# Patient Record
Sex: Male | Born: 1935 | Race: White | Hispanic: No | Marital: Married | Smoking: Never smoker
Health system: Southern US, Community
[De-identification: ages and names within clinical notes are randomized; demographics above are authoritative.]

## PROBLEM LIST (undated history)

## (undated) DIAGNOSIS — R413 Other amnesia: Secondary | ICD-10-CM

## (undated) DIAGNOSIS — J849 Interstitial pulmonary disease, unspecified: Secondary | ICD-10-CM

## (undated) DIAGNOSIS — G459 Transient cerebral ischemic attack, unspecified: Secondary | ICD-10-CM

## (undated) DIAGNOSIS — I2510 Atherosclerotic heart disease of native coronary artery without angina pectoris: Secondary | ICD-10-CM

## (undated) DIAGNOSIS — I10 Essential (primary) hypertension: Secondary | ICD-10-CM

## (undated) DIAGNOSIS — F0280 Dementia in other diseases classified elsewhere without behavioral disturbance: Secondary | ICD-10-CM

## (undated) DIAGNOSIS — I480 Paroxysmal atrial fibrillation: Secondary | ICD-10-CM

## (undated) DIAGNOSIS — Z9989 Dependence on other enabling machines and devices: Secondary | ICD-10-CM

## (undated) DIAGNOSIS — I429 Cardiomyopathy, unspecified: Secondary | ICD-10-CM

## (undated) DIAGNOSIS — I472 Ventricular tachycardia: Secondary | ICD-10-CM

## (undated) DIAGNOSIS — I951 Orthostatic hypotension: Secondary | ICD-10-CM

## (undated) DIAGNOSIS — Z9981 Dependence on supplemental oxygen: Secondary | ICD-10-CM

## (undated) DIAGNOSIS — N183 Chronic kidney disease, stage 3 unspecified: Secondary | ICD-10-CM

## (undated) DIAGNOSIS — I4729 Other ventricular tachycardia: Secondary | ICD-10-CM

## (undated) DIAGNOSIS — R55 Syncope and collapse: Secondary | ICD-10-CM

## (undated) DIAGNOSIS — I428 Other cardiomyopathies: Secondary | ICD-10-CM

## (undated) DIAGNOSIS — G309 Alzheimer's disease, unspecified: Secondary | ICD-10-CM

## (undated) DIAGNOSIS — I509 Heart failure, unspecified: Secondary | ICD-10-CM

## (undated) HISTORY — DX: Atherosclerotic heart disease of native coronary artery without angina pectoris: I25.10

## (undated) HISTORY — DX: Paroxysmal atrial fibrillation: I48.0

## (undated) HISTORY — DX: Cardiomyopathy, unspecified: I42.9

## (undated) HISTORY — PX: LOOP RECORDER IMPLANT: SHX5954

## (undated) HISTORY — DX: Essential (primary) hypertension: I10

## (undated) HISTORY — PX: CATARACT EXTRACTION: SUR2

## (undated) HISTORY — PX: COLONOSCOPY: SHX174

---

## 2012-05-01 ENCOUNTER — Encounter: Payer: Self-pay | Admitting: Cardiology

## 2012-05-22 ENCOUNTER — Other Ambulatory Visit (HOSPITAL_COMMUNITY): Payer: Self-pay | Admitting: Cardiovascular Disease

## 2012-05-22 DIAGNOSIS — I519 Heart disease, unspecified: Secondary | ICD-10-CM

## 2012-06-01 ENCOUNTER — Ambulatory Visit (HOSPITAL_COMMUNITY): Payer: Self-pay

## 2012-06-01 ENCOUNTER — Ambulatory Visit (HOSPITAL_COMMUNITY)
Admission: RE | Admit: 2012-06-01 | Discharge: 2012-06-01 | Disposition: A | Discharge status: Home / Self Care | Payer: Medicare Other | Source: Ambulatory Visit | Attending: Cardiovascular Disease | Admitting: Cardiovascular Disease

## 2012-06-01 DIAGNOSIS — I519 Heart disease, unspecified: Secondary | ICD-10-CM | POA: Insufficient documentation

## 2012-06-01 NOTE — Progress Notes (Signed)
[facility]   2D echo completed 06/01/2012.   Cindy Suriah Peragine, RDCS   

## 2013-05-02 ENCOUNTER — Telehealth: Payer: Self-pay | Admitting: Cardiovascular Disease

## 2013-05-02 ENCOUNTER — Encounter: Payer: Self-pay | Admitting: Cardiovascular Disease

## 2013-05-02 ENCOUNTER — Ambulatory Visit (INDEPENDENT_AMBULATORY_CARE_PROVIDER_SITE_OTHER): Payer: Medicare Other | Admitting: Cardiovascular Disease

## 2013-05-02 VITALS — BP 122/96 | HR 77 | Ht 68.5 in | Wt 176.0 lb

## 2013-05-02 DIAGNOSIS — E785 Hyperlipidemia, unspecified: Secondary | ICD-10-CM

## 2013-05-02 DIAGNOSIS — Z79899 Other long term (current) drug therapy: Secondary | ICD-10-CM

## 2013-05-02 DIAGNOSIS — I10 Essential (primary) hypertension: Secondary | ICD-10-CM

## 2013-05-02 MED ORDER — LISINOPRIL 10 MG PO TABS: 10.0000 mg | ORAL_TABLET | Freq: Every day | ORAL | Status: DC

## 2013-05-02 NOTE — Telephone Encounter (Signed)
Calling back regarding pharmacy  [facility]  [phone].  Samara Deist requested this info

## 2013-05-02 NOTE — Telephone Encounter (Signed)
Returning your call. °

## 2013-05-02 NOTE — Patient Instructions (Signed)
  Your physician wants you to follow-up with him in : 1 year with Dr Allyson Sabal                                            and with our pharmacist, Belenda Cruise, in 1 month for your blood pressure follow up                     You will receive a reminder letter in the mail one month in advance. If you don't receive a letter, please call our office to schedule the follow-up appointment.   Your physician recommends that you return for lab work in: 3 weeks, fasting   Your physician has recommended you make the following change in your medication: restart lisinopril 10mg  daily

## 2013-05-02 NOTE — Assessment & Plan Note (Signed)
Patient was on lisinopril 10 mg last year. His prescription expired and he failed to renew this. His blood pressure is elevated today at 122/96. He does complain of chronic dizziness and disequilibrium which I do not think is related to a cardiovascular cause. #3 start his lisinopril check a BMET in 3 weeks. He'll followup with our [facility], in one month for blood pressure and laboratory check.

## 2013-05-02 NOTE — Telephone Encounter (Signed)
Message forwarded to K. Vogel, RN.  

## 2013-05-02 NOTE — Telephone Encounter (Signed)
Prescription called into [facility] in [place]

## 2013-05-02 NOTE — Progress Notes (Signed)
05/02/2013 Alejandro [name]   April 23, 1933  [id number]  Primary Physician Ignatius Specking., MD Primary Cardiologist: Runell Gess MD Alejandro [name]   HPI:  The patient is a delightful 79 year old, fit appearing married Caucasian male whose wife is also a patient of mine. He is the father of 2, grandfather to 6 grandchildren. 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 He was referred to me by Dr. Sherril Croon because of presyncope and LV dysfunction on Lexiscan Myoview stress testing.   His cardiac risk factor profile is positive for recently diagnosed and treated hypertension. His father died of an MI in his 48s. He does not smoke, drink, nor is he diabetic or hyperlipidemic. He has never had a heart attack or stroke and denies chest pain or shortness of breath. He had 2 episodes of dizziness for which he went to [facility]. He stayed 3 hours and left and went to see Dr. Sherril Croon several days later who ordered an echo and a Myoview. The Myoview showed an EF of 30% and the echo showed an EF of 40-45% with inferior wall motion abnormality. He denies chest pain or shortness of breath. His major complaint is a chronic chronic dizziness at this point..    Current Outpatient Prescriptions  Medication Sig Dispense Refill  . ciprofloxacin (CIPRO) 250 MG tablet Take 250 mg by mouth 2 (two) times daily. Twice a day for 7 days then 1 tablet daily.  Just started this on 04/30/13      . lisinopril (PRINIVIL,ZESTRIL) 10 MG tablet Take 1 tablet (10 mg total) by mouth daily.  90 tablet  3   No current facility-administered medications for this visit.    Not on File  History   Social History  . Marital Status: Married    Spouse Name: N/A    Number of Children: N/A  . Years of Education: N/A   Occupational History  . Not on file.   Social History Main Topics  . Smoking status: Never Smoker   . Smokeless tobacco: Not on file  . Alcohol Use:  Not on file  . Drug Use: Not on file  . Sexual Activity: Not on file   Other Topics Concern  . Not on file   Social History Narrative  . No narrative on file     Review of Systems: General: negative for chills, fever, night sweats or weight changes.  Cardiovascular: negative for chest pain, dyspnea on exertion, edema, orthopnea, palpitations, paroxysmal nocturnal dyspnea or shortness of breath Dermatological: negative for rash Respiratory: negative for cough or wheezing Urologic: negative for hematuria Abdominal: negative for nausea, vomiting, diarrhea, bright red blood per rectum, melena, or hematemesis Neurologic: negative for visual changes, syncope, or dizziness All other systems reviewed and are otherwise negative except as noted above.    Blood pressure 122/96, pulse 77, height 5' 8.5" (1.74 m), weight 176 lb (79.833 kg).  General appearance: alert and no distress Neck: no adenopathy, no carotid bruit, no JVD, supple, symmetrical, trachea midline and thyroid not enlarged, symmetric, no tenderness/mass/nodules Lungs: clear to auscultation bilaterally Heart: regular rate and rhythm, S1, S2 normal, no murmur, click, rub or gallop Extremities: extremities normal, atraumatic, no cyanosis or edema  EKG normal sinus rhythm at 77 with poor R wave progression, lateral T-wave inversion and left anterior fascicular block.  ASSESSMENT AND PLAN:   Essential hypertension Patient was on lisinopril 10 mg last year. His prescription expired  and he failed to renew this. His blood pressure is elevated today at 122/96. He does complain of chronic dizziness and disequilibrium which I do not think is related to a cardiovascular cause. #3 start his lisinopril check a BMET in 3 weeks. He'll followup with our [facility], in one month for blood pressure and laboratory check.      Runell Gess MD FACP,FACC,FAHA, [facility] 05/02/2013 9:41 AM

## 2013-05-03 ENCOUNTER — Encounter: Payer: Self-pay | Admitting: Cardiovascular Disease

## 2013-05-21 LAB — BASIC METABOLIC PANEL
BUN: 15 mg/dL (ref 6–23)
Chloride: 105 mEq/L (ref 96–112)
Glucose, Bld: 91 mg/dL (ref 70–99)
Potassium: 4.3 mEq/L (ref 3.5–5.3)

## 2013-05-21 LAB — HEPATIC FUNCTION PANEL
ALT: 11 U/L (ref 0–53)
AST: 20 U/L (ref 0–37)
Albumin: 4.1 g/dL (ref 3.5–5.2)
Bilirubin, Direct: 0.2 mg/dL (ref 0.0–0.3)

## 2013-05-21 LAB — LIPID PANEL
Cholesterol: 152 mg/dL (ref 0–200)
VLDL: 17 mg/dL (ref 0–40)

## 2013-05-28 ENCOUNTER — Encounter: Payer: Self-pay | Admitting: *Deleted

## 2013-06-04 ENCOUNTER — Ambulatory Visit (INDEPENDENT_AMBULATORY_CARE_PROVIDER_SITE_OTHER): Payer: Medicare Other | Admitting: Pharmacist Clinician (PhC)/ Clinical Pharmacy Specialist

## 2013-06-04 VITALS — BP 112/74 | HR 52 | Ht 68.0 in | Wt 177.7 lb

## 2013-06-04 DIAGNOSIS — I10 Essential (primary) hypertension: Secondary | ICD-10-CM

## 2013-06-04 NOTE — Patient Instructions (Addendum)
Your blood pressure today is good at  112/74  Check your blood pressure at home a couple of times per week - watch the bottom (diastolic) number, should stay less than 90  Take your BP meds as follows: lisinopril 5mg  (cut 10mg  tabs in half until gone, new prescription for 5mg  tabs at pharmacy   Exercise as you're able, try to walk approximately 30 minutes per day.  Keep salt intake to a minimum, especially watch canned and prepared boxed foods.  Eat more fresh fruits and vegetables and fewer canned items.  Avoid eating in fast food restaurants.    HOW TO TAKE YOUR BLOOD PRESSURE:   Rest 5 minutes before taking your blood pressure.    Don't smoke or drink caffeinated beverages for at least 30 minutes before.   Take your blood pressure before (not after) you eat.   Sit comfortably with your back supported and both feet on the floor (don't cross your legs).   Elevate your arm to heart level on a table or a desk.   Use the proper sized cuff. It should fit smoothly and snugly around your bare upper arm. There should be enough room to slip a fingertip under the cuff. The bottom edge of the cuff should be 1 inch above the crease of the elbow.   Ideally, take 3 measurements at one sitting and record the average.

## 2013-06-05 ENCOUNTER — Other Ambulatory Visit: Payer: Self-pay | Admitting: Pharmacist Clinician (PhC)/ Clinical Pharmacy Specialist

## 2013-06-05 MED ORDER — LISINOPRIL 5 MG PO TABS: 5.0000 mg | ORAL_TABLET | Freq: Every day | ORAL | Status: DC

## 2013-06-05 NOTE — Addendum Note (Signed)
Addended by: Erasmo Downer R on: 06/05/2013 04:13 PM   Modules accepted: Orders

## 2013-06-05 NOTE — Progress Notes (Signed)
     06/05/2013 Donathan Buller [birth year] [id number]   HPI:  Alejandro [name] is a 80 y.o. male patient of Dr Gwenlyn Found, with a [facility] below who presents today for a blood pressure check.  He was seen in December by Dr. Gwenlyn Found and at that time had quit taking his lisinopril 10mg  (prescription had expired, pt did not renew).  His BP was 122/96.  At that time he was restarted on the medication, currently his only med.  He states that in the past he was tried on 2 other medications for BP, both made him sick, but he cannot recall their names.  He has no known family history of hypertension,  He does not use tobacco products, drink alcohol or caffeine.  He does not add salt to his food, although he admits to eating out daily, including fast food.  He does not exercise regularly, although he stays active.  He does have a home BP cuff although he doesn't check his home readings regularly.   Current Outpatient Prescriptions  Medication Sig Dispense Refill  . lisinopril (PRINIVIL,ZESTRIL) 10 MG tablet Take 10 mg by mouth daily.       No current facility-administered medications for this visit.    Not on File  Past Medical History  Diagnosis Date  . Hypertension   . Dizziness     Blood pressure 112/74, pulse 52, height 5\' 8"  (1.727 m), weight 177 lb 11.2 oz (80.604 kg).  BP standing 100/80   ASSESSMENT AND PLAN:  Mr. Montano' blood pressure came down significantly (diastolic) with the addition of lisinopril 10mg .  However because of his age and thus increased fall risk, I am going to cut back his dose to just 5mg  daily.  I have asked that he check his home BP several times per week and especially watch the diastolic reading to be sure it stays <90.  He is to call the office if the readings increase to >72 diastolic.  I will see him again should the need arise, and I did encourage him to continue with the medication and not stop it in the future.  Tommy Medal PharmD CPP [facility]

## 2013-06-05 NOTE — Telephone Encounter (Signed)
[facility] is closed, need new pharmacy info.

## 2013-06-05 NOTE — Telephone Encounter (Signed)
Attempted to resend Rx and it still failed to send.  Call to pharmacy and number disconnected.  Erasmo Downer, PharmD notified and will look into it.

## 2013-06-06 MED ORDER — LISINOPRIL 5 MG PO TABS: 5.0000 mg | ORAL_TABLET | Freq: Every day | ORAL | Status: DC

## 2014-02-01 ENCOUNTER — Telehealth: Payer: Self-pay | Admitting: Cardiovascular Disease

## 2014-02-01 NOTE — Telephone Encounter (Signed)
Pt called in stating that he will be switching cardiologist and would like to come and pick up his medical records. Please call  Thanks

## 2014-02-13 NOTE — Telephone Encounter (Signed)
Has this been handled?

## 2014-02-19 NOTE — Telephone Encounter (Signed)
All records were copied and faxed to [facility] on 02/14/14  Transferring care to [facility]

## 2014-03-31 HISTORY — PX: LAPAROSCOPIC APPENDECTOMY: SUR753

## 2014-04-01 ENCOUNTER — Encounter: Payer: Self-pay | Admitting: Cardiology

## 2014-06-04 ENCOUNTER — Encounter: Payer: Self-pay | Admitting: Cardiology

## 2014-06-04 ENCOUNTER — Ambulatory Visit (INDEPENDENT_AMBULATORY_CARE_PROVIDER_SITE_OTHER): Payer: Medicare Other | Admitting: Cardiology

## 2014-06-04 DIAGNOSIS — I429 Cardiomyopathy, unspecified: Secondary | ICD-10-CM

## 2014-06-04 NOTE — Progress Notes (Signed)
   Reason for visit: Cardiomyopathy, hypertension  Clinical Summary Mr. Lybbert is a 81 y.o.male presenting to the office for a routine visit, a former patient of Dr. Gwenlyn Found who was last seen in December 2014. This is our first meeting. I reviewed the available records.  He presents today without any specific complaints, no exertional chest pain, NYHA class 1-2 dyspnea, no palpitations or syncope. He is no longer taking any medications, was previously on an ACE inhibitor. I reviewed with him his history of cardiomyopathy based on prior testing.  He seemed aware of this, but has generally preferred a conservative approach overall. He states that he remains active, does some part-time Forensic psychologist, having done construction most recently. He is retired from the [employer].  Echocardiogram from January 2014 reported LVEF 40-45% with mid to basal inferior hypokinesis, grade 1 diastolic dysfunction, trivial aortic regurgitation, mild left atrial enlargement. Prior ischemic workup via Cardiolite in December 2013 showed no active ischemia with LVEF 30%, and inferior scar versus attenuation.   No Known Allergies  No current outpatient prescriptions on file.   No current facility-administered medications for this visit.    Past Medical History  Diagnosis Date  . Essential hypertension   . Secondary cardiomyopathy     LVEF 40-45% January 2014    Past Surgical History  Procedure Laterality Date  . Laparoscopic appendectomy  November 2015    Dr. [name]    Family History  Problem Relation Age of Onset  . Heart disease Father     Diagnosed in his 2s    Social History Mr. Kurka reports that he has never smoked. He has never used smokeless tobacco. Mr. Saffran reports that he does not drink alcohol.  Review of Systems Complete review of systems negative except as otherwise outlined in the clinical summary and also the following.  No palpitations or syncope. Reports good appetite.  No orthopnea or PND.  Physical Examination Filed Vitals:   06/04/14 1306  BP: 110/79  Pulse: 65   Filed Weights   06/04/14 1306  Weight: 183 lb (83.008 kg)   He appears comfortable at rest. HEENT: Conjunctiva and lids normal, oropharynx clear. Neck: Supple, no elevated JVP or carotid bruits, no thyromegaly. Lungs: Clear to auscultation, nonlabored breathing at rest. Cardiac: Regular rate and rhythm, no S3 or significant systolic murmur, no pericardial rub. Abdomen: Soft, nontender, bowel sounds present, no guarding or rebound. Extremities: No pitting edema, distal pulses 2+. Skin: Warm and dry. Musculoskeletal: No kyphosis. Neuropsychiatric: Alert and oriented x3, affect grossly appropriate.   Problem List and Plan   Secondary cardiomyopathy Documented by previous testing, LVEF 40-45% as of January 2014. Currently he is not taking any medications, has preferred a conservative approach. He is symptomatically stable at this time. I have suggested that we follow up with an echocardiogram and then make further decisions about medication recommendations.  Essential hypertension Based on history. Blood pressure is normal today.    Satira Sark, M.D., F.A.C.C.

## 2014-06-04 NOTE — Patient Instructions (Signed)
Your physician wants you to follow-up in: 1 year with Dr. Ferne Reus will receive a reminder letter in the mail two months in advance. If you don't receive a letter, please call our office to schedule the follow-up appointment.  Your physician has requested that you have an echocardiogram. Echocardiography is a painless test that uses sound waves to create images of your heart. It provides your doctor with information about the size and shape of your heart and how well your heart's chambers and valves are working. This procedure takes approximately one hour. There are no restrictions for this procedure.  Your physician recommends that you continue on your current medications as directed. Please refer to the Current Medication list given to you today.  Thank you for choosing [facility]!!

## 2014-06-04 NOTE — Assessment & Plan Note (Signed)
Based on history. Blood pressure is normal today.

## 2014-06-04 NOTE — Assessment & Plan Note (Signed)
Documented by previous testing, LVEF 40-45% as of January 2014. Currently he is not taking any medications, has preferred a conservative approach. He is symptomatically stable at this time. I have suggested that we follow up with an echocardiogram and then make further decisions about medication recommendations.

## 2014-06-06 ENCOUNTER — Other Ambulatory Visit: Payer: Self-pay

## 2014-06-06 ENCOUNTER — Telehealth: Payer: Self-pay | Admitting: *Deleted

## 2014-06-06 ENCOUNTER — Other Ambulatory Visit (INDEPENDENT_AMBULATORY_CARE_PROVIDER_SITE_OTHER): Payer: Medicare Other

## 2014-06-06 DIAGNOSIS — I429 Cardiomyopathy, unspecified: Secondary | ICD-10-CM

## 2014-06-06 DIAGNOSIS — I10 Essential (primary) hypertension: Secondary | ICD-10-CM

## 2014-06-06 NOTE — Telephone Encounter (Signed)
-----   Message from Satira Sark, MD sent at 06/06/2014  3:37 PM EST ----- Reviewed. Please let him know that LVEF is in low normal range, actually somewhat better than previously assessed. We should continue to keep an eye on him, but if he prefers conservative overall approach, no medications will be added at this time. He should keep interval follow-up with Dr. Woody Seller.

## 2014-06-06 NOTE — Telephone Encounter (Signed)
Patient informed and verbalized understanding of plan. 

## 2014-07-23 ENCOUNTER — Ambulatory Visit (INDEPENDENT_AMBULATORY_CARE_PROVIDER_SITE_OTHER): Payer: Medicare Other | Admitting: Cardiology

## 2014-07-23 ENCOUNTER — Telehealth: Payer: Self-pay | Admitting: Cardiology

## 2014-07-23 ENCOUNTER — Encounter: Payer: Self-pay | Admitting: *Deleted

## 2014-07-23 ENCOUNTER — Encounter: Payer: Self-pay | Admitting: Cardiology

## 2014-07-23 VITALS — BP 115/79 | HR 73 | Ht 68.0 in | Wt 182.4 lb

## 2014-07-23 DIAGNOSIS — I10 Essential (primary) hypertension: Secondary | ICD-10-CM

## 2014-07-23 DIAGNOSIS — I429 Cardiomyopathy, unspecified: Secondary | ICD-10-CM

## 2014-07-23 DIAGNOSIS — R55 Syncope and collapse: Secondary | ICD-10-CM

## 2014-07-23 DIAGNOSIS — I2510 Atherosclerotic heart disease of native coronary artery without angina pectoris: Secondary | ICD-10-CM

## 2014-07-23 MED ORDER — ASPIRIN EC 81 MG PO TBEC: 81.0000 mg | DELAYED_RELEASE_TABLET | Freq: Every day | ORAL | Status: DC

## 2014-07-23 NOTE — Progress Notes (Signed)
Cardiology Office Note  Date: 07/23/2014   ID: Alejandro [name], DOB 1932-09-29, MRN [id number]  PCP: Glenda Chroman., MD  Primary Cardiologist: Rozann Lesches, MD   Chief Complaint  Patient presents with  . Hospitalization Follow-up  . History of cardiomyopathy    History of Present Illness: Alejandro [name] is a 81 y.o. male that I just recently met for the first time in clinic in January for a routine visit. He is referred to the office today by Dr. Woody Seller after recent hospitalization at [facility]. Records indicate admission for evaluation of a syncopal event. I reviewed the available records, testing outlined below. There is no discharge summary.  He is here with his wife today. He states that over the weekend he had an episode of sudden dizziness and sinking to the ground, preceded by feeling of "numbness" in his right lower leg, and also a feeling of blurriness in his right visual field. He had just carried some buckets full of dirt to put in his truck, and was preparing to drive to another area. He had another similar event while standing outside of his truck and came to the ER thinking that he may of had a stroke or TIA.  It is not clear that he had a neuroimaging based on review of the records. He states that the right leg numbness and visual changes were brief, lasted for only a period of minutes. I do not have access to his telemetry findings. ECG did show sinus rhythm with PACs.  He states that he has not had any similar symptoms to this in the past. Also denies exertional chest pain. We just had a recent echocardiogram done to update LVEF, showing improvement as outlined below. He has not undergone any recent ischemic testing, with prior evaluation in 2013 demonstrating inferior scar versus attenuation. Of note, CT angiogram of the chest done during the recent hospital stay did demonstrate coronary atherosclerosis incidentally. The question of clinical significance of potential left  lower lung infiltrate and small effusion remains. He does state that a week or so ago he was having trouble with cough and chest congestion around the time he was diagnosed with a UTI. I suppose it is possible that he had an outpatient pneumonia.  He does not describe any recent palpitations.   Past Medical History  Diagnosis Date  . Essential hypertension   . Secondary cardiomyopathy     LVEF 40-45% January 2014    Past Surgical History  Procedure Laterality Date  . Laparoscopic appendectomy  November 2015    Dr. [name]    Current Outpatient Prescriptions  Medication Sig Dispense Refill  . aspirin EC 81 MG tablet Take 1 tablet (81 mg total) by mouth daily. 90 tablet 3  . furosemide (LASIX) 20 MG tablet Take 20 mg by mouth daily.    . potassium chloride (K-DUR) 10 MEQ tablet Take 10 mEq by mouth daily.     No current facility-administered medications for this visit.    Allergies:  Review of patient's allergies indicates no known allergies.   Social History: The patient  reports that he has never smoked. He has never used smokeless tobacco. He reports that he does not drink alcohol or use illicit drugs.   Family History: The patient's family history includes Heart disease in his father.   ROS:  Please see the history of present illness. Otherwise, complete review of systems is positive for none.  All other systems are reviewed and negative.  Physical Exam: VS:  BP 115/79 mmHg  Pulse 73  Ht _0  (1.727 m)  Wt 182 lb 6.4 oz (82.736 kg)  BMI 27.74 kg/m2  SpO2 97%, BMI Body mass index is 27.74 kg/(m^2).  Wt Readings from Last 3 Encounters:  07/23/14 182 lb 6.4 oz (82.736 kg)  06/04/14 183 lb (83.008 kg)  06/05/13 177 lb 11.2 oz (80.604 kg)     He appears comfortable at rest. HEENT: Conjunctiva and lids normal, oropharynx clear. Neck: Supple, no elevated JVP or carotid bruits, no thyromegaly. Lungs: Clear to auscultation, nonlabored breathing at rest. Cardiac:  Regular rate and rhythm with intermittent ectopy, no S3 or significant systolic murmur, no pericardial rub. Abdomen: Soft, nontender, bowel sounds present, no guarding or rebound. Extremities: No pitting edema, distal pulses 2+. Skin: Warm and dry. Musculoskeletal: No kyphosis. Neuropsychiatric: Alert and oriented x3, affect grossly appropriate.   ECG: ECG is not ordered today.  Recent Labwork:  Lab work from recent hospitalization at [facility] showed BNP 509, BUN 16, creatinine 0.9, d-dimer 0.92, AST 23, ALT 16, hemoglobin 13.5, platelets 327, potassium 3.6, sodium 139, troponin I 0.04, WBC 7.6, magnesium 2.1  Other Studies Reviewed Today:  1. Echocardiogram 06/06/2014: Study Conclusions  - Left ventricle: The cavity size was normal. Wall thickness was increased in a pattern of mild LVH. Systolic function was normal. The estimated ejection fraction was in the range of 50% to 55%. Probable hypokinesis of the basal-midinferolateral myocardium. Doppler parameters are consistent with abnormal left ventricular relaxation (grade 1 diastolic dysfunction). - Aortic valve: Trileaflet; mildly calcified leaflets width Lambl&'s excrescences. There was trivial regurgitation. - Aortic root: The aortic root was mildly ectatic. - Mitral valve: Calcified annulus. There was trivial regurgitation. - Right atrium: Central venous pressure (est): 3 mm Hg. - Tricuspid valve: There was trivial regurgitation. - Pulmonary arteries: PA peak pressure: 27 mm Hg (S). - Pericardium, extracardiac: There was no pericardial effusion.  Impressions:  - Mild LVH with LVEF approximately 50-55% in the setting of frequent ventricular ectopy. There is mid to basal inferolateral hypokinesis and grade 1 diastolic dysfunction. Sclerotic aortic valve with trivial aortic regurgitation. Mildly ectatic aortic root. Trivial tricuspid regurgitation with PASP 27 mmHg.  2. Cardiolite in December 2013 showed  no active ischemia with LVEF 30%, and inferior scar versus attenuation.  3. Chest CT angiogram from 07/20/2014 reported dense atelectasis versus pneumonia in the posterior aspect of the left lower lobe, small left pleural effusion, no pulmonary emboli, multiple calcified granulomata in both lung fields, no adenopathy, incidentally noted coronary atherosclerosis.  4. ECG from 07/20/2014 showed sinus rhythm with PACs, prolonged PR interval, overall low voltage.  Assessment and Plan:  1. Recent episodes of syncope as outlined above, etiology is not yet clear. I reviewed the available information from [place]. At this time I have recommended that he start taking an aspirin 81 mg daily in case he did in fact have a TIA. This is also in the setting of documented coronary atherosclerosis by chest CT imaging. I do not have access to his telemetry monitoring, but ECG showed sinus rhythm with PACs, not entirely clear that an arrhythmia was involved. He does need further workup for ischemic heart disease and we discussed proceeding with an exercise Cardiolite for further evaluation. As already discussed, his echocardiogram showed improvement in LVEF just recently to the range of 50-55%. Will bring him back to the office to discuss the results.  2. History of cardiomyopathy with LVEF as low as 30% as of  noninvasive testing in 2013, felt to be nonischemic at that time.  3. History of essential hypertension, blood pressure is normal today.  Current medicines are reviewed at length with the patient today.  The patient does not have concerns regarding medicines.   Orders Placed This Encounter  Procedures  . NM Myocar Multi W/Spect W/Wall Motion / EF  . Myocardial Perfusion Imaging    Disposition: FU with me in 2 weeks.   Signed, Satira Sark, MD, [facility] 07/23/2014 3:18 PM    [facility] [place], [place], [place] [address] Phone: [phone]; Fax: [phone]

## 2014-07-23 NOTE — Patient Instructions (Signed)
Your physician recommends that you schedule a follow-up appointment in: 2 weeks. Your physician has recommended you make the following change in your medication:  Start aspirin 81 mg daily. Continue other medications the same until they are finished. Your physician has requested that you have en exercise stress myoview. For further information please visit [email or web address]. Please follow instruction sheet, as given.  Thank you for choosing [facility]!

## 2014-07-23 NOTE — Telephone Encounter (Signed)
Exercise Cardiolite on medications (ASAP) [id number] and CAD Schedule at [place] Jul 29, 2014 register @ 8:45

## 2014-07-24 NOTE — Telephone Encounter (Signed)
MCR, [employer] SUP NO PAC RQD

## 2014-07-29 ENCOUNTER — Encounter (HOSPITAL_COMMUNITY): Payer: Medicare Other

## 2014-07-29 ENCOUNTER — Ambulatory Visit (HOSPITAL_COMMUNITY): Payer: Medicare Other

## 2014-07-29 ENCOUNTER — Inpatient Hospital Stay (HOSPITAL_COMMUNITY): Admission: RE | Admit: 2014-07-29 | Payer: Medicare Other | Source: Ambulatory Visit

## 2014-07-30 ENCOUNTER — Encounter (HOSPITAL_COMMUNITY): Payer: Self-pay

## 2014-07-30 ENCOUNTER — Encounter (HOSPITAL_COMMUNITY)
Admission: RE | Admit: 2014-07-30 | Discharge: 2014-07-30 | Disposition: A | Discharge status: Home / Self Care | Payer: Medicare Other | Source: Ambulatory Visit | Attending: Cardiology | Admitting: Cardiology

## 2014-07-30 ENCOUNTER — Ambulatory Visit (HOSPITAL_COMMUNITY)
Admission: RE | Admit: 2014-07-30 | Discharge: 2014-07-30 | Disposition: A | Discharge status: Home / Self Care | Payer: Medicare Other | Source: Ambulatory Visit | Attending: Cardiology | Admitting: Cardiology

## 2014-07-30 ENCOUNTER — Inpatient Hospital Stay (HOSPITAL_COMMUNITY)
Admission: AD | Admit: 2014-07-30 | Discharge: 2014-08-06 | DRG: 287 | Disposition: A | Discharge status: Home / Self Care | Payer: Medicare Other | Source: Ambulatory Visit | Attending: Cardiovascular Disease | Admitting: Cardiovascular Disease

## 2014-07-30 DIAGNOSIS — I4891 Unspecified atrial fibrillation: Secondary | ICD-10-CM

## 2014-07-30 DIAGNOSIS — I5022 Chronic systolic (congestive) heart failure: Secondary | ICD-10-CM | POA: Diagnosis not present

## 2014-07-30 DIAGNOSIS — I495 Sick sinus syndrome: Secondary | ICD-10-CM | POA: Diagnosis present

## 2014-07-30 DIAGNOSIS — R55 Syncope and collapse: Secondary | ICD-10-CM | POA: Diagnosis not present

## 2014-07-30 DIAGNOSIS — E785 Hyperlipidemia, unspecified: Secondary | ICD-10-CM | POA: Diagnosis present

## 2014-07-30 DIAGNOSIS — R42 Dizziness and giddiness: Secondary | ICD-10-CM | POA: Diagnosis present

## 2014-07-30 DIAGNOSIS — I472 Ventricular tachycardia: Secondary | ICD-10-CM

## 2014-07-30 DIAGNOSIS — I4729 Other ventricular tachycardia: Secondary | ICD-10-CM

## 2014-07-30 DIAGNOSIS — I2510 Atherosclerotic heart disease of native coronary artery without angina pectoris: Secondary | ICD-10-CM | POA: Diagnosis present

## 2014-07-30 DIAGNOSIS — I10 Essential (primary) hypertension: Secondary | ICD-10-CM | POA: Diagnosis present

## 2014-07-30 DIAGNOSIS — Z7982 Long term (current) use of aspirin: Secondary | ICD-10-CM | POA: Diagnosis not present

## 2014-07-30 DIAGNOSIS — Z7901 Long term (current) use of anticoagulants: Secondary | ICD-10-CM | POA: Diagnosis not present

## 2014-07-30 DIAGNOSIS — I429 Cardiomyopathy, unspecified: Secondary | ICD-10-CM

## 2014-07-30 DIAGNOSIS — I420 Dilated cardiomyopathy: Secondary | ICD-10-CM | POA: Diagnosis present

## 2014-07-30 DIAGNOSIS — I2584 Coronary atherosclerosis due to calcified coronary lesion: Secondary | ICD-10-CM | POA: Diagnosis present

## 2014-07-30 DIAGNOSIS — Z8673 Personal history of transient ischemic attack (TIA), and cerebral infarction without residual deficits: Secondary | ICD-10-CM

## 2014-07-30 DIAGNOSIS — I255 Ischemic cardiomyopathy: Secondary | ICD-10-CM | POA: Diagnosis not present

## 2014-07-30 DIAGNOSIS — G459 Transient cerebral ischemic attack, unspecified: Secondary | ICD-10-CM | POA: Diagnosis not present

## 2014-07-30 DIAGNOSIS — I493 Ventricular premature depolarization: Secondary | ICD-10-CM | POA: Diagnosis not present

## 2014-07-30 DIAGNOSIS — R001 Bradycardia, unspecified: Secondary | ICD-10-CM | POA: Diagnosis present

## 2014-07-30 LAB — CBC WITH DIFFERENTIAL/PLATELET
Basophils Absolute: 0.1 10*3/uL (ref 0.0–0.1)
Basophils Relative: 1 % (ref 0–1)
EOS ABS: 0.5 10*3/uL (ref 0.0–0.7)
EOS PCT: 6 % — AB (ref 0–5)
HCT: 44.6 % (ref 39.0–52.0)
Hemoglobin: 14.8 g/dL (ref 13.0–17.0)
LYMPHS ABS: 2.2 10*3/uL (ref 0.7–4.0)
Lymphocytes Relative: 24 % (ref 12–46)
MCH: 31.8 pg (ref 26.0–34.0)
MCHC: 33.2 g/dL (ref 30.0–36.0)
MCV: 95.9 fL (ref 78.0–100.0)
MONO ABS: 1 10*3/uL (ref 0.1–1.0)
Monocytes Relative: 11 % (ref 3–12)
Neutro Abs: 5.5 10*3/uL (ref 1.7–7.7)
Neutrophils Relative %: 58 % (ref 43–77)
Platelets: 232 10*3/uL (ref 150–400)
RBC: 4.65 MIL/uL (ref 4.22–5.81)
RDW: 14.2 % (ref 11.5–15.5)
WBC: 9.3 10*3/uL (ref 4.0–10.5)

## 2014-07-30 LAB — COMPREHENSIVE METABOLIC PANEL
ALT: 14 U/L (ref 0–53)
AST: 21 U/L (ref 0–37)
Albumin: 3.7 g/dL (ref 3.5–5.2)
Alkaline Phosphatase: 46 U/L (ref 39–117)
Anion gap: 2 — ABNORMAL LOW (ref 5–15)
BUN: 22 mg/dL (ref 6–23)
CO2: 29 mmol/L (ref 19–32)
Calcium: 9.1 mg/dL (ref 8.4–10.5)
Chloride: 106 mmol/L (ref 96–112)
Creatinine, Ser: 1.23 mg/dL (ref 0.50–1.35)
GFR, EST AFRICAN AMERICAN: 63 mL/min — AB (ref >90)
GFR, EST NON AFRICAN AMERICAN: 54 mL/min — AB (ref >90)
Glucose, Bld: 125 mg/dL — ABNORMAL HIGH (ref 70–99)
Potassium: 4.2 mmol/L (ref 3.5–5.1)
SODIUM: 137 mmol/L (ref 135–145)
Total Bilirubin: 1.1 mg/dL (ref 0.3–1.2)
Total Protein: 7 g/dL (ref 6.0–8.3)

## 2014-07-30 LAB — TSH: TSH: 1.989 u[IU]/mL (ref 0.350–4.500)

## 2014-07-30 LAB — MAGNESIUM: MAGNESIUM: 1.9 mg/dL (ref 1.5–2.5)

## 2014-07-30 LAB — PROTIME-INR
INR: 1.09 (ref 0.00–1.49)
Prothrombin Time: 14.2 seconds (ref 11.6–15.2)

## 2014-07-30 LAB — MRSA PCR SCREENING: MRSA by PCR: NEGATIVE

## 2014-07-30 MED ORDER — HEPARIN (PORCINE) IN NACL 100-0.45 UNIT/ML-% IJ SOLN
1100.0000 [IU]/h | INTRAMUSCULAR | Status: DC
  Administered 2014-07-30 – 2014-07-31 (×3): 1100 [IU]/h via INTRAVENOUS
  Filled 2014-07-30 (×2): qty 250

## 2014-07-30 MED ORDER — ACETAMINOPHEN 325 MG PO TABS: 650.0000 mg | ORAL_TABLET | Freq: Four times a day (QID) | ORAL | Status: DC | PRN

## 2014-07-30 MED ORDER — TECHNETIUM TC 99M SESTAMIBI GENERIC - CARDIOLITE
30.0000 | Freq: Once | INTRAVENOUS | Status: AC | PRN
  Administered 2014-07-30: 30 via INTRAVENOUS

## 2014-07-30 MED ORDER — SODIUM CHLORIDE 0.9 % IJ SOLN
3.0000 mL | Freq: Two times a day (BID) | INTRAMUSCULAR | Status: DC
  Administered 2014-08-01 – 2014-08-06 (×10): 3 mL via INTRAVENOUS

## 2014-07-30 MED ORDER — TECHNETIUM TC 99M SESTAMIBI - CARDIOLITE
10.0000 | Freq: Once | INTRAVENOUS | Status: AC | PRN
  Administered 2014-07-30: 09:00:00 10 via INTRAVENOUS

## 2014-07-30 MED ORDER — REGADENOSON 0.4 MG/5ML IV SOLN
0.4000 mg | Freq: Once | INTRAVENOUS | Status: AC | PRN
  Administered 2014-07-30: 0.4 mg via INTRAVENOUS

## 2014-07-30 MED ORDER — SODIUM CHLORIDE 0.9 % IV SOLN: 250.0000 mL | INTRAVENOUS | Status: DC | PRN

## 2014-07-30 MED ORDER — SENNOSIDES-DOCUSATE SODIUM 8.6-50 MG PO TABS
1.0000 | ORAL_TABLET | Freq: Every evening | ORAL | Status: DC | PRN
  Filled 2014-07-30: qty 1

## 2014-07-30 MED ORDER — ONDANSETRON HCL 4 MG PO TABS: 4.0000 mg | ORAL_TABLET | Freq: Four times a day (QID) | ORAL | Status: DC | PRN

## 2014-07-30 MED ORDER — AMIODARONE HCL IN DEXTROSE 360-4.14 MG/200ML-% IV SOLN
60.0000 mg/h | INTRAVENOUS | Status: DC
  Administered 2014-07-30: 60 mg/h via INTRAVENOUS

## 2014-07-30 MED ORDER — AMIODARONE HCL IN DEXTROSE 360-4.14 MG/200ML-% IV SOLN
INTRAVENOUS | Status: AC
  Filled 2014-07-30: qty 200

## 2014-07-30 MED ORDER — SODIUM CHLORIDE 0.9 % IJ SOLN
10.0000 mL | INTRAMUSCULAR | Status: DC | PRN
  Administered 2014-07-30: 10 mL via INTRAVENOUS
  Filled 2014-07-30: qty 10

## 2014-07-30 MED ORDER — AMIODARONE LOAD VIA INFUSION
150.0000 mg | Freq: Once | INTRAVENOUS | Status: AC
  Administered 2014-07-30: 150 mg via INTRAVENOUS
  Filled 2014-07-30: qty 83.34

## 2014-07-30 MED ORDER — SODIUM CHLORIDE 0.9 % IJ SOLN: 3.0000 mL | INTRAMUSCULAR | Status: DC | PRN

## 2014-07-30 MED ORDER — REGADENOSON 0.4 MG/5ML IV SOLN
INTRAVENOUS | Status: AC
  Administered 2014-07-30: 0.4 mg via INTRAVENOUS
  Filled 2014-07-30: qty 5

## 2014-07-30 MED ORDER — SODIUM CHLORIDE 0.9 % IJ SOLN
INTRAMUSCULAR | Status: AC
  Administered 2014-07-30: 10 mL via INTRAVENOUS
  Filled 2014-07-30: qty 3

## 2014-07-30 MED ORDER — ATROPINE SULFATE 1 MG/ML IJ SOLN
INTRAMUSCULAR | Status: AC
  Filled 2014-07-30: qty 1

## 2014-07-30 MED ORDER — AMIODARONE HCL IN DEXTROSE 360-4.14 MG/200ML-% IV SOLN: 30.0000 mg/h | INTRAVENOUS | Status: DC

## 2014-07-30 MED ORDER — ACETAMINOPHEN 650 MG RE SUPP: 650.0000 mg | Freq: Four times a day (QID) | RECTAL | Status: DC | PRN

## 2014-07-30 MED ORDER — AMIODARONE LOAD VIA INFUSION
150.0000 mg | Freq: Once | INTRAVENOUS | Status: DC
  Filled 2014-07-30: qty 83.34

## 2014-07-30 MED ORDER — ONDANSETRON HCL 4 MG/2ML IJ SOLN: 4.0000 mg | Freq: Four times a day (QID) | INTRAMUSCULAR | Status: DC | PRN

## 2014-07-30 MED ORDER — AMIODARONE HCL IN DEXTROSE 360-4.14 MG/200ML-% IV SOLN
30.0000 mg/h | INTRAVENOUS | Status: DC
  Administered 2014-07-31 – 2014-08-01 (×5): 30 mg/h via INTRAVENOUS
  Filled 2014-07-30 (×7): qty 200

## 2014-07-30 MED ORDER — OXYCODONE HCL 5 MG PO TABS: 5.0000 mg | ORAL_TABLET | ORAL | Status: DC | PRN

## 2014-07-30 MED ORDER — AMIODARONE HCL IN DEXTROSE 360-4.14 MG/200ML-% IV SOLN
60.0000 mg/h | INTRAVENOUS | Status: DC
  Administered 2014-07-30: 60 mg/h via INTRAVENOUS
  Filled 2014-07-30: qty 200

## 2014-07-30 MED ORDER — HEPARIN BOLUS VIA INFUSION
4000.0000 [IU] | Freq: Once | INTRAVENOUS | Status: AC
  Administered 2014-07-30: 4000 [IU] via INTRAVENOUS
  Filled 2014-07-30: qty 4000

## 2014-07-30 NOTE — Progress Notes (Signed)
ANTICOAGULATION CONSULT NOTE - Initial Consult  Pharmacy Consult for Heparin Indication: atrial fibrillation  No Known Allergies  Patient Measurements: Height: 5\' 8"  (172.7 cm) Weight: 173 lb 8 oz (78.7 kg) IBW/kg (Calculated) : 68.4 Heparin Dosing Weight: 78kg  Vital Signs: Temp: 98 F (36.7 C) (03/01 1312) Temp Source: Oral (03/01 1312) BP: 91/63 mmHg (03/01 1330)  Labs:  Recent Labs  07/30/14 1440  HGB 14.8  HCT 44.6  PLT 232  LABPROT 14.2  INR 1.09    CrCl cannot be calculated (Patient has no serum creatinine result on file.).   Medical History: Past Medical History  Diagnosis Date  . Essential hypertension   . Secondary cardiomyopathy     LVEF 40-45% January 2014    Medications:  Scheduled:  . amiodarone  150 mg Intravenous Once  . heparin  4,000 Units Intravenous Once    Assessment: 24 yoM who experienced syncope, found to be in Afib when presenting for stress test today.  Possible need for cardiac cath so plan to initiate heparin until need for intervention determined. CBC reviewed.  No bleeding noted.   Goal of Therapy:  Heparin level 0.3-0.7 units/ml Monitor platelets by anticoagulation protocol: Yes   Plan:  Give 4000 units bolus x 1 Start heparin infusion at 1100 units/hr Check anti-Xa level in 8 hours and daily while on heparin Continue to monitor H&H and platelets  F/U plan to transition to [place], Lavonia Drafts 07/30/2014,3:37 PM

## 2014-07-30 NOTE — Plan of Care (Signed)
Problem: Phase I Progression Outcomes Goal: Ventricular heart rate < 120/min Outcome: Progressing Pt currently on amiodarone gtt    Goal: Anticoagulation Therapy per MD order Outcome: Progressing On heparin gtt

## 2014-07-30 NOTE — H&P (Signed)
             [facility]          History and Physical    PCP:   [name],[name], MD   Chief Complaint:  Dizziness, arrhythmia during stress test  HPI: Patient is a 81-year-old white man with history significant for a recent hospitalization at [facility] for syncope, history of nonischemic cardiomyopathy with normalized LV systolic function in January 2016 with wall motion abnormalities suggestive of coronary artery disease. Per Dr. McDowell's recommendation presented today for a stress test. During stress test patient felt dizzy and lightheaded. His EKG demonstrated atrial fibrillation and nonsustained ventricular tachycardia with occasional 2.5 second pauses. Cardiology has recommended admission for amiodarone in hopes of converting back to normal sinus rhythm. They have also recommended initiation of heparin infusion and hold off on anticoagulation in case he would require coronary angiography in the near future. Patient does not take any chronic medications. He is completely asymptomatic and denies any chest pain, shortness of breath at present.  Allergies:  No Known Allergies    Past Medical History  Diagnosis Date  . Essential hypertension   . Secondary cardiomyopathy     LVEF 40-45% January 2014    Past Surgical History  Procedure Laterality Date  . Laparoscopic appendectomy  November 2015    Dr. DeMason    Prior to Admission medications   Medication Sig Start Date End Date Taking? Authorizing Provider  aspirin EC 81 MG tablet Take 1 tablet (81 mg total) by mouth daily. Patient not taking: Reported on 07/30/2014 07/23/14   Samuel G McDowell, MD    Social History:  reports that he has never smoked. He has never used smokeless tobacco. He reports that he does not drink alcohol or use illicit drugs.  Family History  Problem Relation Age of Onset  . Heart disease Father     Diagnosed in his 70s    Review of Systems:  Constitutional: Denies fever, chills,  diaphoresis, appetite change and fatigue.  HEENT: Denies photophobia, eye pain, redness, hearing loss, ear pain, congestion, sore throat, rhinorrhea, sneezing, mouth sores, trouble swallowing, neck pain, neck stiffness and tinnitus.   Respiratory: Denies SOB, DOE, cough, chest tightness,  and wheezing.   Cardiovascular: Denies chest pain, palpitations and leg swelling.  Gastrointestinal: Denies nausea, vomiting, abdominal pain, diarrhea, constipation, blood in stool and abdominal distention.  Genitourinary: Denies dysuria, urgency, frequency, hematuria, flank pain and difficulty urinating.  Endocrine: Denies: hot or cold intolerance, sweats, changes in hair or nails, polyuria, polydipsia. Musculoskeletal: Denies myalgias, back pain, joint swelling, arthralgias and gait problem.  Skin: Denies pallor, rash and wound.  Neurological: Denies dizziness, seizures, syncope, weakness, light-headedness, numbness and headaches.  Hematological: Denies adenopathy. Easy bruising, personal or family bleeding history  Psychiatric/Behavioral: Denies suicidal ideation, mood changes, confusion, nervousness, sleep disturbance and agitation   Physical Exam: Blood pressure 121/89, temperature 98 F (36.7 C), temperature source Oral, resp. rate 12, height 5' 8" (1.727 m), weight 78.7 kg (173 lb 8 oz), SpO2 98 %. General: Alert, awake, oriented 3, no distress, jovial, cooperative with exam. HEENT: Normocephalic, atraumatic, pupils equal round and reactive to light, sharp movements intact. Neck: Supple, no JVD, no lymphadenopathy, no bruits, no goiter. Excellent cardiovascular: Irregular, not tachycardic, no murmurs, rubs or gallops. Lungs: Clear to auscultation bilaterally. Abdomen: Soft, nontender, nondistended, positive bowel sounds, no masses or organomegaly noted. Extremities: No clubbing, cyanosis or edema, positive pedal pulses. Neurologic: Grossly intact and nonfocal.    Labs on Admission:  Results for  orders placed or performed during the hospital encounter of 07/30/14 (from the past 48 hour(s))  Magnesium     Status: None   Collection Time: 07/30/14  2:40 PM  Result Value Ref Range   Magnesium 1.9 1.5 - 2.5 mg/dL  CBC with Differential/Platelet     Status: Abnormal   Collection Time: 07/30/14  2:40 PM  Result Value Ref Range   WBC 9.3 4.0 - 10.5 K/uL   RBC 4.65 4.22 - 5.81 MIL/uL   Hemoglobin 14.8 13.0 - 17.0 g/dL   HCT 44.6 39.0 - 52.0 %   MCV 95.9 78.0 - 100.0 fL   MCH 31.8 26.0 - 34.0 pg   MCHC 33.2 30.0 - 36.0 g/dL   RDW 14.2 11.5 - 15.5 %   Platelets 232 150 - 400 K/uL   Neutrophils Relative % 58 43 - 77 %   Neutro Abs 5.5 1.7 - 7.7 K/uL   Lymphocytes Relative 24 12 - 46 %   Lymphs Abs 2.2 0.7 - 4.0 K/uL   Monocytes Relative 11 3 - 12 %   Monocytes Absolute 1.0 0.1 - 1.0 K/uL   Eosinophils Relative 6 (H) 0 - 5 %   Eosinophils Absolute 0.5 0.0 - 0.7 K/uL   Basophils Relative 1 0 - 1 %   Basophils Absolute 0.1 0.0 - 0.1 K/uL  Protime-INR     Status: None   Collection Time: 07/30/14  2:40 PM  Result Value Ref Range   Prothrombin Time 14.2 11.6 - 15.2 seconds   INR 1.09 0.00 - 1.49  Comprehensive metabolic panel     Status: Abnormal   Collection Time: 07/30/14  2:40 PM  Result Value Ref Range   Sodium 137 135 - 145 mmol/L   Potassium 4.2 3.5 - 5.1 mmol/L   Chloride 106 96 - 112 mmol/L   CO2 29 19 - 32 mmol/L   Glucose, Bld 125 (H) 70 - 99 mg/dL   BUN 22 6 - 23 mg/dL   Creatinine, Ser 1.23 0.50 - 1.35 mg/dL   Calcium 9.1 8.4 - 10.5 mg/dL   Total Protein 7.0 6.0 - 8.3 g/dL   Albumin 3.7 3.5 - 5.2 g/dL   AST 21 0 - 37 U/L   ALT 14 0 - 53 U/L   Alkaline Phosphatase 46 39 - 117 U/L   Total Bilirubin 1.1 0.3 - 1.2 mg/dL   GFR calc non Af Amer 54 (L) >90 mL/min   GFR calc Af Amer 63 (L) >90 mL/min    Comment: (NOTE) The eGFR has been calculated using the CKD EPI equation. This calculation has not been validated in all clinical situations. eGFR's persistently <90  mL/min signify possible Chronic Kidney Disease.    Anion gap 2 (L) 5 - 15    Radiological Exams on Admission: Nm Myocar Multi W/spect W/wall Motion / Ef  07/30/2014   CLINICAL DATA:  81-year-old male with a history of syncope and wall motion abnormalities on echocardiography referred for an ischemic evaluation.  EXAM: MYOCARDIAL IMAGING WITH SPECT (REST AND PHARMACOLOGIC-STRESS)  GATED LEFT VENTRICULAR WALL MOTION STUDY  LEFT VENTRICULAR EJECTION FRACTION  TECHNIQUE: Standard myocardial SPECT imaging was performed after resting intravenous injection of 10 mCi Tc-99m sestamibi. Subsequently, intravenous infusion of Lexiscan was performed under the supervision of the Cardiology staff. At peak effect of the drug, 30 mCi Tc-99m sestamibi was injected intravenously and standard myocardial SPECT imaging was performed. Quantitative gated imaging was also performed   to evaluate left ventricular wall motion, and estimate left ventricular ejection fraction.  COMPARISON:  None.  FINDINGS: Stress/ECG data: The patient was stressed according to the Lexiscan protocol. The heart rate ranged from 112 to 146 beats per min. The blood pressure averaged 118/91. No chest pain was reported. The patient was found to be in rapid atrial fibrillation with frequent PVCs. With stress, there were multiple episodes of non sustained ventricular tachycardia with occasional compensatory pauses lasting 2.5 seconds. The patient felt dizzy and lightheaded. He was subsequently admitted to the hospital for further observation.  Perfusion: There was a moderate sized area of mild to moderately decreased uptake in the inferolateral, mid inferoseptal, and inferoapical walls with no reversibility, suggestive of myocardial scar. No ischemic territories were seen.  Wall Motion: Inferior, inferoseptal, and inferoapical hypokinesis. No left ventricular dilation.  Left Ventricular Ejection Fraction: 27 %  End diastolic volume 053 ml  End systolic volume 83  ml  IMPRESSION: 1. Moderate degree of myocardial scar in the inferolateral, mid inferoseptal, and inferoapical walls. No ischemic zones seen.  2. Inferior, inferoseptal, and inferoapical hypokinesis.  3. Left ventricular ejection fraction 27%  4. High-risk stress test findings* based upon reduced LV systolic function. However, given arrhythmia, would recommend echocardiography for a more accurate assessment of LV systolic function and regional wall motion if indicated.  *2012 Appropriate Use Criteria for Coronary Revascularization Focused Update: J Am Coll Cardiol. 9767;34(1):937-902. http://content.airportbarriers.com.aspx?articleid=1201161   Electronically Signed   By: Kate Sable   On: 07/30/2014 15:18    Assessment/Plan Active Problems:   Atrial fibrillation with RVR   History of syncopal episode with atrial fibrillation, V. tach with compensatory pauses -Admitted to the ICU on an amiodarone drip with hopes of converting back to sinus rhythm. May require cardioversion and possibly cardiac catheterization pending stress test data. -Follow cardiology recommendations. -Start on heparin drip for anticoagulation.  DVT prophylaxis -Fully anticoagulated on heparin.  CODE STATUS -Full code as discussed with patient and wife at bedside.     Time Spent on Admission: 80 minutes  HERNANDEZ ACOSTA,ESTELA [facility] Pager: [phone] 07/30/2014, 4:23 PM

## 2014-07-30 NOTE — Progress Notes (Signed)
Stress Lab Nurses Notes - Forestine Na  GENE GLAZEBROOK 07/30/2014 Reason for doing test: Syncope and Cardiomyopathy Type of test: Wille Glaser Nurse performing test: Gerrit Halls, RN Nuclear Medicine Tech: Melburn Hake Echo Tech: Not Applicable MD performing test: Koneswaran/K.Purcell Nails NP Family MD: Woody Seller Test explained and consent signed: Yes.   IV started: Saline lock flushed, No redness or edema and Saline lock started in radiology Symptoms: dizziness, & Hypotensive Treatment/Intervention: None Reason test stopped: protocol completed After recovery IV was: No redness or edema and Saline Lock flushed Patient to return to [place]. Med at : Not at this time Patient discharged: Patient admitted to R[facility] ICU 10, transferred via stretcher. Patient's Condition upon discharge was: stable Comments: During test BP 98/64 & HR 144.  Patient started feeling dizziness, BP 75/62 & HR 60, and irregular with Atrial Fibrillation. Started having runs of V-tach. Patient stable and transferred to ICU, report given to [place]. Geanie Cooley T

## 2014-07-30 NOTE — Consult Note (Addendum)
CARDIOLOGY CONSULT NOTE   Patient ID: Alejandro [name] MRN: [id number] DOB/AGE: [birth year] 79 y.o.  Admit Date: 07/30/2014 Referring Physician: PTH Primary Physician: Glenda Chroman., MD Consulting Cardiologist: Kate Sable Primary Cardiologist Rozann Lesches MD Reason for Consultation: VT, Atrial fib, Arrhythmias with history of syncope  Clinical Summary Alejandro [name] is a 81 y.o.male with a prior history of syncopal episode, for which he was admitted to [facility] in February 2016.  The patient had said and onset of dizziness preceded by feeling of numbness in his right lower leg and blurriness in his right visual field.  The patient was seen by Dr. Domenic Polite in his office in [place], on 07/23/2014, he was seeing him for the first time on cardiac consultation.    At that office visit, Dr. Domenic Polite reviewed prior cardiac testing, which included an echocardiogram on 06/06/2014, which revealed an EF of 50-55% with hypokinesis of the basal and mid inferior lateral myocardium, grade 1 diastolic dysfunction.  Mild LVH.  In the setting of frequent ventricular ectopy.  Was also noted in Dr. Myles Gip note that he had a Cardiolite stress test, in December 2013 revealing no active ischemia with an EF of 30% and inferior scar versus attenuation.  Was also noted in Dr. Domenic Polite, that a  CT angiogram from 07/20/2014 reported dense atelectasis versus pneumonia in the posterior aspect of the left lower lobe, small, pleural effusion, no pulmonary emboli, with multiple calcified granuloma in both lung fields, no adenopathy, and incidentally noted.  Coronary atherosclerosis.  On that office visit, the patient was in normal sinus rhythm with frequent PVCs, he was scheduled for a nuclear medicine stress test.  Stress test was scheduled today.  He was found to be in atrial fibrillation at baseline, with frequent PVCs.  He was having some dizziness.  With his history of dizziness and near syncope, the patient  was changed to a LexiScan Cardiolite.  Within 1 minute after Cardiolite and LexiScan infusion.  The patient again had symptoms of feeling dizzy and lightheaded.  EKG demonstrated atrial tachycardia atrial fib with rate of 146 beats per minute.the patient had episodes of V. Tach, rates up to 240 beats per minute, compensatory pauses, up to a half seconds, with symptoms of diaphoresis, dizziness, blurred vision.  The patient was put in a supine position.  He continues to have episodes of ventricular tachycardia, pauses, and accelerated atrial tachycardia.  Dr. Bronson Ing was brought in to review the EKG, it was felt that the patient should be admitted to ICU and placed on an amiodarone drip.  It was felt that these ventricular ectopy, along with his pauses, although compensatory, may have been the etiology of his syncopal episode.  This has been discussed with the patient and his wife who verbalized understanding.  Hospitalist has been notified and he will be admitted to ICU, placed on amiodarone with appropriate labs completed.  We will await on the stress portion, of the nuclear medicine imaging until he becomes more stable.  He was brought to ICU on a stretcher from cardiac rehabilitation on a heart monitor.   No Known Allergies  Medications Scheduled Medications: . amiodarone  150 mg Intravenous Once    Infusions: . amiodarone     Followed by  . amiodarone      PRN Medications: technetium sestamibi generic   Past Medical History  Diagnosis Date  . Essential hypertension   . Secondary cardiomyopathy     LVEF 40-45% January 2014    Past  Surgical History  Procedure Laterality Date  . Laparoscopic appendectomy  November 2015    Dr. [name]    Family History  Problem Relation Age of Onset  . Heart disease Father     Diagnosed in his 23s    Social History Alejandro [name] reports that he has never smoked. He has never used smokeless tobacco. Alejandro [name] reports that he does not drink  alcohol.  Review of Systems Complete review of systems are found to be negative unless outlined in H&P above.  Physical Examination There were no vitals taken for this visit. No intake or output data in the 24 hours ending 07/30/14 1218  Telemetry:atrial fibrillation, with frequent ventricular ectopy.  LKJ:ZPHXT alert, oriented, feeling tired. HEENT: Conjunctiva and lids normal, oropharynx clear with moist mucosa. Neck: Supple, no elevated JVP or carotid bruits, no thyromegaly. Lungs: Clear to auscultation, nonlabored breathing at rest. Cardiac: Irregularegular rate and rhythm, no S3 or significant systolic murmur, no pericardial rub. Abdomen: Soft, nontender, no hepatomegaly, bowel sounds present, no guarding or rebound. Extremities: No pitting edema, distal pulses 2+. Skin: Warm and dry. Musculoskeletal: No kyphosis. Neuropsychiatric: Alert and oriented x3, affect grossly appropriate.  Prior Cardiac Testing/Procedures As discussed above Lab Results  Labs pending  Radiology: Pending  AVW:PVXYIA fibrillation, frequent ventricular ectopy.  Per stress test recordings   Impression and Recommendations 1. Atrial fibrillation 2.  V. Tach with compensatory pauses 3. History of syncopal episode  As discussed above, the patient will be admitted to ICU, placed on an amiodarone drip and followed.  We will delay his stress images until he becomes more stable.  Consideration for cardiac catheterization will be discussed by Dr. Bronson Ing on followup.   Signed: Phill Myron. Lawrence NP [place]  07/30/2014, 12:18 PM Co-Sign MD  The patient was seen and examined, and I agree with the assessment and plan as documented above, with modifications as noted below. Pt who was recently evaluated by Dr. Domenic Polite with aforementioned history including syncope of unclear etiology and prior history of nonischemic cardiomyopathy with normalized LV systolic function in 1/65 and did demonstrate wall motion  abnormalities suggestive of coronary artery disease. I was asked by K. Lawrence NP to evaluate ECG during stress test as pt felt dizzy and lightheaded, with ECG demonstrating atrial fibrillation and nonsustained ventricular tachycardia with occasional 2.5 second pauses. He has not undergone coronary angiography in the past although CT demonstrated "atheromatous coronary artery calcifications" in 2/16, for which he was started on ASA 81 mg (also given h/o TIA). Will admit to ICU and initiate amiodarone bolus with infusion in the hopes of cardioverting back to normal sinus rhythm. I have spoken with nuclear cardiology staff and should his rhythm stabilize within the next 2-3 hours, would then be able to obtain subsequent nuclear images. Will also initiate heparin infusion and hold off on anticoagulation with warfarin or factor Xa inhibitors, should he require coronary angiography in the near future.

## 2014-07-30 NOTE — Progress Notes (Signed)
ANTICOAGULATION CONSULT NOTE - Initial Consult  Pharmacy Consult for Heparin Indication: atrial fibrillation  No Known Allergies  Patient Measurements:   Heparin Dosing Weight: 82.7kg (patient's actual weight recorded 07/23/14)  Vital Signs: Temp: 98 F (36.7 C) (03/01 1312) Temp Source: Oral (03/01 1312)  Labs: No results for input(s): HGB, HCT, PLT, APTT, LABPROT, INR, HEPARINUNFRC, CREATININE, CKTOTAL, CKMB, TROPONINI in the last 72 hours.  CrCl cannot be calculated (Patient has no serum creatinine result on file.).   Medical History: Past Medical History  Diagnosis Date  . Essential hypertension   . Secondary cardiomyopathy     LVEF 40-45% January 2014    Medications:  Scheduled:  . amiodarone  150 mg Intravenous Once    Assessment: 61 yoM who was admitted today with syncope/ Afib.   Possible need for cardiac cath so decided to initiate   IV heparin for now.  CBC reviewed.  No bleeding noted.   Goal of Therapy:  Heparin level 0.3-0.7 units/ml Monitor platelets by anticoagulation protocol: Yes   Plan:  Give 4000 units bolus x 1 Start heparin infusion at 1100 units/hr Check anti-Xa level in 8 hours and daily while on heparin Continue to monitor H&H and platelets  Caylon Saine, Lavonia Drafts 07/30/2014,1:21 PM

## 2014-07-31 ENCOUNTER — Encounter (HOSPITAL_COMMUNITY)
Admission: AD | Disposition: A | Discharge status: Home / Self Care | Payer: Self-pay | Source: Ambulatory Visit | Attending: Cardiovascular Disease

## 2014-07-31 DIAGNOSIS — I2510 Atherosclerotic heart disease of native coronary artery without angina pectoris: Secondary | ICD-10-CM

## 2014-07-31 DIAGNOSIS — I493 Ventricular premature depolarization: Secondary | ICD-10-CM

## 2014-07-31 HISTORY — PX: LEFT HEART CATHETERIZATION WITH CORONARY ANGIOGRAM: SHX5451

## 2014-07-31 LAB — BASIC METABOLIC PANEL
ANION GAP: 4 — AB (ref 5–15)
BUN: 22 mg/dL (ref 6–23)
CO2: 28 mmol/L (ref 19–32)
Calcium: 8.7 mg/dL (ref 8.4–10.5)
Chloride: 106 mmol/L (ref 96–112)
Creatinine, Ser: 1.23 mg/dL (ref 0.50–1.35)
GFR calc non Af Amer: 54 mL/min — ABNORMAL LOW (ref >90)
GFR, EST AFRICAN AMERICAN: 63 mL/min — AB (ref >90)
Glucose, Bld: 108 mg/dL — ABNORMAL HIGH (ref 70–99)
POTASSIUM: 3.9 mmol/L (ref 3.5–5.1)
SODIUM: 138 mmol/L (ref 135–145)

## 2014-07-31 LAB — CBC
HCT: 40 % (ref 39.0–52.0)
HEMOGLOBIN: 13.3 g/dL (ref 13.0–17.0)
MCH: 31.7 pg (ref 26.0–34.0)
MCHC: 33.3 g/dL (ref 30.0–36.0)
MCV: 95.5 fL (ref 78.0–100.0)
Platelets: 206 10*3/uL (ref 150–400)
RBC: 4.19 MIL/uL — AB (ref 4.22–5.81)
RDW: 14.2 % (ref 11.5–15.5)
WBC: 7.6 10*3/uL (ref 4.0–10.5)

## 2014-07-31 LAB — HEPARIN LEVEL (UNFRACTIONATED): HEPARIN UNFRACTIONATED: 0.44 [IU]/mL (ref 0.30–0.70)

## 2014-07-31 SURGERY — LEFT HEART CATHETERIZATION WITH CORONARY ANGIOGRAM

## 2014-07-31 MED ORDER — HEPARIN SODIUM (PORCINE) 1000 UNIT/ML IJ SOLN
INTRAMUSCULAR | Status: AC
  Filled 2014-07-31: qty 1

## 2014-07-31 MED ORDER — SODIUM CHLORIDE 0.9 % IJ SOLN: 3.0000 mL | Freq: Two times a day (BID) | INTRAMUSCULAR | Status: DC

## 2014-07-31 MED ORDER — MIDAZOLAM HCL 2 MG/2ML IJ SOLN
INTRAMUSCULAR | Status: AC
  Filled 2014-07-31: qty 2

## 2014-07-31 MED ORDER — HEPARIN (PORCINE) IN NACL 2-0.9 UNIT/ML-% IJ SOLN
INTRAMUSCULAR | Status: AC
  Filled 2014-07-31: qty 1000

## 2014-07-31 MED ORDER — FENTANYL CITRATE 0.05 MG/ML IJ SOLN
INTRAMUSCULAR | Status: AC
  Filled 2014-07-31: qty 2

## 2014-07-31 MED ORDER — SODIUM CHLORIDE 0.9 % IJ SOLN: 3.0000 mL | INTRAMUSCULAR | Status: DC | PRN

## 2014-07-31 MED ORDER — NITROGLYCERIN 1 MG/10 ML FOR IR/CATH LAB
INTRA_ARTERIAL | Status: AC
  Filled 2014-07-31: qty 10

## 2014-07-31 MED ORDER — ASPIRIN 81 MG PO CHEW
CHEWABLE_TABLET | ORAL | Status: AC
  Filled 2014-07-31: qty 1

## 2014-07-31 MED ORDER — SODIUM CHLORIDE 0.9 % IV SOLN
250.0000 mL | INTRAVENOUS | Status: DC | PRN
  Administered 2014-07-31: 250 mL via INTRAVENOUS

## 2014-07-31 MED ORDER — LIDOCAINE HCL (PF) 1 % IJ SOLN
INTRAMUSCULAR | Status: AC
  Filled 2014-07-31: qty 30

## 2014-07-31 MED ORDER — VERAPAMIL HCL 2.5 MG/ML IV SOLN
INTRAVENOUS | Status: AC
  Filled 2014-07-31: qty 2

## 2014-07-31 MED ORDER — SODIUM CHLORIDE 0.9 % IV SOLN
INTRAVENOUS | Status: DC
  Administered 2014-07-31: 75 mL/h via INTRAVENOUS

## 2014-07-31 MED ORDER — SODIUM CHLORIDE 0.9 % IV SOLN: INTRAVENOUS | Status: DC

## 2014-07-31 MED ORDER — ASPIRIN 81 MG PO CHEW
81.0000 mg | CHEWABLE_TABLET | ORAL | Status: AC
Start: 2014-08-01 — End: 2014-07-31
  Administered 2014-07-31: 81 mg via ORAL

## 2014-07-31 NOTE — CV Procedure (Signed)
   Cardiac Catheterization Procedure Note  Name: Alejandro [name] MRN: [id number] DOB: 1932-06-14  Procedure: Left Heart Cath, Selective Coronary Angiography, LV angiography  Indication: Ventricular tachycardia, cardiomyopathy and abnormal nuclear stress test which showed fixed defects with no reversibility but low ejection fraction.  Medications:  Sedation:  1 mg IV Versed, 25 mcg IV Fentanyl  Contrast:  80 mL Omnipaque   Procedural Details: The right wrist was prepped, draped, and anesthetized with 1% lidocaine. Using the modified Seldinger technique, a 5 French sheath was introduced into the right radial artery. The artery was overall small and thus ultrasound guidance was used. 3 mg of verapamil was administered through the sheath, weight-based unfractionated heparin was administered intravenously. A Jackie catheter was used for selective coronary angiography. A JL 3.5 catheter was used to engage the left main coronary artery. A pigtail catheter was used for left ventriculography. Catheter exchanges were performed over an exchange length guidewire. There were no immediate procedural complications. A TR band was used for radial hemostasis at the completion of the procedure.  The patient was transferred to the post catheterization recovery area for further monitoring.  Procedural Findings:  Hemodynamics: AO:  110/59   mmHg LV:  105 over 1    mmHg LVEDP: 11  mmHg  Coronary angiography: Coronary dominance: Right   Left Main:  Normal in size with minor irregularities. The vessel is mildly calcified.  Left Anterior Descending (LAD):  Heavily calcified especially proximally. The vessel has mild diffuse atherosclerosis with no obstructive disease.  1st diagonal (D1):  Medium in size with 50% ostial stenosis.  2nd diagonal (D2):  Large in size with 50% proximal stenosis. This gives to normal size branches.  3rd diagonal (D3):  Very small in size.  Circumflex (LCx):  Normal in size with  60-70% ostial stenosis. There is 20% diffuse disease in the midsegment.  1st obtuse marginal:  Small in size with minor irregularities.  2nd obtuse marginal:  Medium in size with minor irregularities.  3rd obtuse marginal:  Normal in size with minor irregularities.   Ramus Intermedius:  Medium in size with 60% ostial stenosis.  Right Coronary Artery:  Large in size and aneurysmal throughout its segment. Its tortuous proximally. There is sluggish flow throughout the vessel with possible layered thrombus distally.  Posterior descending artery: Aneurysmal proximally with no significant stenosis.  Posterior AV segment: Normal in size with mild diffuse disease.  Left ventriculography: Left ventricular systolic function is moderately to severely reduced , LVEF is estimated at 30-35 %, there is no significant mitral regurgitation   Final Conclusions:   1. 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 Borderline significant ostial left circumflex stenosis. Otherwise no evidence of obstructive coronary artery disease. 2. Moderately to severely reduced LV systolic function with an ejection fraction of 30-35%. Normal left ventricular end-diastolic pressure.  Recommendations:  Recommend medical therapy for coronary artery disease. The stenosis in the ostial left circumflex is not optimal for PCI due to heavy calcifications at the ostium of the LAD and having to jail a ramus branch. Anticoagulation for atrial fibrillation with a NOAC can be initiated tomorrow. Recommend medical therapy for cardiomyopathy.  Kathlyn Sacramento MD, [facility] 07/31/2014, 5:52 PM

## 2014-07-31 NOTE — Interval H&P Note (Signed)
History and Physical Interval Note:  07/31/2014 5:07 PM  Alejandro [name]  has presented today for surgery, with the diagnosis of cp  The various methods of treatment have been discussed with the patient and family. After consideration of risks, benefits and other options for treatment, the patient has consented to  Procedure(s): LEFT HEART CATHETERIZATION WITH CORONARY ANGIOGRAM (N/A) as a surgical intervention .  The patient's history has been reviewed, patient examined, no change in status, stable for surgery.  I have reviewed the patient's chart and labs.  Questions were answered to the patient's satisfaction.     Kathlyn Sacramento

## 2014-07-31 NOTE — Progress Notes (Signed)
Received pt from 2 H waiting for cath procedure.  Pt alert and oriented X4.  No complaint of pain.  Pt on monitor.  ASA 81 mg PO given per MD order.  Pt to cath lab via bed

## 2014-07-31 NOTE — H&P (View-Only) (Signed)
Subjective:  Feels fine today.  Objective:  Vital Signs in the last 24 hours: Temp:  [97.8 F (36.6 C)-98.8 F (37.1 C)] 97.8 F (36.6 C) (03/02 0400) Pulse Rate:  [40-125] 54 (03/02 0800) Resp:  [3-26] 9 (03/02 0800) BP: (85-124)/(56-90) 104/74 mmHg (03/02 0800) SpO2:  [92 %-99 %] 98 % (03/02 0800) Weight:  [173 lb 8 oz (78.7 kg)-176 lb 12.9 oz (80.2 kg)] 176 lb 12.9 oz (80.2 kg) (03/02 0500)  Intake/Output from previous day: 03/01 0701 - 03/02 0700 In: 587.4 [I.V.:587.4] Out: 150 [Urine:150] Intake/Output from this shift: Total I/O In: 122.3 [I.V.:122.3] Out: -   Physical Exam: NECK: Without JVD, HJR, or bruit LUNGS: Clear anterior, posterior, lateral HEART: Regular rate and rhythm, no murmur, gallop, rub, bruit, thrill, or heave EXTREMITIES: Without cyanosis, clubbing, or edema   Lab Results:  Recent Labs  07/30/14 1440 07/31/14 0117  WBC 9.3 7.6  HGB 14.8 13.3  PLT 232 206    Recent Labs  07/30/14 1440 07/31/14 0117  NA 137 138  K 4.2 3.9  CL 106 106  CO2 29 28  GLUCOSE 125* 108*  BUN 22 22  CREATININE 1.23 1.23   No results for input(s): TROPONINI in the last 72 hours.  Invalid input(s): CK, MB Hepatic Function Panel  Recent Labs  07/30/14 1440  PROT 7.0  ALBUMIN 3.7  AST 21  ALT 14  ALKPHOS 46  BILITOT 1.1   No results for input(s): CHOL in the last 72 hours. No results for input(s): PROTIME in the last 72 hours.  Telemetry: SB-NSR with 2 beat runs NSVT  Cardiac Studies: 2Decho 05/2014: Impressions:  - Mild LVH with LVEF approximately 50-55% in the setting of   frequent ventricular ectopy. There is mid to basal inferolateral   hypokinesis and grade 1 diastolic dysfunction. Sclerotic aortic   valve with trivial aortic regurgitation. Mildly ectatic aortic   root. Trivial tricuspid regurgitation with PASP 27 mmHg.  Stress Myoview: IMPRESSION: 1. Moderate degree of myocardial scar in the inferolateral, mid inferoseptal, and  inferoapical walls. No ischemic zones seen.   2. Inferior, inferoseptal, and inferoapical hypokinesis.   3. Left ventricular ejection fraction 27%   4. High-risk stress test findings* based upon reduced LV systolic function. However, given arrhythmia, would recommend echocardiography for a more accurate assessment of LV systolic function and regional wall motion if indicated.   Chest CT angiogram from 07/20/2014 reported dense atelectasis versus pneumonia in the posterior aspect of the left lower lobe, small left pleural effusion, no pulmonary emboli, multiple calcified granulomata in both lung fields, no adenopathy, incidentally noted coronary atherosclerosis.  Cardiolite in December 2013 showed no active ischemia with LVEF 30%, and inferior scar versus attenuation   Assessment/Plan:  Impression and Recommendations 1. Atrial fibrillation during stress test now in NSR on IV Amiodarone. 2.  V. Tach with 2.5 compensatory pauses 3. History of syncopal episode 4. EF 27% on myoview with Moderate scar as described above. EF on 2Decho 05/2014 50-55%. Will discuss possible transfer for cardiac catherization. No evidence of CHF.    LOS: 1 day    Alejandro [name] 07/31/2014, 9:09 AM   The patient was seen and examined, and I agree with the assessment and plan as documented above, with modifications as noted below. Pt now in sinus rhythm but is still having frequent PVC's and runs of nonsustained ventricular tachycardia. Nuclear MPI study results noted above. The calculated LVEF is likely low due to gating abnormalities from atrial fibrillation. While  Alejandro [name] VT is likely due to myocardial scar (as seen on stress testing), I cannot be certain he does not have balanced ischemia. Additionally, he had a syncopal or near syncopal episode which led to Alejandro [name] evaluation at [facility]. I had a long discussion with the patient and Alejandro [name] wife, and I feel the best course of action is to pursue coronary  angiography to delineate Alejandro [name] coronary anatomy and definitively exclude ischemia as an arrhythmic etiology. Will continue IV amiodarone and IV heparin for now (rapid atrial fibrillation yesterday), and plan to transfer to [facility] today.

## 2014-07-31 NOTE — Progress Notes (Addendum)
Subjective:  Feels fine today.  Objective:  Vital Signs in the last 24 hours: Temp:  [97.8 F (36.6 C)-98.8 F (37.1 C)] 97.8 F (36.6 C) (03/02 0400) Pulse Rate:  [40-125] 54 (03/02 0800) Resp:  [3-26] 9 (03/02 0800) BP: (85-124)/(56-90) 104/74 mmHg (03/02 0800) SpO2:  [92 %-99 %] 98 % (03/02 0800) Weight:  [173 lb 8 oz (78.7 kg)-176 lb 12.9 oz (80.2 kg)] 176 lb 12.9 oz (80.2 kg) (03/02 0500)  Intake/Output from previous day: 03/01 0701 - 03/02 0700 In: 587.4 [I.V.:587.4] Out: 150 [Urine:150] Intake/Output from this shift: Total I/O In: 122.3 [I.V.:122.3] Out: -   Physical Exam: NECK: Without JVD, HJR, or bruit LUNGS: Clear anterior, posterior, lateral HEART: Regular rate and rhythm, no murmur, gallop, rub, bruit, thrill, or heave EXTREMITIES: Without cyanosis, clubbing, or edema   Lab Results:  Recent Labs  07/30/14 1440 07/31/14 0117  WBC 9.3 7.6  HGB 14.8 13.3  PLT 232 206    Recent Labs  07/30/14 1440 07/31/14 0117  NA 137 138  K 4.2 3.9  CL 106 106  CO2 29 28  GLUCOSE 125* 108*  BUN 22 22  CREATININE 1.23 1.23   No results for input(s): TROPONINI in the last 72 hours.  Invalid input(s): CK, MB Hepatic Function Panel  Recent Labs  07/30/14 1440  PROT 7.0  ALBUMIN 3.7  AST 21  ALT 14  ALKPHOS 46  BILITOT 1.1   No results for input(s): CHOL in the last 72 hours. No results for input(s): PROTIME in the last 72 hours.  Telemetry: SB-NSR with 2 beat runs NSVT  Cardiac Studies: 2Decho 05/2014: Impressions:  - Mild LVH with LVEF approximately 50-55% in the setting of   frequent ventricular ectopy. There is mid to basal inferolateral   hypokinesis and grade 1 diastolic dysfunction. Sclerotic aortic   valve with trivial aortic regurgitation. Mildly ectatic aortic   root. Trivial tricuspid regurgitation with PASP 27 mmHg.  Stress Myoview: IMPRESSION: 1. Moderate degree of myocardial scar in the inferolateral, mid inferoseptal, and  inferoapical walls. No ischemic zones seen.   2. Inferior, inferoseptal, and inferoapical hypokinesis.   3. Left ventricular ejection fraction 27%   4. High-risk stress test findings* based upon reduced LV systolic function. However, given arrhythmia, would recommend echocardiography for a more accurate assessment of LV systolic function and regional wall motion if indicated.   Chest CT angiogram from 07/20/2014 reported dense atelectasis versus pneumonia in the posterior aspect of the left lower lobe, small left pleural effusion, no pulmonary emboli, multiple calcified granulomata in both lung fields, no adenopathy, incidentally noted coronary atherosclerosis.  Cardiolite in December 2013 showed no active ischemia with LVEF 30%, and inferior scar versus attenuation   Assessment/Plan:  Impression and Recommendations 1. Atrial fibrillation during stress test now in NSR on IV Amiodarone. 2.  V. Tach with 2.5 compensatory pauses 3. History of syncopal episode 4. EF 27% on myoview with Moderate scar as described above. EF on 2Decho 05/2014 50-55%. Will discuss possible transfer for cardiac catherization. No evidence of CHF.    LOS: 1 day    Ermalinda Barrios 07/31/2014, 9:09 AM   The patient was seen and examined, and I agree with the assessment and plan as documented above, with modifications as noted below. Pt now in sinus rhythm but is still having frequent PVC's and runs of nonsustained ventricular tachycardia. Nuclear MPI study results noted above. The calculated LVEF is likely low due to gating abnormalities from atrial fibrillation. While  his VT is likely due to myocardial scar (as seen on stress testing), I cannot be certain he does not have balanced ischemia. Additionally, he had a syncopal or near syncopal episode which led to his evaluation at [facility]. I had a long discussion with the patient and his wife, and I feel the best course of action is to pursue coronary  angiography to delineate his coronary anatomy and definitively exclude ischemia as an arrhythmic etiology. Will continue IV amiodarone and IV heparin for now (rapid atrial fibrillation yesterday), and plan to transfer to [facility] today.

## 2014-07-31 NOTE — Progress Notes (Signed)
ANTICOAGULATION CONSULT NOTE - follow up  Pharmacy Consult for Heparin Indication: atrial fibrillation  No Known Allergies  Patient Measurements: Height: 5\' 8"  (172.7 cm) Weight: 176 lb 12.9 oz (80.2 kg) IBW/kg (Calculated) : 68.4 Heparin Dosing Weight: 78kg  Vital Signs: Temp: 97.8 F (36.6 C) (03/02 0400) Temp Source: Oral (03/02 0400) BP: 116/81 mmHg (03/02 0500) Pulse Rate: 54 (03/02 0500)  Labs:  Recent Labs  07/30/14 1440 07/31/14 0117  HGB 14.8 13.3  HCT 44.6 40.0  PLT 232 206  LABPROT 14.2  --   INR 1.09  --   HEPARINUNFRC  --  0.44  CREATININE 1.23 1.23   Estimated Creatinine Clearance: 47.1 mL/min (by C-G formula based on Cr of 1.23).  Medical History: Past Medical History  Diagnosis Date  . Essential hypertension   . Secondary cardiomyopathy     LVEF 40-45% January 2014   Medications:  Scheduled:  . amiodarone  150 mg Intravenous Once  . sodium chloride  3 mL Intravenous Q12H   Assessment: 23 yoM who experienced syncope, found to be in Afib when presenting for stress test today.  Possible need for cardiac cath so plan to initiate heparin until need for intervention determined. CBC reviewed.  No bleeding noted. Heparin level therapeutic  Goal of Therapy:  Heparin level 0.3-0.7 units/ml Monitor platelets by anticoagulation protocol: Yes   Plan:  Continue Heparin at 1100 units/hr Heparin level and CBC daily F/U plan to transition to [facility]  [place] Crane, Jobie Popp A 07/31/2014,7:38 AM

## 2014-07-31 NOTE — Care Management Note (Addendum)
    Page 1 of 1   08/02/2014     2:57:50 PM CARE MANAGEMENT NOTE 08/02/2014  Patient:  LOC, FEINSTEIN   Account Number:  [id number]  Date Initiated:  07/31/2014  Documentation initiated by:  Elissa Hefty  Subjective/Objective Assessment:   adm w at fib w rvr     Action/Plan:   lives w wife, pcp dr d vyas   Anticipated DC Date:     Anticipated DC Plan:  HOME/SELF CARE         Choice offered to / List presented to:             Status of service:   Medicare Important Message given?  YES (If response is "NO", the following Medicare IM given date fields will be blank) Date Medicare IM given:  08/02/2014 Medicare IM given by:  Elissa Hefty Date Additional Medicare IM given:   Additional Medicare IM given by:    Discharge Disposition:    Per UR Regulation:  Reviewed for med. necessity/level of care/duration of stay  If discussed at Long Length of Stay Meetings, dates discussed:    Comments:  3/4 late entry 16 debbie Edwina Grossberg rn,bsn gave pt 30day free eliquis card. he does not have medicare d plan. he will use 30day card. aware that eliquis maybe around 400.00 per month per [facility] and [facility] in [place].he will also ck into medicare d plan before 30day meds free runs out.

## 2014-07-31 NOTE — Progress Notes (Signed)
Patient Demographics  Alejandro [name], is a 81 y.o. male, DOB - 1933/03/15, HQI:[id number]  Admit date - 07/30/2014   Admitting Physician Alejandro Hau, MD  Outpatient Primary MD for the patient is Alejandro B., MD  LOS - 1   No chief complaint on file.       Subjective:   [name] today has, No headache, No chest pain, No abdominal pain - No Nausea, No new weakness tingling or numbness, No Cough - SOB.    Assessment & Plan    1. A. fib RVR with episodes of V. tach and 2.5 second sinus pauses. Currently stable on amiodarone and heparin drip, discussed with cardiologist Dr Bronson Ing, per cardiologist patient will be transferred to [facility] under cardiology service. No medical issues.    Code Status: Full  Family Communication:   Disposition Plan: to [facility] under cards   Procedures     Consults  Cards   Medications  Scheduled Meds: . amiodarone  150 mg Intravenous Once  . sodium chloride  3 mL Intravenous Q12H   Continuous Infusions: . amiodarone 30 mg/hr (07/31/14 0800)  . heparin 1,100 Units/hr (07/31/14 0800)   PRN Meds:.sodium chloride, acetaminophen **OR** acetaminophen, ondansetron **OR** ondansetron (ZOFRAN) IV, oxyCODONE, senna-docusate, sodium chloride  DVT Prophylaxis    Heparin   Lab Results  Component Value Date   PLT 206 07/31/2014    Antibiotics     Anti-infectives    None          Objective:   Filed Vitals:   07/31/14 0500 07/31/14 0700 07/31/14 0730 07/31/14 0800  BP: 116/81 99/73 111/77 104/74  Pulse: 54 52 55 54  Temp:      TempSrc:      Resp: 8 16 8 9   Height:      Weight: 80.2 kg (176 lb 12.9 oz)     SpO2: 97% 96% 96% 98%    Wt Readings from Last 3 Encounters:  07/31/14 80.2 kg (176 lb 12.9 oz)  07/23/14  82.736 kg (182 lb 6.4 oz)  06/04/14 83.008 kg (183 lb)     Intake/Output Summary (Last 24 hours) at 07/31/14 1031 Last data filed at 07/31/14 0800  Gross per 24 hour  Intake 709.66 ml  Output    150 ml  Net 559.66 ml     Physical Exam  Awake Alert, Oriented X 3, No new F.N deficits, Normal affect [place].AT,PERRAL Supple Neck,No JVD, No cervical lymphadenopathy appriciated.  Symmetrical Chest wall movement, Good air movement bilaterally, CTAB RRR,No Gallops,Rubs or new Murmurs, No Parasternal Heave +ve BrooksSounds, Abd Soft, No tenderness, No organomegaly appriciated, No rebound - guarding or rigidity. No Cyanosis, Clubbing or edema, No new Rash or bruise    Data Review   Micro Results Recent Results (from the past 240 hour(s))  MRSA PCR Screening     Status: None   Collection Time: 07/30/14 12:45 PM  Result Value Ref Range Status   MRSA by PCR NEGATIVE NEGATIVE Final    Comment:        The GeneXpert MRSA Assay (FDA approved for NASAL specimens only), is one component of a comprehensive MRSA colonization surveillance program. It is not intended to diagnose MRSA infection nor to guide  or monitor treatment for MRSA infections.     Radiology Reports Nm Myocar Multi W/spect W/wall Motion / Ef  07/30/2014   CLINICAL DATA:  81 year old male with a history of syncope and wall motion abnormalities on echocardiography referred for an ischemic evaluation.  EXAM: MYOCARDIAL IMAGING WITH SPECT (REST AND PHARMACOLOGIC-STRESS)  GATED LEFT VENTRICULAR WALL MOTION STUDY  LEFT VENTRICULAR EJECTION FRACTION  TECHNIQUE: Standard myocardial SPECT imaging was performed after resting intravenous injection of 10 mCi Tc-60m sestamibi. Subsequently, intravenous infusion of Lexiscan was performed under the supervision of the Cardiology staff. At peak effect of the drug, 30 mCi Tc-11m sestamibi was injected intravenously and standard myocardial SPECT imaging was performed. Quantitative gated imaging was  also performed to evaluate left ventricular wall motion, and estimate left ventricular ejection fraction.  COMPARISON:  None.  FINDINGS: Stress/ECG data: The patient was stressed according to the Lexiscan protocol. The heart rate ranged from 112 to 146 beats per min. The blood pressure averaged 118/91. No chest pain was reported. The patient was found to be in rapid atrial fibrillation with frequent PVCs. With stress, there were multiple episodes of non sustained ventricular tachycardia with occasional compensatory pauses lasting 2.5 seconds. The patient felt dizzy and lightheaded. He was subsequently admitted to the hospital for further observation.  Perfusion: There was a moderate sized area of mild to moderately decreased uptake in the inferolateral, mid inferoseptal, and inferoapical walls with no reversibility, suggestive of myocardial scar. No ischemic territories were seen.  Wall Motion: Inferior, inferoseptal, and inferoapical hypokinesis. No left ventricular dilation.  Left Ventricular Ejection Fraction: 27 %  End diastolic volume 381 ml  End systolic volume 83 ml  IMPRESSION: 1. Moderate degree of myocardial scar in the inferolateral, mid inferoseptal, and inferoapical walls. No ischemic zones seen.  2. Inferior, inferoseptal, and inferoapical hypokinesis.  3. Left ventricular ejection fraction 27%  4. High-risk stress test findings* based upon reduced LV systolic function. However, given arrhythmia, would recommend echocardiography for a more accurate assessment of LV systolic function and regional wall motion if indicated.  *2012 Appropriate Use Criteria for Coronary Revascularization Focused Update: J Am Coll Cardiol. 8299;37(1):696-789. http://content.airportbarriers.com.aspx?articleid=1201161   Electronically Signed   By: Alejandro [name]   On: 07/30/2014 15:18     CBC  Recent Labs Lab 07/30/14 1440 07/31/14 0117  WBC 9.3 7.6  HGB 14.8 13.3  HCT 44.6 40.0  PLT 232 206  MCV 95.9 95.5    MCH 31.8 31.7  MCHC 33.2 33.3  RDW 14.2 14.2  LYMPHSABS 2.2  --   MONOABS 1.0  --   EOSABS 0.5  --   BASOSABS 0.1  --     Chemistries   Recent Labs Lab 07/30/14 1440 07/31/14 0117  NA 137 138  K 4.2 3.9  CL 106 106  CO2 29 28  GLUCOSE 125* 108*  BUN 22 22  CREATININE 1.23 1.23  CALCIUM 9.1 8.7  MG 1.9  --   AST 21  --   ALT 14  --   ALKPHOS 46  --   BILITOT 1.1  --    ------------------------------------------------------------------------------------------------------------------ estimated creatinine clearance is 47.1 mL/min (by C-G formula based on Cr of 1.23). ------------------------------------------------------------------------------------------------------------------ No results for input(s): HGBA1C in the last 72 hours. ------------------------------------------------------------------------------------------------------------------ No results for input(s): CHOL, HDL, LDLCALC, TRIG, CHOLHDL, LDLDIRECT in the last 72 hours. ------------------------------------------------------------------------------------------------------------------  Recent Labs  07/30/14 1725  TSH 1.989   ------------------------------------------------------------------------------------------------------------------ No results for input(s): VITAMINB12, FOLATE, FERRITIN, TIBC, IRON, RETICCTPCT in the last  72 hours.  Coagulation profile  Recent Labs Lab 07/30/14 1440  INR 1.09    No results for input(s): DDIMER in the last 72 hours.  Cardiac Enzymes No results for input(s): CKMB, TROPONINI, MYOGLOBIN in the last 168 hours.  Invalid input(s): CK ------------------------------------------------------------------------------------------------------------------ Invalid input(s): POCBNP     Time Spent in minutes   35   SINGH,PRASHANT K M.D on 07/31/2014 at 10:31 AM  Between 7am to 7pm - Pager - [phone]  After 7pm go to [email or web address] - password [facility]  [facility]   Office  [phone]

## 2014-08-01 ENCOUNTER — Encounter (HOSPITAL_COMMUNITY): Payer: Self-pay | Admitting: Cardiovascular Disease

## 2014-08-01 DIAGNOSIS — I472 Ventricular tachycardia: Secondary | ICD-10-CM

## 2014-08-01 DIAGNOSIS — I255 Ischemic cardiomyopathy: Secondary | ICD-10-CM

## 2014-08-01 LAB — CBC
HCT: 39.6 % (ref 39.0–52.0)
Hemoglobin: 13.2 g/dL (ref 13.0–17.0)
MCH: 31 pg (ref 26.0–34.0)
MCHC: 33.3 g/dL (ref 30.0–36.0)
MCV: 93 fL (ref 78.0–100.0)
PLATELETS: 195 10*3/uL (ref 150–400)
RBC: 4.26 MIL/uL (ref 4.22–5.81)
RDW: 14.2 % (ref 11.5–15.5)
WBC: 6.2 10*3/uL (ref 4.0–10.5)

## 2014-08-01 MED ORDER — CARVEDILOL 3.125 MG PO TABS
3.1250 mg | ORAL_TABLET | Freq: Two times a day (BID) | ORAL | Status: DC
  Administered 2014-08-01: 3.125 mg via ORAL
  Filled 2014-08-01 (×2): qty 1

## 2014-08-01 MED ORDER — APIXABAN 5 MG PO TABS
5.0000 mg | ORAL_TABLET | Freq: Two times a day (BID) | ORAL | Status: DC
  Administered 2014-08-01 – 2014-08-04 (×6): 5 mg via ORAL
  Filled 2014-08-01 (×7): qty 1

## 2014-08-01 MED ORDER — AMIODARONE HCL 200 MG PO TABS
400.0000 mg | ORAL_TABLET | Freq: Every day | ORAL | Status: DC
  Administered 2014-08-01 – 2014-08-04 (×4): 400 mg via ORAL
  Filled 2014-08-01 (×5): qty 2

## 2014-08-01 MED ORDER — CARVEDILOL 3.125 MG PO TABS
3.1250 mg | ORAL_TABLET | Freq: Two times a day (BID) | ORAL | Status: DC
Start: 2014-08-02 — End: 2014-08-03
  Administered 2014-08-02 – 2014-08-03 (×4): 3.125 mg via ORAL
  Filled 2014-08-01 (×5): qty 1

## 2014-08-01 NOTE — Progress Notes (Signed)
Subjective:  No chest pain  Objective:   Vital Signs : Filed Vitals:   08/01/14 0600 08/01/14 0700 08/01/14 0808 08/01/14 1202  BP: 98/78 113/80 102/67 112/73  Pulse: 50 57 55 61  Temp:   98.1 F (36.7 C) 98.1 F (36.7 C)  TempSrc:   Oral Oral  Resp: 18 11 15 17   Height:      Weight:      SpO2: 93% 96% 94% 95%    Intake/Output from previous day:  Intake/Output Summary (Last 24 hours) at 08/01/14 1420 Last data filed at 08/01/14 1300  Gross per 24 hour  Intake 1249.47 ml  Output   1375 ml  Net -125.53 ml     I/O since admission: +34  Wt Readings from Last 3 Encounters:  07/31/14 176 lb 2.4 oz (79.9 kg)  07/23/14 182 lb 6.4 oz (82.736 kg)  06/04/14 183 lb (83.008 kg)    Medications: . amiodarone  150 mg Intravenous Once  . sodium chloride  3 mL Intravenous Q12H    . amiodarone 30 mg/hr (08/01/14 1326)    Physical Exam:   General appearance: alert, cooperative and no distress Neck: no adenopathy, no JVD, supple, symmetrical, trachea midline and thyroid not enlarged, symmetric, no tenderness/mass/nodules Lungs: clear to auscultation bilaterally Heart: regular rate and rhythm Abdomen: soft, non-tender; bowel sounds normal; no masses,  no organomegaly Extremities: no edema, redness or tenderness in the calves or thighs Pulses: 2+ and symmetric Neurologic: Grossly normal   Rate: 62  Rhythm: normal sinus rhythm  ECG (independently read by me): not yet done today; will schedule now that he has converted to NSR  Lab Results:  BMP Latest Ref Rng 07/31/2014 07/30/2014 05/21/2013  Glucose 70 - 99 mg/dL 108(H) 125(H) 91  BUN 6 - 23 mg/dL 22 22 15   Creatinine 0.50 - 1.35 mg/dL 1.23 1.23 0.96  Sodium 135 - 145 mmol/L 138 137 140  Potassium 3.5 - 5.1 mmol/L 3.9 4.2 4.3  Chloride 96 - 112 mmol/L 106 106 105  CO2 19 - 32 mmol/L 28 29 27   Calcium 8.4 - 10.5 mg/dL 8.7 9.1 9.3     CBC Latest Ref Rng 08/01/2014 07/31/2014 07/30/2014  WBC 4.0 - 10.5 K/uL 6.2 7.6 9.3    Hemoglobin 13.0 - 17.0 g/dL 13.2 13.3 14.8  Hematocrit 39.0 - 52.0 % 39.6 40.0 44.6  Platelets 150 - 400 K/uL 195 206 232     No results for input(s): TROPONINI in the last 72 hours.  Invalid input(s): CK, MB  Hepatic Function Panel  Recent Labs  07/30/14 1440  PROT 7.0  ALBUMIN 3.7  AST 21  ALT 14  ALKPHOS 46  BILITOT 1.1    Recent Labs  07/30/14 1440  INR 1.09   BNP (last 3 results) No results for input(s): BNP in the last 8760 hours.  ProBNP (last 3 results) No results for input(s): PROBNP in the last 8760 hours.   Lipid Panel     Component Value Date/Time   CHOL 152 05/21/2013 0951   TRIG 83 05/21/2013 0951   HDL 42 05/21/2013 0951   CHOLHDL 3.6 05/21/2013 0951   VLDL 17 05/21/2013 0951   LDLCALC 93 05/21/2013 0951      Imaging:  Cardiac Studies: 2Decho 05/2014: Impressions:  - Mild LVH with LVEF approximately 50-55% in the setting of frequent ventricular ectopy. There is mid to basal inferolateral hypokinesis and grade 1 diastolic dysfunction. Sclerotic aortic valve with trivial aortic regurgitation. Mildly ectatic aortic  root. Trivial tricuspid regurgitation with PASP 27 mmHg.  Nm Myocar Multi W/spect W/wall Motion / Ef  07/30/2014   CLINICAL DATA:  81 year old male with a history of syncope and wall motion abnormalities on echocardiography referred for an ischemic evaluation.  EXAM: MYOCARDIAL IMAGING WITH SPECT (REST AND PHARMACOLOGIC-STRESS)  GATED LEFT VENTRICULAR WALL MOTION STUDY  LEFT VENTRICULAR EJECTION FRACTION  TECHNIQUE: Standard myocardial SPECT imaging was performed after resting intravenous injection of 10 mCi Tc-19m sestamibi. Subsequently, intravenous infusion of Lexiscan was performed under the supervision of the Cardiology staff. At peak effect of the drug, 30 mCi Tc-26m sestamibi was injected intravenously and standard myocardial SPECT imaging was performed. Quantitative gated imaging was also performed to evaluate left  ventricular wall motion, and estimate left ventricular ejection fraction.  COMPARISON:  None.  FINDINGS: Stress/ECG data: The patient was stressed according to the Lexiscan protocol. The heart rate ranged from 112 to 146 beats per min. The blood pressure averaged 118/91. No chest pain was reported. The patient was found to be in rapid atrial fibrillation with frequent PVCs. With stress, there were multiple episodes of non sustained ventricular tachycardia with occasional compensatory pauses lasting 2.5 seconds. The patient felt dizzy and lightheaded. He was subsequently admitted to the hospital for further observation.  Perfusion: There was a moderate sized area of mild to moderately decreased uptake in the inferolateral, mid inferoseptal, and inferoapical walls with no reversibility, suggestive of myocardial scar. No ischemic territories were seen.  Wall Motion: Inferior, inferoseptal, and inferoapical hypokinesis. No left ventricular dilation.  Left Ventricular Ejection Fraction: 27 %  End diastolic volume 505 ml  End systolic volume 83 ml  IMPRESSION: 1. Moderate degree of myocardial scar in the inferolateral, mid inferoseptal, and inferoapical walls. No ischemic zones seen.  2. Inferior, inferoseptal, and inferoapical hypokinesis.  3. Left ventricular ejection fraction 27%  4. High-risk stress test findings* based upon reduced LV systolic function. However, given arrhythmia, would recommend echocardiography for a more accurate assessment of LV systolic function and regional wall motion if indicated.  *2012 Appropriate Use Criteria for Coronary Revascularization Focused Update: J Am Coll Cardiol. 3976;73(4):193-790. http://[email or web address]?[email or web address]   Electronically Signed   By: Kate Sable   On: 07/30/2014 15:18   Cardiac Cath 07/31/2014 Procedural Findings:  Hemodynamics: AO: 110/59 mmHg LV: 105 over 1 mmHg LVEDP: 11 mmHg  Coronary angiography: Coronary  dominance: Right   Left Main: Normal in size with minor irregularities. The vessel is mildly calcified.  Left Anterior Descending (LAD): Heavily calcified especially proximally. The vessel has mild diffuse atherosclerosis with no obstructive disease.  1st diagonal (D1): Medium in size with 50% ostial stenosis.  2nd diagonal (D2): Large in size with 50% proximal stenosis. This gives to normal size branches.  3rd diagonal (D3): Very small in size.  Circumflex (LCx): Normal in size with 60-70% ostial stenosis. There is 20% diffuse disease in the midsegment.  1st obtuse marginal: Small in size with minor irregularities.  2nd obtuse marginal: Medium in size with minor irregularities.  3rd obtuse marginal: Normal in size with minor irregularities.  Ramus Intermedius: Medium in size with 60% ostial stenosis.  Right Coronary Artery: Large in size and aneurysmal throughout its segment. Its tortuous proximally. There is sluggish flow throughout the vessel with possible layered thrombus distally.  Posterior descending artery: Aneurysmal proximally with no significant stenosis.  Posterior AV segment: Normal in size with mild diffuse disease.  Left ventriculography: Left ventricular systolic function is moderately to severely reduced , LVEF is estimated  at 30-35 %, there is no significant mitral regurgitation   Final Conclusions:  1. 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 Borderline significant ostial left circumflex stenosis. Otherwise no evidence of obstructive coronary artery disease. 2. Moderately to severely reduced LV systolic function with an ejection fraction of 30-35%. Normal left ventricular end-diastolic pressure.  Recommendations:  Recommend medical therapy for coronary artery disease. The stenosis in the ostial left circumflex is not optimal for PCI due to heavy calcifications at the ostium of  the LAD and having to jail a ramus branch. Anticoagulation for atrial fibrillation with a NOAC can be initiated tomorrow. Recommend medical therapy for cardiomyopathy.   Assessment/Plan:   Active Problems:   Atrial fibrillation with RVR  1. CAD cath findings as noted; medical therapy. 2. Cardiomyopathy with cath EF 30 - 35% decreased from Jan 2016 50 - 55% on echo. Will add lisinopril 2.5 mg today; consider low dose coreg. 3. AF with RVR converted to Sinus rhythm on iv amiodarone;  Will transition to 400 mg daily and dc iv amio. No further NSVT 4. Anticoagulation:   Chadsvasc score is 3-4. This patients CHA2DS2-VASc Score and unadjusted Ischemic Stroke Rate (% per year) is equal to 3.2 % stroke rate/year from a score of 3 or if 4 then 4.8%.  Above score calculated as 1 point each if present [CHF, HTN, DM, Vascular=MI/PAD/Aortic Plaque, Age if 65-74, or Male] Above score calculated as 2 points each if present [Age > 75, or Stroke/TIA/TE]  Will start eliquis at 5 mg bid.  Above score calculated as 1 point each if present [CHF, HTN, DM, Vascular=MI/PAD/Aortic Plaque, Age if 65-74, or Male] Above score calculated as 2 points each if present [Age > 75, or Stroke/TIA/TE]   Troy Sine, MD, [facility] 08/01/2014, 2:20 PM

## 2014-08-02 DIAGNOSIS — Z7901 Long term (current) use of anticoagulants: Secondary | ICD-10-CM

## 2014-08-02 DIAGNOSIS — E785 Hyperlipidemia, unspecified: Secondary | ICD-10-CM

## 2014-08-02 DIAGNOSIS — I2510 Atherosclerotic heart disease of native coronary artery without angina pectoris: Secondary | ICD-10-CM

## 2014-08-02 LAB — COMPREHENSIVE METABOLIC PANEL
ALK PHOS: 37 U/L — AB (ref 39–117)
ALT: 11 U/L (ref 0–53)
ANION GAP: 8 (ref 5–15)
AST: 17 U/L (ref 0–37)
Albumin: 3.2 g/dL — ABNORMAL LOW (ref 3.5–5.2)
BILIRUBIN TOTAL: 1.1 mg/dL (ref 0.3–1.2)
BUN: 15 mg/dL (ref 6–23)
CO2: 25 mmol/L (ref 19–32)
Calcium: 8.8 mg/dL (ref 8.4–10.5)
Chloride: 105 mmol/L (ref 96–112)
Creatinine, Ser: 1.29 mg/dL (ref 0.50–1.35)
GFR calc Af Amer: 59 mL/min — ABNORMAL LOW (ref >90)
GFR calc non Af Amer: 51 mL/min — ABNORMAL LOW (ref >90)
Glucose, Bld: 95 mg/dL (ref 70–99)
POTASSIUM: 3.7 mmol/L (ref 3.5–5.1)
SODIUM: 138 mmol/L (ref 135–145)
Total Protein: 5.9 g/dL — ABNORMAL LOW (ref 6.0–8.3)

## 2014-08-02 LAB — CBC
HCT: 38.6 % — ABNORMAL LOW (ref 39.0–52.0)
Hemoglobin: 12.9 g/dL — ABNORMAL LOW (ref 13.0–17.0)
MCH: 31.2 pg (ref 26.0–34.0)
MCHC: 33.4 g/dL (ref 30.0–36.0)
MCV: 93.2 fL (ref 78.0–100.0)
Platelets: 169 10*3/uL (ref 150–400)
RBC: 4.14 MIL/uL — ABNORMAL LOW (ref 4.22–5.81)
RDW: 14 % (ref 11.5–15.5)
WBC: 5.9 10*3/uL (ref 4.0–10.5)

## 2014-08-02 LAB — BRAIN NATRIURETIC PEPTIDE: B Natriuretic Peptide: 285.1 pg/mL — ABNORMAL HIGH (ref 0.0–100.0)

## 2014-08-02 MED ORDER — LISINOPRIL 2.5 MG PO TABS
2.5000 mg | ORAL_TABLET | Freq: Every day | ORAL | Status: DC
  Administered 2014-08-02 – 2014-08-06 (×5): 2.5 mg via ORAL
  Filled 2014-08-02 (×5): qty 1

## 2014-08-02 MED ORDER — ATORVASTATIN CALCIUM 20 MG PO TABS
20.0000 mg | ORAL_TABLET | Freq: Every day | ORAL | Status: DC
  Administered 2014-08-02 – 2014-08-05 (×4): 20 mg via ORAL
  Filled 2014-08-02 (×5): qty 1

## 2014-08-02 NOTE — Progress Notes (Signed)
Subjective:  No chest pain, no dizziness  Objective:   Vital Signs : Filed Vitals:   08/02/14 0345 08/02/14 0800 08/02/14 0848 08/02/14 1200  BP: 110/68 116/74 116/74 96/72  Pulse: 55 95 67   Temp: 98.9 F (37.2 C) 98.4 F (36.9 C)  98.4 F (36.9 C)  TempSrc: Oral Oral  Oral  Resp: 9 17    Height:      Weight:      SpO2: 94% 95%      Intake/Output from previous day:  Intake/Output Summary (Last 24 hours) at 08/02/14 1227 Last data filed at 08/02/14 1006  Gross per 24 hour  Intake 666.17 ml  Output    625 ml  Net  41.17 ml     I/O since admission: +108  Wt Readings from Last 3 Encounters:  07/31/14 176 lb 2.4 oz (79.9 kg)  07/23/14 182 lb 6.4 oz (82.736 kg)  06/04/14 183 lb (83.008 kg)    Medications: . amiodarone  150 mg Intravenous Once  . amiodarone  400 mg Oral Daily  . apixaban  5 mg Oral BID  . carvedilol  3.125 mg Oral BID WC  . sodium chloride  3 mL Intravenous Q12H       Physical Exam:   General appearance: alert, cooperative and no distress Neck: no adenopathy, no JVD, supple, symmetrical, trachea midline and thyroid not enlarged, symmetric, no tenderness/mass/nodules Lungs: clear to auscultation bilaterally Heart: regular rate and rhythm; 1/6 sem Abdomen: soft, non-tender; bowel sounds normal; no masses,  no organomegaly Extremities: no edema, redness or tenderness in the calves or thighs Pulses: 2+ and symmetric Neurologic: Grossly normal   Rate: 59  Rhythm: sinus with occ to frequent PAC's  ECG (independently read by me): Low atrial rhythm at 55  Lab Results:  BMP Latest Ref Rng 08/02/2014 07/31/2014 07/30/2014  Glucose 70 - 99 mg/dL 95 108(H) 125(H)  BUN 6 - 23 mg/dL 15 22 22   Creatinine 0.50 - 1.35 mg/dL 1.29 1.23 1.23  Sodium 135 - 145 mmol/L 138 138 137  Potassium 3.5 - 5.1 mmol/L 3.7 3.9 4.2  Chloride 96 - 112 mmol/L 105 106 106  CO2 19 - 32 mmol/L 25 28 29   Calcium 8.4 - 10.5 mg/dL 8.8 8.7 9.1     CBC Latest Ref Rng  08/02/2014 08/01/2014 07/31/2014  WBC 4.0 - 10.5 K/uL 5.9 6.2 7.6  Hemoglobin 13.0 - 17.0 g/dL 12.9(L) 13.2 13.3  Hematocrit 39.0 - 52.0 % 38.6(L) 39.6 40.0  Platelets 150 - 400 K/uL 169 195 206     No results for input(s): TROPONINI in the last 72 hours.  Invalid input(s): CK, MB  Hepatic Function Panel  Recent Labs  08/02/14 0255  PROT 5.9*  ALBUMIN 3.2*  AST 17  ALT 11  ALKPHOS 37*  BILITOT 1.1    Recent Labs  07/30/14 1440  INR 1.09   BNP (last 3 results)  Recent Labs  08/02/14 0255  BNP 285.1*    ProBNP (last 3 results) No results for input(s): PROBNP in the last 8760 hours.   Lipid Panel     Component Value Date/Time   CHOL 152 05/21/2013 0951   TRIG 83 05/21/2013 0951   HDL 42 05/21/2013 0951   CHOLHDL 3.6 05/21/2013 0951   VLDL 17 05/21/2013 0951   LDLCALC 93 05/21/2013 0951      Imaging:  Cardiac Studies: 2Decho 05/2014: Impressions:  - Mild LVH with LVEF approximately 50-55% in the setting of frequent ventricular  ectopy. There is mid to basal inferolateral hypokinesis and grade 1 diastolic dysfunction. Sclerotic aortic valve with trivial aortic regurgitation. Mildly ectatic aortic root. Trivial tricuspid regurgitation with PASP 27 mmHg.  No results found. Cardiac Cath 07/31/2014 Procedural Findings:  Hemodynamics: AO: 110/59 mmHg LV: 105 over 1 mmHg LVEDP: 11 mmHg  Coronary angiography: Coronary dominance: Right   Left Main: Normal in size with minor irregularities. The vessel is mildly calcified.  Left Anterior Descending (LAD): Heavily calcified especially proximally. The vessel has mild diffuse atherosclerosis with no obstructive disease.  1st diagonal (D1): Medium in size with 50% ostial stenosis.  2nd diagonal (D2): Large in size with 50% proximal stenosis. This gives to normal size branches.  3rd diagonal (D3): Very small in size.  Circumflex (LCx): Normal in size with 60-70% ostial stenosis. There  is 20% diffuse disease in the midsegment.  1st obtuse marginal: Small in size with minor irregularities.  2nd obtuse marginal: Medium in size with minor irregularities.  3rd obtuse marginal: Normal in size with minor irregularities.  Ramus Intermedius: Medium in size with 60% ostial stenosis.  Right Coronary Artery: Large in size and aneurysmal throughout its segment. Its tortuous proximally. There is sluggish flow throughout the vessel with possible layered thrombus distally.  Posterior descending artery: Aneurysmal proximally with no significant stenosis.  Posterior AV segment: Normal in size with mild diffuse disease.  Left ventriculography: Left ventricular systolic function is moderately to severely reduced , LVEF is estimated at 30-35 %, there is no significant mitral regurgitation   Final Conclusions:  1. 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 Borderline significant ostial left circumflex stenosis. Otherwise no evidence of obstructive coronary artery disease. 2. Moderately to severely reduced LV systolic function with an ejection fraction of 30-35%. Normal left ventricular end-diastolic pressure.  Recommendations:  Recommend medical therapy for coronary artery disease. The stenosis in the ostial left circumflex is not optimal for PCI due to heavy calcifications at the ostium of the LAD and having to jail a ramus branch. Anticoagulation for atrial fibrillation with a NOAC can be initiated tomorrow. Recommend medical therapy for cardiomyopathy.   Assessment/Plan:   Active Problems:   Atrial fibrillation with RVR  1. CAD cath findings as noted; medical therapy.  2. Cardiomyopathy with cath EF 30 - 35% decreased from Jan 2016 50 - 55% on echo.  Low dose coreg started yesterday, will try to add very low dose lisinopril at 2.5 mg if BP allows.  3. AF with RVR converted to Sinus rhythm on iv amiodarone;   IV amiodarone dc yesterday, now on oral 400 mg daily  No further NSVT  4. Anticoagulation:  Pt today tells me that he had an MRI last week in [place] on 07/24/14 and was told that he had had a small stroke. Therefore Chadsvasc score is 6 and unadjusted Ischemic Stroke Rate (% per year) is equal to 9.7 % stroke rate/year from a score of 6. Above score calculated as 1 point each if present [CHF, HTN, DM, Vascular=MI/PAD/Aortic Plaque, Age if 76-74, or Male. Above score calculated as 2 points each if present [Age > 75, or Stroke/TIA/TE] Eliquis started yesterday at 5 mg bid.  5. Hyperlipidemia: will add atorvastatin at 20 initially  Will transfer to telemetry today; ? Dc tomorrow if stable     Troy Sine, MD, [facility] 08/02/2014, 12:27 PM

## 2014-08-03 DIAGNOSIS — R55 Syncope and collapse: Secondary | ICD-10-CM

## 2014-08-03 DIAGNOSIS — I495 Sick sinus syndrome: Secondary | ICD-10-CM

## 2014-08-03 DIAGNOSIS — I5022 Chronic systolic (congestive) heart failure: Secondary | ICD-10-CM

## 2014-08-03 LAB — LIPID PANEL
CHOLESTEROL: 138 mg/dL (ref 0–200)
HDL: 33 mg/dL — AB (ref >39)
LDL Cholesterol: 90 mg/dL (ref 0–99)
Total CHOL/HDL Ratio: 4.2 RATIO
Triglycerides: 73 mg/dL (ref <150)
VLDL: 15 mg/dL (ref 0–40)

## 2014-08-03 LAB — CBC
HEMATOCRIT: 38.5 % — AB (ref 39.0–52.0)
HEMOGLOBIN: 13.1 g/dL (ref 13.0–17.0)
MCH: 31.9 pg (ref 26.0–34.0)
MCHC: 34 g/dL (ref 30.0–36.0)
MCV: 93.7 fL (ref 78.0–100.0)
PLATELETS: 171 10*3/uL (ref 150–400)
RBC: 4.11 MIL/uL — ABNORMAL LOW (ref 4.22–5.81)
RDW: 14.1 % (ref 11.5–15.5)
WBC: 7 10*3/uL (ref 4.0–10.5)

## 2014-08-03 MED ORDER — SPIRONOLACTONE 12.5 MG HALF TABLET
12.5000 mg | ORAL_TABLET | Freq: Every day | ORAL | Status: DC
  Administered 2014-08-03 – 2014-08-06 (×4): 12.5 mg via ORAL
  Filled 2014-08-03 (×4): qty 1

## 2014-08-03 NOTE — Progress Notes (Signed)
Patient ID: SERGEY ISHLER, male   DOB: 06-Sep-1932, 81 y.o.   MRN: [id number]   SUBJECTIVE: No complaints today.  No chest pain.  Remains in NSR.   Scheduled Meds: . amiodarone  150 mg Intravenous Once  . amiodarone  400 mg Oral Daily  . apixaban  5 mg Oral BID  . atorvastatin  20 mg Oral q1800  . carvedilol  3.125 mg Oral BID WC  . lisinopril  2.5 mg Oral Daily  . sodium chloride  3 mL Intravenous Q12H  . spironolactone  12.5 mg Oral Daily   Continuous Infusions:  PRN Meds:.sodium chloride, acetaminophen **OR** acetaminophen, ondansetron **OR** ondansetron (ZOFRAN) IV, oxyCODONE, senna-docusate, sodium chloride    Filed Vitals:   08/02/14 2013 08/03/14 0010 08/03/14 0400 08/03/14 0750  BP: 106/61 85/61 89/55  93/55  Pulse: 60 57 51   Temp: 98.8 F (37.1 C) 98.2 F (36.8 C) 98.6 F (37 C) 98.5 F (36.9 C)  TempSrc: Oral Oral Oral Oral  Resp:      Height:      Weight:      SpO2: 94% 95% 94% 95%    Intake/Output Summary (Last 24 hours) at 08/03/14 1222 Last data filed at 08/02/14 1800  Gross per 24 hour  Intake    325 ml  Output      0 ml  Net    325 ml    LABS: Basic Metabolic Panel:  Recent Labs  08/02/14 0255  NA 138  K 3.7  CL 105  CO2 25  GLUCOSE 95  BUN 15  CREATININE 1.29  CALCIUM 8.8   Liver Function Tests:  Recent Labs  08/02/14 0255  AST 17  ALT 11  ALKPHOS 37*  BILITOT 1.1  PROT 5.9*  ALBUMIN 3.2*   No results for input(s): LIPASE, AMYLASE in the last 72 hours. CBC:  Recent Labs  08/02/14 0255 08/03/14 0304  WBC 5.9 7.0  HGB 12.9* 13.1  HCT 38.6* 38.5*  MCV 93.2 93.7  PLT 169 171   Cardiac Enzymes: No results for input(s): CKTOTAL, CKMB, CKMBINDEX, TROPONINI in the last 72 hours. BNP: Invalid input(s): POCBNP D-Dimer: No results for input(s): DDIMER in the last 72 hours. Hemoglobin A1C: No results for input(s): HGBA1C in the last 72 hours. Fasting Lipid Panel:  Recent Labs  08/03/14 0304  CHOL 138  HDL 33*    LDLCALC 90  TRIG 73  CHOLHDL 4.2   Thyroid Function Tests: No results for input(s): TSH, T4TOTAL, T3FREE, THYROIDAB in the last 72 hours.  Invalid input(s): FREET3 Anemia Panel: No results for input(s): VITAMINB12, FOLATE, FERRITIN, TIBC, IRON, RETICCTPCT in the last 72 hours.  RADIOLOGY: Nm Myocar Multi W/spect W/wall Motion / Ef  07/30/2014   CLINICAL DATA:  81 year old male with a history of syncope and wall motion abnormalities on echocardiography referred for an ischemic evaluation.  EXAM: MYOCARDIAL IMAGING WITH SPECT (REST AND PHARMACOLOGIC-STRESS)  GATED LEFT VENTRICULAR WALL MOTION STUDY  LEFT VENTRICULAR EJECTION FRACTION  TECHNIQUE: Standard myocardial SPECT imaging was performed after resting intravenous injection of 10 mCi Tc-4m sestamibi. Subsequently, intravenous infusion of Lexiscan was performed under the supervision of the Cardiology staff. At peak effect of the drug, 30 mCi Tc-48m sestamibi was injected intravenously and standard myocardial SPECT imaging was performed. Quantitative gated imaging was also performed to evaluate left ventricular wall motion, and estimate left ventricular ejection fraction.  COMPARISON:  None.  FINDINGS: Stress/ECG data: The patient was stressed according to the Lexiscan protocol. The heart  rate ranged from 112 to 146 beats per min. The blood pressure averaged 118/91. No chest pain was reported. The patient was found to be in rapid atrial fibrillation with frequent PVCs. With stress, there were multiple episodes of non sustained ventricular tachycardia with occasional compensatory pauses lasting 2.5 seconds. The patient felt dizzy and lightheaded. He was subsequently admitted to the hospital for further observation.  Perfusion: There was a moderate sized area of mild to moderately decreased uptake in the inferolateral, mid inferoseptal, and inferoapical walls with no reversibility, suggestive of myocardial scar. No ischemic territories were seen.  Wall  Motion: Inferior, inferoseptal, and inferoapical hypokinesis. No left ventricular dilation.  Left Ventricular Ejection Fraction: 27 %  End diastolic volume 809 ml  End systolic volume 83 ml  IMPRESSION: 1. Moderate degree of myocardial scar in the inferolateral, mid inferoseptal, and inferoapical walls. No ischemic zones seen.  2. Inferior, inferoseptal, and inferoapical hypokinesis.  3. Left ventricular ejection fraction 27%  4. High-risk stress test findings* based upon reduced LV systolic function. However, given arrhythmia, would recommend echocardiography for a more accurate assessment of LV systolic function and regional wall motion if indicated.  *2012 Appropriate Use Criteria for Coronary Revascularization Focused Update: J Am Coll Cardiol. 9833;82(5):053-976. http://[email or web address]   Electronically Signed   By: Kate Sable   On: 07/30/2014 15:18    PHYSICAL EXAM General: NAD Neck: No JVD, no thyromegaly or thyroid nodule.  Lungs: Clear to auscultation bilaterally with normal respiratory effort. CV: Nondisplaced PMI.  Heart regular S1/S2, no S3/S4, no murmur.  No peripheral edema.  No carotid bruit.  Normal pedal pulses.  Abdomen: Soft, nontender, no hepatosplenomegaly, no distention.  Neurologic: Alert and oriented x 3.  Psych: Normal affect. Extremities: No clubbing or cyanosis.   TELEMETRY: Reviewed telemetry pt in NSR  ASSESSMENT AND PLAN: 81 yo with history of nonischemic cardiomyopathy was admitted after high risk stress test for cardiac cath.  He had had an episode of presyncope in 2/16 investigated at [facility] and was referred for stress test by Dr Domenic Polite.  1. CAD: Scar on Cardiolite 07/30/14.  He had LHC this week showing aneurysmal RCA with nonobstructive suspected chronic thrombus distally and 60-70% ostial LCx stenosis.  This was managed medically.  Continue statin, no ASA with Eliquis use.  2. Atrial fibrillation: Paroxysmal.  Noted  during stress Cardiolite earlier this week. He is in NSR.  Continue Eliquis and low-dose Coreg.  NSR maintenance with amiodarone.  3. Cardiomyopathy: Probably nonischemic.  This is out of proportion to the degree of coronary disease.  EF 30-35% by LV-gram, 27% by Cardiolite.  Interestingly, 1/16 echo was read as showing EF 50%.  Prior study was Cardiolite from 2013 with EF 30%.  He is euvolemic.   - Continue current low doses of Coreg, spironolactone, and lisinopril.  BP soft.  4. NSVT: Noted during stress test.  Also reports episode of profound presyncope for < 30 seconds in 2/16.  Was seen at [facility].  I am concerned about his risk for Cardiac arrest with decreased EF and presyncopal episode + NSVT noted during stress test.  He is elderly at 60.  Answer here may be to continue amiodarone and treat cardiomyopathy medically with followup echo but would like EP evaluation given complexity.  Talked to Dr Caryl Comes.  5. If Dr Caryl Comes does not plan procedure, would let patient go home this evening on current meds. Will need followup with Dr Domenic Polite in 7-10 days.   Loralie Champagne 08/03/2014 12:32  PM

## 2014-08-03 NOTE — Consult Note (Signed)
ELECTROPHYSIOLOGY CONSULT NOTE  Patient ID: Alejandro [name], MRN: [id number], DOB/AGE: 1932-12-30 81 y.o. Admit date: 07/30/2014 Date of Consult: 08/03/2014  Primary Physician: Glenda Chroman., MD Primary Cardiologist:SM  Chief Complaint: AFIB VT presyncope and ischemic/nonischemic cardio myopathy   HPI Alejandro [name] is a 81 y.o. male  I am asked to see because of presyncope in the context of a clarified cardiomyopathy and a history of atrial fibrillation and nonsustained ventricular tachycardia.  The patient did relatively well and has had a variety of illnesses over the last few months including urinary tract infections, diverticulitis. There is no significant decrease in functional capacity over the last 3-6 months.  For reasons that are not entirely clear he underwent an echocardiogram 2013-14 which demonstrated ejection fraction of 30-40% with an inferior scar versus attenuation. He underwent a reevaluation in the last 2 weeks following 2 episodes of presyncope 1 which took him to his knees and the other which just was profound dizziness. They're unassociated with palpitations. He was scheduled for a Myoview scan which was interrupted. The resting images showed an ejection fraction of 25% with an inferior wall motion abnormality. When he was being hooked up for treadmill testing, apparently, the tracings demonstrated rapid atrial fibrillation and nonsustained ventricular tachycardia which he was unaware. Amiodarone was started he was transferred to [facility]. At some point he converted to sinus rhythm and has been notably bradycardic.  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  Because of the aforementioned constellation were asked to see him today. Unfortunately, the strips from [facility] are not available for review    Past Medical History  Diagnosis  Date  . Essential hypertension   . Secondary cardiomyopathy     LVEF 40-45% January 2014      Surgical History:  Past Surgical History  Procedure Laterality Date  . Laparoscopic appendectomy  November 2015    Dr. [name]  . Left heart catheterization with coronary angiogram N/A 07/31/2014    Procedure: LEFT HEART CATHETERIZATION WITH CORONARY ANGIOGRAM;  Surgeon: [name], MD;  Location: [facility];  Service: Cardiovascular;  Laterality: N/A;     Home Meds: Prior to Admission medications   Medication Sig Start Date End Date Taking? Authorizing Provider  aspirin EC 81 MG tablet Take 1 tablet (81 mg total) by mouth daily. Patient not taking: Reported on 07/30/2014 07/23/14   Satira Sark, MD    Inpatient Medications:  . amiodarone  150 mg Intravenous Once  . amiodarone  400 mg Oral Daily  . apixaban  5 mg Oral BID  . atorvastatin  20 mg Oral q1800  . carvedilol  3.125 mg Oral BID WC  . lisinopril  2.5 mg Oral Daily  . sodium chloride  3 mL Intravenous Q12H  . spironolactone  12.5 mg Oral Daily     Allergies: No Known Allergies  History   Social History  . Marital Status: Married    Spouse Name: N/A  . Number of Children: N/A  . Years of Education: N/A   Occupational History  . Retired    Social History Main Topics  . Smoking status: Never Smoker   . Smokeless tobacco: Never Used  . Alcohol Use: No  . Drug Use: No  . Sexual Activity: Not on file   Other Topics Concern  . Not on file   Social History Narrative  Family History  Problem Relation Age of Onset  . Heart disease Father     Diagnosed in his 24s    ROS:  Please see the history of present illness.     All other systems reviewed and negative.    Physical Exam   Blood pressure 114/78, pulse 51, temperature 98.9 F (37.2 C), temperature source Oral, resp. rate 17, height 5\' 8"  (1.727 m), weight 176 lb 2.4 oz (79.9 kg), SpO2 94 %. General: Well developed, well nourished male in no  acute distress. Head: Normocephalic, atraumatic, sclera non-icteric, no xanthomas, nares are without discharge. EENT: normal Lymph Nodes:  none Back: without scoliosis/kyphosis  no CVA tendersness Neck: Negative for carotid bruits. JVD not elevated. Lungs: Clear bilaterally to auscultation without wheezes, rales, or rhonchi. Breathing is unlabored. Heart: RRR with S1 S2. No  murmur , rubs, or gallops appreciated. Abdomen: Soft, non-tender, non-distended with normoactive bowel sounds. No hepatomegaly. No rebound/guarding. No obvious abdominal masses. Msk:  Strength and tone appear normal for age. Extremities: No clubbing or cyanosis. N  edema.  Distal pedal pulses are 2+ and equal bilaterally. Skin: Warm and Dry Neuro: Alert and oriented X 3. CN III-XII intact Grossly normal sensory and motor function . Psych:  Responds to questions appropriately with a normal affect.      Labs: Cardiac Enzymes No results for input(s): CKTOTAL, CKMB, TROPONINI in the last 72 hours. CBC Lab Results  Component Value Date   WBC 7.0 08/03/2014   HGB 13.1 08/03/2014   HCT 38.5* 08/03/2014   MCV 93.7 08/03/2014   PLT 171 08/03/2014   PROTIME: No results for input(s): LABPROT, INR in the last 72 hours. Chemistry  Recent Labs Lab 08/02/14 0255  NA 138  K 3.7  CL 105  CO2 25  BUN 15  CREATININE 1.29  CALCIUM 8.8  PROT 5.9*  BILITOT 1.1  ALKPHOS 37*  ALT 11  AST 17  GLUCOSE 95   Lipids Lab Results  Component Value Date   CHOL 138 08/03/2014   HDL 33* 08/03/2014   LDLCALC 90 08/03/2014   TRIG 73 08/03/2014   BNP No results found for: PROBNP Miscellaneous No results found for: DDIMER  Radiology/Studies:  Nm Myocar Multi W/spect W/wall Motion / Ef  07/30/2014   CLINICAL DATA:  81 year old male with a history of syncope and wall motion abnormalities on echocardiography referred for an ischemic evaluation.  EXAM: MYOCARDIAL IMAGING WITH SPECT (REST AND PHARMACOLOGIC-STRESS)  GATED LEFT  VENTRICULAR WALL MOTION STUDY  LEFT VENTRICULAR EJECTION FRACTION  TECHNIQUE: Standard myocardial SPECT imaging was performed after resting intravenous injection of 10 mCi Tc-5m sestamibi. Subsequently, intravenous infusion of Lexiscan was performed under the supervision of the Cardiology staff. At peak effect of the drug, 30 mCi Tc-50m sestamibi was injected intravenously and standard myocardial SPECT imaging was performed. Quantitative gated imaging was also performed to evaluate left ventricular wall motion, and estimate left ventricular ejection fraction.  COMPARISON:  None.  FINDINGS: Stress/ECG data: The patient was stressed according to the Lexiscan protocol. The heart rate ranged from 112 to 146 beats per min. The blood pressure averaged 118/91. No chest pain was reported. The patient was found to be in rapid atrial fibrillation with frequent PVCs. With stress, there were multiple episodes of non sustained ventricular tachycardia with occasional compensatory pauses lasting 2.5 seconds. The patient felt dizzy and lightheaded. He was subsequently admitted to the hospital for further observation.  Perfusion: There was a moderate sized area of mild to  moderately decreased uptake in the inferolateral, mid inferoseptal, and inferoapical walls with no reversibility, suggestive of myocardial scar. No ischemic territories were seen.  Wall Motion: Inferior, inferoseptal, and inferoapical hypokinesis. No left ventricular dilation.  Left Ventricular Ejection Fraction: 27 %  End diastolic volume 017 ml  End systolic volume 83 ml  IMPRESSION: 1. Moderate degree of myocardial scar in the inferolateral, mid inferoseptal, and inferoapical walls. No ischemic zones seen.  2. Inferior, inferoseptal, and inferoapical hypokinesis.  3. Left ventricular ejection fraction 27%  4. High-risk stress test findings* based upon reduced LV systolic function. However, given arrhythmia, would recommend echocardiography for a more accurate  assessment of LV systolic function and regional wall motion if indicated.  *2012 Appropriate Use Criteria for Coronary Revascularization Focused Update: J Am Coll Cardiol. 4944;96(7):591-638. http://content.airportbarriers.com.aspx?articleid=1201161   Electronically Signed   By: Kate Sable   On: 07/30/2014 15:18    telemetry sinus rhythm-bradycardia  EKG: SR 55  [id number]*   carotid massage was negative   Assessment and Plan:  Presyncope  Cardiomyopathy with a presumed ischemic/scar component ejection fraction 25-35% with perfusion defect evidenced by Myoview scanning  Atrial fibrillation/nonsustained ventricular tachycardia by report  Sinus bradycardia  Prior stroke  The patient has nonsustained ventricular tachycardia and episodes of presyncope. These occur in the context of a cardiomyopathy which apparently has a scar component by Myoview scanning. This suggests a possible substrate of reentry which is elucidatable potentially by electro physiological testing.  In the event that he were inducible would undertake dual-chamber device implantation given his bradycardia and is known tachyarrhythmias. In the event that he were noninducible, I would anticipate the implantation of a loop recorder.  Given his bradycardia and the introduction of amiodarone, we will stop his carvedilol.  I reviewed the above with him extensively and he is agreeable to that plan   Virl Axe

## 2014-08-04 LAB — BASIC METABOLIC PANEL
Anion gap: 5 (ref 5–15)
BUN: 17 mg/dL (ref 6–23)
CO2: 24 mmol/L (ref 19–32)
CREATININE: 1.22 mg/dL (ref 0.50–1.35)
Calcium: 8.8 mg/dL (ref 8.4–10.5)
Chloride: 107 mmol/L (ref 96–112)
GFR calc non Af Amer: 55 mL/min — ABNORMAL LOW (ref >90)
GFR, EST AFRICAN AMERICAN: 63 mL/min — AB (ref >90)
Glucose, Bld: 102 mg/dL — ABNORMAL HIGH (ref 70–99)
Potassium: 3.7 mmol/L (ref 3.5–5.1)
Sodium: 136 mmol/L (ref 135–145)

## 2014-08-04 LAB — CBC
HCT: 38.8 % — ABNORMAL LOW (ref 39.0–52.0)
Hemoglobin: 12.9 g/dL — ABNORMAL LOW (ref 13.0–17.0)
MCH: 30.9 pg (ref 26.0–34.0)
MCHC: 33.2 g/dL (ref 30.0–36.0)
MCV: 92.8 fL (ref 78.0–100.0)
Platelets: 180 10*3/uL (ref 150–400)
RBC: 4.18 MIL/uL — ABNORMAL LOW (ref 4.22–5.81)
RDW: 14.1 % (ref 11.5–15.5)
WBC: 6.9 10*3/uL (ref 4.0–10.5)

## 2014-08-04 MED ORDER — CHLORHEXIDINE GLUCONATE 4 % EX LIQD
60.0000 mL | Freq: Once | CUTANEOUS | Status: AC
  Administered 2014-08-05: 4 via TOPICAL
  Filled 2014-08-04: qty 60

## 2014-08-04 MED ORDER — CEFAZOLIN SODIUM-DEXTROSE 2-3 GM-% IV SOLR
2.0000 g | INTRAVENOUS | Status: DC
  Filled 2014-08-04: qty 50

## 2014-08-04 MED ORDER — SODIUM CHLORIDE 0.9 % IR SOLN
80.0000 mg | Status: DC
  Filled 2014-08-04: qty 2

## 2014-08-04 MED ORDER — SODIUM CHLORIDE 0.9 % IV SOLN: INTRAVENOUS | Status: DC

## 2014-08-04 MED ORDER — CHLORHEXIDINE GLUCONATE 4 % EX LIQD
60.0000 mL | Freq: Once | CUTANEOUS | Status: AC
Start: 2014-08-05 — End: 2014-08-05
  Administered 2014-08-05: 4 via TOPICAL
  Filled 2014-08-04: qty 60

## 2014-08-04 MED ORDER — SODIUM CHLORIDE 0.9 % IV SOLN
INTRAVENOUS | Status: DC
  Administered 2014-08-05: 06:00:00 via INTRAVENOUS

## 2014-08-04 NOTE — Progress Notes (Signed)
Patient ID: Alejandro [name], male   DOB: 08-30-1932, 81 y.o.   MRN: [id number]   SUBJECTIVE: No complaints today.  No chest pain.  Remains in sinus bradycardia.  Seen by Dr Caryl Comes yesterday, plan for EP study tomorrow.   Scheduled Meds: . amiodarone  150 mg Intravenous Once  . amiodarone  400 mg Oral Daily  . apixaban  5 mg Oral BID  . atorvastatin  20 mg Oral q1800  . lisinopril  2.5 mg Oral Daily  . sodium chloride  3 mL Intravenous Q12H  . spironolactone  12.5 mg Oral Daily   Continuous Infusions:  PRN Meds:.sodium chloride, acetaminophen **OR** acetaminophen, ondansetron **OR** ondansetron (ZOFRAN) IV, oxyCODONE, senna-docusate, sodium chloride    Filed Vitals:   08/03/14 1656 08/03/14 1935 08/03/14 2030 08/04/14 0531  BP: 114/78 111/62 111/72 107/71  Pulse:  55 53 51  Temp: 98.9 F (37.2 C) 98.3 F (36.8 C) 98.2 F (36.8 C) 98 F (36.7 C)  TempSrc: Oral Oral Oral Oral  Resp:   18 18  Height:      Weight:      SpO2: 94% 92% 97% 96%    Intake/Output Summary (Last 24 hours) at 08/04/14 1224 Last data filed at 08/03/14 1500  Gross per 24 hour  Intake    120 ml  Output      0 ml  Net    120 ml    LABS: Basic Metabolic Panel:  Recent Labs  08/02/14 0255 08/04/14 0441  NA 138 136  K 3.7 3.7  CL 105 107  CO2 25 24  GLUCOSE 95 102*  BUN 15 17  CREATININE 1.29 1.22  CALCIUM 8.8 8.8   Liver Function Tests:  Recent Labs  08/02/14 0255  AST 17  ALT 11  ALKPHOS 37*  BILITOT 1.1  PROT 5.9*  ALBUMIN 3.2*   No results for input(s): LIPASE, AMYLASE in the last 72 hours. CBC:  Recent Labs  08/03/14 0304 08/04/14 0441  WBC 7.0 6.9  HGB 13.1 12.9*  HCT 38.5* 38.8*  MCV 93.7 92.8  PLT 171 180   Cardiac Enzymes: No results for input(s): CKTOTAL, CKMB, CKMBINDEX, TROPONINI in the last 72 hours. BNP: Invalid input(s): POCBNP D-Dimer: No results for input(s): DDIMER in the last 72 hours. Hemoglobin A1C: No results for input(s): HGBA1C in the last 72  hours. Fasting Lipid Panel:  Recent Labs  08/03/14 0304  CHOL 138  HDL 33*  LDLCALC 90  TRIG 73  CHOLHDL 4.2   Thyroid Function Tests: No results for input(s): TSH, T4TOTAL, T3FREE, THYROIDAB in the last 72 hours.  Invalid input(s): FREET3 Anemia Panel: No results for input(s): VITAMINB12, FOLATE, FERRITIN, TIBC, IRON, RETICCTPCT in the last 72 hours.  RADIOLOGY: Nm Myocar Multi W/spect W/wall Motion / Ef  07/30/2014   CLINICAL DATA:  81 year old male with a history of syncope and wall motion abnormalities on echocardiography referred for an ischemic evaluation.  EXAM: MYOCARDIAL IMAGING WITH SPECT (REST AND PHARMACOLOGIC-STRESS)  GATED LEFT VENTRICULAR WALL MOTION STUDY  LEFT VENTRICULAR EJECTION FRACTION  TECHNIQUE: Standard myocardial SPECT imaging was performed after resting intravenous injection of 10 mCi Tc-41m sestamibi. Subsequently, intravenous infusion of Lexiscan was performed under the supervision of the Cardiology staff. At peak effect of the drug, 30 mCi Tc-32m sestamibi was injected intravenously and standard myocardial SPECT imaging was performed. Quantitative gated imaging was also performed to evaluate left ventricular wall motion, and estimate left ventricular ejection fraction.  COMPARISON:  None.  FINDINGS: Stress/ECG  data: The patient was stressed according to the Lexiscan protocol. The heart rate ranged from 112 to 146 beats per min. The blood pressure averaged 118/91. No chest pain was reported. The patient was found to be in rapid atrial fibrillation with frequent PVCs. With stress, there were multiple episodes of non sustained ventricular tachycardia with occasional compensatory pauses lasting 2.5 seconds. The patient felt dizzy and lightheaded. He was subsequently admitted to the hospital for further observation.  Perfusion: There was a moderate sized area of mild to moderately decreased uptake in the inferolateral, mid inferoseptal, and inferoapical walls with no  reversibility, suggestive of myocardial scar. No ischemic territories were seen.  Wall Motion: Inferior, inferoseptal, and inferoapical hypokinesis. No left ventricular dilation.  Left Ventricular Ejection Fraction: 27 %  End diastolic volume 115 ml  End systolic volume 83 ml  IMPRESSION: 1. Moderate degree of myocardial scar in the inferolateral, mid inferoseptal, and inferoapical walls. No ischemic zones seen.  2. Inferior, inferoseptal, and inferoapical hypokinesis.  3. Left ventricular ejection fraction 27%  4. High-risk stress test findings* based upon reduced LV systolic function. However, given arrhythmia, would recommend echocardiography for a more accurate assessment of LV systolic function and regional wall motion if indicated.  *2012 Appropriate Use Criteria for Coronary Revascularization Focused Update: J Am Coll Cardiol. 7262;03(5):597-416. http://content.[email or web address].aspx?articleid=1201161   Electronically Signed   By: Kate Sable   On: 07/30/2014 15:18    PHYSICAL EXAM General: NAD Neck: No JVD, no thyromegaly or thyroid nodule.  Lungs: Clear to auscultation bilaterally with normal respiratory effort. CV: Nondisplaced PMI.  Heart regular S1/S2, no S3/S4, no murmur.  No peripheral edema.  No carotid bruit.  Normal pedal pulses.  Abdomen: Soft, nontender, no hepatosplenomegaly, no distention.  Neurologic: Alert and oriented x 3.  Psych: Normal affect. Extremities: No clubbing or cyanosis.   TELEMETRY: Reviewed telemetry pt in NSR  ASSESSMENT AND PLAN: 81 yo with history of nonischemic cardiomyopathy was admitted after high risk stress test for cardiac cath.  He had had an episode of presyncope in 2/16 investigated at [facility] and was referred for stress test by Dr Domenic Polite.  1. CAD: Scar on Cardiolite 07/30/14.  He had LHC this week showing aneurysmal RCA with nonobstructive suspected chronic thrombus distally and 60-70% ostial LCx stenosis.  This was managed medically.   Continue statin, no ASA with Eliquis use.  2. Atrial fibrillation: Paroxysmal.  Noted during stress Cardiolite earlier this week. He is in NSR.  Continue Eliquis and low-dose Coreg.  NSR maintenance with amiodarone.  3. Cardiomyopathy: Probably nonischemic.  This is out of proportion to the degree of coronary disease.  EF 30-35% by LV-gram, 27% by Cardiolite.  Interestingly, 1/16 echo was read as showing EF 50%.  Prior study was Cardiolite from 2013 with EF 30%.  He is euvolemic.   - Continue current low doses of spironolactone and lisinopril.  He is off Coreg now with bradycardia.  4. NSVT: Noted during stress test.  Also reports episode of profound presyncope for < 30 seconds in 2/16.  Was seen at [facility] [address]  I am concerned about his risk for Cardiac arrest with decreased EF and presyncopal episode + NSVT noted during stress test.  He was seen by Dr Caryl Comes, plan for EP study with dual chamber ICD if inducible and ILR if not inducible.  Hold Eliquis tonight and tomorrow morning.   Loralie Champagne 08/04/2014 12:24 PM

## 2014-08-05 ENCOUNTER — Encounter (HOSPITAL_COMMUNITY)
Admission: AD | Disposition: A | Discharge status: Home / Self Care | Payer: Self-pay | Source: Ambulatory Visit | Attending: Cardiovascular Disease

## 2014-08-05 ENCOUNTER — Encounter (HOSPITAL_COMMUNITY): Payer: Self-pay | Admitting: Internal Medicine

## 2014-08-05 DIAGNOSIS — I472 Ventricular tachycardia: Secondary | ICD-10-CM

## 2014-08-05 DIAGNOSIS — I420 Dilated cardiomyopathy: Secondary | ICD-10-CM

## 2014-08-05 DIAGNOSIS — I4729 Other ventricular tachycardia: Secondary | ICD-10-CM

## 2014-08-05 DIAGNOSIS — R55 Syncope and collapse: Secondary | ICD-10-CM

## 2014-08-05 HISTORY — PX: ELECTROPHYSIOLOGY STUDY: SHX5467

## 2014-08-05 LAB — CBC
HEMATOCRIT: 40.4 % (ref 39.0–52.0)
Hemoglobin: 13.4 g/dL (ref 13.0–17.0)
MCH: 31.1 pg (ref 26.0–34.0)
MCHC: 33.2 g/dL (ref 30.0–36.0)
MCV: 93.7 fL (ref 78.0–100.0)
Platelets: 183 10*3/uL (ref 150–400)
RBC: 4.31 MIL/uL (ref 4.22–5.81)
RDW: 14.2 % (ref 11.5–15.5)
WBC: 7.4 10*3/uL (ref 4.0–10.5)

## 2014-08-05 LAB — BASIC METABOLIC PANEL
Anion gap: 4 — ABNORMAL LOW (ref 5–15)
BUN: 18 mg/dL (ref 6–23)
CALCIUM: 8.8 mg/dL (ref 8.4–10.5)
CO2: 25 mmol/L (ref 19–32)
CREATININE: 1.29 mg/dL (ref 0.50–1.35)
Chloride: 108 mmol/L (ref 96–112)
GFR, EST AFRICAN AMERICAN: 59 mL/min — AB (ref >90)
GFR, EST NON AFRICAN AMERICAN: 51 mL/min — AB (ref >90)
GLUCOSE: 97 mg/dL (ref 70–99)
Potassium: 4.1 mmol/L (ref 3.5–5.1)
Sodium: 137 mmol/L (ref 135–145)

## 2014-08-05 SURGERY — ELECTROPHYSIOLOGY STUDY

## 2014-08-05 MED ORDER — FENTANYL CITRATE 0.05 MG/ML IJ SOLN
INTRAMUSCULAR | Status: AC
  Filled 2014-08-05: qty 2

## 2014-08-05 MED ORDER — BUPIVACAINE HCL (PF) 0.25 % IJ SOLN
INTRAMUSCULAR | Status: AC
  Filled 2014-08-05: qty 30

## 2014-08-05 MED ORDER — SODIUM CHLORIDE 0.9 % IJ SOLN
3.0000 mL | Freq: Two times a day (BID) | INTRAMUSCULAR | Status: DC
  Administered 2014-08-05: 3 mL via INTRAVENOUS

## 2014-08-05 MED ORDER — MIDAZOLAM HCL 5 MG/5ML IJ SOLN
INTRAMUSCULAR | Status: AC
  Filled 2014-08-05: qty 5

## 2014-08-05 MED ORDER — AMIODARONE HCL 200 MG PO TABS
200.0000 mg | ORAL_TABLET | Freq: Two times a day (BID) | ORAL | Status: DC
  Administered 2014-08-05 – 2014-08-06 (×2): 200 mg via ORAL
  Filled 2014-08-05 (×3): qty 1

## 2014-08-05 MED ORDER — SODIUM CHLORIDE 0.9 % IV SOLN: 250.0000 mL | INTRAVENOUS | Status: DC | PRN

## 2014-08-05 MED ORDER — ONDANSETRON HCL 4 MG/2ML IJ SOLN: 4.0000 mg | Freq: Four times a day (QID) | INTRAMUSCULAR | Status: DC | PRN

## 2014-08-05 MED ORDER — LIDOCAINE-EPINEPHRINE 1 %-1:100000 IJ SOLN
INTRAMUSCULAR | Status: AC
  Filled 2014-08-05: qty 1

## 2014-08-05 MED ORDER — SODIUM CHLORIDE 0.9 % IJ SOLN: 3.0000 mL | INTRAMUSCULAR | Status: DC | PRN

## 2014-08-05 MED ORDER — HEPARIN (PORCINE) IN NACL 2-0.9 UNIT/ML-% IJ SOLN
INTRAMUSCULAR | Status: AC
Start: 2014-08-05 — End: 2014-08-05
  Filled 2014-08-05: qty 500

## 2014-08-05 MED ORDER — OFF THE BEAT BOOK
Freq: Once | Status: AC
  Administered 2014-08-05: 11:00:00
  Filled 2014-08-05: qty 1

## 2014-08-05 MED ORDER — ACETAMINOPHEN 325 MG PO TABS: 650.0000 mg | ORAL_TABLET | ORAL | Status: DC | PRN

## 2014-08-05 NOTE — CV Procedure (Signed)
Alejandro [name] [id number]  [id number]  Preop Dx: nonsustained VT, ischemic cardiomyopathy, presyncope Postop Dx same/   Procedure:EPS with induction of arrhythmia'  Findings no inducible arrhythmia    Plan proceed with Loop recorder insertion  Cx: None   EBL: Minimal    Dictation number [id number]  Virl Axe, MD 08/05/2014 4:27 PM

## 2014-08-05 NOTE — Interval H&P Note (Signed)
History and Physical Interval Note:  08/05/2014 1:53 PM  Alejandro [name]  has presented today for surgery, with the diagnosis of NSVT,decreased EF  The various methods of treatment have been discussed with the patient and family. After consideration of risks, benefits and other options for treatment, the patient has consented to  Procedure(s): ELECTROPHYSIOLOGY STUDY (N/A) as a surgical intervention .  The patient's history has been reviewed, patient examined, no change in status, stable for surgery.  I have reviewed the patient's chart and labs.  Questions were answered to the patient's satisfaction.     Virl Axe

## 2014-08-05 NOTE — CV Procedure (Signed)
Pre op Dx  Presyncope ,VT NS Iscghemic cardiomyopathy Post op Dx    Procedure  Loop Recorder implantation  After routine prep and drape of the left parasternal area, a small incision was created. A Med[employer]onic LINQ Reveal Loop Recorder  Serial Number  [id number] was inserted.    SteriStrip dressing was  applied.  The patient tolerated the procedure without apparent complication.

## 2014-08-05 NOTE — Progress Notes (Signed)
Patient Profile: 81 yo with history of PAF and nonischemic cardiomyopathy with recent presyncope 2/16. Subsequent nuclear stress test was high risk. Transferred from [facility] to [facility] for cardiac cath.  Subjective: No complaints current. He is asymptomatic.  Objective: Vital signs in last 24 hours: Temp:  [98 F (36.7 C)-98.6 F (37 C)] 98 F (36.7 C) (03/07 0351) Pulse Rate:  [55-59] 55 (03/07 0351) Resp:  [17-18] 18 (03/07 0351) BP: (92-125)/(66-73) 115/72 mmHg (03/07 0351) SpO2:  [95 %-97 %] 97 % (03/07 0351) Last BM Date: 08/03/14  Intake/Output from previous day: 03/06 0701 - 03/07 0700 In: 480 [P.O.:480] Out: -  Intake/Output this shift:    Medications Current Facility-Administered Medications  Medication Dose Route Frequency Provider Last Rate Last Dose  . 0.9 %  sodium chloride infusion  250 mL Intravenous PRN Erline Hau, MD      . 0.9 %  sodium chloride infusion   Intravenous Continuous Deboraha Sprang, MD      . 0.9 %  sodium chloride infusion   Intravenous Continuous Deboraha Sprang, MD 50 mL/hr at 08/05/14 0606    . acetaminophen (TYLENOL) tablet 650 mg  650 mg Oral Q6H PRN Erline Hau, MD       Or  . acetaminophen (TYLENOL) suppository 650 mg  650 mg Rectal Q6H PRN Erline Hau, MD      . amiodarone (NEXTERONE) 1.8 mg/mL load via infusion 150 mg  150 mg Intravenous Once [name], MD   150 mg at 07/30/14 1400  . amiodarone (PACERONE) tablet 400 mg  400 mg Oral Daily Troy Sine, MD   400 mg at 08/04/14 1133  . atorvastatin (LIPITOR) tablet 20 mg  20 mg Oral q1800 Troy Sine, MD   20 mg at 08/04/14 1736  . ceFAZolin (ANCEF) IVPB 2 g/50 mL premix  2 g Intravenous On Call Deboraha Sprang, MD      . gentamicin (GARAMYCIN) 80 mg in sodium chloride irrigation 0.9 % 500 mL irrigation  80 mg Irrigation On Call Deboraha Sprang, MD      . lisinopril (PRINIVIL,ZESTRIL) tablet 2.5 mg  2.5 mg Oral Daily Troy Sine, MD    2.5 mg at 08/04/14 1132  . ondansetron (ZOFRAN) tablet 4 mg  4 mg Oral Q6H PRN Erline Hau, MD       Or  . ondansetron [facility]) injection 4 mg  4 mg Intravenous Q6H PRN Erline Hau, MD      . oxyCODONE (Oxy IR/ROXICODONE) immediate release tablet 5 mg  5 mg Oral Q4H PRN Erline Hau, MD      . senna-docusate (Senokot-S) tablet 1 tablet  1 tablet Oral QHS PRN Erline Hau, MD      . sodium chloride 0.9 % injection 3 mL  3 mL Intravenous Q12H Estela Leonie Green, MD   3 mL at 08/04/14 1134  . sodium chloride 0.9 % injection 3 mL  3 mL Intravenous PRN Erline Hau, MD      . spironolactone (ALDACTONE) tablet 12.5 mg  12.5 mg Oral Daily Larey Dresser, MD   12.5 mg at 08/04/14 1132    PE: General appearance: alert, cooperative and no distress Neck: no carotid bruit and no JVD Lungs: clear to auscultation bilaterally Heart: regular rate and rhythm, S1, S2 normal, no murmur, click, rub or gallop Extremities: no LEE Pulses: 2+ and symmetric  Skin: warm and dry Neurologic: Grossly normal  Lab Results:   Recent Labs  08/03/14 0304 08/04/14 0441 08/05/14 0454  WBC 7.0 6.9 7.4  HGB 13.1 12.9* 13.4  HCT 38.5* 38.8* 40.4  PLT 171 180 183   BMET  Recent Labs  08/04/14 0441 08/05/14 0454  NA 136 137  K 3.7 4.1  CL 107 108  CO2 24 25  GLUCOSE 102* 97  BUN 17 18  CREATININE 1.22 1.29  CALCIUM 8.8 8.8   PT/INR No results for input(s): LABPROT, INR in the last 72 hours. Cholesterol  Recent Labs  08/03/14 0304  CHOL 138   Assessment/Plan  Active Problems:   Atrial fibrillation with RVR  1. CAD: Scar on Cardiolite 07/30/14. He had LHC 3/2 demonstrated aneurysmal RCA with nonobstructive suspected chronic thrombus distally and 60-70% ostial LCx stenosis. This was managed medically. Continue statin, no ASA with Eliquis use.   2. Atrial fibrillation: Paroxysmal. Noted during stress Cardiolite earlier  this week. He is in NSR/ sinus brady. Continue Eliquis. NSR maintenance with amiodarone. Coreg on hold given bradycardia (HR in the 50s).   3. Cardiomyopathy: Probably nonischemic. This is out of proportion to the degree of coronary disease. EF 30-35% by LV-gram, 27% by Cardiolite. Interestingly, 1/16 echo was read as showing EF 50%. Prior study was Cardiolite from 2013 with EF 30%. He is euvolemic.Continue current low doses of spironolactone and lisinopril. He is off Coreg now with bradycardia.   4. NSVT: Noted during stress test. Also reports episode of profound presyncope for < 30 seconds in 2/16. Was seen at [facility]. There is concern about his risk for Cardiac arrest with decreased EF and presyncopal episode + NSVT noted during stress test. He was seen by Dr Caryl Comes, plan for EP study today with dual chamber ICD if inducible and ILR if not inducible.     LOS: 6 days    Brittainy M. Ladoris Gene 08/05/2014 8:42 AM  Personally seen and examined. Agree with above. Doing well this AM, no CP, no syncope, no SOB, laying flat Brady RR, CTAB.  Agree no coreg (brady). Still on AMIO. NSVT with syncope.  Await EPS today, Dr. Caryl Comes. ICD possible.   Candee Furbish, MD

## 2014-08-05 NOTE — H&P (View-Only) (Signed)
ELECTROPHYSIOLOGY CONSULT NOTE  Patient ID: Alejandro [name], MRN: [id number], DOB/AGE: 30-Jul-1932 81 y.o. Admit date: 07/30/2014 Date of Consult: 08/03/2014  Primary Physician: Glenda Chroman., MD Primary Cardiologist:SM  Chief Complaint: AFIB VT presyncope and ischemic/nonischemic cardio myopathy   HPI Alejandro [name] is a 81 y.o. male  I am asked to see because of presyncope in the context of a clarified cardiomyopathy and a history of atrial fibrillation and nonsustained ventricular tachycardia.  The patient did relatively well and has had a variety of illnesses over the last few months including urinary tract infections, diverticulitis. There is no significant decrease in functional capacity over the last 3-6 months.  For reasons that are not entirely clear he underwent an echocardiogram 2013-14 which demonstrated ejection fraction of 30-40% with an inferior scar versus attenuation. He underwent a reevaluation in the last 2 weeks following 2 episodes of presyncope 1 which took him to his knees and the other which just was profound dizziness. They're unassociated with palpitations. He was scheduled for a Myoview scan which was interrupted. The resting images showed an ejection fraction of 25% with an inferior wall motion abnormality. When he was being hooked up for treadmill testing, apparently, the tracings demonstrated rapid atrial fibrillation and nonsustained ventricular tachycardia which he was unaware. Amiodarone was started he was transferred to [facility]. At some point he converted to sinus rhythm and has been notably bradycardic.  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  Because of the aforementioned constellation were asked to see him today. Unfortunately, the strips from [facility] are not available for review    Past Medical History  Diagnosis  Date  . Essential hypertension   . Secondary cardiomyopathy     LVEF 40-45% January 2014      Surgical History:  Past Surgical History  Procedure Laterality Date  . Laparoscopic appendectomy  November 2015    Dr. [name]  . Left heart catheterization with coronary angiogram N/A 07/31/2014    Procedure: LEFT HEART CATHETERIZATION WITH CORONARY ANGIOGRAM;  Surgeon: [name], MD;  Location: [place] [facility];  Service: Cardiovascular;  Laterality: N/A;     Home Meds: Prior to Admission medications   Medication Sig Start Date End Date Taking? Authorizing Provider  aspirin EC 81 MG tablet Take 1 tablet (81 mg total) by mouth daily. Patient not taking: Reported on 07/30/2014 07/23/14   Satira Sark, MD    Inpatient Medications:  . amiodarone  150 mg Intravenous Once  . amiodarone  400 mg Oral Daily  . apixaban  5 mg Oral BID  . atorvastatin  20 mg Oral q1800  . carvedilol  3.125 mg Oral BID WC  . lisinopril  2.5 mg Oral Daily  . sodium chloride  3 mL Intravenous Q12H  . spironolactone  12.5 mg Oral Daily     Allergies: No Known Allergies  History   Social History  . Marital Status: Married    Spouse Name: N/A  . Number of Children: N/A  . Years of Education: N/A   Occupational History  . Retired    Social History Main Topics  . Smoking status: Never Smoker   . Smokeless tobacco: Never Used  . Alcohol Use: No  . Drug Use: No  . Sexual Activity: Not on file   Other Topics Concern  . Not on file   Social History Narrative  Family History  Problem Relation Age of Onset  . Heart disease Father     Diagnosed in his 42s    ROS:  Please see the history of present illness.     All other systems reviewed and negative.    Physical Exam   Blood pressure 114/78, pulse 51, temperature 98.9 F (37.2 C), temperature source Oral, resp. rate 17, height 5\' 8"  (1.727 m), weight 176 lb 2.4 oz (79.9 kg), SpO2 94 %. General: Well developed, well nourished male in no  acute distress. Head: Normocephalic, atraumatic, sclera non-icteric, no xanthomas, nares are without discharge. EENT: normal Lymph Nodes:  none Back: without scoliosis/kyphosis  no CVA tendersness Neck: Negative for carotid bruits. JVD not elevated. Lungs: Clear bilaterally to auscultation without wheezes, rales, or rhonchi. Breathing is unlabored. Heart: RRR with S1 S2. No  murmur , rubs, or gallops appreciated. Abdomen: Soft, non-tender, non-distended with normoactive bowel sounds. No hepatomegaly. No rebound/guarding. No obvious abdominal masses. Msk:  Strength and tone appear normal for age. Extremities: No clubbing or cyanosis. N  edema.  Distal pedal pulses are 2+ and equal bilaterally. Skin: Warm and Dry Neuro: Alert and oriented X 3. CN III-XII intact Grossly normal sensory and motor function . Psych:  Responds to questions appropriately with a normal affect.      Labs: Cardiac Enzymes No results for input(s): CKTOTAL, CKMB, TROPONINI in the last 72 hours. CBC Lab Results  Component Value Date   WBC 7.0 08/03/2014   HGB 13.1 08/03/2014   HCT 38.5* 08/03/2014   MCV 93.7 08/03/2014   PLT 171 08/03/2014   PROTIME: No results for input(s): LABPROT, INR in the last 72 hours. Chemistry  Recent Labs Lab 08/02/14 0255  NA 138  K 3.7  CL 105  CO2 25  BUN 15  CREATININE 1.29  CALCIUM 8.8  PROT 5.9*  BILITOT 1.1  ALKPHOS 37*  ALT 11  AST 17  GLUCOSE 95   Lipids Lab Results  Component Value Date   CHOL 138 08/03/2014   HDL 33* 08/03/2014   LDLCALC 90 08/03/2014   TRIG 73 08/03/2014   BNP No results found for: PROBNP Miscellaneous No results found for: DDIMER  Radiology/Studies:  Nm Myocar Multi W/spect W/wall Motion / Ef  07/30/2014   CLINICAL DATA:  81 year old male with a history of syncope and wall motion abnormalities on echocardiography referred for an ischemic evaluation.  EXAM: MYOCARDIAL IMAGING WITH SPECT (REST AND PHARMACOLOGIC-STRESS)  GATED LEFT  VENTRICULAR WALL MOTION STUDY  LEFT VENTRICULAR EJECTION FRACTION  TECHNIQUE: Standard myocardial SPECT imaging was performed after resting intravenous injection of 10 mCi Tc-40m sestamibi. Subsequently, intravenous infusion of Lexiscan was performed under the supervision of the Cardiology staff. At peak effect of the drug, 30 mCi Tc-1m sestamibi was injected intravenously and standard myocardial SPECT imaging was performed. Quantitative gated imaging was also performed to evaluate left ventricular wall motion, and estimate left ventricular ejection fraction.  COMPARISON:  None.  FINDINGS: Stress/ECG data: The patient was stressed according to the Lexiscan protocol. The heart rate ranged from 112 to 146 beats per min. The blood pressure averaged 118/91. No chest pain was reported. The patient was found to be in rapid atrial fibrillation with frequent PVCs. With stress, there were multiple episodes of non sustained ventricular tachycardia with occasional compensatory pauses lasting 2.5 seconds. The patient felt dizzy and lightheaded. He was subsequently admitted to the hospital for further observation.  Perfusion: There was a moderate sized area of mild to  moderately decreased uptake in the inferolateral, mid inferoseptal, and inferoapical walls with no reversibility, suggestive of myocardial scar. No ischemic territories were seen.  Wall Motion: Inferior, inferoseptal, and inferoapical hypokinesis. No left ventricular dilation.  Left Ventricular Ejection Fraction: 27 %  End diastolic volume 038 ml  End systolic volume 83 ml  IMPRESSION: 1. Moderate degree of myocardial scar in the inferolateral, mid inferoseptal, and inferoapical walls. No ischemic zones seen.  2. Inferior, inferoseptal, and inferoapical hypokinesis.  3. Left ventricular ejection fraction 27%  4. High-risk stress test findings* based upon reduced LV systolic function. However, given arrhythmia, would recommend echocardiography for a more accurate  assessment of LV systolic function and regional wall motion if indicated.  *2012 Appropriate Use Criteria for Coronary Revascularization Focused Update: J Am Coll Cardiol. 8828;00(3):491-791. http://content.[email or web address].aspx?articleid=[email or web address]   Electronically Signed   By: Kate Sable   On: 07/30/2014 15:18    telemetry sinus rhythm-bradycardia  EKG: SR 55  [id number]*   carotid massage was negative   Assessment and Plan:  Presyncope  Cardiomyopathy with a presumed ischemic/scar component ejection fraction 25-35% with perfusion defect evidenced by Myoview scanning  Atrial fibrillation/nonsustained ventricular tachycardia by report  Sinus bradycardia  Prior stroke  The patient has nonsustained ventricular tachycardia and episodes of presyncope. These occur in the context of a cardiomyopathy which apparently has a scar component by Myoview scanning. This suggests a possible substrate of reentry which is elucidatable potentially by electro physiological testing.  In the event that he were inducible would undertake dual-chamber device implantation given his bradycardia and is known tachyarrhythmias. In the event that he were noninducible, I would anticipate the implantation of a loop recorder.  Given his bradycardia and the introduction of amiodarone, we will stop his carvedilol.  I reviewed the above with him extensively and he is agreeable to that plan   Virl Axe

## 2014-08-06 LAB — CBC
HEMATOCRIT: 40.3 % (ref 39.0–52.0)
Hemoglobin: 13.4 g/dL (ref 13.0–17.0)
MCH: 31.1 pg (ref 26.0–34.0)
MCHC: 33.3 g/dL (ref 30.0–36.0)
MCV: 93.5 fL (ref 78.0–100.0)
PLATELETS: 181 10*3/uL (ref 150–400)
RBC: 4.31 MIL/uL (ref 4.22–5.81)
RDW: 14.1 % (ref 11.5–15.5)
WBC: 7.8 10*3/uL (ref 4.0–10.5)

## 2014-08-06 MED ORDER — ATORVASTATIN CALCIUM 20 MG PO TABS: 20.0000 mg | ORAL_TABLET | Freq: Every day | ORAL | Status: DC

## 2014-08-06 MED ORDER — APIXABAN 5 MG PO TABS: 5.0000 mg | ORAL_TABLET | Freq: Two times a day (BID) | ORAL | Status: DC

## 2014-08-06 MED ORDER — SPIRONOLACTONE 25 MG PO TABS: 12.5000 mg | ORAL_TABLET | Freq: Every day | ORAL | Status: DC

## 2014-08-06 MED ORDER — LISINOPRIL 2.5 MG PO TABS: 2.5000 mg | ORAL_TABLET | Freq: Every day | ORAL | Status: DC

## 2014-08-06 MED ORDER — AMIODARONE HCL 200 MG PO TABS: 200.0000 mg | ORAL_TABLET | Freq: Two times a day (BID) | ORAL | Status: DC

## 2014-08-06 NOTE — Discharge Instructions (Signed)
No driving for 6 months. No lifting over 5 lbs for 1 week. No sexual activity for 1 week. Keep procedure site clean & dry. If you notice increased pain, swelling, bleeding or pus, call/return!  You may shower, but no soaking baths/hot tubs/pools for 1 week.     Amiodarone tablets What is this medicine? AMIODARONE (a MEE oh da rone) is an antiarrhythmic drug. It helps make your heart beat regularly. Because of the side effects caused by this medicine, it is only used when other medicines have not worked. It is usually used for heartbeat problems that may be life threatening. This medicine may be used for other purposes; ask your health care provider or pharmacist if you have questions. COMMON BRAND NAME(S): Cordarone, Pacerone What should I tell my health care provider before I take this medicine? They need to know if you have any of these conditions: -liver disease -lung disease -other heart problems -thyroid disease -an unusual or allergic reaction to amiodarone, iodine, other medicines, foods, dyes, or preservatives -pregnant or trying to get pregnant -breast-feeding How should I use this medicine? Take this medicine by mouth with a glass of water. Follow the directions on the prescription label. You can take this medicine with or without food. However, you should always take it the same way each time. Take your doses at regular intervals. Do not take your medicine more often than directed. Do not stop taking except on the advice of your doctor or health care professional. A special MedGuide will be given to you by the pharmacist with each prescription and refill. Be sure to read this information carefully each time. Talk to your pediatrician regarding the use of this medicine in children. Special care may be needed. Overdosage: If you think you have taken too much of this medicine contact a poison control center or emergency room at once. NOTE: This medicine is only for you. Do not share  this medicine with others. What if I miss a dose? If you miss a dose, take it as soon as you can. If it is almost time for your next dose, take only that dose. Do not take double or extra doses. What may interact with this medicine? Do not take this medicine with any of the following medications: -abarelix -apomorphine -arsenic trioxide -certain antibiotics like erythromycin, gemifloxacin, levofloxacin, pentamidine -certain medicines for depression like amoxapine, tricyclic antidepressants -certain medicines for fungal infections like fluconazole, itraconazole, ketoconazole, posaconazole, voriconazole -certain medicines for irregular heart beat like disopyramide, dofetilide, dronedarone, ibutilide, propafenone, sotalol -certain medicines for malaria like chloroquine, halofantrine -cisapride -droperidol -haloperidol -hawthorn -maprotiline -methadone -phenothiazines like chlorpromazine, mesoridazine, thioridazine -pimozide -ranolazine -red yeast rice -vardenafil -ziprasidone This medicine may also interact with the following medications: -antiviral medicines for HIV or AIDS -certain medicines for blood pressure, heart disease, irregular heart beat -certain medicines for cholesterol like atorvastatin, cerivastatin, lovastatin, simvastatin -certain medicines for hepatitis C like sofosbuvir and ledipasvir; sofosbuvir -certain medicines for seizures like phenytoin -certain medicines for thyroid problems -certain medicines that treat or prevent blood clots like warfarin -cholestyramine -cimetidine -clopidogrel -cyclosporine -dextromethorphan -diuretics -fentanyl -general anesthetics -grapefruit juice -lidocaine -loratadine -methotrexate -other medicines that prolong the QT interval (cause an abnormal heart rhythm) -procainamide -quinidine -rifabutin, rifampin, or rifapentine -St. John's Wort -trazodone This list may not describe all possible interactions. Give your health  care provider a list of all the medicines, herbs, non-prescription drugs, or dietary supplements you use. Also tell them if you smoke, drink alcohol, or use illegal drugs. Some  items may interact with your medicine. What should I watch for while using this medicine? Your condition will be monitored closely when you first begin therapy. Often, this drug is first started in a hospital or other monitored health care setting. Once you are on maintenance therapy, visit your doctor or health care professional for regular checks on your progress. Because your condition and use of this medicine carry some risk, it is a good idea to carry an identification card, necklace or bracelet with details of your condition, medications, and doctor or health care professional. Dennis Bast may get drowsy or dizzy. Do not drive, use machinery, or do anything that needs mental alertness until you know how this medicine affects you. Do not stand or sit up quickly, especially if you are an older patient. This reduces the risk of dizzy or fainting spells. This medicine can make you more sensitive to the sun. Keep out of the sun. If you cannot avoid being in the sun, wear protective clothing and use sunscreen. Do not use sun lamps or tanning beds/booths. You should have regular eye exams before and during treatment. Call your doctor if you have blurred vision, see halos, or your eyes become sensitive to light. Your eyes may get dry. It may be helpful to use a lubricating eye solution or artificial tears solution. If you are going to have surgery or a procedure that requires contrast dyes, tell your doctor or health care professional that you are taking this medicine. What side effects may I notice from receiving this medicine? Side effects that you should report to your doctor or health care professional as soon as possible: -allergic reactions like skin rash, itching or hives, swelling of the face, lips, or tongue -blue-gray coloring of  the skin -blurred vision, seeing blue green halos, increased sensitivity of the eyes to light -breathing problems -chest pain -dark urine -fast, irregular heartbeat -feeling faint or light-headed -intolerance to heat or cold -nausea or vomiting -pain and swelling of the scrotum -pain, tingling, numbness in feet, hands -redness, blistering, peeling or loosening of the skin, including inside the mouth -spitting up blood -stomach pain -sweating -unusual or uncontrolled movements of body -unusually weak or tired -weight gain or loss -yellowing of the eyes or skin Side effects that usually do not require medical attention (report to your doctor or health care professional if they continue or are bothersome): -change in sex drive or performance -constipation -dizziness -headache -loss of appetite -trouble sleeping This list may not describe all possible side effects. Call your doctor for medical advice about side effects. You may report side effects to FDA at 1-800-FDA-1088. Where should I keep my medicine? Keep out of the reach of children. Store at room temperature between 20 and 25 degrees C (68 and 77 degrees F). Protect from light. Keep container tightly closed. Throw away any unused medicine after the expiration date. NOTE: This sheet is a summary. It may not cover all possible information. If you have questions about this medicine, talk to your doctor, pharmacist, or health care provider.  2015, Elsevier/Gold Standard. (2013-08-20 19:48:11) Atrial Fibrillation Atrial fibrillation is a type of irregular heart rhythm (arrhythmia). During atrial fibrillation, the upper chambers of the heart (atria) quiver continuously in a chaotic pattern. This causes an irregular and often rapid heart rate.  Atrial fibrillation is the result of the heart becoming overloaded with disorganized signals that tell it to beat. These signals are normally released one at a time by a part of the  right atrium  called the sinoatrial node. They then travel from the atria to the lower chambers of the heart (ventricles), causing the atria and ventricles to contract and pump blood as they pass. In atrial fibrillation, parts of the atria outside of the sinoatrial node also release these signals. This results in two problems. First, the atria receive so many signals that they do not have time to fully contract. Second, the ventricles, which can only receive one signal at a time, beat irregularly and out of rhythm with the atria.  There are three types of atrial fibrillation:   Paroxysmal. Paroxysmal atrial fibrillation starts suddenly and stops on its own within a week.  Persistent. Persistent atrial fibrillation lasts for more than a week. It may stop on its own or with treatment.  Permanent. Permanent atrial fibrillation does not go away. Episodes of atrial fibrillation may lead to permanent atrial fibrillation. Atrial fibrillation can prevent your heart from pumping blood normally. It increases your risk of stroke and can lead to heart failure.  CAUSES   Heart conditions, including a heart attack, heart failure, coronary artery disease, and heart valve conditions.   Inflammation of the sac that surrounds the heart (pericarditis).  Blockage of an artery in the lungs (pulmonary embolism).  Pneumonia or other infections.  Chronic lung disease.  Thyroid problems, especially if the thyroid is overactive (hyperthyroidism).  Caffeine, excessive alcohol use, and use of some illegal drugs.   Use of some medicines, including certain decongestants and diet pills.  Heart surgery.   Birth defects.  Sometimes, no cause can be found. When this happens, the atrial fibrillation is called lone atrial fibrillation. The risk of complications from atrial fibrillation increases if you have lone atrial fibrillation and you are age 37 years or older. RISK FACTORS  Heart failure.  Coronary artery  disease.  Diabetes mellitus.   High blood pressure (hypertension).   Obesity.   Other arrhythmias.   Increased age. SIGNS AND SYMPTOMS   A feeling that your heart is beating rapidly or irregularly.   A feeling of discomfort or pain in your chest.   Shortness of breath.   Sudden light-headedness or weakness.   Getting tired easily when exercising.   Urinating more often than normal (mainly when atrial fibrillation first begins).  In paroxysmal atrial fibrillation, symptoms may start and suddenly stop. DIAGNOSIS  Your health care provider may be able to detect atrial fibrillation when taking your pulse. Your health care provider may have you take a test called an ambulatory electrocardiogram (ECG). An ECG records your heartbeat patterns over a 24-hour period. You may also have other tests, such as:  Transthoracic echocardiogram (TTE). During echocardiography, sound waves are used to evaluate how blood flows through your heart.  Transesophageal echocardiogram (TEE).  Stress test. There is more than one type of stress test. If a stress test is needed, ask your health care provider about which type is best for you.  Chest X-ray exam.  Blood tests.  Computed tomography (CT). TREATMENT  Treatment may include:  Treating any underlying conditions. For example, if you have an overactive thyroid, treating the condition may correct atrial fibrillation.  Taking medicine. Medicines may be given to control a rapid heart rate or to prevent blood clots, heart failure, or a stroke.  Having a procedure to correct the rhythm of the heart:  Electrical cardioversion. During electrical cardioversion, a controlled, low-energy shock is delivered to the heart through your skin. If you have chest pain, very  low blood pressure, or sudden heart failure, this procedure may need to be done as an emergency.  Catheter ablation. During this procedure, heart tissues that send the signals  that cause atrial fibrillation are destroyed.  Surgical ablation. During this surgery, thin lines of heart tissue that carry the abnormal signals are destroyed. This procedure can either be an open-heart surgery or a minimally invasive surgery. With the minimally invasive surgery, small cuts are made to access the heart instead of a large opening.  Pulmonary venous isolation. During this surgery, tissue around the veins that carry blood from the lungs (pulmonary veins) is destroyed. This tissue is thought to carry the abnormal signals. HOME CARE INSTRUCTIONS   Take medicines only as directed by your health care provider. Some medicines can make atrial fibrillation worse or recur.  If blood thinners were prescribed by your health care provider, take them exactly as directed. Too much blood-thinning medicine can cause bleeding. If you take too little, you will not have the needed protection against stroke and other problems.  Perform blood tests at home if directed by your health care provider. Perform blood tests exactly as directed.  Quit smoking if you smoke.  Do not drink alcohol.  Do not drink caffeinated beverages such as coffee, soda, and some teas. You may drink decaffeinated coffee, soda, or tea.   Maintain a healthy weight.Do not use diet pills unless your health care provider approves. They may make heart problems worse.   Follow diet instructions as directed by your health care provider.  Exercise regularly as directed by your health care provider.  Keep all follow-up visits as directed by your health care provider. This is important. PREVENTION  The following substances can cause atrial fibrillation to recur:   Caffeinated beverages.  Alcohol.  Certain medicines, especially those used for breathing problems.  Certain herbs and herbal medicines, such as those containing ephedra or ginseng.  Illegal drugs, such as cocaine and amphetamines. Sometimes medicines are  given to prevent atrial fibrillation from recurring. Proper treatment of any underlying condition is also important in helping prevent recurrence.  SEEK MEDICAL CARE IF:  You notice a change in the rate, rhythm, or strength of your heartbeat.  You suddenly begin urinating more frequently.  You tire more easily when exerting yourself or exercising. SEEK IMMEDIATE MEDICAL CARE IF:   You have chest pain, abdominal pain, sweating, or weakness.  You feel nauseous.  You have shortness of breath.  You suddenly have swollen feet and ankles.  You feel dizzy.  Your face or limbs feel numb or weak.  You have a change in your vision or speech. MAKE SURE YOU:   Understand these instructions.  Will watch your condition.  Will get help right away if you are not doing well or get worse. Document Released: 05/17/2005 Document Revised: 10/01/2013 Document Reviewed: 06/27/2012 [facility] Patient Information 2015 [place], [place]. This information is not intended to replace advice given to you by your health care provider. Make sure you discuss any questions you have with your health care provider.  Information on my medicine - ELIQUIS (apixaban)  This medication education was reviewed with me or my healthcare representative as part of my discharge preparation.  The pharmacist that spoke with me during my hospital stay was:  Adora Fridge, [facility]  Why was Eliquis prescribed for you? Eliquis was prescribed for you to reduce the risk of a blood clot forming that can cause a stroke if you have a medical condition  called atrial fibrillation (a type of irregular heartbeat).  What do You need to know about Eliquis ? Take your Eliquis TWICE DAILY - one tablet in the morning and one tablet in the evening with or without food. If you have difficulty swallowing the tablet whole please discuss with your pharmacist how to take the medication safely.  Take Eliquis exactly as prescribed by your doctor  and DO NOT stop taking Eliquis without talking to the doctor who prescribed the medication.  Stopping may increase your risk of developing a stroke.  Refill your prescription before you run out.  After discharge, you should have regular check-up appointments with your healthcare provider that is prescribing your Eliquis.  In the future your dose may need to be changed if your kidney function or weight changes by a significant amount or as you get older.  What do you do if you miss a dose? If you miss a dose, take it as soon as you remember on the same day and resume taking twice daily.  Do not take more than one dose of ELIQUIS at the same time to make up a missed dose.  Important Safety Information A possible side effect of Eliquis is bleeding. You should call your healthcare provider right away if you experience any of the following: ? Bleeding from an injury or your nose that does not stop. ? Unusual colored urine (red or dark brown) or unusual colored stools (red or black). ? Unusual bruising for unknown reasons. ? A serious fall or if you hit your head (even if there is no bleeding).  Some medicines may interact with Eliquis and might increase your risk of bleeding or clotting while on Eliquis. To help avoid this, consult your healthcare provider or pharmacist prior to using any new prescription or non-prescription medications, including herbals, vitamins, non-steroidal anti-inflammatory drugs (NSAIDs) and supplements.  This website has more information on Eliquis (apixaban): http://www.eliquis.com/eliquis/home

## 2014-08-06 NOTE — Op Note (Signed)
NAMEDORSEY, AUTHEMENT NO.:  [id number]  MEDICAL RECORD NO.:  [id number]  LOCATION:  2W29C                        FACILITY:  [place]  PHYSICIAN:  Deboraha Sprang, MD, FACCDATE OF BIRTH:  January 14, 1933  DATE OF PROCEDURE:  08/05/2014 DATE OF DISCHARGE:                              OPERATIVE REPORT   PREOPERATIVE DIAGNOSES:  Nonsustained ventricular tachycardia, presyncope, history of atrial fibrillation, ischemic cardiomyopathy.  POSTOPERATIVE DIAGNOSES:  Nonsustained ventricular tachycardia, presyncope, history of atrial fibrillation, ischemic cardiomyopathy.  PROCEDURE:  Invasive electrophysiological study with arrhythmia induction.  DESCRIPTION OF PROCEDURE:  Following obtaining informed consent, the patient was brought to Electrophysiology Laboratory and placed on the fluoroscopic table in a supine position.  After routine prep and drape, cardiac catheterization was performed with local anesthesia and conscious sedation.  Noninvasive blood pressure monitoring, transcutaneous oxygen saturation monitoring were performed continuously throughout the procedure.  Following the procedure, the catheters were removed.  The hemostasis was obtained.  The patient was then prepared for loop recorder implantation in stable condition.  CATHETERS:  A 5-French quadripolar catheter was inserted via the left femoral vein to the AV junction.  A 5-French quadripolar catheter was inserted via the right femoral vein to the right ventricular apex, moved to the right ventricular outflow tract and into the high right atrium.  Surface leads 1, AVF and V1 were monitored continuously throughout the procedure.  Following insertion of the catheters, the stimulation protocol included incremental atrial pacing. Incremental ventricular pacing. Single and double ventricular extrastimuli at paced cycle length of 350/500 milliseconds from the right ventricular apex. Double and triple  ventricular stimuli from right ventricular apex and right ventricular outflow tract at a paced cycle length of 500 and 350 milliseconds.  END-TIDAL RESULTS:  End-tidal Surface Electrocardiogram:  The initial rhythm was sinus. Cycle length 927 milliseconds; PR interval 197 milliseconds; P-wave duration 148 milliseconds; QRS duration 120 milliseconds; QTc interval 433 milliseconds. AV nodal Wenckebach was at 450 milliseconds. VA conduction was dissociated at 600 milliseconds.  ACCESSORY PATHWAY:  No evidence of accessory pathway was identified.  VENTRICULAR RESPONSE PROGRAMMED STIMULATION:  Effective refractory period of the right ventricular apex at a paced cycle length of 500 milliseconds was 270 milliseconds and 350 milliseconds was 240 milliseconds.  The effective refractory of the right ventricular outflow tract at a paced cycle length of 500 milliseconds was 260 milliseconds and 350 milliseconds was 240 milliseconds.  Closest coupling interval from the right ventricular apex at 350 milliseconds was 260:200:200, and from the outflow tract was 350:240:200:200.  Arrhythmias induced; nonsustained ventricular arrhythmias of less than 6 seconds were induced.  Repeated couples produced, no sustained arrhythmias were identified.  IMPRESSION: 1. Normal sinus function. 2. Normal atrioventricular nodal function. 3. Normal His-Purkinje system function. 4. No accessory pathway. 5. No inducible sustained ventricular arrhythmias.  SUMMARY AND CONCLUSION:  The results of electrophysiological testing failed to identify a substrate for inducible sustained ventricular arrhythmias, not listed in the patient's nonsustained ventricular tachycardia previously identified, cardiomyopathy and scar.  Given his recurrent symptoms of presyncope, a loop recorder will be inserted.     Deboraha Sprang, MD, [facility]     SCK/MEDQ  D:  08/05/2014  T:  08/06/2014  Job:  [id number]

## 2014-08-06 NOTE — Progress Notes (Signed)
CARE MANAGEMENT NOTE 08/06/2014  Patient:  Alejandro [name], Alejandro [name]   Account Number:  [id number]  Date Initiated:  08/06/2014  Documentation initiated by:  [facility]  Subjective/Objective Assessment:   nonischemic cardiomyopathy with recent presyncope 2/16, loop recorder 3/7     Action/Plan:   Anticipated DC Date:  08/06/2014   Anticipated DC Plan:  [place]  CM consult      Choice offered to / List presented to:             Status of service:  Completed, signed off Medicare Important Message given?  YES (If response is "NO", the following Medicare IM given date fields will be blank) Date Medicare IM given:  08/06/2014 Medicare IM given by:  [facility] Date Additional Medicare IM given:   Additional Medicare IM given by:    Discharge Disposition:  HOME/SELF CARE  Per UR Regulation:    If discussed at Long Length of Stay Meetings, dates discussed:    Comments:  08/06/2014 1020 NCM spoke to pt. Lives at home with wife. Has Eliquis 30 day free trial card. Instructed pt to take card with him to the pharmacy when he picks up his Rx. Jonnie Finner RN CCM Case Mgmt phone [phone]

## 2014-08-06 NOTE — Progress Notes (Signed)
Patient Name: Alejandro [name]      SUBJECTIVE without complaint  Past Medical History  Diagnosis Date  . Essential hypertension   . Secondary cardiomyopathy     LVEF 40-45% January 2014    Scheduled Meds:  Scheduled Meds: . amiodarone  200 mg Oral BID  . atorvastatin  20 mg Oral q1800  . lisinopril  2.5 mg Oral Daily  . sodium chloride  3 mL Intravenous Q12H  . sodium chloride  3 mL Intravenous Q12H  . spironolactone  12.5 mg Oral Daily   Continuous Infusions:  sodium chloride, sodium chloride, [DISCONTINUED] acetaminophen **OR** acetaminophen, acetaminophen, ondansetron **OR** ondansetron (ZOFRAN) IV, oxyCODONE, senna-docusate, sodium chloride, sodium chloride    PHYSICAL EXAM Filed Vitals:   08/05/14 0351 08/05/14 1306 08/05/14 1650 08/06/14 0414  BP: 115/72 141/73 140/70 90/62  Pulse: 55 69 56 62  Temp: 98 F (36.7 C) 98.3 F (36.8 C) 97.9 F (36.6 C) 98.7 F (37.1 C)  TempSrc: Oral Oral Oral Oral  Resp: 18 18 18 19   Height:      Weight:      SpO2: 97% 96% 97% 97%   Well developed and nourished in no acute distress HENT normal Neck supple with JVP-flat Clear Regular rate and rhythm, no murmurs or gallops Abd-soft with active BS No Clubbing cyanosis edema Skin-warm and dry A & Oriented  Grossly normal sensory and motor function  TELEMETRY: Reviewed telemetry pt inNSR:    Intake/Output Summary (Last 24 hours) at 08/06/14 0831 Last data filed at 08/05/14 1630  Gross per 24 hour  Intake    240 ml  Output      0 ml  Net    240 ml    LABS: Basic Metabolic Panel:  Recent Labs Lab 07/30/14 1440 07/31/14 0117 08/02/14 0255 08/04/14 0441 08/05/14 0454  NA 137 138 138 136 137  K 4.2 3.9 3.7 3.7 4.1  CL 106 106 105 107 108  CO2 29 28 25 24 25   GLUCOSE 125* 108* 95 102* 97  BUN 22 22 15 17 18   CREATININE 1.23 1.23 1.29 1.22 1.29  CALCIUM 9.1 8.7 8.8 8.8 8.8  MG 1.9  --   --   --   --    Cardiac Enzymes: No results for input(s):  CKTOTAL, CKMB, CKMBINDEX, TROPONINI in the last 72 hours. CBC:  Recent Labs Lab 07/30/14 1440 07/31/14 0117 08/01/14 0237 08/02/14 0255 08/03/14 0304 08/04/14 0441 08/05/14 0454 08/06/14 0521  WBC 9.3 7.6 6.2 5.9 7.0 6.9 7.4 7.8  NEUTROABS 5.5  --   --   --   --   --   --   --   HGB 14.8 13.3 13.2 12.9* 13.1 12.9* 13.4 13.4  HCT 44.6 40.0 39.6 38.6* 38.5* 38.8* 40.4 40.3  MCV 95.9 95.5 93.0 93.2 93.7 92.8 93.7 93.5  PLT 232 206 195 169 171 180 183 181   PROTIME: No results for input(s): LABPROT, INR in the last 72 hours. Liver Function Tests: No results for input(s): AST, ALT, ALKPHOS, BILITOT, PROT, ALBUMIN in the last 72 hours. No results for input(s): LIPASE, AMYLASE in the last 72 hours. BNP: BNP (last 3 results)  Recent Labs  08/02/14 0255  BNP 285.1*    ProBNP (last 3 results) No results for input(s): PROBNP in the last 8760 hours.     ASSESSMENT AND PLAN:  Active Problems:   Atrial fibrillation with RVR   NSVT (nonsustained ventricular tachycardia)  Congestive dilated cardiomyopathy   Syncope   Ok to discharge F/u with [facility] in 4-6 weeks and [facility] 3 monthxs Will work on getting strips from [employer], Virl Axe MD  08/06/2014

## 2014-08-06 NOTE — Discharge Summary (Signed)
ELECTROPHYSIOLOGY PROCEDURE DISCHARGE SUMMARY    Patient ID: Alejandro [name],  MRN: [id number], DOB/AGE: 79-Nov-1934 79 y.o.  Admit date: 07/30/2014 Discharge date: 08/06/2014  Primary Care Physician: Glenda Chroman., MD Primary Cardiologist: Bronson Ing Electrophysiologist: seen by Caryl Comes this admission, to establish with [facility] in [facility]  Primary Discharge Diagnosis:  1.  Syncope s/p EPS/ILR implantation this admission  Secondary Discharge Diagnosis:  1.  Hypertension 2.  Atrial fibrillation 3.  CAD - cath this admission with aneurysmal RCA with nonobstructive suspected chronic thrombus distally and 60-70% ostial LCx stenosis 4.  Non ischemic cardiomyopathy 5.  NSVT   No Known Allergies   Procedures This Admission: 1.  Cardiac catheterization on 07-31-14 by Dr Fletcher Anon.  This demonstrated aneurysmal right coronary artery with sluggish flow and possible chronic layered thrombus distally. Borderline significant ostial left circumflex stenosis. Otherwise no evidence of obstructive coronary artery disease; moderately to severely reduced LV systolic function with an ejection fraction of 30-35%. Normal left ventricular end-diastolic pressure 2.  Electrophysiology study on 08-05-14 by Dr Caryl Comes.  This demonstrated no inducible sustained ventricular arrhythmias.  There were no early apparent complications.  3.  Implantable loop recorder on 08-05-14 by Dr Caryl Comes.  The patient received a MDT LINQ implantable loop recorder.   Brief HPI/Hospital Course:  Alejandro [name] is a 81 y.o. male with a past medical history as outlined above. He has had recurrent syncope and presented to [facility] for further evaluation.  A myoview was undertaken during which he developed AF with NSVT for which he was asymptomatic.  He was started on amiodarone and transferred to [facility] for further evaluation.  Cardiac catheterization demonstrated an EF of 30% with aneurysmal RCA and moderate diffuse obstruction but no high grade  lesions. EP was consulted who recommended EP study with implantable loop recorder if negative.  He underwent EPS with no substrate for VT identified.  Loop recorder was implanted.  Due to bradycardia, his Coreg was discontinued.  He was started on Eliquis, Lisinopril, and Spironolactone this admission.  He will follow up with Dr Domenic Polite in 2 weeks and with Dr Rayann Heman in 3 months in the [place] office.  Dr Caryl Comes examined the patient on the day of discharge and considered him stable for discharge to home.   He is aware that he should not drive for 6 months following syncopal episode.   Physical Exam: Filed Vitals:   08/05/14 1306 08/05/14 1650 08/06/14 0414 08/06/14 0956  BP: 141/73 140/70 90/62 113/75  Pulse: 69 56 62   Temp: 98.3 F (36.8 C) 97.9 F (36.6 C) 98.7 F (37.1 C)   TempSrc: Oral Oral Oral   Resp: 18 18 19    Height:      Weight:      SpO2: 96% 97% 97%     Labs:   Lab Results  Component Value Date   WBC 7.8 08/06/2014   HGB 13.4 08/06/2014   HCT 40.3 08/06/2014   MCV 93.5 08/06/2014   PLT 181 08/06/2014    Recent Labs Lab 08/02/14 0255  08/05/14 0454  NA 138  < > 137  K 3.7  < > 4.1  CL 105  < > 108  CO2 25  < > 25  BUN 15  < > 18  CREATININE 1.29  < > 1.29  CALCIUM 8.8  < > 8.8  PROT 5.9*  --   --   BILITOT 1.1  --   --   ALKPHOS 37*  --   --  ALT 11  --   --   AST 17  --   --   GLUCOSE 95  < > 97  < > = values in this interval not displayed.   Discharge Medications:    Medication List    STOP taking these medications        aspirin EC 81 MG tablet      TAKE these medications        amiodarone 200 MG tablet  Commonly known as:  PACERONE  Take 1 tablet (200 mg total) by mouth 2 (two) times daily.     apixaban 5 MG Tabs tablet  Commonly known as:  ELIQUIS  Take 1 tablet (5 mg total) by mouth 2 (two) times daily.     atorvastatin 20 MG tablet  Commonly known as:  LIPITOR  Take 1 tablet (20 mg total) by mouth daily at 6 PM.      lisinopril 2.5 MG tablet  Commonly known as:  PRINIVIL,ZESTRIL  Take 1 tablet (2.5 mg total) by mouth daily.     spironolactone 25 MG tablet  Commonly known as:  ALDACTONE  Take 0.5 tablets (12.5 mg total) by mouth daily.        Disposition:   Follow-up Information    Follow up with Rozann Lesches, MD On 08/14/2014.   Specialty:  Cardiology   Why:  at 11AM   Contact information:   [place] [address] [phone]       Follow up with [facility] On 08/16/2014.   Why:  at [facility] for wound check   Contact information:   [address] [place] [address]       Follow up with Thompson Grayer, MD On 11/06/2014.   Specialty:  Cardiology   Why:  at [facility] information:   [place] [address] [phone]       Duration of Discharge Encounter: Greater than 30 minutes including physician time.  Signed, Chanetta Marshall, NP 08/06/2014 12:43 PM

## 2014-08-12 ENCOUNTER — Emergency Department (HOSPITAL_COMMUNITY): Payer: Medicare Other

## 2014-08-12 ENCOUNTER — Encounter (HOSPITAL_COMMUNITY): Payer: Self-pay | Admitting: Emergency Medicine

## 2014-08-12 ENCOUNTER — Observation Stay (HOSPITAL_COMMUNITY)
Admission: EM | Admit: 2014-08-12 | Discharge: 2014-08-13 | Disposition: A | Discharge status: Home / Self Care | Payer: Medicare Other | Attending: Internal Medicine | Admitting: Internal Medicine

## 2014-08-12 DIAGNOSIS — R079 Chest pain, unspecified: Secondary | ICD-10-CM | POA: Diagnosis present

## 2014-08-12 DIAGNOSIS — R42 Dizziness and giddiness: Secondary | ICD-10-CM | POA: Diagnosis present

## 2014-08-12 DIAGNOSIS — I10 Essential (primary) hypertension: Secondary | ICD-10-CM | POA: Insufficient documentation

## 2014-08-12 DIAGNOSIS — Z9889 Other specified postprocedural states: Secondary | ICD-10-CM | POA: Diagnosis not present

## 2014-08-12 DIAGNOSIS — I429 Cardiomyopathy, unspecified: Secondary | ICD-10-CM | POA: Insufficient documentation

## 2014-08-12 DIAGNOSIS — H539 Unspecified visual disturbance: Secondary | ICD-10-CM | POA: Diagnosis not present

## 2014-08-12 DIAGNOSIS — R0789 Other chest pain: Secondary | ICD-10-CM | POA: Diagnosis not present

## 2014-08-12 DIAGNOSIS — I472 Ventricular tachycardia: Secondary | ICD-10-CM | POA: Diagnosis not present

## 2014-08-12 DIAGNOSIS — I4891 Unspecified atrial fibrillation: Secondary | ICD-10-CM | POA: Diagnosis not present

## 2014-08-12 DIAGNOSIS — I420 Dilated cardiomyopathy: Secondary | ICD-10-CM | POA: Insufficient documentation

## 2014-08-12 DIAGNOSIS — Z79899 Other long term (current) drug therapy: Secondary | ICD-10-CM | POA: Insufficient documentation

## 2014-08-12 HISTORY — DX: Syncope and collapse: R55

## 2014-08-12 HISTORY — DX: Ventricular tachycardia: I47.2

## 2014-08-12 HISTORY — DX: Other ventricular tachycardia: I47.29

## 2014-08-12 LAB — CBC WITH DIFFERENTIAL/PLATELET
Basophils Absolute: 0.1 10*3/uL (ref 0.0–0.1)
Basophils Relative: 1 % (ref 0–1)
Eosinophils Absolute: 0.7 10*3/uL (ref 0.0–0.7)
Eosinophils Relative: 8 % — ABNORMAL HIGH (ref 0–5)
HCT: 39.8 % (ref 39.0–52.0)
HEMOGLOBIN: 13.1 g/dL (ref 13.0–17.0)
Lymphocytes Relative: 31 % (ref 12–46)
Lymphs Abs: 2.5 10*3/uL (ref 0.7–4.0)
MCH: 31 pg (ref 26.0–34.0)
MCHC: 32.9 g/dL (ref 30.0–36.0)
MCV: 94.3 fL (ref 78.0–100.0)
MONOS PCT: 11 % (ref 3–12)
Monocytes Absolute: 0.9 10*3/uL (ref 0.1–1.0)
NEUTROS PCT: 49 % (ref 43–77)
Neutro Abs: 4 10*3/uL (ref 1.7–7.7)
Platelets: 208 10*3/uL (ref 150–400)
RBC: 4.22 MIL/uL (ref 4.22–5.81)
RDW: 14.2 % (ref 11.5–15.5)
WBC: 8.1 10*3/uL (ref 4.0–10.5)

## 2014-08-12 LAB — URINALYSIS, ROUTINE W REFLEX MICROSCOPIC
BILIRUBIN URINE: NEGATIVE
Glucose, UA: NEGATIVE mg/dL
KETONES UR: NEGATIVE mg/dL
NITRITE: NEGATIVE
Protein, ur: NEGATIVE mg/dL
Specific Gravity, Urine: <1.005 — ABNORMAL LOW (ref 1.005–1.030)
UROBILINOGEN UA: 0.2 mg/dL (ref 0.0–1.0)
pH: 6.5 (ref 5.0–8.0)

## 2014-08-12 LAB — URINE MICROSCOPIC-ADD ON

## 2014-08-12 NOTE — ED Notes (Signed)
Patient ambulated from bathroom to room 7 without difficulty.

## 2014-08-12 NOTE — ED Notes (Signed)
Patient was recently discharged form [place]. Admitted to ICU for Afib with RVR and SVT, also states had a catheterization. Patient states tonight he started having dizziness and his vision went blurry.

## 2014-08-12 NOTE — ED Provider Notes (Signed)
CSN: [id number]     Arrival date & time 08/12/14  2244 History  This chart was scribed for Julianne Rice, MD by Tula Nakayama, ED Scribe. This patient was seen in room APA07/APA07 and the patient's care was started at 11:12 PM.    Chief Complaint  Patient presents with  . Dizziness   Patient is a 81 y.o. male presenting with dizziness. The history is provided by the patient. No language interpreter was used.  Dizziness Quality:  Lightheadedness and head spinning Severity:  Moderate Onset quality:  Sudden Timing:  Constant Progression:  Resolved Chronicity:  New Relieved by:  Being still Worsened by:  Nothing Ineffective treatments:  None tried Associated symptoms: chest pain   Associated symptoms: no headaches, no nausea, no palpitations, no shortness of breath, no vomiting and no weakness     HPI Comments: Alejandro [name] is a 81 y.o. male with a history of HTN, NSVT, A-fib and cardiomyopathy who presents to the Emergency Department after one episode of blurred vision and mild chest tightness that occurred 1.5 hours ago. He states tingling in his feet, light-headedness and dizziness as associated symptoms. The onset of symptoms started suddenly while the pt was at rest and lasted 5 minutes before relief. Pt was hospitalized 2 weeks ago after a stress test. He had a catheterization and a loop recorder placed. He denies current CP, blurred vision, nausea and SOB.    Cardiologist Jaynee Eagles and Domenic Polite  Past Medical History  Diagnosis Date  . Essential hypertension   . Secondary cardiomyopathy     LVEF 40-45% January 2014  . A-fib   . Congestive dilated cardiomyopathy   . NSVT (nonsustained ventricular tachycardia)   . Syncope    Past Surgical History  Procedure Laterality Date  . Laparoscopic appendectomy  November 2015    Dr. [name]  . Left heart catheterization with coronary angiogram N/A 07/31/2014    Procedure: LEFT HEART CATHETERIZATION WITH CORONARY ANGIOGRAM;   Surgeon: [name], MD;  Location: [facility] CATH LAB;  Service: Cardiovascular;  Laterality: N/A;  . Electrophysiology study N/A 08/05/2014    Procedure: ELECTROPHYSIOLOGY STUDY;  Surgeon: Deboraha Sprang, MD;  Location: [facility] CATH LAB;  Service: Cardiovascular;  Laterality: N/A;   Family History  Problem Relation Age of Onset  . Heart disease Father     Diagnosed in his 17s   History  Substance Use Topics  . Smoking status: Never Smoker   . Smokeless tobacco: Never Used  . Alcohol Use: No    Review of Systems  Constitutional: Negative for fever and chills.  Eyes: Positive for visual disturbance.  Respiratory: Negative for shortness of breath.   Cardiovascular: Positive for chest pain. Negative for palpitations and leg swelling.  Gastrointestinal: Negative for nausea, vomiting and abdominal pain.  Genitourinary: Negative for dysuria, frequency and flank pain.  Musculoskeletal: Negative for back pain, neck pain and neck stiffness.  Skin: Negative for rash and wound.  Neurological: Positive for dizziness and light-headedness. Negative for tremors, syncope, weakness, numbness and headaches.  All other systems reviewed and are negative.   Allergies  Review of patient's allergies indicates no known allergies.  Home Medications   Prior to Admission medications   Medication Sig Start Date End Date Taking? Authorizing Provider  amiodarone (PACERONE) 200 MG tablet Take 1 tablet (200 mg total) by mouth 2 (two) times daily. 08/06/14   Chanetta Marshall, NP  apixaban (ELIQUIS) 5 MG TABS tablet Take 1 tablet (5 mg total) by mouth  2 (two) times daily. 08/06/14   Chanetta Marshall, NP  atorvastatin (LIPITOR) 20 MG tablet Take 1 tablet (20 mg total) by mouth daily at 6 PM. 08/06/14   Chanetta Marshall, NP  lisinopril (PRINIVIL,ZESTRIL) 2.5 MG tablet Take 1 tablet (2.5 mg total) by mouth daily. 08/06/14   Chanetta Marshall, NP  spironolactone (ALDACTONE) 25 MG tablet Take 0.5 tablets (12.5 mg total) by mouth daily. 08/06/14    Chanetta Marshall, NP   BP 115/81 mmHg  Pulse 59  Temp(Src) 98 F (36.7 C) (Oral)  Resp 13  Ht 5\' 8"  (1.727 m)  Wt 176 lb (79.833 kg)  BMI 26.77 kg/m2  SpO2 97% Physical Exam  Constitutional: He is oriented to person, place, and time. He appears well-developed and well-nourished. No distress.  HENT:  Head: Normocephalic and atraumatic.  Mouth/Throat: Oropharynx is clear and moist.  Eyes: Conjunctivae and EOM are normal. Pupils are equal, round, and reactive to light.  No nystagmus  Neck: Normal range of motion. Neck supple. No tracheal deviation present.  Cardiovascular: Normal rate and regular rhythm.  Exam reveals no gallop and no friction rub.   No murmur heard. Pulmonary/Chest: Effort normal and breath sounds normal. No respiratory distress. He has no wheezes. He has no rales. He exhibits no tenderness.  Abdominal: Soft. Bowel sounds are normal. He exhibits no distension and no mass. There is no tenderness. There is no rebound and no guarding.  Musculoskeletal: Normal range of motion. He exhibits no edema or tenderness.  No calf swelling or tenderness  Neurological: He is alert and oriented to person, place, and time.  Patient is alert and oriented x3 with clear, goal oriented speech. Patient has 5/5 motor in all extremities. Sensation is intact to light touch. Bilateral finger-to-nose is normal with no signs of dysmetria.   Skin: Skin is warm and dry. No rash noted. No erythema.  Psychiatric: He has a normal mood and affect. His behavior is normal.  Nursing note and vitals reviewed.   ED Course  Procedures   DIAGNOSTIC STUDIES: Oxygen Saturation is 97% on RA, normal by my interpretation.    COORDINATION OF CARE: 11:18 PM Discussed treatment plan with pt at bedside and pt agreed to plan.   Labs Review Labs Reviewed  CBC WITH DIFFERENTIAL/PLATELET - Abnormal; Notable for the following:    Eosinophils Relative 8 (*)    All other components within normal limits   COMPREHENSIVE METABOLIC PANEL - Abnormal; Notable for the following:    Glucose, Bld 113 (*)    GFR calc non Af Amer 58 (*)    GFR calc Af Amer 67 (*)    All other components within normal limits  URINALYSIS, ROUTINE W REFLEX MICROSCOPIC - Abnormal; Notable for the following:    Specific Gravity, Urine <1.005 (*)    Hgb urine dipstick TRACE (*)    Leukocytes, UA MODERATE (*)    All other components within normal limits  URINE MICROSCOPIC-ADD ON - Abnormal; Notable for the following:    Bacteria, UA FEW (*)    All other components within normal limits  TROPONIN I    Imaging Review Dg Chest 2 View  08/13/2014   CLINICAL DATA:  Chest tightness and lightheadedness  EXAM: CHEST  2 VIEW  COMPARISON:  07/20/2014  FINDINGS: The heart size and mediastinal contours are within normal limits. Both lungs are clear. The visualized skeletal structures are unremarkable.  IMPRESSION: No active cardiopulmonary disease.   Electronically Signed   By: Andreas Newport  M.D.   On: 08/13/2014 00:00     EKG Interpretation   Date/Time:  Monday August 12 2014 23:10:35 EDT Ventricular Rate:  58 PR Interval:  254 QRS Duration: 115 QT Interval:  468 QTC Calculation: 460 R Axis:   -52 Text Interpretation:  Sinus rhythm Prolonged PR interval Incomplete left  bundle branch block Confirmed by Lita Mains  MD, Violeta Lecount ([id number]) on 08/13/2014  2:10:12 AM      MDM   Final diagnoses:  Chest tightness  Episodic lightheadedness   I personally performed the services described in this documentation, which was scribed in my presence. The recorded information has been reviewed and is accurate.  Patient remains asymptomatic in the emergency department. Discuss with Dr. Darrick Meigs. Will admit to telemetry observation bed.   Julianne Rice, MD 08/13/14 [phone]

## 2014-08-13 DIAGNOSIS — E876 Hypokalemia: Secondary | ICD-10-CM | POA: Diagnosis not present

## 2014-08-13 DIAGNOSIS — I10 Essential (primary) hypertension: Secondary | ICD-10-CM

## 2014-08-13 DIAGNOSIS — R079 Chest pain, unspecified: Secondary | ICD-10-CM

## 2014-08-13 DIAGNOSIS — R0789 Other chest pain: Secondary | ICD-10-CM | POA: Diagnosis present

## 2014-08-13 DIAGNOSIS — R42 Dizziness and giddiness: Secondary | ICD-10-CM

## 2014-08-13 LAB — COMPREHENSIVE METABOLIC PANEL
ALBUMIN: 3.4 g/dL — AB (ref 3.5–5.2)
ALBUMIN: 3.6 g/dL (ref 3.5–5.2)
ALK PHOS: 73 U/L (ref 39–117)
ALT: 12 U/L (ref 0–53)
ALT: 12 U/L (ref 0–53)
ANION GAP: 7 (ref 5–15)
AST: 16 U/L (ref 0–37)
AST: 19 U/L (ref 0–37)
Alkaline Phosphatase: 56 U/L (ref 39–117)
Anion gap: 7 (ref 5–15)
BILIRUBIN TOTAL: 0.5 mg/dL (ref 0.3–1.2)
BUN: 13 mg/dL (ref 6–23)
BUN: 13 mg/dL (ref 6–23)
CALCIUM: 8.5 mg/dL (ref 8.4–10.5)
CHLORIDE: 107 mmol/L (ref 96–112)
CHLORIDE: 106 mmol/L (ref 96–112)
CO2: 26 mmol/L (ref 19–32)
CO2: 27 mmol/L (ref 19–32)
Calcium: 8.7 mg/dL (ref 8.4–10.5)
Creatinine, Ser: 1.16 mg/dL (ref 0.50–1.35)
Creatinine, Ser: 1.18 mg/dL (ref 0.50–1.35)
GFR calc Af Amer: 66 mL/min — ABNORMAL LOW (ref >90)
GFR calc Af Amer: 67 mL/min — ABNORMAL LOW (ref >90)
GFR calc non Af Amer: 58 mL/min — ABNORMAL LOW (ref >90)
GFR, EST NON AFRICAN AMERICAN: 57 mL/min — AB (ref >90)
Glucose, Bld: 110 mg/dL — ABNORMAL HIGH (ref 70–99)
Glucose, Bld: 113 mg/dL — ABNORMAL HIGH (ref 70–99)
POTASSIUM: 3.2 mmol/L — AB (ref 3.5–5.1)
Potassium: 3.5 mmol/L (ref 3.5–5.1)
Sodium: 139 mmol/L (ref 135–145)
Sodium: 141 mmol/L (ref 135–145)
TOTAL PROTEIN: 6.3 g/dL (ref 6.0–8.3)
TOTAL PROTEIN: 6.7 g/dL (ref 6.0–8.3)
Total Bilirubin: 0.5 mg/dL (ref 0.3–1.2)

## 2014-08-13 LAB — TSH: TSH: 6.813 u[IU]/mL — ABNORMAL HIGH (ref 0.350–4.500)

## 2014-08-13 LAB — CBC
HCT: 37.7 % — ABNORMAL LOW (ref 39.0–52.0)
Hemoglobin: 12.6 g/dL — ABNORMAL LOW (ref 13.0–17.0)
MCH: 31.7 pg (ref 26.0–34.0)
MCHC: 33.4 g/dL (ref 30.0–36.0)
MCV: 95 fL (ref 78.0–100.0)
PLATELETS: 188 10*3/uL (ref 150–400)
RBC: 3.97 MIL/uL — AB (ref 4.22–5.81)
RDW: 14.1 % (ref 11.5–15.5)
WBC: 7.6 10*3/uL (ref 4.0–10.5)

## 2014-08-13 LAB — MAGNESIUM: MAGNESIUM: 1.8 mg/dL (ref 1.5–2.5)

## 2014-08-13 LAB — TROPONIN I
Troponin I: 0.03 ng/mL (ref <0.031)
Troponin I: 0.03 ng/mL (ref <0.031)
Troponin I: <0.03 ng/mL (ref <0.031)

## 2014-08-13 MED ORDER — LISINOPRIL 5 MG PO TABS
2.5000 mg | ORAL_TABLET | Freq: Every day | ORAL | Status: DC
  Administered 2014-08-13: 2.5 mg via ORAL
  Filled 2014-08-13: qty 1

## 2014-08-13 MED ORDER — ONDANSETRON HCL 4 MG/2ML IJ SOLN: 4.0000 mg | Freq: Four times a day (QID) | INTRAMUSCULAR | Status: DC | PRN

## 2014-08-13 MED ORDER — MORPHINE SULFATE 2 MG/ML IJ SOLN: 2.0000 mg | INTRAMUSCULAR | Status: DC | PRN

## 2014-08-13 MED ORDER — AMIODARONE HCL 200 MG PO TABS: 200.0000 mg | ORAL_TABLET | Freq: Every day | ORAL | Status: DC

## 2014-08-13 MED ORDER — AMIODARONE HCL 200 MG PO TABS
200.0000 mg | ORAL_TABLET | Freq: Two times a day (BID) | ORAL | Status: DC
  Administered 2014-08-13: 200 mg via ORAL
  Filled 2014-08-13: qty 1

## 2014-08-13 MED ORDER — SODIUM CHLORIDE 0.9 % IJ SOLN
3.0000 mL | Freq: Two times a day (BID) | INTRAMUSCULAR | Status: DC
  Administered 2014-08-13: 3 mL via INTRAVENOUS

## 2014-08-13 MED ORDER — APIXABAN 5 MG PO TABS
5.0000 mg | ORAL_TABLET | Freq: Two times a day (BID) | ORAL | Status: DC
  Administered 2014-08-13: 5 mg via ORAL
  Filled 2014-08-13: qty 1

## 2014-08-13 MED ORDER — SPIRONOLACTONE 25 MG PO TABS
12.5000 mg | ORAL_TABLET | Freq: Every day | ORAL | Status: DC
  Administered 2014-08-13: 12.5 mg via ORAL
  Filled 2014-08-13: qty 1

## 2014-08-13 MED ORDER — ONDANSETRON HCL 4 MG PO TABS: 4.0000 mg | ORAL_TABLET | Freq: Four times a day (QID) | ORAL | Status: DC | PRN

## 2014-08-13 MED ORDER — SODIUM CHLORIDE 0.9 % IV SOLN: 250.0000 mL | INTRAVENOUS | Status: DC | PRN

## 2014-08-13 MED ORDER — SODIUM CHLORIDE 0.9 % IJ SOLN: 3.0000 mL | INTRAMUSCULAR | Status: DC | PRN

## 2014-08-13 MED ORDER — ACETAMINOPHEN 325 MG PO TABS: 650.0000 mg | ORAL_TABLET | Freq: Four times a day (QID) | ORAL | Status: DC | PRN

## 2014-08-13 MED ORDER — SODIUM CHLORIDE 0.9 % IJ SOLN: 3.0000 mL | Freq: Two times a day (BID) | INTRAMUSCULAR | Status: DC

## 2014-08-13 MED ORDER — ATORVASTATIN CALCIUM 20 MG PO TABS: 20.0000 mg | ORAL_TABLET | Freq: Every day | ORAL | Status: DC

## 2014-08-13 MED ORDER — ACETAMINOPHEN 650 MG RE SUPP
650.0000 mg | Freq: Four times a day (QID) | RECTAL | Status: DC | PRN
Start: 2014-08-13 — End: 2014-08-13

## 2014-08-13 NOTE — Consult Note (Signed)
CARDIOLOGY CONSULT NOTE   Patient ID: Alejandro [name] MRN: [id number] DOB/AGE: [birth year] 81 y.o.  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 He underwent EP study which did not induce arrhythmia's on 08/05/2014 with Medtronic loop recorder placement per Dr. Caryl Comes. He was not placed on BB due to bradycardia. He was started on Eliquis, Lisinopril, spironolactone, and amiodarone.   He returned to the ER last evening with complaints of dizziness and associated tingling in his feet while sitting watching TV.  He states that he did not have chest pain, pressure, dyspnea or diaphoresis.   He states that he stood up and the dizziness passed. Because of his prior symptoms, he came to ER.   On arrival ER BP 144/84 HR 60, O2 Sat 97%; Hgb 13.1/39.8; Na+ 139, K+ 3.5, Creatinine 1.16; Glucose 113. CXR negative for pneumonia or CHF, NSR with 1st degree AV Block, QTc .460. PR .25. HR of 58 bpm. He was admitted for observation. He has been asymptomatic since admission.    No Known Allergies  Medications Scheduled Medications: . amiodarone  200 mg Oral BID  . apixaban  5 mg Oral BID  . atorvastatin  20 mg Oral q1800  . lisinopril  2.5 mg Oral Daily  . sodium chloride  3 mL Intravenous Q12H  . sodium chloride  3 mL Intravenous Q12H  . spironolactone  12.5 mg Oral Daily     PRN Medications:  sodium chloride, acetaminophen **OR** acetaminophen, morphine injection, ondansetron **OR** ondansetron  (ZOFRAN) IV, sodium chloride   Past Medical History  Diagnosis Date  . Essential hypertension   . Secondary cardiomyopathy     LVEF 40-45% January 2014  . A-fib   . Congestive dilated cardiomyopathy   . NSVT (nonsustained ventricular tachycardia)   . Syncope     Past Surgical History  Procedure Laterality Date  . Laparoscopic appendectomy  November 2015    Dr. [name]  . Left heart catheterization with coronary angiogram N/A 07/31/2014    Procedure: LEFT HEART CATHETERIZATION WITH CORONARY ANGIOGRAM;  Surgeon: [name], MD;  Location: [place] CATH LAB;  Service: Cardiovascular;  Laterality: N/A;  . Electrophysiology study N/A 08/05/2014    Procedure: ELECTROPHYSIOLOGY STUDY;  Surgeon: Deboraha Sprang, MD;  Location: [facility] CATH LAB;  Service: Cardiovascular;  Laterality: N/A;    Family History  Problem Relation Age of Onset  . Heart disease Father     Diagnosed in his 51s    Social History Mr. Vanvranken reports that he has never smoked. He has never used smokeless tobacco. Mr. Asbridge reports that he does not drink alcohol.  Review of Systems Complete review of systems are found to be negative unless outlined in H&P above.  Physical Examination Blood pressure 119/80, pulse 54, temperature 98.1 F (36.7 C), temperature source Oral, resp. rate 13, height 5' 8"  (1.727 m), weight 178 lb 9.6 oz (81.012 kg), SpO2 98 %. No intake or output data in the 24 hours ending 08/13/14 0847  Telemetry: NSR with 1st degree AV block   GEN: Resting quietly no acute distress  HEENT: Conjunctiva  and lids normal, oropharynx clear with moist mucosa. Neck: Supple, no elevated JVP or carotid bruits, no thyromegaly. Lungs: Clear to auscultation, nonlabored breathing at rest. Cardiac: Regular rate and rhythm, bradycardic, no S3 or significant systolic murmur, no pericardial rub. Abdomen: Soft, nontender, no hepatomegaly, bowel sounds present, no guarding or rebound. Extremities: No pitting edema, distal  pulses 2+. Skin: Warm and dry. Musculoskeletal: No kyphosis. Neuropsychiatric: Alert and oriented x3, affect grossly appropriate.  Prior Cardiac Testing/Procedures  1.Cardiac Cath 3./07/2014 Coronary angiography: Coronary dominance: Right   Left Main: Normal in size with minor irregularities. The vessel is mildly calcified.  Left Anterior Descending (LAD): Heavily calcified especially proximally. The vessel has mild diffuse atherosclerosis with no obstructive disease.  1st diagonal (D1): Medium in size with 50% ostial stenosis.  2nd diagonal (D2): Large in size with 50% proximal stenosis. This gives to normal size branches.  3rd diagonal (D3): Very small in size.  Circumflex (LCx): Normal in size with 60-70% ostial stenosis. There is 20% diffuse disease in the midsegment.  1st obtuse marginal: Small in size with minor irregularities.  2nd obtuse marginal: Medium in size with minor irregularities.  3rd obtuse marginal: Normal in size with minor irregularities.  Ramus Intermedius: Medium in size with 60% ostial stenosis.  Right Coronary Artery: Large in size and aneurysmal throughout its segment. Its tortuous proximally. There is sluggish flow throughout the vessel with possible layered thrombus distally.  Posterior descending artery: Aneurysmal proximally with no significant stenosis.  Posterior AV segment: Normal in size with mild diffuse disease.  Left ventriculography: Left ventricular systolic function is moderately to severely reduced , LVEF is estimated at 30-35 %, there is no significant mitral regurgitation   Final Conclusions:  1. 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 Borderline significant ostial left circumflex stenosis. Otherwise no evidence of obstructive coronary artery disease. 2. Moderately to severely reduced LV systolic function with an ejection fraction of 30-35%. Normal left  ventricular end-diastolic pressure.  Recommendations:  Recommend medical therapy for coronary artery disease. The stenosis in the ostial left circumflex is not optimal for PCI due to heavy calcifications at the ostium of the LAD and having to jail a ramus branch. Anticoagulation for atrial fibrillation with a NOAC can be initiated tomorrow. Recommend medical therapy for cardiomyopathy. ------------------------------------------------------------------------------------------------------------------------------------------------------- 2. EP Study 08/05/2014-[facility]  Preop Dx: nonsustained VT, ischemic cardiomyopathy, presyncope Postop Dx same/  Procedure:EPS with induction of arrhythmia' Findings no inducible arrhythmia  Plan proceed with Loop recorder insertion  3. Loop Recorder Insertion-08/05/2014-[facility]  A Medtronic LINQ Reveal Loop Recorder Serial Number [id number] was inserted.  ------------------------------------------------------------------------------------------------------------------------------------------------------- 4. NM Study 07/30/2014 IMPRESSION: 1. Moderate degree of myocardial scar in the inferolateral, mid inferoseptal, and inferoapical walls. No ischemic zones seen. 2. Inferior, inferoseptal, and inferoapical hypokinesis. 3. Left ventricular ejection fraction 27% 4. High-risk stress test findings* based upon reduced LV systolic function. However, given arrhythmia, would recommend echocardiography for a more accurate assessment of LV systolic function and regional wall motion if indicated.  (Stress/ECG data: The patient was stressed according to the Lexiscan protocol. The heart rate ranged from 112 to 146 beats per min. The blood pressure averaged 118/91. No chest pain was reported. The patient was found to be in rapid atrial fibrillation with frequent PVCs. With stress, there were multiple episodes of non sustained ventricular tachycardia with occasional  compensatory pauses lasting 2.5 seconds. The patient felt dizzy and lightheaded. He was subsequently admitted to the hospital for further observation)  ----------------------------------------------------------------------------------------------------------------------------------------------------  Echocardiogram 06/06/2014 Left ventricle: The cavity size was normal. Wall thickness was increased in a pattern of mild LVH. Systolic function was normal. The estimated ejection fraction was in the range of 50% to 55%. Probable hypokinesis of the basal-midinferolateral myocardium. Doppler parameters are consistent with abnormal left ventricular relaxation (grade 1 diastolic dysfunction). - Aortic valve: Trileaflet; mildly calcified leaflets width Lambl&'s excrescences. There was trivial regurgitation. - Aortic root: The aortic root was mildly ectatic. - Mitral valve: Calcified annulus. There was trivial regurgitation. - Right atrium: Central venous pressure (est): 3 mm Hg. - Tricuspid valve: There was trivial regurgitation. - Pulmonary arteries: PA peak pressure: 27 mm Hg (S). - Pericardium, extracardiac: There was no pericardial effusion.  Lab Results  Basic Metabolic Panel:  Recent Labs Lab 08/12/14 2342 08/13/14 0352  NA 139 141  K 3.5 3.2*  CL 106 107  CO2 26 27  GLUCOSE 113* 110*  BUN 13 13  CREATININE 1.16 1.18  CALCIUM 8.7 8.5    Liver Function Tests:  Recent Labs Lab 08/12/14 2342 08/13/14 0352  AST 19 16  ALT 12 12  ALKPHOS 73 56  BILITOT 0.5 0.5  PROT 6.7 6.3  ALBUMIN 3.6 3.4*    CBC:  Recent Labs Lab 08/12/14 2342 08/13/14 0352  WBC 8.1 7.6  NEUTROABS 4.0  --   HGB 13.1 12.6*  HCT 39.8 37.7*  MCV 94.3 95.0  PLT 208 188    Cardiac Enzymes:  Recent Labs Lab 08/12/14 2342 08/13/14 0352  TROPONINI 0.03 <0.03    Radiology: Dg Chest 2 View  08/13/2014   CLINICAL DATA:  Chest tightness and lightheadedness  EXAM: CHEST  2 VIEW   COMPARISON:  07/20/2014  FINDINGS: The heart size and mediastinal contours are within normal limits. Both lungs are clear. The visualized skeletal structures are unremarkable.  IMPRESSION: No active cardiopulmonary disease.   Electronically Signed   By: Andreas Newport M.D.   On: 08/13/2014 00:00     ECG: NSR, Bradycardia lst degree AV block. HR of 58 bpm.   Impression and Recommendations  1. Dizziness: Uncertain etiology at this time. Will have loop recorder interrogated to evaluate for arrhythmia or significant bradycardia. He has a Medtronic device. They have been informed to see him. He is on amiodarone only for HR control. Troponin negative X 3. Hx of NSVT. Consider decreasing amiodarone to 200 mg daily if no arrhythmia;s seen.   2. Hypertension; On lisinopril and spironolactone. Will check orthostatics although doubtful as he felt better when he stood up.   3. Atrial fib: On Eliquis. No evidence of anemia. HR is bradycardic on amiodarone. TSH was checked on last office admission. 1.989.  Amiodarone is at 200 mg BID. Will decrease to 200 mg daily as he is bradycardic, after loop recorder interrogation, if no arrhythmias seen.   4. Hypokalemia; On spironolactone without replacement of potassium Will give him a dose today. As he is on both spironolactone and ACE, will be careful concerning replacement. Check Mg+ Last level checked on 3.1.20-2016 was 1.9.     Signed: Phill Myron. Lawrence NP [place]  08/13/2014, 8:47 AM Co-Sign MD  Patient seen and discussed with NP Purcell Nails, I agree with her documentation. 81 yo male hx of syncope, HTN, afib, CAD, NSVT s/p EP study without inducible VT not with loop recorder, chronic systolic HF with LVEF 00% admitted dizziness. Occurred while sitting on the couch watching tv. Denies any chest pain or palpitations.    Echo Jan 2016: LVEF 50-55%,  probable hypokinesis of the basal midinferolateral wall, grade I diastolic dysfunction.  Dec 2013 Cardiolite:  inferior scar vs attenuation, no active ischemia 07/2014 CT chest: noted coronary atherosclerosis 07/2014 Lexiscan MPI: noted to have rapid afib with PVCs, multiple episodes of NSVT with compensatory pauses. Moderate sized scar in inferolateral, mid inferoseptal, and inferoapical walls. No active ischemia. LVEF 27% Cath 07/2014 LM patent, LAD patent, D1 and D2 50% disease, LCX 60-70% ostial, ramus 60% ostial, RCA aneurysmal with sluggish flow. LVEF 30-35%.  - K 3.5 Cr 1.16 Hgb 13.1 Plt 208 TSH 6.8 Mg 1.8 Trop neg x 3 EKG SR old incomplete LBBB   Medical therapy for systolic dysfunction has been limited due to bradycardia, he is off beta blocker.  Cardiac enzymes are negative x 3, EKG chronic LBBB cannot interpret for ischemia. Recent cath without obstructive disease. Will f/u results of loop recorder evaluation. Elevated TSH on amio, will add free T4 and T3 to labs and decrease dose to 254m daily. Continue eliquis for stroke prevention. Orthsotatics negative. F/u loop recorder results.     JZandra AbtsMD

## 2014-08-13 NOTE — Progress Notes (Signed)
Patient ID: Alejandro [name], male   DOB: [birth year], 81 y.o.   MRN: [id number]  Loop recorded interrogated, no significant arrhythmias noted. No clear cardiac cause at this time for his transient episode of dizziness. Free T4 and T3 pending, may be followed up at next outpatient f/u, we have decreased the dose of his amio. He will see Dr Domenic Polite tomorrow in clinic as previously scheduled, ok for discharge from our standpoint today.Will sign off.    Zandra Abts MD

## 2014-08-13 NOTE — H&P (Signed)
PCP:   Glenda Chroman., MD   Chief Complaint:  Dizziness and chest tightness  HPI: 81 year old male who   has a past medical history of Essential hypertension; Secondary cardiomyopathy; A-fib; Congestive dilated cardiomyopathy; NSVT (nonsustained ventricular tachycardia); and Syncope. Patient was recently discharged from the hospital from cardiology service after patient underwent cardiac cath which showed aneurysmal RCA with nonobstructive suspected chronic thrombus distally and 60-70% ostial left circumflex stenosis. Patient also was found to be in A. fib with RVR and started on a noted on along with anticoagulation with apixaban. Today patient was watching TV and felt dizzy and chest tightness which lasted for few minutes also had bilateral tingling of the feet. He denies passing out, no shortness of breath. Patient got concerned and came to the ED for further evaluation. EKG shows normal sinus rhythm, heart is enzymes are negative in the ED. He denies nausea vomiting or diarrhea. No fever dysuria urgency frequency of urination.  Allergies:  No Known Allergies    Past Medical History  Diagnosis Date  . Essential hypertension   . Secondary cardiomyopathy     LVEF 40-45% January 2014  . A-fib   . Congestive dilated cardiomyopathy   . NSVT (nonsustained ventricular tachycardia)   . Syncope     Past Surgical History  Procedure Laterality Date  . Laparoscopic appendectomy  November 2015    Dr. [name]  . Left heart catheterization with coronary angiogram N/A 07/31/2014    Procedure: LEFT HEART CATHETERIZATION WITH CORONARY ANGIOGRAM;  Surgeon: [name], MD;  Location: [place] CATH LAB;  Service: Cardiovascular;  Laterality: N/A;  . Electrophysiology study N/A 08/05/2014    Procedure: ELECTROPHYSIOLOGY STUDY;  Surgeon: Deboraha Sprang, MD;  Location: [facility] CATH LAB;  Service: Cardiovascular;  Laterality: N/A;    Prior to Admission medications   Medication Sig Start Date End Date  Taking? Authorizing Provider  amiodarone (PACERONE) 200 MG tablet Take 1 tablet (200 mg total) by mouth 2 (two) times daily. 08/06/14   Chanetta Marshall, NP  apixaban (ELIQUIS) 5 MG TABS tablet Take 1 tablet (5 mg total) by mouth 2 (two) times daily. 08/06/14   Chanetta Marshall, NP  atorvastatin (LIPITOR) 20 MG tablet Take 1 tablet (20 mg total) by mouth daily at 6 PM. 08/06/14   Chanetta Marshall, NP  lisinopril (PRINIVIL,ZESTRIL) 2.5 MG tablet Take 1 tablet (2.5 mg total) by mouth daily. 08/06/14   Chanetta Marshall, NP  spironolactone (ALDACTONE) 25 MG tablet Take 0.5 tablets (12.5 mg total) by mouth daily. 08/06/14   Chanetta Marshall, NP    Social History:  reports that he has never smoked. He has never used smokeless tobacco. He reports that he does not drink alcohol or use illicit drugs.  Family History  Problem Relation Age of Onset  . Heart disease Father     Diagnosed in his 46s     All the positives are listed in BOLD  Review of Systems:  HEENT: Headache, blurred vision, runny nose, sore throat, dizziness Neck: Hypothyroidism, hyperthyroidism,,lymphadenopathy Chest : Shortness of breath, history of COPD, Asthma Heart : Chest pain, history of coronary arterey disease GI:  Nausea, vomiting, diarrhea, constipation, GERD GU: Dysuria, urgency, frequency of urination, hematuria Neuro: Stroke, seizures, syncope Psych: Depression, anxiety, hallucinations   Physical Exam: Blood pressure 115/81, pulse 59, temperature 98 F (36.7 C), temperature source Oral, resp. rate 13, height 5\' 8"  (1.727 m), weight 79.833 kg (176 lb), SpO2 97 %. Constitutional:   Patient is a well-developed and  well-nourished male* in no acute distress and cooperative with exam. Head: Normocephalic and atraumatic Mouth: Mucus membranes moist Eyes: PERRL, EOMI, conjunctivae normal Neck: Supple, No Thyromegaly Cardiovascular: RRR, S1 normal, S2 normal Pulmonary/Chest: CTAB, no wheezes, rales, or rhonchi Abdominal: Soft. Non-tender,  non-distended, bowel sounds are normal, no masses, organomegaly, or guarding present.  Neurological: A&O x3, Strength is normal and symmetric bilaterally, cranial nerve II-XII are grossly intact, no focal motor deficit, sensory intact to light touch bilaterally.  Extremities : No Cyanosis, Clubbing or Edema  Labs on Admission:  Basic Metabolic Panel:  Recent Labs Lab 08/12/14 2342  NA 139  K 3.5  CL 106  CO2 26  GLUCOSE 113*  BUN 13  CREATININE 1.16  CALCIUM 8.7   Liver Function Tests:  Recent Labs Lab 08/12/14 2342  AST 19  ALT 12  ALKPHOS 73  BILITOT 0.5  PROT 6.7  ALBUMIN 3.6   No results for input(s): LIPASE, AMYLASE in the last 168 hours. No results for input(s): AMMONIA in the last 168 hours. CBC:  Recent Labs Lab 08/06/14 0521 08/12/14 2342  WBC 7.8 8.1  NEUTROABS  --  4.0  HGB 13.4 13.1  HCT 40.3 39.8  MCV 93.5 94.3  PLT 181 208   Cardiac Enzymes:  Recent Labs Lab 08/12/14 2342  TROPONINI 0.03    BNP (last 3 results)  Recent Labs  08/02/14 0255  BNP 285.1*    ProBNP (last 3 results) No results for input(s): PROBNP in the last 8760 hours.  CBG: No results for input(s): GLUCAP in the last 168 hours.  Radiological Exams on Admission: Dg Chest 2 View  08/13/2014   CLINICAL DATA:  Chest tightness and lightheadedness  EXAM: CHEST  2 VIEW  COMPARISON:  07/20/2014  FINDINGS: The heart size and mediastinal contours are within normal limits. Both lungs are clear. The visualized skeletal structures are unremarkable.  IMPRESSION: No active cardiopulmonary disease.   Electronically Signed   By: Andreas Newport M.D.   On: 08/13/2014 00:00    EKG: Independently reviewed. *Sinus rhythm, incomplete LBBB  Assessment/Plan Principal Problem:   Chest pain Active Problems:  Atrial fibrillation  Hyperlipiemia  Chest pain We'll admit the patient under observation and cycle the cardiac enzymes. Chest pain has now resolved cortical enzymes are  negative in the ED EKG shows incomplete LBBB which is stable from previous EKG from 08/02/2014.  Atrial fibrillation Patient is currently on amiodarone  and apixaban, will continue on these medications. EKG shows sinus rhythm.  Hyperlipidemia Continue statin  Code status:Full code  Family discussion: Discussed with patient and wife at bedside   Time Spent on Admission: 50 minutes  [facility] Pager: [phone] 08/13/2014, 2:07 AM  If 7PM-7AM, please contact night-coverage  [email or web address]  Password [email or web address]

## 2014-08-13 NOTE — Progress Notes (Signed)
Alejandro [name] discharged home with wife per MD order.  Discharge instructions reviewed and discussed with the patient, all questions and concerns answered. Copy of instructions and scripts given to patient.    Medication List    TAKE these medications        amiodarone 200 MG tablet  Commonly known as:  PACERONE  Take 1 tablet (200 mg total) by mouth daily.  Start taking on:  08/14/2014     apixaban 5 MG Tabs tablet  Commonly known as:  ELIQUIS  Take 1 tablet (5 mg total) by mouth 2 (two) times daily.     atorvastatin 20 MG tablet  Commonly known as:  LIPITOR  Take 1 tablet (20 mg total) by mouth daily at 6 PM.     lisinopril 2.5 MG tablet  Commonly known as:  PRINIVIL,ZESTRIL  Take 1 tablet (2.5 mg total) by mouth daily.     spironolactone 25 MG tablet  Commonly known as:  ALDACTONE  Take 0.5 tablets (12.5 mg total) by mouth daily.        Patients skin is clean, dry and intact, no evidence of skin break down. IV site discontinued and catheter remains intact. Site without signs and symptoms of complications. Dressing and pressure applied.  Patient escorted to car by Lovena Le, RN in a wheelchair,  no distress noted upon discharge.  [name] 08/13/2014 4:40 PM

## 2014-08-13 NOTE — Discharge Summary (Signed)
Physician Discharge Summary  Alejandro [name] EHU:[id number] DOB: [birth year] DOA: 08/12/2014  PCP: Glenda Chroman., MD  Admit date: 08/12/2014 Discharge date: 08/13/2014  Time spent: 45 minutes  Recommendations for Outpatient Follow-up:  -Will be discharged home today. -Will follow up in clinic with Dr. Domenic Polite tomorrow as previously scheduled. -T3/T4 results need to be followed.   Discharge Diagnoses:  Principal Problem:   Chest pain Active Problems:   Chest tightness   Dizziness   Episodic lightheadedness   Discharge Condition: Stable and improved  Filed Weights   08/12/14 2256 08/13/14 0254  Weight: 79.833 kg (176 lb) 81.012 kg (178 lb 9.6 oz)    History of present illness:  81 year old male who  has a past medical history of Essential hypertension; Secondary cardiomyopathy; A-fib; Congestive dilated cardiomyopathy; NSVT (nonsustained ventricular tachycardia); and Syncope. Patient was recently discharged from the hospital from cardiology service after patient underwent cardiac cath which showed aneurysmal RCA with nonobstructive suspected chronic thrombus distally and 60-70% ostial left circumflex stenosis. Patient also was found to be in A. fib with RVR and started on a noted on along with anticoagulation with apixaban. Today patient was watching TV and felt dizzy and chest tightness which lasted for few minutes also had bilateral tingling of the feet. He denies passing out, no shortness of breath. Patient got concerned and came to the ED for further evaluation. EKG shows normal sinus rhythm, heart is enzymes are negative in the ED. He denies nausea vomiting or diarrhea. No fever dysuria urgency frequency of urination.  Hospital Course:   Dizziness -Uncertain etiology. -Loop recorder interrogated and no arrhthymias noted. -No focal deficits. -Has been bradycardic, which has limited medical therapy for systolic dysfunction. -Ruled out for ACS. -Amio dose has been  decreased to 200 daily given elevated TSH. T3/T4 pending. -Continue eliquis for CVA prevention.  Procedures:  None   Consultations:  Cardiology, Dr. Harl Bowie  Discharge Instructions  Discharge Instructions    Diet - low sodium heart healthy    Complete by:  As directed      Increase activity slowly    Complete by:  As directed             Medication List    TAKE these medications        amiodarone 200 MG tablet  Commonly known as:  PACERONE  Take 1 tablet (200 mg total) by mouth daily.  Start taking on:  08/14/2014     apixaban 5 MG Tabs tablet  Commonly known as:  ELIQUIS  Take 1 tablet (5 mg total) by mouth 2 (two) times daily.     atorvastatin 20 MG tablet  Commonly known as:  LIPITOR  Take 1 tablet (20 mg total) by mouth daily at 6 PM.     lisinopril 2.5 MG tablet  Commonly known as:  PRINIVIL,ZESTRIL  Take 1 tablet (2.5 mg total) by mouth daily.     spironolactone 25 MG tablet  Commonly known as:  ALDACTONE  Take 0.5 tablets (12.5 mg total) by mouth daily.       No Known Allergies     Follow-up Information    Follow up with Rozann Lesches, MD.   Specialty:  Cardiology   Why:  as scheduled for tomorrow   Contact information:   [place] [address] [phone]        The results of significant diagnostics from this hospitalization (including imaging, microbiology, ancillary and laboratory) are listed below for reference.  Significant Diagnostic Studies: Dg Chest 2 View  08/13/2014   CLINICAL DATA:  Chest tightness and lightheadedness  EXAM: CHEST  2 VIEW  COMPARISON:  07/20/2014  FINDINGS: The heart size and mediastinal contours are within normal limits. Both lungs are clear. The visualized skeletal structures are unremarkable.  IMPRESSION: No active cardiopulmonary disease.   Electronically Signed   By: Andreas Newport M.D.   On: 08/13/2014 00:00   Nm Myocar Multi W/spect W/wall Motion / Ef  07/30/2014   CLINICAL DATA:   81 year old male with a history of syncope and wall motion abnormalities on echocardiography referred for an ischemic evaluation.  EXAM: MYOCARDIAL IMAGING WITH SPECT (REST AND PHARMACOLOGIC-STRESS)  GATED LEFT VENTRICULAR WALL MOTION STUDY  LEFT VENTRICULAR EJECTION FRACTION  TECHNIQUE: Standard myocardial SPECT imaging was performed after resting intravenous injection of 10 mCi Tc-70m sestamibi. Subsequently, intravenous infusion of Lexiscan was performed under the supervision of the Cardiology staff. At peak effect of the drug, 30 mCi Tc-72m sestamibi was injected intravenously and standard myocardial SPECT imaging was performed. Quantitative gated imaging was also performed to evaluate left ventricular wall motion, and estimate left ventricular ejection fraction.  COMPARISON:  None.  FINDINGS: Stress/ECG data: The patient was stressed according to the Lexiscan protocol. The heart rate ranged from 112 to 146 beats per min. The blood pressure averaged 118/91. No chest pain was reported. The patient was found to be in rapid atrial fibrillation with frequent PVCs. With stress, there were multiple episodes of non sustained ventricular tachycardia with occasional compensatory pauses lasting 2.5 seconds. The patient felt dizzy and lightheaded. He was subsequently admitted to the hospital for further observation.  Perfusion: There was a moderate sized area of mild to moderately decreased uptake in the inferolateral, mid inferoseptal, and inferoapical walls with no reversibility, suggestive of myocardial scar. No ischemic territories were seen.  Wall Motion: Inferior, inferoseptal, and inferoapical hypokinesis. No left ventricular dilation.  Left Ventricular Ejection Fraction: 27 %  End diastolic volume 355 ml  End systolic volume 83 ml  IMPRESSION: 1. Moderate degree of myocardial scar in the inferolateral, mid inferoseptal, and inferoapical walls. No ischemic zones seen.  2. Inferior, inferoseptal, and inferoapical  hypokinesis.  3. Left ventricular ejection fraction 27%  4. High-risk stress test findings* based upon reduced LV systolic function. However, given arrhythmia, would recommend echocardiography for a more accurate assessment of LV systolic function and regional wall motion if indicated.  *2012 Appropriate Use Criteria for Coronary Revascularization Focused Update: J Am Coll Cardiol. 9741;63(8):453-646. http://content.airportbarriers.com.aspx?articleid=1201161   Electronically Signed   By: Kate Sable   On: 07/30/2014 15:18    Microbiology: No results found for this or any previous visit (from the past 240 hour(s)).   Labs: Basic Metabolic Panel:  Recent Labs Lab 08/12/14 2342 08/13/14 0352 08/13/14 0600  NA 139 141  --   K 3.5 3.2*  --   CL 106 107  --   CO2 26 27  --   GLUCOSE 113* 110*  --   BUN 13 13  --   CREATININE 1.16 1.18  --   CALCIUM 8.7 8.5  --   MG  --   --  1.8   Liver Function Tests:  Recent Labs Lab 08/12/14 2342 08/13/14 0352  AST 19 16  ALT 12 12  ALKPHOS 73 56  BILITOT 0.5 0.5  PROT 6.7 6.3  ALBUMIN 3.6 3.4*   No results for input(s): LIPASE, AMYLASE in the last 168 hours. No results for input(s):  AMMONIA in the last 168 hours. CBC:  Recent Labs Lab 08/12/14 2342 08/13/14 0352  WBC 8.1 7.6  NEUTROABS 4.0  --   HGB 13.1 12.6*  HCT 39.8 37.7*  MCV 94.3 95.0  PLT 208 188   Cardiac Enzymes:  Recent Labs Lab 08/12/14 2342 08/13/14 0352 08/13/14 0903  TROPONINI 0.03 <0.03 0.03   BNP: BNP (last 3 results)  Recent Labs  08/02/14 0255  BNP 285.1*    ProBNP (last 3 results) No results for input(s): PROBNP in the last 8760 hours.  CBG: No results for input(s): GLUCAP in the last 168 hours.     SignedLelon Frohlich  [facility] Pager: [phone] 08/13/2014, 4:02 PM

## 2014-08-13 NOTE — Progress Notes (Signed)
UR completed 

## 2014-08-14 ENCOUNTER — Ambulatory Visit (INDEPENDENT_AMBULATORY_CARE_PROVIDER_SITE_OTHER): Payer: Medicare Other | Admitting: Cardiology

## 2014-08-14 ENCOUNTER — Encounter: Payer: Self-pay | Admitting: Cardiology

## 2014-08-14 VITALS — BP 98/64 | HR 78 | Ht 68.0 in | Wt 172.0 lb

## 2014-08-14 DIAGNOSIS — I4729 Other ventricular tachycardia: Secondary | ICD-10-CM

## 2014-08-14 DIAGNOSIS — I472 Ventricular tachycardia: Secondary | ICD-10-CM

## 2014-08-14 DIAGNOSIS — Z8673 Personal history of transient ischemic attack (TIA), and cerebral infarction without residual deficits: Secondary | ICD-10-CM | POA: Diagnosis not present

## 2014-08-14 DIAGNOSIS — I2510 Atherosclerotic heart disease of native coronary artery without angina pectoris: Secondary | ICD-10-CM | POA: Diagnosis not present

## 2014-08-14 DIAGNOSIS — I429 Cardiomyopathy, unspecified: Secondary | ICD-10-CM | POA: Diagnosis not present

## 2014-08-14 DIAGNOSIS — I480 Paroxysmal atrial fibrillation: Secondary | ICD-10-CM | POA: Diagnosis not present

## 2014-08-14 NOTE — Patient Instructions (Signed)
Your physician recommends that you schedule a follow-up appointment in: 1 month. Your physician recommends that you continue on your current medications as directed. Please refer to the Current Medication list given to you today. Please take your amiodarone in the mornings. Please take your lisinopril in the evening. Your physician recommends that you have lab work in 1 month just before your next visit to check your TSH, Free T3 & T4. You have been given your lab orders today.

## 2014-08-14 NOTE — Progress Notes (Signed)
Cardiology Office Note  Date: 08/14/2014   ID: DONTEE [name], DOB Feb 19, 1933, MRN [id number]  PCP: Glenda Chroman., MD  Primary Cardiologist: Rozann Lesches, MD   Chief Complaint  Patient presents with  . Hospitalization Follow-up  . Cardiomyopathy  . Coronary Artery Disease  . Atrial Fibrillation    History of Present Illness: Alejandro [name] is a 81 y.o. male presenting for a post hospital visit. Initial hospitalization at [facility] was in early March with documentation of rapid atrial fibrillation and further assessment of ischemic heart disease and cardiomyopathy following an abnormal Cardiolite. Cardiac catheterization demonstrated mild diffuse LAD disease, 50% diagonal stenoses, 60-70% ostial circumflex stenosis, 60% ramus intermedius stenosis, and a large aneurysmal RCA with sluggish flow and possibly layered thrombus noted distally. LVEF was 30-35% at angiography. Dr. Fletcher Anon felt that medical therapy was the best option at that point. Since he had also been noted to have NSVT, he did undergo EP study with Dr. Caryl Comes that demonstrated no inducible arrhythmias. Ultimately an implantable loop recorder was placed for further assessment. He was not able to tolerate Coreg due to bradycardia, although was placed on amiodarone as well as Eliquis for stroke prophylaxis with atrial fibrillation. Following that discharge from the hospital, he was readmitted at [facility] after an episode of chest pain that occurred at rest. There was no evidence of ACS or definitive arrhythmia at that time including on interrogation of his loop recorder. Amiodarone dose was reduced due to elevated TSH 6.8. Otherwise no changes made.  He is here with his wife today for a follow-up visit. I reviewed extensive records with them and updated them on the findings of his hospitalizations. He tells me that he actually had no chest pain prompting the recent hospital stay at [facility]. Rather, he was feeling dizzy when he  stood up suddenly, and felt that this needed to be checked out further. His blood pressure is low to low normal, and this has limited up titration of medications.  Suspicion at this time is that he has a mixed cardiomyopathy, not necessarily ischemia provoked, and perhaps even related to atrial fibrillation if this has been a recurring issue that perhaps he has not sensed. We discussed the rationale for continuing stroke prophylaxis with Eliquis, also rhythm suppression with amiodarone. Otherwise he will try and remain on a very low-dose lisinopril, shifting the dose to the evening, as well as Aldactone and Lipitor.   Past Medical History  Diagnosis Date  . Essential hypertension   . Secondary cardiomyopathy     LVEF 40-45% January 2014  . Atrial fibrillation   . Congestive dilated cardiomyopathy   . NSVT (nonsustained ventricular tachycardia)   . Syncope     Past Surgical History  Procedure Laterality Date  . Laparoscopic appendectomy  November 2015    Dr. [name]  . Left heart catheterization with coronary angiogram N/A 07/31/2014    Procedure: LEFT HEART CATHETERIZATION WITH CORONARY ANGIOGRAM;  Surgeon: [name], MD;  Location: [place] CATH LAB;  Service: Cardiovascular;  Laterality: N/A;  . Electrophysiology study N/A 08/05/2014    Procedure: ELECTROPHYSIOLOGY STUDY;  Surgeon: Deboraha Sprang, MD;  Location: [facility] CATH LAB;  Service: Cardiovascular;  Laterality: N/A;    Current Outpatient Prescriptions  Medication Sig Dispense Refill  . amiodarone (PACERONE) 200 MG tablet Take 1 tablet (200 mg total) by mouth daily. In the morning 30 tablet 3  . apixaban (ELIQUIS) 5 MG TABS tablet Take 1 tablet (5 mg  total) by mouth 2 (two) times daily. 60 tablet 1  . atorvastatin (LIPITOR) 20 MG tablet Take 1 tablet (20 mg total) by mouth daily at 6 PM. 30 tablet 1  . lisinopril (PRINIVIL,ZESTRIL) 2.5 MG tablet Take 1 tablet (2.5 mg total) by mouth daily. In the evening 30 tablet 3  . spironolactone  (ALDACTONE) 25 MG tablet Take 0.5 tablets (12.5 mg total) by mouth daily. 30 tablet 1   No current facility-administered medications for this visit.    Allergies:  Review of patient's allergies indicates no known allergies.   Social History: The patient  reports that he has never smoked. He has never used smokeless tobacco. He reports that he does not drink alcohol or use illicit drugs.   ROS:  Please see the history of present illness. Otherwise, complete review of systems is positive for intermittent dizziness and visual blurriness. No focal motor weakness. No palpitations. No chest pain..  All other systems are reviewed and negative.    Physical Exam: VS:  BP 98/64 mmHg  Pulse 78  Ht 5\' 8"  (1.727 m)  Wt 172 lb (78.019 kg)  BMI 26.16 kg/m2  SpO2 98%, BMI Body mass index is 26.16 kg/(m^2).  Wt Readings from Last 3 Encounters:  08/14/14 172 lb (78.019 kg)  08/13/14 178 lb 9.6 oz (81.012 kg)  07/31/14 176 lb 2.4 oz (79.9 kg)     General: Patient appears comfortable at rest. HEENT: Conjunctiva and lids normal, oropharynx clear with moist mucosa. Neck: Supple, no elevated JVP or carotid bruits, no thyromegaly. Lungs: Clear to auscultation, nonlabored breathing at rest. Cardiac: Regular rate and rhythm, indistinct PMI, no S3 or significant systolic murmur. Abdomen: Soft, nontender, bowel sounds present, no guarding or rebound. Extremities: No pitting edema, distal pulses 2+. Skin: Warm and dry. Musculoskeletal: No kyphosis. Neuropsychiatric: Alert and oriented x3, affect grossly appropriate.   ECG: Tracing from 08/12/2014 reviewed showing sinus rhythm with PR interval 254 ms and IVCD.  Recent Labwork: 08/02/2014: B Natriuretic Peptide 285.1* 08/13/2014: ALT 12; AST 16; BUN 13; Creatinine 1.18; Hemoglobin 12.6*; Magnesium 1.8; Platelets 188; Potassium 3.2*; Sodium 141; TSH 6.813*     Component Value Date/Time   CHOL 138 08/03/2014 0304   TRIG 73 08/03/2014 0304   HDL 33*  08/03/2014 0304   CHOLHDL 4.2 08/03/2014 0304   VLDL 15 08/03/2014 0304   LDLCALC 90 08/03/2014 0304    Assessment and Plan:  1. Recently documented paroxysmal atrial fibrillation, CHADSVASC score is 6. We will continue amiodarone at 200 mg daily as well as Eliquis. No other rate control agents with documented bradycardia that has been symptomatic. Recheck TSH with free T3 and T4 for next visit in one month. He is in sinus rhythm today.  2. Secondary cardiomyopathy, probably mixed picture as outlined above. No beta blocker with symptomatic bradycardia. Low to low normal blood pressure also limits other medications. We will try to keep him on lisinopril at 2.5 mg once in the evening, also Aldactone.  3. NSVT, negative EP study for inducible arrhythmias. Loop recorder was placed by Dr. Caryl Comes. Follow-up interrogation in the device clinic.  4. History of possible stroke or TIA at [facility], details not entirely clear.  5. Recent LDL 90, on Lipitor.  6. Moderate multivessel CAD, being managed medically at this time.  Current medicines are reviewed at length with the patient today.  The patient does not have concerns regarding medicines.  Orders Placed This Encounter  Procedures  . TSH  . T3, free  .  T4, free    Disposition: FU with me in 1 month.   Signed, Satira Sark, MD, [facility] 08/14/2014 11:45 AM    [facility], [place], [place] [address] Phone: [phone]; Fax: [phone]

## 2014-08-16 ENCOUNTER — Ambulatory Visit: Payer: Medicare Other

## 2014-08-30 ENCOUNTER — Telehealth: Payer: Self-pay | Admitting: *Deleted

## 2014-08-30 DIAGNOSIS — I4891 Unspecified atrial fibrillation: Secondary | ICD-10-CM

## 2014-08-30 NOTE — Telephone Encounter (Signed)
Patient and wife informed and verbalized understanding of plan. 

## 2014-08-30 NOTE — Telephone Encounter (Signed)
Please have patient stop taking eliquis. Have him monitor his stools over the weekend, if bleeding increases he needs to come to the ER. Please order a CBC for Monday. Please see if he is willing to come to [facility] Monday to be added on to our nurse practitioners schedule Jory Sims).  Zandra Abts MD

## 2014-08-30 NOTE — Telephone Encounter (Signed)
Patient called office because he saw a small amount of blood in his stool a couple of times and states that this morning it was an increased amount of blood.. No c/o dizziness, chest pain or sob. Patient is currently taking eliquis 5 mg twice daily. Patient has already taken his medications this morning including eliquis. Patient informed that MD would be informed.Please advise.

## 2014-09-02 ENCOUNTER — Encounter: Payer: Self-pay | Admitting: Adult Health

## 2014-09-02 ENCOUNTER — Ambulatory Visit (INDEPENDENT_AMBULATORY_CARE_PROVIDER_SITE_OTHER): Payer: Medicare Other | Admitting: Adult Health

## 2014-09-02 VITALS — BP 110/62 | HR 55 | Ht 68.0 in | Wt 180.0 lb

## 2014-09-02 DIAGNOSIS — K921 Melena: Secondary | ICD-10-CM

## 2014-09-02 DIAGNOSIS — I480 Paroxysmal atrial fibrillation: Secondary | ICD-10-CM | POA: Diagnosis not present

## 2014-09-02 DIAGNOSIS — I255 Ischemic cardiomyopathy: Secondary | ICD-10-CM | POA: Diagnosis not present

## 2014-09-02 NOTE — Progress Notes (Addendum)
Cardiology Office Note   Date:  09/02/2014   ID:  Alejandro [name], DOB [birth year], MRN [id number]  PCP:  Glenda Chroman., MD  Cardiologist:  [place]/ Jory Sims, NP   Chief Complaint  Patient presents with  . Coronary Artery Disease  . Cardiomyopathy  . Atrial Fibrillation      History of Present Illness: Alejandro [name] is a 81 y.o. male who presents for ongoing assessment and management of ischemic heart MI with the with LVEF of 30-35%, most recent catheterization in March of 2016, demonstrating mild diffuse LAD disease, 50% diagonal stenosis, 60-70% ostial circumflex stenosis, 60%, ramus intermedius stenosis, and a large aneurysmal RCA was sluggish flow and possibly layered thrombus noted distally.  He was treated with medical therapy.  The patient also had an EP study with Dr. Caryl Comes.  The demonstrated no inducible arrhythmias, completed during hospitalization, where he was found to have  NSVT,he was unable to tolerate coronary due to bradycardia, and was started on amiodarone, and eloquence for stroke prophylaxis qwith CHADS VASC Score of 6.   He was last seen in the office on 08/14/2014, will need to have his TSH checked today.  He remained in normal sinus rhythm.  He was continued on the central 2.5 mg.  He is here for one-month followup for close evaluation.  Due to multiple cardiac issues.   He comes today with complaints of bleeding from his rectum.he states it has been about a teaspoon up bright red blood, sometimes less, sometimes not at all, when he has a bowel movement.  He also has been noticing old blood in a string attached to his stool when he has a bowel movement and he has had some painful bowel movements over the week prior to this office visit.  He called, and reported this to our office and was advised by Dr. Harl Bowie to stop Eliquis and to continue to monitor his bowel movements forworsening bleeding. increased bleeding.the patient is completely asymptomatic with the  exception of some painful bowel movements.  He denies any abdominal pain, hemoptysis, hematemesis, dyspnea, dizziness, or chest pain.  CBC has been drawn, but results are not available at this time.  Past Medical History  Diagnosis Date  . Essential hypertension   . Secondary cardiomyopathy     LVEF 40-45% January 2014  . Atrial fibrillation   . Congestive dilated cardiomyopathy   . NSVT (nonsustained ventricular tachycardia)   . Syncope     Past Surgical History  Procedure Laterality Date  . Laparoscopic appendectomy  November 2015    Dr. [name]  . Left heart catheterization with coronary angiogram N/A 07/31/2014    Procedure: LEFT HEART CATHETERIZATION WITH CORONARY ANGIOGRAM;  Surgeon: [name], MD;  Location: [place] CATH LAB;  Service: Cardiovascular;  Laterality: N/A;  . Electrophysiology study N/A 08/05/2014    Procedure: ELECTROPHYSIOLOGY STUDY;  Surgeon: Deboraha Sprang, MD;  Location: [facility] CATH LAB;  Service: Cardiovascular;  Laterality: N/A;     Current Outpatient Prescriptions  Medication Sig Dispense Refill  . amiodarone (PACERONE) 200 MG tablet Take 1 tablet (200 mg total) by mouth daily. In the morning 30 tablet 3  . apixaban (ELIQUIS) 5 MG TABS tablet Take 1 tablet (5 mg total) by mouth 2 (two) times daily. (Patient not taking: Reported on 08/30/2014) 60 tablet 1  . atorvastatin (LIPITOR) 20 MG tablet Take 1 tablet (20 mg total) by mouth daily at 6 PM. 30 tablet 1  . lisinopril (PRINIVIL,ZESTRIL) 2.5 MG tablet  Take 1 tablet (2.5 mg total) by mouth daily. In the evening 30 tablet 3  . spironolactone (ALDACTONE) 25 MG tablet Take 0.5 tablets (12.5 mg total) by mouth daily. 30 tablet 1   No current facility-administered medications for this visit.    Allergies:   Review of patient's allergies indicates no known allergies.    Social History:  The patient  reports that he has never smoked. He has never used smokeless tobacco. He reports that he does not drink alcohol or  use illicit drugs.   Family History:  The patient's family history includes Heart disease in his father.    ROS: .   All other systems are reviewed and negative.Unless otherwise mentioned in H&P above.   PHYSICAL EXAM: VS:  There were no vitals taken for this visit. , BMI There is no weight on file to calculate BMI. GEN: Well nourished, well developed, in no acute distress HEENT: normal Neck: no JVD, carotid bruits, or masses Cardiac: RRR; no murmurs, rubs, or gallops,no edema  Respiratory:  clear to auscultation bilaterally, normal work of breathing GI: soft, nontender, nondistended, + BS MS: no deformity or atrophy Skin: warm and dry, no rash Neuro:  Strength and sensation are intact Psych: euthymic mood, full affect   Recent Labs: 08/02/2014: B Natriuretic Peptide 285.1* 08/13/2014: ALT 12; BUN 13; Creatinine 1.18; Hemoglobin 12.6*; Magnesium 1.8; Platelets 188; Potassium 3.2*; Sodium 141; TSH 6.813*    Lipid Panel    Component Value Date/Time   CHOL 138 08/03/2014 0304   TRIG 73 08/03/2014 0304   HDL 33* 08/03/2014 0304   CHOLHDL 4.2 08/03/2014 0304   VLDL 15 08/03/2014 0304   LDLCALC 90 08/03/2014 0304      Wt Readings from Last 3 Encounters:  08/14/14 172 lb (78.019 kg)  08/13/14 178 lb 9.6 oz (81.012 kg)  07/31/14 176 lb 2.4 oz (79.9 kg)      Other studies Reviewed: Additional studies/ records that were reviewed today include: requested results of CBC drawn 09/02/2014   ASSESSMENT AND PLAN:  1. GI Bleeding: patient has been noticing bright red blood with bowel movements, sometimes painful bowel movements, also has noticed blood, which is old.  Attached to stool.  I am referring him to GI for him to be seen this week.  We will continue to hold Eliquis.  Have also discussed with him the need to followup with GI ASAP.  When they call him.  I am waiting for lab results for CBC, which were drawn today.  The patient is aware of the risk of a CVA stopping eloquence,  but we need to get to the etiology of his melena. I have spoken with Dr. Domenic Polite, his primary cardiologist, who is in agreement with stopping Eliiquis and need to see GI as soon as possible.we are calling to have an appointment made this week.   2. Ischemic cardiomyopathy: LVEF of 40-45%.  He is not on beta blocker secondary to bradycardia.Continue ACE inhibitor, and spiral lactone as directed  3. Atrial fibrillation: Remains in normal sinus rhythm, continue amiodarone. Heart rate is well controlled currently.   Current medicines are reviewed at length with the patient today.    Labs/ tests ordered today include No orders of the defined types were placed in this encounter.     Disposition:   FU with 1 month after GI evaluation.  Signed, Jory Sims, NP  09/02/2014 11:20 AM    [address],  [place], [place] [address] Phone: [phone]; Fax: [phone]

## 2014-09-02 NOTE — Patient Instructions (Signed)
Your physician recommends that you schedule a follow-up appointment after GI consult  Your physician has recommended you make the following change in your medication:   HOLD: Eliquis  You have been referred to Dr. Laural Golden  Thank you for choosing [facility]!

## 2014-09-02 NOTE — Addendum Note (Signed)
Addended by: Lendon Colonel on: 09/02/2014 03:40 PM   Modules accepted: Level of Service

## 2014-09-02 NOTE — Progress Notes (Deleted)
Name: Alejandro [name]    DOB: 02-27-1933  Age: 81 y.o.  MR#: [id number]       PCP:  Glenda Chroman., MD      Insurance: Payor: MEDICARE / Plan: MEDICARE PART A AND B / Product Type: *No Product type* /   CC:    Chief Complaint  Patient presents with  . Coronary Artery Disease  . Cardiomyopathy  . Atrial Fibrillation    VS Filed Vitals:   09/02/14 1438  BP: 110/62  Pulse: 55  Height: 5\' 8"  (1.727 m)  Weight: 180 lb (81.647 kg)  SpO2: 95%    Weights Current Weight  09/02/14 180 lb (81.647 kg)  08/14/14 172 lb (78.019 kg)  08/13/14 178 lb 9.6 oz (81.012 kg)    Blood Pressure  BP Readings from Last 3 Encounters:  09/02/14 110/62  08/14/14 98/64  08/13/14 131/81     Admit date:  (Not on file) Last encounter with RMR:  Visit date not found   Allergy Review of patient's allergies indicates no known allergies.  Current Outpatient Prescriptions  Medication Sig Dispense Refill  . amiodarone (PACERONE) 200 MG tablet Take 1 tablet (200 mg total) by mouth daily. In the morning 30 tablet 3  . atorvastatin (LIPITOR) 20 MG tablet Take 1 tablet (20 mg total) by mouth daily at 6 PM. 30 tablet 1  . lisinopril (PRINIVIL,ZESTRIL) 2.5 MG tablet Take 1 tablet (2.5 mg total) by mouth daily. In the evening 30 tablet 3  . spironolactone (ALDACTONE) 25 MG tablet Take 0.5 tablets (12.5 mg total) by mouth daily. 30 tablet 1  . apixaban (ELIQUIS) 5 MG TABS tablet Take 1 tablet (5 mg total) by mouth 2 (two) times daily. (Patient not taking: Reported on 08/30/2014) 60 tablet 1   No current facility-administered medications for this visit.    Discontinued Meds:   There are no discontinued medications.  Patient Active Problem List   Diagnosis Date Noted  . Chest tightness 08/13/2014  . Chest pain 08/13/2014  . Dizziness 08/13/2014  . Episodic lightheadedness   . NSVT (nonsustained ventricular tachycardia) 08/05/2014  . Congestive dilated cardiomyopathy 08/05/2014  . Syncope 08/05/2014  . Atrial  fibrillation with RVR 07/30/2014  . Secondary cardiomyopathy 06/04/2014  . Essential hypertension 05/02/2013    LABS    Component Value Date/Time   NA 141 08/13/2014 0352   NA 139 08/12/2014 2342   NA 137 08/05/2014 0454   K 3.2* 08/13/2014 0352   K 3.5 08/12/2014 2342   K 4.1 08/05/2014 0454   CL 107 08/13/2014 0352   CL 106 08/12/2014 2342   CL 108 08/05/2014 0454   CO2 27 08/13/2014 0352   CO2 26 08/12/2014 2342   CO2 25 08/05/2014 0454   GLUCOSE 110* 08/13/2014 0352   GLUCOSE 113* 08/12/2014 2342   GLUCOSE 97 08/05/2014 0454   BUN 13 08/13/2014 0352   BUN 13 08/12/2014 2342   BUN 18 08/05/2014 0454   CREATININE 1.18 08/13/2014 0352   CREATININE 1.16 08/12/2014 2342   CREATININE 1.29 08/05/2014 0454   CREATININE 0.96 05/21/2013 0951   CALCIUM 8.5 08/13/2014 0352   CALCIUM 8.7 08/12/2014 2342   CALCIUM 8.8 08/05/2014 0454   GFRNONAA 57* 08/13/2014 0352   GFRNONAA 58* 08/12/2014 2342   GFRNONAA 51* 08/05/2014 0454   GFRAA 66* 08/13/2014 0352   GFRAA 67* 08/12/2014 2342   GFRAA 59* 08/05/2014 0454   CMP     Component Value Date/Time   NA 141 08/13/2014  0352   K 3.2* 08/13/2014 0352   CL 107 08/13/2014 0352   CO2 27 08/13/2014 0352   GLUCOSE 110* 08/13/2014 0352   BUN 13 08/13/2014 0352   CREATININE 1.18 08/13/2014 0352   CREATININE 0.96 05/21/2013 0951   CALCIUM 8.5 08/13/2014 0352   PROT 6.3 08/13/2014 0352   ALBUMIN 3.4* 08/13/2014 0352   AST 16 08/13/2014 0352   ALT 12 08/13/2014 0352   ALKPHOS 56 08/13/2014 0352   BILITOT 0.5 08/13/2014 0352   GFRNONAA 57* 08/13/2014 0352   GFRAA 66* 08/13/2014 0352       Component Value Date/Time   WBC 7.6 08/13/2014 0352   WBC 8.1 08/12/2014 2342   WBC 7.8 08/06/2014 0521   HGB 12.6* 08/13/2014 0352   HGB 13.1 08/12/2014 2342   HGB 13.4 08/06/2014 0521   HCT 37.7* 08/13/2014 0352   HCT 39.8 08/12/2014 2342   HCT 40.3 08/06/2014 0521   MCV 95.0 08/13/2014 0352   MCV 94.3 08/12/2014 2342   MCV 93.5  08/06/2014 0521    Lipid Panel     Component Value Date/Time   CHOL 138 08/03/2014 0304   TRIG 73 08/03/2014 0304   HDL 33* 08/03/2014 0304   CHOLHDL 4.2 08/03/2014 0304   VLDL 15 08/03/2014 0304   LDLCALC 90 08/03/2014 0304    ABG No results found for: PHART, PCO2ART, PO2ART, HCO3, TCO2, ACIDBASEDEF, O2SAT   Lab Results  Component Value Date   TSH 6.813* 08/13/2014   BNP (last 3 results)  Recent Labs  08/02/14 0255  BNP 285.1*    ProBNP (last 3 results) No results for input(s): PROBNP in the last 8760 hours.  Cardiac Panel (last 3 results) No results for input(s): CKTOTAL, CKMB, TROPONINI, RELINDX in the last 72 hours.  Iron/TIBC/Ferritin/ %Sat No results found for: IRON, TIBC, FERRITIN, IRONPCTSAT   EKG Orders placed or performed during the hospital encounter of 08/12/14  . EKG 12-Lead  . EKG 12-Lead  . EKG     Prior Assessment and Plan Problem List as of 09/02/2014      Cardiovascular and Mediastinum   Essential hypertension   Last Assessment & Plan 06/04/2014 Office Visit Written 06/04/2014  1:34 PM by Satira Sark, MD    Based on history. Blood pressure is normal today.      Secondary cardiomyopathy   Last Assessment & Plan 06/04/2014 Office Visit Written 06/04/2014  1:34 PM by Satira Sark, MD    Documented by previous testing, LVEF 40-45% as of January 2014. Currently he is not taking any medications, has preferred a conservative approach. He is symptomatically stable at this time. I have suggested that we follow up with an echocardiogram and then make further decisions about medication recommendations.      Atrial fibrillation with RVR   NSVT (nonsustained ventricular tachycardia)   Congestive dilated cardiomyopathy   Syncope     Other   Chest tightness   Chest pain   Dizziness   Episodic lightheadedness       Imaging: Dg Chest 2 View  08/13/2014   CLINICAL DATA:  Chest tightness and lightheadedness  EXAM: CHEST  2 VIEW  COMPARISON:   07/20/2014  FINDINGS: The heart size and mediastinal contours are within normal limits. Both lungs are clear. The visualized skeletal structures are unremarkable.  IMPRESSION: No active cardiopulmonary disease.   Electronically Signed   By: Andreas Newport M.D.   On: 08/13/2014 00:00

## 2014-09-03 ENCOUNTER — Telehealth: Payer: Self-pay | Admitting: *Deleted

## 2014-09-03 ENCOUNTER — Other Ambulatory Visit: Payer: Self-pay

## 2014-09-03 ENCOUNTER — Encounter: Payer: Self-pay | Admitting: Nurse Practitioner

## 2014-09-03 ENCOUNTER — Ambulatory Visit (INDEPENDENT_AMBULATORY_CARE_PROVIDER_SITE_OTHER): Payer: Medicare Other | Admitting: Nurse Practitioner

## 2014-09-03 VITALS — BP 125/86 | HR 74 | Temp 97.6°F | Ht 66.0 in | Wt 179.6 lb

## 2014-09-03 DIAGNOSIS — I255 Ischemic cardiomyopathy: Secondary | ICD-10-CM

## 2014-09-03 DIAGNOSIS — K625 Hemorrhage of anus and rectum: Secondary | ICD-10-CM | POA: Insufficient documentation

## 2014-09-03 DIAGNOSIS — Z7901 Long term (current) use of anticoagulants: Secondary | ICD-10-CM

## 2014-09-03 LAB — CBC
HEMATOCRIT: 40.1 % (ref 39.0–52.0)
Hemoglobin: 13.5 g/dL (ref 13.0–17.0)
MCH: 31.3 pg (ref 26.0–34.0)
MCHC: 33.7 g/dL (ref 30.0–36.0)
MCV: 93 fL (ref 78.0–100.0)
MPV: 11.3 fL (ref 8.6–12.4)
PLATELETS: 240 10*3/uL (ref 150–400)
RBC: 4.31 MIL/uL (ref 4.22–5.81)
RDW: 14.9 % (ref 11.5–15.5)
WBC: 7.9 10*3/uL (ref 4.0–10.5)

## 2014-09-03 MED ORDER — PEG 3350-KCL-NA BICARB-NACL 420 G PO SOLR: 4000.0000 mL | Freq: Once | ORAL | Status: DC

## 2014-09-03 NOTE — Progress Notes (Addendum)
Primary Care Physician:  Glenda Chroman., MD Primary Gastroenterologist:  Dr. Gala Romney  Chief Complaint  Patient presents with  . GI Bleeding    HPI:   81 year old male referred here for an urgent work in visit by cardiology for melena on Eliquis anticoagulation. I spoke with a cardiology nurse practitioner yesterday and reviewed the case. Patient was seen today in their office with complaints of rectal bleeding of approximately a teaspoon of bright red blood with bowel movements as well as some "old blood" and a string attached with stool. Also admits some painful bowel movements, otherwise he is asymptomatic. He is on Eliquis for high risk stroke and a CHADS VASC score of 6 as well as atrial fibrillation. Most recent heart catheterization in March 2016 showed mild diffuse LAD disease, 50% diagonal stenosis, 60-70% ostial circumflex stenosis, 60% ramus intermedius stenosis, and a large aneurysmal RCA sluggish flow. Per my conversation with the cardiology nurse practitioner if the patient would need a procedure he is cleared to do so. Has had multiple colonoscopies and thinks the last one was at [facility] more than 10 years ago. He thinks he hasn't had one since retiring 20 years ago. States he has had some polyps removed but cannot remember the results. CBC done yesterday was normal with H/H 13.5/40.1.  Today he states his bleeding started about 1.5 weeks ago with scant tissue hematochezia. He notified his cardiologist who had him hold the Eliquis. He has not taken Eliquis in 6 days. Bleeding has progressed some to streaks on his stool and some blood in the commode. Denies lightheadedness, dizziness, chest pain, shortness of breath. Denies abdominal pain. Has a bowel movement every day which seems to be formed and soft. Admits some painful bowel movements and more toward constipation since starting Eliquis in early March of this year. Has a history of hemorrhoids but hasn't had a problem in a while.  Denies any other current upper or lower GI symptoms.  Past Medical History  Diagnosis Date  . Essential hypertension   . Secondary cardiomyopathy     LVEF 40-45% January 2014  . Atrial fibrillation   . Congestive dilated cardiomyopathy   . NSVT (nonsustained ventricular tachycardia)   . Syncope     Past Surgical History  Procedure Laterality Date  . Laparoscopic appendectomy  November 2015    Dr. [name]  . Left heart catheterization with coronary angiogram N/A 07/31/2014    Procedure: LEFT HEART CATHETERIZATION WITH CORONARY ANGIOGRAM;  Surgeon: [name], MD;  Location: [place] CATH LAB;  Service: Cardiovascular;  Laterality: N/A;  . Electrophysiology study N/A 08/05/2014    Procedure: ELECTROPHYSIOLOGY STUDY;  Surgeon: Deboraha Sprang, MD;  Location: [facility] CATH LAB;  Service: Cardiovascular;  Laterality: N/A;    Current Outpatient Prescriptions  Medication Sig Dispense Refill  . atorvastatin (LIPITOR) 20 MG tablet Take 1 tablet (20 mg total) by mouth daily at 6 PM. 30 tablet 1  . lisinopril (PRINIVIL,ZESTRIL) 2.5 MG tablet Take 1 tablet (2.5 mg total) by mouth daily. In the evening 30 tablet 3  . spironolactone (ALDACTONE) 25 MG tablet Take 0.5 tablets (12.5 mg total) by mouth daily. 30 tablet 1  . amiodarone (PACERONE) 200 MG tablet Take 1 tablet (200 mg total) by mouth daily. In the morning 30 tablet 3  . apixaban (ELIQUIS) 5 MG TABS tablet Take 1 tablet (5 mg total) by mouth 2 (two) times daily. (Patient not taking: Reported on 08/30/2014) 60 tablet 1   No current  facility-administered medications for this visit.    Allergies as of 09/03/2014  . (No Known Allergies)    Family History  Problem Relation Age of Onset  . Heart disease Father     Diagnosed in his 38s    History   Social History  . Marital Status: Married    Spouse Name: N/A  . Number of Children: N/A  . Years of Education: N/A   Occupational History  . Retired    Social History Main Topics  . Smoking  status: Never Smoker   . Smokeless tobacco: Never Used  . Alcohol Use: No  . Drug Use: No  . Sexual Activity: Not on file   Other Topics Concern  . Not on file   Social History Narrative    Review of Systems: General: Negative for anorexia, weight loss, fever, chills, fatigue, weakness. Eyes: Negative for vision changes.  ENT: Negative for difficulty swallowing. CV: Negative for chest pain, angina, palpitations, peripheral edema.  Respiratory: Negative for dyspnea at rest, dyspnea on exertion, cough, wheezing.  GI: See history of present illness. MS: Negative for joint pain, low back pain.  Derm: Negative for rash or itching.  Neuro: Negative for weakness, seizure, memory loss, confusion.  Psych: Negative for anxiety, depression.  Endo: Negative for unusual weight change.  Heme: Negative for bruising or bleeding. Allergy: Negative for rash or hives.   Physical Exam: BP 125/86 mmHg  Pulse 74  Temp(Src) 97.6 F (36.4 C) (Oral)  Ht 5\' 6"  (1.676 m)  Wt 179 lb 9.6 oz (81.466 kg)  BMI 29.00 kg/m2 General:   Alert and oriented. Pleasant and cooperative. Well-nourished and well-developed.  Head:  Normocephalic and atraumatic. Eyes:  Without icterus, sclera clear and conjunctiva pink.  Ears:  Normal auditory acuity. Neck:  Supple, without mass or thyromegaly. Lungs:  Clear to auscultation bilaterally. No wheezes, rales, or rhonchi. No distress.  Heart:  S1, S2 present without murmurs appreciated. Occasional PACs noted.  Abdomen:  +BS, soft, non-tender and non-distended. No HSM noted. No guarding or rebound. No masses appreciated.  Rectal:  Deferred  Pulses:  Normal pulses noted. Extremities:  Without clubbing or edema. Neurologic:  Alert and  oriented x4;  grossly normal neurologically. Skin:  Intact without significant lesions or rashes. Psych:  Alert and cooperative. Normal mood and affect.     09/03/2014 11:30 AM

## 2014-09-03 NOTE — Telephone Encounter (Signed)
-----   Message from Arnoldo Lenis, MD sent at 09/03/2014 12:32 PM EDT ----- Labs look good, despite recent bleeding his blood counts remain normal. Continue plans per 09/02/14 NP [place] clinic note

## 2014-09-03 NOTE — Assessment & Plan Note (Signed)
81 year old male patient with new onset rectal bleeding for the past week and a half in the setting of Eliquis anticoagulation. Was seen on request by cardiology. They have had him hold his Eliquis for the past 6 days until they can figure out where the bleeding was coming from. Last colonoscopy likely 10-20 years ago at [facility], we'll request those records from [place] to try to establish last procedure date as well as any results. CBC done yesterday is normal, no symptoms such as lightheadedness, dizziness, chest pain, shortness of breath. Denies any other red flag/warning signs or symptoms such as unintentional weight loss, change in appetite, change of bowel habits, and others. This point will proceed with a colonoscopy to identify source of rectal bleeding so that an informed decision may be made by cardiology on the risks and benefits of Eliquis anticoagulation. Likely benign anorectal source however cannot rule out more insidious process such as diverticular bleed, polyp bleed, or carcinoma.  Proceed with TCS with Dr. Gala Romney in near future: the risks, benefits, and alternatives have been discussed with the patient in detail. The patient states understanding and desires to proceed.  The patient is on anticoagulation including Eliquis, however he has not taken this in 6 days. No diabetes medications.

## 2014-09-03 NOTE — Assessment & Plan Note (Signed)
Patient has been on Eliquis anticoagulation since early March of this year for CHADDS VASC score of 6, atrial fibrillation, high stroke risk. The past week and a half has noted some rectal bleeding which has become increasing in amount. Initially started a scant tissue hematochezia however now is having streaking on stool and one to 2 teaspoons of blood in the commode. Has a history of hemorrhoids however he has not had a problem with these in many years. Need for workup to determine etiology of the rectal bleeding so that cardiology may make an informed decision about the risks and benefits of his anticoagulation. Likely benign anorectal source however cannot rule out more insidious process such as diverticular bleed, polyp bleed, or carcinoma. We will proceed with a colonoscopy at this point to further evaluate.  Proceed with TCS with Dr. Gala Romney in near future: the risks, benefits, and alternatives have been discussed with the patient in detail. The patient states understanding and desires to proceed.  The patient is on anticoagulation including Eliquis, however he has not taken this in 6 days. No diabetes medications.

## 2014-09-03 NOTE — Patient Instructions (Signed)
1. We will schedule your procedure for you (colonoscopy) 2. Further recommendations to be based on the results of your procedure.

## 2014-09-03 NOTE — Telephone Encounter (Signed)
Patient informed. 

## 2014-09-03 NOTE — Progress Notes (Signed)
cc'ed to pcp °

## 2014-09-04 ENCOUNTER — Ambulatory Visit (INDEPENDENT_AMBULATORY_CARE_PROVIDER_SITE_OTHER): Payer: Medicare Other | Admitting: *Deleted

## 2014-09-04 DIAGNOSIS — R55 Syncope and collapse: Secondary | ICD-10-CM | POA: Diagnosis not present

## 2014-09-05 ENCOUNTER — Encounter (HOSPITAL_COMMUNITY)
Admission: RE | Disposition: A | Discharge status: Home / Self Care | Payer: Self-pay | Source: Ambulatory Visit | Attending: Internal Medicine

## 2014-09-05 ENCOUNTER — Ambulatory Visit (HOSPITAL_COMMUNITY)
Admission: RE | Admit: 2014-09-05 | Discharge: 2014-09-05 | Disposition: A | Discharge status: Home / Self Care | Payer: Medicare Other | Source: Ambulatory Visit | Attending: Internal Medicine | Admitting: Internal Medicine

## 2014-09-05 ENCOUNTER — Encounter (HOSPITAL_COMMUNITY): Payer: Self-pay | Admitting: *Deleted

## 2014-09-05 DIAGNOSIS — Z7901 Long term (current) use of anticoagulants: Secondary | ICD-10-CM | POA: Insufficient documentation

## 2014-09-05 DIAGNOSIS — K921 Melena: Secondary | ICD-10-CM | POA: Diagnosis not present

## 2014-09-05 DIAGNOSIS — I4891 Unspecified atrial fibrillation: Secondary | ICD-10-CM | POA: Insufficient documentation

## 2014-09-05 DIAGNOSIS — K625 Hemorrhage of anus and rectum: Secondary | ICD-10-CM

## 2014-09-05 DIAGNOSIS — K5730 Diverticulosis of large intestine without perforation or abscess without bleeding: Secondary | ICD-10-CM | POA: Diagnosis not present

## 2014-09-05 DIAGNOSIS — K649 Unspecified hemorrhoids: Secondary | ICD-10-CM

## 2014-09-05 DIAGNOSIS — Z8673 Personal history of transient ischemic attack (TIA), and cerebral infarction without residual deficits: Secondary | ICD-10-CM | POA: Insufficient documentation

## 2014-09-05 DIAGNOSIS — Z860100 Personal history of colon polyps, unspecified: Secondary | ICD-10-CM | POA: Insufficient documentation

## 2014-09-05 DIAGNOSIS — Z79899 Other long term (current) drug therapy: Secondary | ICD-10-CM | POA: Insufficient documentation

## 2014-09-05 DIAGNOSIS — K648 Other hemorrhoids: Secondary | ICD-10-CM | POA: Diagnosis not present

## 2014-09-05 DIAGNOSIS — I10 Essential (primary) hypertension: Secondary | ICD-10-CM | POA: Insufficient documentation

## 2014-09-05 DIAGNOSIS — D123 Benign neoplasm of transverse colon: Secondary | ICD-10-CM | POA: Insufficient documentation

## 2014-09-05 DIAGNOSIS — I2510 Atherosclerotic heart disease of native coronary artery without angina pectoris: Secondary | ICD-10-CM | POA: Diagnosis not present

## 2014-09-05 HISTORY — PX: COLONOSCOPY: SHX5424

## 2014-09-05 SURGERY — COLONOSCOPY
Anesthesia: Moderate Sedation

## 2014-09-05 MED ORDER — MEPERIDINE HCL 100 MG/ML IJ SOLN
INTRAMUSCULAR | Status: DC
Start: 2014-09-05 — End: 2014-09-05
  Filled 2014-09-05: qty 2

## 2014-09-05 MED ORDER — SIMETHICONE 40 MG/0.6ML PO SUSP
ORAL | Status: DC | PRN
  Administered 2014-09-05: 15:00:00

## 2014-09-05 MED ORDER — SODIUM CHLORIDE 0.9 % IV SOLN
INTRAVENOUS | Status: DC
  Administered 2014-09-05: 1000 mL via INTRAVENOUS
  Administered 2014-09-05: 15:00:00 via INTRAVENOUS

## 2014-09-05 MED ORDER — MIDAZOLAM HCL 5 MG/5ML IJ SOLN
INTRAMUSCULAR | Status: AC
  Filled 2014-09-05: qty 10

## 2014-09-05 MED ORDER — MEPERIDINE HCL 100 MG/ML IJ SOLN
INTRAMUSCULAR | Status: DC | PRN
  Administered 2014-09-05: 50 mg via INTRAVENOUS

## 2014-09-05 MED ORDER — ONDANSETRON HCL 4 MG/2ML IJ SOLN
INTRAMUSCULAR | Status: AC
  Filled 2014-09-05: qty 2

## 2014-09-05 MED ORDER — ONDANSETRON HCL 4 MG/2ML IJ SOLN
INTRAMUSCULAR | Status: DC | PRN
  Administered 2014-09-05: 4 mg via INTRAVENOUS

## 2014-09-05 MED ORDER — MIDAZOLAM HCL 5 MG/5ML IJ SOLN
INTRAMUSCULAR | Status: DC | PRN
  Administered 2014-09-05: 2 mg via INTRAVENOUS
  Administered 2014-09-05: 1 mg via INTRAVENOUS

## 2014-09-05 NOTE — Interval H&P Note (Signed)
History and Physical Interval Note:  09/05/2014 2:41 PM  Alejandro [name]  has presented today for surgery, with the diagnosis of rectal bleeding  The various methods of treatment have been discussed with the patient and family. After consideration of risks, benefits and other options for treatment, the patient has consented to  Procedure(s) with comments: COLONOSCOPY (N/A) - 145pm as a surgical intervention .  The patient's history has been reviewed, patient examined, no change in status, stable for surgery.  I have reviewed the patient's chart and labs.  Questions were answered to the patient's satisfaction.     Alejandro [name]  No change. No anticoagulation times past 4 days. Diagnostic colonoscopy today per plan.The risks, benefits, limitations, alternatives and imponderables have been reviewed with the patient. Questions have been answered. All parties are agreeable.

## 2014-09-05 NOTE — Op Note (Signed)
[facility] [address] [place], [address]   COLONOSCOPY PROCEDURE REPORT  PATIENT: Alejandro [name], Alejandro [name]  MR#: [id number] BIRTHDATE: June 01, 1932 , 81  yrs. old GENDER: male ENDOSCOPIST: R.  Garfield Cornea, MD FACP [facility] REFERRED AO:ZHYQM Woody Seller, M.D.  Rozann Lesches, M.D. PROCEDURE DATE:  15-Sep-2014 PROCEDURE:   Ileo-colonoscopy, diagnostic and Colonoscopy with snare polypectomy INDICATIONS:hematochezia; normal H&H. MEDICATIONS: Versed 3 mg IV and Demerol 50 mg IV in divided doses. ASA CLASS:       Class III  CONSENT: The risks, benefits, alternatives and imponderables including but not limited to bleeding, perforation as well as the possibility of a missed lesion have been reviewed.  The potential for biopsy, lesion removal, etc. have also been discussed. Questions have been answered.  All parties agreeable.  Please see the history and physical in the medical record for more information.  DESCRIPTION OF PROCEDURE:   After the risks benefits and alternatives of the procedure were thoroughly explained, informed consent was obtained.  The digital rectal exam revealed no abnormalities of the rectum.   The [id number] ([id number])  endoscope was introduced through the anus and advanced to the terminal ileum which was intubated for a short distance. No adverse events experienced.   The quality of the prep was adequate  The instrument was then slowly withdrawn as the colon was fully examined.      COLON FINDINGS: Somewhat engorged internal hemorrhoids; otherwise, normal-appearing rectal mucosa.  Scattered left-sided diverticula; (1) 4 mm polyp at the splenic flexure; otherwise, remainder of the colonic mucosa appeared normal.  The distal 5 cm of terminal ileum mucosa also appeared normal.  The above-mentioned polyp was cold snare removed.  Retroflexion was performed. .  Withdrawal time=9 minutes 0 seconds.  The scope was withdrawn and the procedure  completed. COMPLICATIONS: There were no immediate complications.  ENDOSCOPIC IMPRESSION: Engorged hemorrhoids?"likely source of hematochezia. Colonic diverticulosis. Colonic  Polyp removed as described  RECOMMENDATIONS: Follow up on pathology. Begin Benefiber 2 teaspoons twice daily. Anusol HC suppositories twice a day. Resume Eliquis tomorrow.  eSigned:  R. Garfield Cornea, MD Rosalita Chessman [facility] 09/15/14 3:12 PM   cc:  CPT CODES: ICD CODES:  The ICD and CPT codes recommended by this software are interpretations from the data that the clinical staff has captured with the software.  The verification of the translation of this report to the ICD and CPT codes and modifiers is the sole responsibility of the health care institution and practicing physician where this report was generated.  [place]. will not be held responsible for the validity of the ICD and CPT codes included on this report.  AMA assumes no liability for data contained or not contained herein. CPT is a Designer, television/film set of the [employer].  PATIENT NAME:  Alejandro [name], Alejandro [name] MR#: [id number]

## 2014-09-05 NOTE — H&P (View-Only) (Signed)
Primary Care Physician:  Glenda Chroman., MD Primary Gastroenterologist:  Dr. Gala Romney  Chief Complaint  Patient presents with  . GI Bleeding    HPI:   81 year old male referred here for an urgent work in visit by cardiology for melena on Eliquis anticoagulation. I spoke with a cardiology nurse practitioner yesterday and reviewed the case. Patient was seen today in their office with complaints of rectal bleeding of approximately a teaspoon of bright red blood with bowel movements as well as some "old blood" and a string attached with stool. Also admits some painful bowel movements, otherwise he is asymptomatic. He is on Eliquis for high risk stroke and a CHADS VASC score of 6 as well as atrial fibrillation. Most recent heart catheterization in March 2016 showed mild diffuse LAD disease, 50% diagonal stenosis, 60-70% ostial circumflex stenosis, 60% ramus intermedius stenosis, and a large aneurysmal RCA sluggish flow. Per my conversation with the cardiology nurse practitioner if the patient would need a procedure he is cleared to do so. Has had multiple colonoscopies and thinks the last one was at [facility] more than 10 years ago. He thinks he hasn't had one since retiring 20 years ago. States he has had some polyps removed but cannot remember the results. CBC done yesterday was normal with H/H 13.5/40.1.  Today he states his bleeding started about 1.5 weeks ago with scant tissue hematochezia. He notified his cardiologist who had him hold the Eliquis. He has not taken Eliquis in 6 days. Bleeding has progressed some to streaks on his stool and some blood in the commode. Denies lightheadedness, dizziness, chest pain, shortness of breath. Denies abdominal pain. Has a bowel movement every day which seems to be formed and soft. Admits some painful bowel movements and more toward constipation since starting Eliquis in early March of this year. Has a history of hemorrhoids but hasn't had a problem in a while.  Denies any other current upper or lower GI symptoms.  Past Medical History  Diagnosis Date  . Essential hypertension   . Secondary cardiomyopathy     LVEF 40-45% January 2014  . Atrial fibrillation   . Congestive dilated cardiomyopathy   . NSVT (nonsustained ventricular tachycardia)   . Syncope     Past Surgical History  Procedure Laterality Date  . Laparoscopic appendectomy  November 2015    Dr. [name]  . Left heart catheterization with coronary angiogram N/A 07/31/2014    Procedure: LEFT HEART CATHETERIZATION WITH CORONARY ANGIOGRAM;  Surgeon: [name], MD;  Location: [facility] CATH LAB;  Service: Cardiovascular;  Laterality: N/A;  . Electrophysiology study N/A 08/05/2014    Procedure: ELECTROPHYSIOLOGY STUDY;  Surgeon: Deboraha Sprang, MD;  Location: [facility] CATH LAB;  Service: Cardiovascular;  Laterality: N/A;    Current Outpatient Prescriptions  Medication Sig Dispense Refill  . atorvastatin (LIPITOR) 20 MG tablet Take 1 tablet (20 mg total) by mouth daily at 6 PM. 30 tablet 1  . lisinopril (PRINIVIL,ZESTRIL) 2.5 MG tablet Take 1 tablet (2.5 mg total) by mouth daily. In the evening 30 tablet 3  . spironolactone (ALDACTONE) 25 MG tablet Take 0.5 tablets (12.5 mg total) by mouth daily. 30 tablet 1  . amiodarone (PACERONE) 200 MG tablet Take 1 tablet (200 mg total) by mouth daily. In the morning 30 tablet 3  . apixaban (ELIQUIS) 5 MG TABS tablet Take 1 tablet (5 mg total) by mouth 2 (two) times daily. (Patient not taking: Reported on 08/30/2014) 60 tablet 1   No current  facility-administered medications for this visit.    Allergies as of 09/03/2014  . (No Known Allergies)    Family History  Problem Relation Age of Onset  . Heart disease Father     Diagnosed in his 47s    History   Social History  . Marital Status: Married    Spouse Name: N/A  . Number of Children: N/A  . Years of Education: N/A   Occupational History  . Retired    Social History Main Topics  . Smoking  status: Never Smoker   . Smokeless tobacco: Never Used  . Alcohol Use: No  . Drug Use: No  . Sexual Activity: Not on file   Other Topics Concern  . Not on file   Social History Narrative    Review of Systems: General: Negative for anorexia, weight loss, fever, chills, fatigue, weakness. Eyes: Negative for vision changes.  ENT: Negative for difficulty swallowing. CV: Negative for chest pain, angina, palpitations, peripheral edema.  Respiratory: Negative for dyspnea at rest, dyspnea on exertion, cough, wheezing.  GI: See history of present illness. MS: Negative for joint pain, low back pain.  Derm: Negative for rash or itching.  Neuro: Negative for weakness, seizure, memory loss, confusion.  Psych: Negative for anxiety, depression.  Endo: Negative for unusual weight change.  Heme: Negative for bruising or bleeding. Allergy: Negative for rash or hives.   Physical Exam: BP 125/86 mmHg  Pulse 74  Temp(Src) 97.6 F (36.4 C) (Oral)  Ht 5\' 6"  (1.676 m)  Wt 179 lb 9.6 oz (81.466 kg)  BMI 29.00 kg/m2 General:   Alert and oriented. Pleasant and cooperative. Well-nourished and well-developed.  Head:  Normocephalic and atraumatic. Eyes:  Without icterus, sclera clear and conjunctiva pink.  Ears:  Normal auditory acuity. Neck:  Supple, without mass or thyromegaly. Lungs:  Clear to auscultation bilaterally. No wheezes, rales, or rhonchi. No distress.  Heart:  S1, S2 present without murmurs appreciated. Occasional PACs noted.  Abdomen:  +BS, soft, non-tender and non-distended. No HSM noted. No guarding or rebound. No masses appreciated.  Rectal:  Deferred  Pulses:  Normal pulses noted. Extremities:  Without clubbing or edema. Neurologic:  Alert and  oriented x4;  grossly normal neurologically. Skin:  Intact without significant lesions or rashes. Psych:  Alert and cooperative. Normal mood and affect.     09/03/2014 11:30 AM

## 2014-09-05 NOTE — Discharge Instructions (Signed)
Colonoscopy Discharge Instructions  Read the instructions outlined below and refer to this sheet in the next few weeks. These discharge instructions provide you with general information on caring for yourself after you leave the hospital. Your doctor may also give you specific instructions. While your treatment has been planned according to the most current medical practices available, unavoidable complications occasionally occur. If you have any problems or questions after discharge, call Dr. Gala Romney at [phone]. ACTIVITY  You may resume your regular activity, but move at a slower pace for the next 24 hours.   Take frequent rest periods for the next 24 hours.   Walking will help get rid of the air and reduce the bloated feeling in your belly (abdomen).   No driving for 24 hours (because of the medicine (anesthesia) used during the test).    Do not sign any important legal documents or operate any machinery for 24 hours (because of the anesthesia used during the test).  NUTRITION  Drink plenty of fluids.   You may resume your normal diet as instructed by your doctor.   Begin with a light meal and progress to your normal diet. Heavy or fried foods are harder to digest and may make you feel sick to your stomach (nauseated).   Avoid alcoholic beverages for 24 hours or as instructed.  MEDICATIONS  You may resume your normal medications unless your doctor tells you otherwise.  WHAT YOU CAN EXPECT TODAY  Some feelings of bloating in the abdomen.   Passage of more gas than usual.   Spotting of blood in your stool or on the toilet paper.  IF YOU HAD POLYPS REMOVED DURING THE COLONOSCOPY:  No aspirin products for 7 days or as instructed.   No alcohol for 7 days or as instructed.   Eat a soft diet for the next 24 hours.  FINDING OUT THE RESULTS OF YOUR TEST Not all test results are available during your visit. If your test results are not back during the visit, make an appointment  with your caregiver to find out the results. Do not assume everything is normal if you have not heard from your caregiver or the medical facility. It is important for you to follow up on all of your test results.  SEEK IMMEDIATE MEDICAL ATTENTION IF:  You have more than a spotting of blood in your stool.   Your belly is swollen (abdominal distention).   You are nauseated or vomiting.   You have a temperature over 101.   You have abdominal pain or discomfort that is severe or gets worse throughout the day.    Diverticulosis, polyp and hemorrhoid information provided  Begin Benefiber 2 teaspoons twice daily  Anusol HC suppositories 1 per rectum twice daily 10 days  Resume Eliquis tomorow  Further recommendations to follow pending review of pathology report   Colon Polyps Polyps are lumps of extra tissue growing inside the body. Polyps can grow in the large intestine (colon). Most colon polyps are noncancerous (benign). However, some colon polyps can become cancerous over time. Polyps that are larger than a pea may be harmful. To be safe, caregivers remove and test all polyps. CAUSES  Polyps form when mutations in the genes cause your cells to grow and divide even though no more tissue is needed. RISK FACTORS There are a number of risk factors that can increase your chances of getting colon polyps. They include:  Being older than 50 years.  Family history of colon polyps or  colon cancer.  Long-term colon diseases, such as colitis or Crohn disease.  Being overweight.  Smoking.  Being inactive.  Drinking too much alcohol. SYMPTOMS  Most small polyps do not cause symptoms. If symptoms are present, they may include:  Blood in the stool. The stool may look dark red or black.  Constipation or diarrhea that lasts longer than 1 week. DIAGNOSIS People often do not know they have polyps until their caregiver finds them during a regular checkup. Your caregiver can use 4 tests  to check for polyps:  Digital rectal exam. The caregiver wears gloves and feels inside the rectum. This test would find polyps only in the rectum.  Barium enema. The caregiver puts a liquid called barium into your rectum before taking X-rays of your colon. Barium makes your colon look white. Polyps are dark, so they are easy to see in the X-ray pictures.  Sigmoidoscopy. A thin, flexible tube (sigmoidoscope) is placed into your rectum. The sigmoidoscope has a light and tiny camera in it. The caregiver uses the sigmoidoscope to look at the last third of your colon.  Colonoscopy. This test is like sigmoidoscopy, but the caregiver looks at the entire colon. This is the most common method for finding and removing polyps. TREATMENT  Any polyps will be removed during a sigmoidoscopy or colonoscopy. The polyps are then tested for cancer. PREVENTION  To help lower your risk of getting more colon polyps:  Eat plenty of fruits and vegetables. Avoid eating fatty foods.  Do not smoke.  Avoid drinking alcohol.  Exercise every day.  Lose weight if recommended by your caregiver.  Eat plenty of calcium and folate. Foods that are rich in calcium include milk, cheese, and broccoli. Foods that are rich in folate include chickpeas, kidney beans, and spinach. HOME CARE INSTRUCTIONS Keep all follow-up appointments as directed by your caregiver. You may need periodic exams to check for polyps. SEEK MEDICAL CARE IF: You notice bleeding during a bowel movement. Document Released: 02/11/2004 Document Revised: 08/09/2011 Document Reviewed: 07/27/2011 [facility] Patient Information 2015 [place], [place]. This information is not intended to replace advice given to you by your health care provider. Make sure you discuss any questions you have with your health care provider.   Hemorrhoids Hemorrhoids are swollen veins around the rectum or anus. There are two types of hemorrhoids:   Internal hemorrhoids. These occur  in the veins just inside the rectum. They may poke through to the outside and become irritated and painful.  External hemorrhoids. These occur in the veins outside the anus and can be felt as a painful swelling or hard lump near the anus. CAUSES  Pregnancy.   Obesity.   Constipation or diarrhea.   Straining to have a bowel movement.   Sitting for long periods on the toilet.  Heavy lifting or other activity that caused you to strain.  Anal intercourse. SYMPTOMS   Pain.   Anal itching or irritation.   Rectal bleeding.   Fecal leakage.   Anal swelling.   One or more lumps around the anus.  DIAGNOSIS  Your caregiver may be able to diagnose hemorrhoids by visual examination. Other examinations or tests that may be performed include:   Examination of the rectal area with a gloved hand (digital rectal exam).   Examination of anal canal using a small tube (scope).   A blood test if you have lost a significant amount of blood.  A test to look inside the colon (sigmoidoscopy or colonoscopy). TREATMENT Most  hemorrhoids can be treated at home. However, if symptoms do not seem to be getting better or if you have a lot of rectal bleeding, your caregiver may perform a procedure to help make the hemorrhoids get smaller or remove them completely. Possible treatments include:   Placing a rubber band at the base of the hemorrhoid to cut off the circulation (rubber band ligation).   Injecting a chemical to shrink the hemorrhoid (sclerotherapy).   Using a tool to burn the hemorrhoid (infrared light therapy).   Surgically removing the hemorrhoid (hemorrhoidectomy).   Stapling the hemorrhoid to block blood flow to the tissue (hemorrhoid stapling).  HOME CARE INSTRUCTIONS   Eat foods with fiber, such as whole grains, beans, nuts, fruits, and vegetables. Ask your doctor about taking products with added fiber in them (fibersupplements).  Increase fluid intake. Drink  enough water and fluids to keep your urine clear or pale yellow.   Exercise regularly.   Go to the bathroom when you have the urge to have a bowel movement. Do not wait.   Avoid straining to have bowel movements.   Keep the anal area dry and clean. Use wet toilet paper or moist towelettes after a bowel movement.   Medicated creams and suppositories may be used or applied as directed.   Only take over-the-counter or prescription medicines as directed by your caregiver.   Take warm sitz baths for 15-20 minutes, 3-4 times a day to ease pain and discomfort.   Place ice packs on the hemorrhoids if they are tender and swollen. Using ice packs between sitz baths may be helpful.   Put ice in a plastic bag.   Place a towel between your skin and the bag.   Leave the ice on for 15-20 minutes, 3-4 times a day.   Do not use a donut-shaped pillow or sit on the toilet for long periods. This increases blood pooling and pain.  SEEK MEDICAL CARE IF:  You have increasing pain and swelling that is not controlled by treatment or medicine.  You have uncontrolled bleeding.  You have difficulty or you are unable to have a bowel movement.  You have pain or inflammation outside the area of the hemorrhoids. MAKE SURE YOU:  Understand these instructions.  Will watch your condition.  Will get help right away if you are not doing well or get worse. Document Released: 05/14/2000 Document Revised: 05/03/2012 Document Reviewed: 03/21/2012 [facility] Patient Information 2015 [place], [place]. This information is not intended to replace advice given to you by your health care provider. Make sure you discuss any questions you have with your health care provider.   Diverticulosis Diverticulosis is the condition that develops when small pouches (diverticula) form in the wall of your colon. Your colon, or large intestine, is where water is absorbed and stool is formed. The pouches form when the  inside layer of your colon pushes through weak spots in the outer layers of your colon. CAUSES  No one knows exactly what causes diverticulosis. RISK FACTORS  Being older than 30. Your risk for this condition increases with age. Diverticulosis is rare in people younger than 40 years. By age 19, almost everyone has it.  Eating a low-fiber diet.  Being frequently constipated.  Being overweight.  Not getting enough exercise.  Smoking.  Taking over-the-counter pain medicines, like aspirin and ibuprofen. SYMPTOMS  Most people with diverticulosis do not have symptoms. DIAGNOSIS  Because diverticulosis often has no symptoms, health care providers often discover the condition during  an exam for other colon problems. In many cases, a health care provider will diagnose diverticulosis while using a flexible scope to examine the colon (colonoscopy). TREATMENT  If you have never developed an infection related to diverticulosis, you may not need treatment. If you have had an infection before, treatment may include:  Eating more fruits, vegetables, and grains.  Taking a fiber supplement.  Taking a live bacteria supplement (probiotic).  Taking medicine to relax your colon. HOME CARE INSTRUCTIONS   Drink at least 6-8 glasses of water each day to prevent constipation.  Try not to strain when you have a bowel movement.  Keep all follow-up appointments. If you have had an infection before:  Increase the fiber in your diet as directed by your health care provider or dietitian.  Take a dietary fiber supplement if your health care provider approves.  Only take medicines as directed by your health care provider. SEEK MEDICAL CARE IF:   You have abdominal pain.  You have bloating.  You have cramps.  You have not gone to the bathroom in 3 days. SEEK IMMEDIATE MEDICAL CARE IF:   Your pain gets worse.  Yourbloating becomes very bad.  You have a fever or chills, and your symptoms  suddenly get worse.  You begin vomiting.  You have bowel movements that are bloody or black. MAKE SURE YOU:  Understand these instructions.  Will watch your condition.  Will get help right away if you are not doing well or get worse. Document Released: 02/12/2004 Document Revised: 05/22/2013 Document Reviewed: 04/11/2013 [facility] Patient Information 2015 [place], [place]. This information is not intended to replace advice given to you by your health care provider. Make sure you discuss any questions you have with your health care provider.

## 2014-09-06 ENCOUNTER — Encounter (HOSPITAL_COMMUNITY): Payer: Self-pay | Admitting: Internal Medicine

## 2014-09-09 ENCOUNTER — Encounter: Payer: Self-pay | Admitting: Internal Medicine

## 2014-09-09 NOTE — Progress Notes (Signed)
Loop recorder 

## 2014-09-18 ENCOUNTER — Encounter: Payer: Self-pay | Admitting: Cardiology

## 2014-09-18 ENCOUNTER — Ambulatory Visit (INDEPENDENT_AMBULATORY_CARE_PROVIDER_SITE_OTHER): Payer: Medicare Other | Admitting: Cardiology

## 2014-09-18 VITALS — BP 88/60 | HR 74 | Ht 68.0 in | Wt 181.0 lb

## 2014-09-18 DIAGNOSIS — I472 Ventricular tachycardia: Secondary | ICD-10-CM | POA: Diagnosis not present

## 2014-09-18 DIAGNOSIS — I2510 Atherosclerotic heart disease of native coronary artery without angina pectoris: Secondary | ICD-10-CM

## 2014-09-18 DIAGNOSIS — I429 Cardiomyopathy, unspecified: Secondary | ICD-10-CM

## 2014-09-18 DIAGNOSIS — I480 Paroxysmal atrial fibrillation: Secondary | ICD-10-CM

## 2014-09-18 DIAGNOSIS — I255 Ischemic cardiomyopathy: Secondary | ICD-10-CM

## 2014-09-18 DIAGNOSIS — I4729 Other ventricular tachycardia: Secondary | ICD-10-CM

## 2014-09-18 NOTE — Patient Instructions (Signed)
Your physician recommends that you continue on your current medications as directed. Please refer to the Current Medication list given to you today. Your physician recommends that you schedule a follow-up appointment in: 3 months. You will receive a reminder letter in the mail in about 1-2 months reminding you to call and schedule your appointment. If you don't receive this letter, please contact our office. 

## 2014-09-18 NOTE — Progress Notes (Signed)
Cardiology Office Note  Date: 09/18/2014   ID: Alejandro [name], DOB 08/27/1932, MRN [id number]  PCP: Glenda Chroman., MD  Primary Cardiologist: Rozann Lesches, MD   Chief Complaint  Patient presents with  . Cardiomyopathy  . Atrial Fibrillation  . Coronary Artery Disease    History of Present Illness: Alejandro [name] is a 81 y.o. male most recently seen in the office by Ms. Lawrence NP in early April. His recent cardiac history is outlined extensively in my prior note from March. At the most recent visit, he was complaining of hematochezia and some pain with bowel movements. Eliquis was held and he was referred for gastroenterology evaluation. He underwent colonoscopy with Dr. Gala Romney on April 7 and was found to have engorged hemorrhoids that were felt to be the likely source of his hematochezia. Also colonic diverticulosis and a polyp that was removed. He was started back on Eliquis.  He presents today for a follow-up visit, states that he has had no further hematochezia. He reports compliance with his medications. Blood pressure remains low, although he reports being completely asymptomatic, no dizziness or syncope. He remains active, working outdoors, reports no chest pain or palpitations.  He did have follow-up lab work outlined below, thyroid studies were normal.   Past Medical History  Diagnosis Date  . Essential hypertension   . Secondary cardiomyopathy     LVEF 40-45% January 2014  . Paroxysmal atrial fibrillation   . Congestive dilated cardiomyopathy   . NSVT (nonsustained ventricular tachycardia)     Negative EP study  . Syncope   . CAD (coronary artery disease)     Past Surgical History  Procedure Laterality Date  . Laparoscopic appendectomy  November 2015    Dr. [name]  . Left heart catheterization with coronary angiogram N/A 07/31/2014    Procedure: LEFT HEART CATHETERIZATION WITH CORONARY ANGIOGRAM;  Surgeon: [name], MD;  Location: [facility] CATH LAB;  Service:  Cardiovascular;  Laterality: N/A;  . Electrophysiology study N/A 08/05/2014    Procedure: ELECTROPHYSIOLOGY STUDY;  Surgeon: Deboraha Sprang, MD;  Location: [facility] CATH LAB;  Service: Cardiovascular;  Laterality: N/A;  . Colonoscopy  unknown  . Colonoscopy N/A 09/05/2014    Procedure: COLONOSCOPY;  Surgeon: [name], MD;  [facility];  Service: Endoscopy;  Laterality: N/A;  145pm    Current Outpatient Prescriptions  Medication Sig Dispense Refill  . acetaminophen (TYLENOL) 500 MG tablet Take 500-1,000 mg by mouth every 6 (six) hours as needed for mild pain.    Marland Kitchen amiodarone (PACERONE) 200 MG tablet Take 1 tablet (200 mg total) by mouth daily. In the morning 30 tablet 3  . apixaban (ELIQUIS) 5 MG TABS tablet Take 1 tablet (5 mg total) by mouth 2 (two) times daily. 60 tablet 1  . atorvastatin (LIPITOR) 20 MG tablet Take 1 tablet (20 mg total) by mouth daily at 6 PM. 30 tablet 1  . hydrocortisone (ANUSOL-HC) 25 MG suppository Place 25 mg rectally 2 (two) times daily.    Marland Kitchen lisinopril (PRINIVIL,ZESTRIL) 2.5 MG tablet Take 1 tablet (2.5 mg total) by mouth daily. In the evening 30 tablet 3  . polyethylene glycol-electrolytes (NULYTELY/GOLYTELY) 420 G solution Take 4,000 mLs by mouth once. 4000 mL 0  . spironolactone (ALDACTONE) 25 MG tablet Take 0.5 tablets (12.5 mg total) by mouth daily. 30 tablet 1   No current facility-administered medications for this visit.    Allergies:  Review of patient's allergies indicates no known allergies.  Social History: The patient  reports that he has never smoked. He has never used smokeless tobacco. He reports that he does not drink alcohol or use illicit drugs.   ROS:  Please see the history of present illness. Otherwise, complete review of systems is positive for none.  All other systems are reviewed and negative.   Physical Exam: VS:  BP 88/60 mmHg  Pulse 74  Ht 5\' 8"  (1.727 m)  Wt 181 lb (82.101 kg)  BMI 27.53 kg/m2  SpO2 94%, BMI Body mass  index is 27.53 kg/(m^2).  Wt Readings from Last 3 Encounters:  09/18/14 181 lb (82.101 kg)  09/05/14 180 lb (81.647 kg)  09/03/14 179 lb 9.6 oz (81.466 kg)     General: Patient appears comfortable at rest. HEENT: Conjunctiva and lids normal, oropharynx clear with moist mucosa. Neck: Supple, no elevated JVP or carotid bruits, no thyromegaly. Lungs: Clear to auscultation, nonlabored breathing at rest. Cardiac: Regular rate and rhythm, indistinct PMI, no S3 or significant systolic murmur. Abdomen: Soft, nontender, bowel sounds present, no guarding or rebound. Extremities: No pitting edema, distal pulses 2+. Skin: Warm and dry. Musculoskeletal: No kyphosis. Neuropsychiatric: Alert and oriented x3, affect grossly appropriate.   ECG: ECG is not ordered today.   Recent Labwork: 08/02/2014: B Natriuretic Peptide 285.1* 08/13/2014: ALT 12; AST 16; BUN 13; Creatinine 1.18; Magnesium 1.8; Potassium 3.2*; Sodium 141; TSH 6.813* 09/02/2014: Hemoglobin 13.5; Platelets 240     Component Value Date/Time   CHOL 138 08/03/2014 0304   TRIG 73 08/03/2014 0304   HDL 33* 08/03/2014 0304   CHOLHDL 4.2 08/03/2014 0304   VLDL 15 08/03/2014 0304   LDLCALC 90 08/03/2014 0304   09/16/2014: TSH 2.15, free T4 0.83   Assessment and Plan:  1. Paroxysmal atrial fibrillation. Plan is to continue Eliquis and amiodarone, hopefully with more consistent rhythm control he will see improvement in LVEF. Recent thyroid studies were normal.  2. History of hematochezia with hemorrhoids documented at recent colonoscopy. Patient is now having no further bleeding, having resumed Eliquis.  3. CAD, symptomatically stable without angina.  4. Cardiomyopathy, probable mixed etiology and not purely ischemic. LVEF was low normal by echocardiogram earlier in the year, then down in the range of 30-35% at subsequent angiography in the more acute setting. We will plan to continue medical therapy and follow-up with an echocardiogram  around the time of his next visit.  5. Intermittent PVCs, previously.documented NSVT, negative EP study.  Current medicines were reviewed with the patient today.   Disposition: FU with me in 3 months.   Signed, Satira Sark, MD, [facility] 09/18/2014 9:44 AM    [facility] [place], [place], [place] [address] Phone: [phone]; Fax: [phone]

## 2014-09-27 ENCOUNTER — Telehealth: Payer: Self-pay | Admitting: *Deleted

## 2014-09-27 ENCOUNTER — Other Ambulatory Visit: Payer: Self-pay | Admitting: *Deleted

## 2014-09-27 MED ORDER — ATORVASTATIN CALCIUM 20 MG PO TABS: 20.0000 mg | ORAL_TABLET | Freq: Every day | ORAL | Status: DC

## 2014-09-27 MED ORDER — LISINOPRIL 2.5 MG PO TABS: 2.5000 mg | ORAL_TABLET | Freq: Every day | ORAL | Status: DC

## 2014-09-27 NOTE — Telephone Encounter (Signed)
-----   Message from Satira Sark, MD sent at 09/18/2014 10:28 AM EDT ----- Reviewed. Normal TSH and T4. Continue current regimen.

## 2014-09-27 NOTE — Telephone Encounter (Signed)
Patient informed. 

## 2014-10-04 ENCOUNTER — Ambulatory Visit (INDEPENDENT_AMBULATORY_CARE_PROVIDER_SITE_OTHER): Payer: Medicare Other | Admitting: *Deleted

## 2014-10-04 DIAGNOSIS — R55 Syncope and collapse: Secondary | ICD-10-CM | POA: Diagnosis not present

## 2014-10-04 LAB — CUP PACEART REMOTE DEVICE CHECK
Date Time Interrogation Session: 20160307212248
MDC IDC SET ZONE DETECTION INTERVAL: 2000 ms
MDC IDC SET ZONE DETECTION INTERVAL: 3000 ms
MDC IDC SET ZONE DETECTION INTERVAL: 400 ms

## 2014-10-07 ENCOUNTER — Other Ambulatory Visit: Payer: Self-pay | Admitting: Nurse Practitioner

## 2014-10-11 NOTE — Progress Notes (Signed)
Loop recorder 

## 2014-10-16 ENCOUNTER — Other Ambulatory Visit: Payer: Self-pay | Admitting: Cardiology

## 2014-10-16 MED ORDER — APIXABAN 5 MG PO TABS: 5.0000 mg | ORAL_TABLET | Freq: Two times a day (BID) | ORAL | Status: DC

## 2014-10-16 NOTE — Telephone Encounter (Signed)
Patient is requesting a refill on ELIQUIS) [place]

## 2014-10-16 NOTE — Telephone Encounter (Signed)
Done

## 2014-10-29 LAB — CUP PACEART REMOTE DEVICE CHECK: Date Time Interrogation Session: 20160531165524

## 2014-11-04 ENCOUNTER — Ambulatory Visit (INDEPENDENT_AMBULATORY_CARE_PROVIDER_SITE_OTHER): Payer: Medicare Other | Admitting: *Deleted

## 2014-11-04 DIAGNOSIS — R55 Syncope and collapse: Secondary | ICD-10-CM | POA: Diagnosis not present

## 2014-11-06 ENCOUNTER — Encounter: Payer: Self-pay | Admitting: Internal Medicine

## 2014-11-06 ENCOUNTER — Ambulatory Visit (INDEPENDENT_AMBULATORY_CARE_PROVIDER_SITE_OTHER): Payer: Medicare Other | Admitting: Internal Medicine

## 2014-11-06 VITALS — BP 100/62 | HR 55 | Ht 68.0 in | Wt 179.6 lb

## 2014-11-06 DIAGNOSIS — I429 Cardiomyopathy, unspecified: Secondary | ICD-10-CM

## 2014-11-06 DIAGNOSIS — I4891 Unspecified atrial fibrillation: Secondary | ICD-10-CM | POA: Diagnosis not present

## 2014-11-06 DIAGNOSIS — I255 Ischemic cardiomyopathy: Secondary | ICD-10-CM

## 2014-11-06 DIAGNOSIS — I480 Paroxysmal atrial fibrillation: Secondary | ICD-10-CM

## 2014-11-06 DIAGNOSIS — R55 Syncope and collapse: Secondary | ICD-10-CM

## 2014-11-06 DIAGNOSIS — I10 Essential (primary) hypertension: Secondary | ICD-10-CM

## 2014-11-06 LAB — CUP PACEART INCLINIC DEVICE CHECK
Date Time Interrogation Session: 20160608112153
MDC IDC SET ZONE DETECTION INTERVAL: 2000 ms
Zone Setting Detection Interval: 3000 ms
Zone Setting Detection Interval: 400 ms

## 2014-11-06 MED ORDER — EDOXABAN TOSYLATE 60 MG PO TABS: 60.0000 mg | ORAL_TABLET | Freq: Every day | ORAL | Status: DC

## 2014-11-06 NOTE — Progress Notes (Signed)
Loop recorder 

## 2014-11-06 NOTE — Patient Instructions (Signed)
Medication Instructions:  Your physician has recommended you make the following change in your medication:  1) Stop Eliquis 2) Start Savaysa 60 mg daily    Labwork: None ordered  Testing/Procedures: None ordered  Follow-Up: Your physician recommends that you schedule a follow-up appointment as needed with Dr Rayann Heman and as scheduled with Dr Domenic Polite   Any Other Special Instructions Will Be Listed Below (If Applicable).

## 2014-11-07 ENCOUNTER — Other Ambulatory Visit: Payer: Self-pay | Admitting: Nurse Practitioner

## 2014-11-07 NOTE — Progress Notes (Signed)
Electrophysiology Office Note   Date:  11/07/2014   ID:  Alejandro [name], DOB 02-Jun-1932, MRN [id number]  PCP:  Glenda Chroman., MD  Cardiologist:  Dr Domenic Polite Primary Electrophysiologist: Thompson Grayer, MD    Chief Complaint  Patient presents with  . Atrial fibrillation with RVR and syncope     History of Present Illness: Alejandro [name] is a 81 y.o. male who presents today for electrophysiology evaluation.   He previously underwent EPS by Dr Caryl Comes for syncope which was negative.  He had an implantable loop recorder placed.  He has done well since, without any further syncoep.  He has atrial fibrillation.  He has been anticoagulated with eliquis.  He has no prescription coverage and therefore pays "too much" for eliquis.  He is otherwise without complaint today.   Today, he denies symptoms of palpitations, chest pain, shortness of breath, orthopnea, PND, lower extremity edema, claudication, dizziness, presyncope, syncope, bleeding, or neurologic sequela. The patient is tolerating medications without difficulties and is otherwise without complaint today.    Past Medical History  Diagnosis Date  . Essential hypertension   . Secondary cardiomyopathy     LVEF 40-45% January 2014  . Paroxysmal atrial fibrillation   . Congestive dilated cardiomyopathy   . NSVT (nonsustained ventricular tachycardia)     Negative EP study  . Syncope   . CAD (coronary artery disease)    Past Surgical History  Procedure Laterality Date  . Laparoscopic appendectomy  November 2015    Dr. [name]  . Left heart catheterization with coronary angiogram N/A 07/31/2014    Procedure: LEFT HEART CATHETERIZATION WITH CORONARY ANGIOGRAM;  Surgeon: [name], MD;  Location: [facility] CATH LAB;  Service: Cardiovascular;  Laterality: N/A;  . Electrophysiology study N/A 08/05/2014    Procedure: ELECTROPHYSIOLOGY STUDY;  Surgeon: Deboraha Sprang, MD;  Location: [facility] CATH LAB;  Service: Cardiovascular;  Laterality: N/A;  .  Colonoscopy  unknown  . Colonoscopy N/A 09/05/2014    Procedure: COLONOSCOPY;  Surgeon: [name], MD;  [facility];  Service: Endoscopy;  Laterality: N/A;  145pm     Current Outpatient Prescriptions  Medication Sig Dispense Refill  . acetaminophen (TYLENOL) 500 MG tablet Take 500-1,000 mg by mouth every 6 (six) hours as needed for mild pain.    Marland Kitchen amiodarone (PACERONE) 200 MG tablet Take 1 tablet (200 mg total) by mouth daily. In the morning 30 tablet 3  . atorvastatin (LIPITOR) 20 MG tablet Take 1 tablet (20 mg total) by mouth daily at 6 PM. 30 tablet 6  . hydrocortisone (ANUSOL-HC) 25 MG suppository Place 25 mg rectally 2 (two) times daily.    Marland Kitchen lisinopril (PRINIVIL,ZESTRIL) 2.5 MG tablet Take 1 tablet (2.5 mg total) by mouth daily. In the evening 30 tablet 6  . polyethylene glycol-electrolytes (NULYTELY/GOLYTELY) 420 G solution Take 4,000 mLs by mouth once. 4000 mL 0  . spironolactone (ALDACTONE) 25 MG tablet TAKE (1/2) TABLET BY MOUTH DAILY. 15 tablet 6  . edoxaban (SAVAYSA) 60 MG TABS tablet Take 60 mg by mouth daily. 30 tablet 11   No current facility-administered medications for this visit.    Allergies:   Review of patient's allergies indicates no known allergies.   Social History:  The patient  reports that he has never smoked. He has never used smokeless tobacco. He reports that he does not drink alcohol or use illicit drugs.   Family History:  The patient's  family history includes Alzheimer's disease in his  mother; Arthritis in his father; Heart disease in his father. There is no history of Colon cancer or Colon polyps.    ROS:  Please see the history of present illness.   All other systems are reviewed and negative.    PHYSICAL EXAM: VS:  BP 100/62 mmHg  Pulse 55  Ht 5\' 8"  (1.727 m)  Wt 81.466 kg (179 lb 9.6 oz)  BMI 27.31 kg/m2 , BMI Body mass index is 27.31 kg/(m^2). GEN: Well nourished, well developed, in no acute distress HEENT: normal Neck: no  JVD, carotid bruits, or masses Cardiac: RRR; no murmurs, rubs, or gallops,no edema  Respiratory:  clear to auscultation bilaterally, normal work of breathing GI: soft, nontender, nondistended, + BS MS: no deformity or atrophy Skin: warm and dry  Neuro:  Strength and sensation are intact Psych: euthymic mood, full affect  ILR is interrogated and reveals no arrhythmias  Recent Labs: 08/02/2014: B Natriuretic Peptide 285.1* 08/13/2014: ALT 12; BUN 13; Creatinine, Ser 1.18; Magnesium 1.8; Potassium 3.2*; Sodium 141; TSH 6.813* 09/02/2014: Hemoglobin 13.5; Platelets 240    Lipid Panel     Component Value Date/Time   CHOL 138 08/03/2014 0304   TRIG 73 08/03/2014 0304   HDL 33* 08/03/2014 0304   CHOLHDL 4.2 08/03/2014 0304   VLDL 15 08/03/2014 0304   LDLCALC 90 08/03/2014 0304     Wt Readings from Last 3 Encounters:  11/06/14 81.466 kg (179 lb 9.6 oz)  09/18/14 82.101 kg (181 lb)  09/05/14 81.647 kg (180 lb)    ekg is reviewed   Other studies Reviewed: Additional studies/ records that were reviewed today include: hospital records    ASSESSMENT AND PLAN:  1.  Syncope No recurrence s/p ILR Negative ep study No further workup at this time Will follow remotely with loop recorder  2. Atrial fibrillation He cannot afford eliquis. Will switch to savaysa 60mg  daily (which should cost him only $4)  3. HTN Stable No change required today  4. CAD No ischemic symptoms  5. Chronic systolic dysfunction No chantrs    Current medicines are reviewed at length with the patient today.   The patient does not have concerns regarding his medicines.  The following changes were made today:  none  Labs/ tests ordered today include:  Orders Placed This Encounter  Procedures  . Implantable device check  . EKG 12-Lead    Follow-up with primary cardiologist as scheduled I will follow remotely with loop recorder and see only as needed going forward  Signed, Thompson Grayer, MD    11/07/2014 9:56 PM     [facility] [place] [address] [place] [address] [phone] (office) [phone] (fax)

## 2014-11-12 ENCOUNTER — Encounter: Payer: Self-pay | Admitting: Internal Medicine

## 2014-11-13 ENCOUNTER — Encounter: Payer: Self-pay | Admitting: Internal Medicine

## 2014-11-14 LAB — CUP PACEART REMOTE DEVICE CHECK: Date Time Interrogation Session: 20160616121305

## 2014-11-26 ENCOUNTER — Encounter: Payer: Self-pay | Admitting: Internal Medicine

## 2014-11-29 ENCOUNTER — Other Ambulatory Visit: Payer: Self-pay | Admitting: *Deleted

## 2014-11-29 MED ORDER — AMIODARONE HCL 200 MG PO TABS: 200.0000 mg | ORAL_TABLET | Freq: Every day | ORAL | Status: DC

## 2014-12-03 ENCOUNTER — Ambulatory Visit (INDEPENDENT_AMBULATORY_CARE_PROVIDER_SITE_OTHER): Payer: Medicare Other | Admitting: *Deleted

## 2014-12-03 DIAGNOSIS — R55 Syncope and collapse: Secondary | ICD-10-CM

## 2014-12-04 ENCOUNTER — Other Ambulatory Visit: Payer: Self-pay | Admitting: Cardiology

## 2014-12-04 NOTE — Telephone Encounter (Signed)
Contacted [facility]jandro [name][facility] and they confirmed that a new prescription for spironolactone is on profile for patient.

## 2014-12-04 NOTE — Telephone Encounter (Signed)
Requesting refill on spironolactone (ALDACTONE) 25 MG tablet

## 2014-12-04 NOTE — Progress Notes (Signed)
Loop recorder 

## 2014-12-05 LAB — CUP PACEART REMOTE DEVICE CHECK: Date Time Interrogation Session: 20160707112941

## 2014-12-18 ENCOUNTER — Encounter: Payer: Self-pay | Admitting: Internal Medicine

## 2014-12-25 ENCOUNTER — Emergency Department (HOSPITAL_COMMUNITY): Payer: Medicare Other

## 2014-12-25 ENCOUNTER — Observation Stay (HOSPITAL_COMMUNITY)
Admission: EM | Admit: 2014-12-25 | Discharge: 2014-12-26 | Disposition: A | Discharge status: Home / Self Care | Payer: Medicare Other | Attending: Internal Medicine | Admitting: Internal Medicine

## 2014-12-25 ENCOUNTER — Encounter (HOSPITAL_COMMUNITY): Payer: Self-pay

## 2014-12-25 DIAGNOSIS — Z9889 Other specified postprocedural states: Secondary | ICD-10-CM | POA: Diagnosis not present

## 2014-12-25 DIAGNOSIS — I429 Cardiomyopathy, unspecified: Secondary | ICD-10-CM | POA: Diagnosis not present

## 2014-12-25 DIAGNOSIS — N179 Acute kidney failure, unspecified: Secondary | ICD-10-CM | POA: Diagnosis not present

## 2014-12-25 DIAGNOSIS — I2510 Atherosclerotic heart disease of native coronary artery without angina pectoris: Secondary | ICD-10-CM | POA: Diagnosis not present

## 2014-12-25 DIAGNOSIS — I951 Orthostatic hypotension: Secondary | ICD-10-CM | POA: Diagnosis not present

## 2014-12-25 DIAGNOSIS — E785 Hyperlipidemia, unspecified: Secondary | ICD-10-CM | POA: Diagnosis not present

## 2014-12-25 DIAGNOSIS — G459 Transient cerebral ischemic attack, unspecified: Secondary | ICD-10-CM | POA: Diagnosis not present

## 2014-12-25 DIAGNOSIS — Z7901 Long term (current) use of anticoagulants: Secondary | ICD-10-CM | POA: Diagnosis not present

## 2014-12-25 DIAGNOSIS — I472 Ventricular tachycardia: Secondary | ICD-10-CM | POA: Insufficient documentation

## 2014-12-25 DIAGNOSIS — Z7952 Long term (current) use of systemic steroids: Secondary | ICD-10-CM | POA: Diagnosis not present

## 2014-12-25 DIAGNOSIS — Z79899 Other long term (current) drug therapy: Secondary | ICD-10-CM | POA: Diagnosis not present

## 2014-12-25 DIAGNOSIS — I420 Dilated cardiomyopathy: Secondary | ICD-10-CM | POA: Insufficient documentation

## 2014-12-25 DIAGNOSIS — I950 Idiopathic hypotension: Secondary | ICD-10-CM

## 2014-12-25 DIAGNOSIS — I480 Paroxysmal atrial fibrillation: Secondary | ICD-10-CM | POA: Insufficient documentation

## 2014-12-25 DIAGNOSIS — M6289 Other specified disorders of muscle: Secondary | ICD-10-CM | POA: Diagnosis not present

## 2014-12-25 DIAGNOSIS — R531 Weakness: Secondary | ICD-10-CM | POA: Diagnosis present

## 2014-12-25 DIAGNOSIS — I10 Essential (primary) hypertension: Secondary | ICD-10-CM | POA: Diagnosis not present

## 2014-12-25 DIAGNOSIS — I959 Hypotension, unspecified: Secondary | ICD-10-CM

## 2014-12-25 HISTORY — DX: Transient cerebral ischemic attack, unspecified: G45.9

## 2014-12-25 HISTORY — DX: Other amnesia: R41.3

## 2014-12-25 LAB — COMPREHENSIVE METABOLIC PANEL
ALK PHOS: 56 U/L (ref 38–126)
ALT: 21 U/L (ref 17–63)
AST: 39 U/L (ref 15–41)
Albumin: 3.9 g/dL (ref 3.5–5.0)
Anion gap: 10 (ref 5–15)
BUN: 27 mg/dL — ABNORMAL HIGH (ref 6–20)
CALCIUM: 8.9 mg/dL (ref 8.9–10.3)
CHLORIDE: 104 mmol/L (ref 101–111)
CO2: 23 mmol/L (ref 22–32)
Creatinine, Ser: 2.5 mg/dL — ABNORMAL HIGH (ref 0.61–1.24)
GFR, EST AFRICAN AMERICAN: 26 mL/min — AB (ref >60)
GFR, EST NON AFRICAN AMERICAN: 23 mL/min — AB (ref >60)
Glucose, Bld: 133 mg/dL — ABNORMAL HIGH (ref 65–99)
Potassium: 4.1 mmol/L (ref 3.5–5.1)
Sodium: 137 mmol/L (ref 135–145)
TOTAL PROTEIN: 6.8 g/dL (ref 6.5–8.1)
Total Bilirubin: 0.9 mg/dL (ref 0.3–1.2)

## 2014-12-25 LAB — DIFFERENTIAL
Basophils Absolute: 0 10*3/uL (ref 0.0–0.1)
Basophils Relative: 0 % (ref 0–1)
EOS ABS: 0.4 10*3/uL (ref 0.0–0.7)
Eosinophils Relative: 3 % (ref 0–5)
LYMPHS PCT: 16 % (ref 12–46)
Lymphs Abs: 1.8 10*3/uL (ref 0.7–4.0)
Monocytes Absolute: 1.3 10*3/uL — ABNORMAL HIGH (ref 0.1–1.0)
Monocytes Relative: 12 % (ref 3–12)
NEUTROS PCT: 69 % (ref 43–77)
Neutro Abs: 7.5 10*3/uL (ref 1.7–7.7)

## 2014-12-25 LAB — ETHANOL: Alcohol, Ethyl (B): <5 mg/dL (ref <5)

## 2014-12-25 LAB — I-STAT CHEM 8, ED
BUN: 26 mg/dL — ABNORMAL HIGH (ref 6–20)
CREATININE: 2.4 mg/dL — AB (ref 0.61–1.24)
Calcium, Ion: 1.19 mmol/L (ref 1.13–1.30)
Chloride: 103 mmol/L (ref 101–111)
GLUCOSE: 130 mg/dL — AB (ref 65–99)
HCT: 42 % (ref 39.0–52.0)
Hemoglobin: 14.3 g/dL (ref 13.0–17.0)
Potassium: 4.2 mmol/L (ref 3.5–5.1)
SODIUM: 139 mmol/L (ref 135–145)
TCO2: 21 mmol/L (ref 0–100)

## 2014-12-25 LAB — APTT: aPTT: 35 seconds (ref 24–37)

## 2014-12-25 LAB — TSH: TSH: 2.888 u[IU]/mL (ref 0.350–4.500)

## 2014-12-25 LAB — CBC
HCT: 40.1 % (ref 39.0–52.0)
Hemoglobin: 13.1 g/dL (ref 13.0–17.0)
MCH: 31.5 pg (ref 26.0–34.0)
MCHC: 32.7 g/dL (ref 30.0–36.0)
MCV: 96.4 fL (ref 78.0–100.0)
Platelets: 221 10*3/uL (ref 150–400)
RBC: 4.16 MIL/uL — AB (ref 4.22–5.81)
RDW: 14.5 % (ref 11.5–15.5)
WBC: 11.1 10*3/uL — AB (ref 4.0–10.5)

## 2014-12-25 LAB — PROTIME-INR
INR: 1.88 — ABNORMAL HIGH (ref 0.00–1.49)
PROTHROMBIN TIME: 21.6 s — AB (ref 11.6–15.2)

## 2014-12-25 LAB — I-STAT TROPONIN, ED: Troponin i, poc: 0 ng/mL (ref 0.00–0.08)

## 2014-12-25 MED ORDER — SODIUM CHLORIDE 0.9 % IV BOLUS (SEPSIS)
500.0000 mL | Freq: Once | INTRAVENOUS | Status: AC
  Administered 2014-12-25: 500 mL via INTRAVENOUS

## 2014-12-25 MED ORDER — ONDANSETRON HCL 4 MG/2ML IJ SOLN: 4.0000 mg | Freq: Four times a day (QID) | INTRAMUSCULAR | Status: DC | PRN

## 2014-12-25 MED ORDER — ENOXAPARIN SODIUM 40 MG/0.4ML ~~LOC~~ SOLN
40.0000 mg | Freq: Two times a day (BID) | SUBCUTANEOUS | Status: DC
  Administered 2014-12-25: 40 mg via SUBCUTANEOUS
  Filled 2014-12-25 (×2): qty 0.4

## 2014-12-25 MED ORDER — STROKE: EARLY STAGES OF RECOVERY BOOK
Freq: Once | Status: DC
  Filled 2014-12-25: qty 1

## 2014-12-25 MED ORDER — ACETAMINOPHEN 650 MG RE SUPP: 650.0000 mg | Freq: Four times a day (QID) | RECTAL | Status: DC | PRN

## 2014-12-25 MED ORDER — AMIODARONE HCL 200 MG PO TABS
200.0000 mg | ORAL_TABLET | Freq: Every day | ORAL | Status: DC
  Administered 2014-12-26: 200 mg via ORAL
  Filled 2014-12-25: qty 1

## 2014-12-25 MED ORDER — HEPARIN SODIUM (PORCINE) 5000 UNIT/ML IJ SOLN: 5000.0000 [IU] | Freq: Three times a day (TID) | INTRAMUSCULAR | Status: DC

## 2014-12-25 MED ORDER — ATORVASTATIN CALCIUM 20 MG PO TABS
20.0000 mg | ORAL_TABLET | Freq: Every day | ORAL | Status: DC
  Administered 2014-12-25: 20 mg via ORAL
  Filled 2014-12-25: qty 1

## 2014-12-25 MED ORDER — SODIUM CHLORIDE 0.9 % IV SOLN
INTRAVENOUS | Status: DC
  Administered 2014-12-25: 1 mL via INTRAVENOUS
  Administered 2014-12-26: 05:00:00 via INTRAVENOUS

## 2014-12-25 MED ORDER — SODIUM CHLORIDE 0.9 % IV SOLN
INTRAVENOUS | Status: DC
  Administered 2014-12-25 (×2): via INTRAVENOUS

## 2014-12-25 MED ORDER — EDOXABAN TOSYLATE 60 MG PO TABS: 60.0000 mg | ORAL_TABLET | Freq: Every day | ORAL | Status: DC

## 2014-12-25 MED ORDER — SODIUM CHLORIDE 0.9 % IV SOLN
INTRAVENOUS | Status: AC
  Administered 2014-12-25: 1 mL via INTRAVENOUS

## 2014-12-25 MED ORDER — SODIUM CHLORIDE 0.9 % IJ SOLN: 3.0000 mL | Freq: Two times a day (BID) | INTRAMUSCULAR | Status: DC

## 2014-12-25 MED ORDER — ACETAMINOPHEN 325 MG PO TABS: 650.0000 mg | ORAL_TABLET | Freq: Four times a day (QID) | ORAL | Status: DC | PRN

## 2014-12-25 MED ORDER — ONDANSETRON HCL 4 MG PO TABS: 4.0000 mg | ORAL_TABLET | Freq: Four times a day (QID) | ORAL | Status: DC | PRN

## 2014-12-25 NOTE — H&P (Signed)
[facility] History and Physical  Alejandro [name] WER:[id number] DOB: 11-17-1932 DOA: 12/25/2014  Referring physician: Dr. Thurnell Garbe - [facility] PCP: Glenda Chroman., MD   Chief Complaint: RUE and RLE weakness  HPI: Alejandro [name] is a 81 y.o. male  RUE and RLE weakness. Started around lunchtime today. Pt had been outside working when syptoms started. No AC in house. Acute onset. No change prior to ED evaluation. Nothing made symptoms better but worse with exertion. Now nearly resolved after rest and IVF in ED. Associated w/ blurry vision, HA. History of TIA in the past.  Review of Systems:  Constitutional:  No weight loss, night sweats, Fevers, chills, fatigue.  HEENT:  No Difficulty swallowing,Tooth/dental problems,Sore throat,  No sneezing, itching, ear ache, nasal congestion, post nasal drip,  Cardio-vascular:  No chest pain, Orthopnea, PND, swelling in lower extremities, anasarca, dizziness, palpitations  GI:  No heartburn, indigestion, abdominal pain, nausea, vomiting, diarrhea, change in bowel habits, loss of appetite  Resp:   No shortness of breath with exertion or at rest. No excess mucus, no productive cough, No non-productive cough, No coughing up of blood.No change in color of mucus.No wheezing.No chest wall deformity  Skin:  no rash or lesions.  GU:  no dysuria, change in color of urine, no urgency or frequency. No flank pain.  Musculoskeletal:  Per HPI Psych:  No change in mood or affect. No depression or anxiety. No memory loss.   Past Medical History  Diagnosis Date  . Essential hypertension   . Secondary cardiomyopathy     LVEF 40-45% January 2014  . Paroxysmal atrial fibrillation   . Congestive dilated cardiomyopathy   . NSVT (nonsustained ventricular tachycardia)     Negative EP study  . Syncope   . CAD (coronary artery disease)   . TIA (transient ischemic attack)   . Memory loss     per pt   Past Surgical History  Procedure Laterality Date  .  Laparoscopic appendectomy  November 2015    Dr. [name]  . Left heart catheterization with coronary angiogram N/A 07/31/2014    Procedure: LEFT HEART CATHETERIZATION WITH CORONARY ANGIOGRAM;  Surgeon: [name], MD;  Location: [facility] CATH LAB;  Service: Cardiovascular;  Laterality: N/A;  . Electrophysiology study N/A 08/05/2014    Procedure: ELECTROPHYSIOLOGY STUDY;  Surgeon: Deboraha Sprang, MD;  Location: [facility] CATH LAB;  Service: Cardiovascular;  Laterality: N/A;  . Colonoscopy  unknown  . Colonoscopy N/A 09/05/2014    Procedure: COLONOSCOPY;  Surgeon: [name], MD;  [facility];  Service: Endoscopy;  Laterality: N/A;  145pm  . Cataract extraction     Social History:  reports that he has never smoked. He has never used smokeless tobacco. He reports that he does not drink alcohol or use illicit drugs.  No Known Allergies  Family History  Problem Relation Age of Onset  . Heart disease Father     Diagnosed in his 54s  . Arthritis Father   . Colon cancer Neg Hx     "not that I can remember"  . Colon polyps Neg Hx     "not that I can remember"  . Alzheimer's disease Mother      Prior to Admission medications   Medication Sig Start Date End Date Taking? Authorizing Provider  acetaminophen (TYLENOL) 500 MG tablet Take 500-1,000 mg by mouth every 6 (six) hours as needed for mild pain.   Yes Historical Provider, MD  amiodarone (PACERONE) 200 MG tablet  Take 1 tablet (200 mg total) by mouth daily. In the morning 11/29/14  Yes Satira Sark, MD  atorvastatin (LIPITOR) 20 MG tablet Take 1 tablet (20 mg total) by mouth daily at 6 PM. 09/27/14  Yes Satira Sark, MD  edoxaban (SAVAYSA) 60 MG TABS tablet Take 60 mg by mouth daily. 11/06/14  Yes Thompson Grayer, MD  lisinopril (PRINIVIL,ZESTRIL) 2.5 MG tablet Take 1 tablet (2.5 mg total) by mouth daily. In the evening 09/27/14  Yes Satira Sark, MD  spironolactone (ALDACTONE) 25 MG tablet TAKE (1/2) TABLET BY MOUTH DAILY. 10/07/14   Yes Satira Sark, MD   Physical Exam: Filed Vitals:   12/25/14 1919 12/25/14 2000 12/25/14 2100 12/25/14 2107  BP: 108/72 101/68 105/67   Pulse: 54 54 49   Temp: 97.7 F (36.5 C)     TempSrc: Oral     Resp: 16 27    Height:      Weight:      SpO2: 100% 95%  95%    Wt Readings from Last 3 Encounters:  12/25/14 81.647 kg (180 lb)  12/25/14 81.647 kg (180 lb)  11/06/14 81.466 kg (179 lb 9.6 oz)    General:  Appears calm and comfortable Eyes:  PERRL, normal lids, irises & conjunctiva ENT:  grossly normal hearing, lips & tongue Neck:  no LAD, masses or thyromegaly Cardiovascular:  RRR, no m/r/g. No LE edema. Telemetry:  SR, no arrhythmias  Respiratory:  CTA bilaterally, no w/r/r. Normal respiratory effort. Abdomen:  soft, ntnd Skin:  no rash or induration seen on limited exam Musculoskeletal:  grossly normal tone BUE/BLE Psychiatric:  grossly normal mood and affect, speech fluent and appropriate Neurologic:  grossly non-focal.          Labs on Admission:  Basic Metabolic Panel:  Recent Labs Lab 12/25/14 1613 12/25/14 1620  NA 137 139  K 4.1 4.2  CL 104 103  CO2 23  --   GLUCOSE 133* 130*  BUN 27* 26*  CREATININE 2.50* 2.40*  CALCIUM 8.9  --    Liver Function Tests:  Recent Labs Lab 12/25/14 1613  AST 39  ALT 21  ALKPHOS 56  BILITOT 0.9  PROT 6.8  ALBUMIN 3.9   No results for input(s): LIPASE, AMYLASE in the last 168 hours. No results for input(s): AMMONIA in the last 168 hours. CBC:  Recent Labs Lab 12/25/14 1613 12/25/14 1620  WBC 11.1*  --   NEUTROABS 7.5  --   HGB 13.1 14.3  HCT 40.1 42.0  MCV 96.4  --   PLT 221  --    Cardiac Enzymes: No results for input(s): CKTOTAL, CKMB, CKMBINDEX, TROPONINI in the last 168 hours.  BNP (last 3 results)  Recent Labs  08/02/14 0255  BNP 285.1*    ProBNP (last 3 results) No results for input(s): PROBNP in the last 8760 hours.  CBG: No results for input(s): GLUCAP in the last 168  hours.  Radiological Exams on Admission: Mr Brain Wo Contrast (neuro Protocol)  12/25/2014   CLINICAL DATA:  Intermittent right-sided weakness since yesterday.  EXAM: MRI HEAD WITHOUT CONTRAST  TECHNIQUE: Multiplanar, multiecho pulse sequences of the brain and surrounding structures were obtained without intravenous contrast.  COMPARISON:  MRI brain 07/24/2014.  FINDINGS: The diffusion-weighted images demonstrate no evidence for acute or subacute infarction. 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 Moderate atrophy and white matter changes otherwise stable. Ventricles are proportionate to the degree of atrophy.  No  acute infarct or hemorrhage is present. Flow is present in the major intracranial arteries. Bilateral lens replacements are noted. Mild mucosal thickening is present in the anterior paranasal sinuses. There is some mucosal thickening in the right sphenoid sinus is well. No fluid levels are present.  The skullbase is within normal limits. Midline structures are unremarkable.  IMPRESSION: 1. No acute intracranial abnormality. 2. Expected evolution of left occipital lobe infarct. 3. Stable atrophy and white matter disease. This likely reflects the sequela of chronic microvascular ischemia. 4. Mild anterior paranasal sinus disease without a fluid level.   Electronically Signed   By: San Morelle M.D.   On: 12/25/2014 17:43      Assessment/Plan Principal Problem:   Right sided weakness Active Problems:   Chronic anticoagulation   Orthostatic hypotension   HLD (hyperlipidemia)   Hypotension   AKI (acute kidney injury)   A-fib   RUE and RLE weakness: Likely secondary to overexertion and possible heat exhaustion, and orthostasis.. Of note patient working outdoors in the heat for extended period time and also does not have air conditioning in his home. History of stroke though with no permanent deficits.. After IV fluids and rest symptoms are nearly resolved. MRI  brain without acute process noted - previous occipital lobe infarct noted in microvascular ischemia noted. -Telemetry - Neuro checks - IVF - PT/OT - consider CTA head and neck if symptoms return  Hypotension: Marked hypotension especially with standing. History of syncopal episodes in the past. Symptomatically on orthostatic vital signs. Systolic reading as low as 68. Patient with a baseline blood pressure in the high 80s to low 90s, though there has been a marked gradual decline in his blood pressure starting in late 2015 upon review of office notes.. Last echo January 2016 showing EF of 55-60%  - IVF - Hold lisinopril  - Hold spironolactone - Echo  AKI: Cr 2.5 on admission. Baseline 1.1. Likely secondary to severe dehydration. - IVF - BMET in am  Paroxymal Afib: Rate controlled. No sign of Afib or ACS on EKG - Continue amiodarone, Edoxaban.  HLD: - Continue Lipitor  Code Status: FULL DVT Prophylaxis: Edoxaban Family Communication: None Disposition Plan: Pending improvement  MERRELL, DAVID Lenna Sciara, MD Family Medicine [facility] [email or web address] Password [email or web address]

## 2014-12-25 NOTE — ED Notes (Signed)
Pt updated on plan of care, denies any complaints at present time,  

## 2014-12-25 NOTE — ED Notes (Signed)
IVF on hold, pt in MRI

## 2014-12-25 NOTE — ED Notes (Signed)
Pt do not feel the need to void at this.

## 2014-12-25 NOTE — ED Notes (Signed)
Report given to [place], RN on 300

## 2014-12-25 NOTE — ED Provider Notes (Signed)
CSN: [id number]     Arrival date & time 12/25/14  1537 History   First MD Initiated Contact with Patient 12/25/14 1558     Chief Complaint  Patient presents with  . Extremity Weakness      HPI Pt was seen at 1600. Per pt and his wife, c/o gradual onset and persistence of multiple intermittent episodes of RUE and RLE "weakness" and "numbness" since yesterday morning. Pt states his symptoms last several minutes to several hours each episode. Pt and his wife state they have been without AC in their home since yesterday. States he was working outside today and approximately noon, felt "lightheaded," generally weak, with "blurry vision." Pt states all of his symptoms are currently resolved. Pt states is "worried I had another TIA." Denies CP/palpitations, no SOB/cough, no abd pain, no N/V/D, no slurred speech, no facial droop, no syncope.    Past Medical History  Diagnosis Date  . Essential hypertension   . Secondary cardiomyopathy     LVEF 40-45% January 2014  . Paroxysmal atrial fibrillation   . Congestive dilated cardiomyopathy   . NSVT (nonsustained ventricular tachycardia)     Negative EP study  . Syncope   . CAD (coronary artery disease)   . TIA (transient ischemic attack)   . Memory loss     per pt   Past Surgical History  Procedure Laterality Date  . Laparoscopic appendectomy  November 2015    Dr. [name]  . Left heart catheterization with coronary angiogram N/A 07/31/2014    Procedure: LEFT HEART CATHETERIZATION WITH CORONARY ANGIOGRAM;  Surgeon: [name], MD;  Location: [facility] CATH LAB;  Service: Cardiovascular;  Laterality: N/A;  . Electrophysiology study N/A 08/05/2014    Procedure: ELECTROPHYSIOLOGY STUDY;  Surgeon: Deboraha Sprang, MD;  Location: [facility] CATH LAB;  Service: Cardiovascular;  Laterality: N/A;  . Colonoscopy  unknown  . Colonoscopy N/A 09/05/2014    Procedure: COLONOSCOPY;  Surgeon: [name], MD;  Location: [facility] ENDO SUITE;  Service: Endoscopy;  Laterality:  N/A;  145pm  . Cataract extraction     Family History  Problem Relation Age of Onset  . Heart disease Father     Diagnosed in his 62s  . Arthritis Father   . Colon cancer Neg Hx     "not that I can remember"  . Colon polyps Neg Hx     "not that I can remember"  . Alzheimer's disease Mother    History  Substance Use Topics  . Smoking status: Never Smoker   . Smokeless tobacco: Never Used  . Alcohol Use: No    Review of Systems ROS: Statement: All systems negative except as marked or noted in the HPI; Constitutional: Negative for fever and chills. ; ; Eyes: Negative for eye pain, redness and discharge. ; ; ENMT: Negative for ear pain, hoarseness, nasal congestion, sinus pressure and sore throat. ; ; Cardiovascular: Negative for chest pain, palpitations, diaphoresis, dyspnea and peripheral edema. ; ; Respiratory: Negative for cough, wheezing and stridor. ; ; Gastrointestinal: Negative for nausea, vomiting, diarrhea, abdominal pain, blood in stool, hematemesis, jaundice and rectal bleeding. . ; ; Genitourinary: Negative for dysuria, flank pain and hematuria. ; ; Musculoskeletal: Negative for back pain and neck pain. Negative for swelling and trauma.; ; Skin: Negative for pruritus, rash, abrasions, blisters, bruising and skin lesion.; ; Neuro: +RUE and RLE weakness, paresthesias, blurred vision, lightheadedness. Negative for headache and neck stiffness. Negative for weakness, altered level of consciousness , altered mental  status, involuntary movement, seizure and syncope.      Allergies  Review of patient's allergies indicates no known allergies.  Home Medications   Prior to Admission medications   Medication Sig Start Date End Date Taking? Authorizing Provider  acetaminophen (TYLENOL) 500 MG tablet Take 500-1,000 mg by mouth every 6 (six) hours as needed for mild pain.    Historical Provider, MD  amiodarone (PACERONE) 200 MG tablet Take 1 tablet (200 mg total) by mouth daily. In the  morning 11/29/14   Satira Sark, MD  atorvastatin (LIPITOR) 20 MG tablet Take 1 tablet (20 mg total) by mouth daily at 6 PM. 09/27/14   Satira Sark, MD  edoxaban (SAVAYSA) 60 MG TABS tablet Take 60 mg by mouth daily. 11/06/14   Thompson Grayer, MD  hydrocortisone (ANUSOL-HC) 25 MG suppository Place 25 mg rectally 2 (two) times daily.    Historical Provider, MD  lisinopril (PRINIVIL,ZESTRIL) 2.5 MG tablet Take 1 tablet (2.5 mg total) by mouth daily. In the evening 09/27/14   Satira Sark, MD  polyethylene glycol-electrolytes (NULYTELY/GOLYTELY) 420 G solution Take 4,000 mLs by mouth once. 09/03/14   Carlis Stable, NP  spironolactone (ALDACTONE) 25 MG tablet TAKE (1/2) TABLET BY MOUTH DAILY. 10/07/14   Satira Sark, MD   BP 68/58 mmHg  Pulse 71  Temp(Src) 98 F (36.7 C) (Oral)  Resp 18  Ht 5\' 8"  (1.727 m)  Wt 180 lb (81.647 kg)  BMI 27.38 kg/m2  SpO2 94%   16:30:35 Orthostatic Vital Signs DA  Orthostatic Lying  - BP- Lying: 87/70 mmHg ; Pulse- Lying: 68  Orthostatic Sitting - BP- Sitting: 77/58 mmHg (c/o weakness) ; Pulse- Sitting: 71  Orthostatic Standing at 0 minutes - BP- Standing at 0 minutes: 68/58 mmHg (c/o weakness) ; Pulse- Standing at 0 minutes: 77     Filed Vitals:   12/25/14 1637 12/25/14 1700 12/25/14 1859 12/25/14 1919  BP:  91/62 101/74 108/72  Pulse:  57 62 54  Temp: 98 F (36.7 C)   97.7 F (36.5 C)  TempSrc:    Oral  Resp:  12 18 16   Height:      Weight:      SpO2:  96% 100% 100%    Physical Exam  1605: Physical examination:  Nursing notes reviewed; Vital signs and O2 SAT reviewed;  Constitutional: Well developed, Well nourished, In no acute distress; Head:  Normocephalic, atraumatic; Eyes: EOMI, PERRL, No scleral icterus; ENMT: Mouth and pharynx normal, Mucous membranes dry; Neck: Supple, Full range of motion, No lymphadenopathy; Cardiovascular: Regular rate and rhythm, No gallop; Respiratory: Breath sounds clear & equal bilaterally, No wheezes.  Speaking  full sentences with ease, Normal respiratory effort/excursion; Chest: Nontender, Movement normal; Abdomen: Soft, Nontender, Nondistended, Normal bowel sounds; Genitourinary: No CVA tenderness; Extremities: Pulses normal, No tenderness, No edema, No calf edema or asymmetry.; Neuro: AA&Ox3, Major CN grossly intact. Speech clear.  No facial droop.  No nystagmus. Grips equal. Strength 5/5 equal bilat UE's and LE's.  DTR 2/4 equal bilat UE's and LE's.  No gross sensory deficits.  Normal cerebellar testing bilat UE's (finger-nose) and LE's (heel-shin)..; Skin: Color normal, Warm, Dry.   ED Course  Procedures     EKG Interpretation   Date/Time:  Wednesday December 25 2014 15:56:15 EDT Ventricular Rate:  64 PR Interval:  253 QRS Duration: 112 QT Interval:  420 QTC Calculation: 433 R Axis:   -64 Text Interpretation:  Sinus rhythm with 1st degree A-V block  Left axis  deviation Left anterior fascicular block Consider anterior infarct  Baseline wander When compared with ECG of 08/12/2014 No significant change  was found Confirmed by [facility]  MD, Nunzio Cory [phone]) on 12/25/2014 4:17:21  PM      MDM  MDM Reviewed: previous chart, nursing note and vitals Reviewed previous: labs and ECG Interpretation: labs, ECG, MRI and CT scan Total time providing critical care: 30-74 minutes. This excludes time spent performing separately reportable procedures and services. Consults: admitting MD   CRITICAL CARE Performed by: Alfonzo Feller Total critical care time: 35 Critical care time was exclusive of separately billable procedures and treating other patients. Critical care was necessary to treat or prevent imminent or life-threatening deterioration. Critical care was time spent personally by me on the following activities: development of treatment plan with patient and/or surrogate as well as nursing, discussions with consultants, evaluation of patient's response to treatment, examination of patient,  obtaining history from patient or surrogate, ordering and performing treatments and interventions, ordering and review of laboratory studies, ordering and review of radiographic studies, pulse oximetry and re-evaluation of patient's condition.    Results for orders placed or performed during the hospital encounter of 12/25/14  Ethanol  Result Value Ref Range   Alcohol, Ethyl (B) <5 <5 mg/dL  Protime-INR  Result Value Ref Range   Prothrombin Time 21.6 (H) 11.6 - 15.2 seconds   INR 1.88 (H) 0.00 - 1.49  APTT  Result Value Ref Range   aPTT 35 24 - 37 seconds  CBC  Result Value Ref Range   WBC 11.1 (H) 4.0 - 10.5 K/uL   RBC 4.16 (L) 4.22 - 5.81 MIL/uL   Hemoglobin 13.1 13.0 - 17.0 g/dL   HCT 40.1 39.0 - 52.0 %   MCV 96.4 78.0 - 100.0 fL   MCH 31.5 26.0 - 34.0 pg   MCHC 32.7 30.0 - 36.0 g/dL   RDW 14.5 11.5 - 15.5 %   Platelets 221 150 - 400 K/uL  Differential  Result Value Ref Range   Neutrophils Relative % 69 43 - 77 %   Neutro Abs 7.5 1.7 - 7.7 K/uL   Lymphocytes Relative 16 12 - 46 %   Lymphs Abs 1.8 0.7 - 4.0 K/uL   Monocytes Relative 12 3 - 12 %   Monocytes Absolute 1.3 (H) 0.1 - 1.0 K/uL   Eosinophils Relative 3 0 - 5 %   Eosinophils Absolute 0.4 0.0 - 0.7 K/uL   Basophils Relative 0 0 - 1 %   Basophils Absolute 0.0 0.0 - 0.1 K/uL  Comprehensive metabolic panel  Result Value Ref Range   Sodium 137 135 - 145 mmol/L   Potassium 4.1 3.5 - 5.1 mmol/L   Chloride 104 101 - 111 mmol/L   CO2 23 22 - 32 mmol/L   Glucose, Bld 133 (H) 65 - 99 mg/dL   BUN 27 (H) 6 - 20 mg/dL   Creatinine, Ser 2.50 (H) 0.61 - 1.24 mg/dL   Calcium 8.9 8.9 - 10.3 mg/dL   Total Protein 6.8 6.5 - 8.1 g/dL   Albumin 3.9 3.5 - 5.0 g/dL   AST 39 15 - 41 U/L   ALT 21 17 - 63 U/L   Alkaline Phosphatase 56 38 - 126 U/L   Total Bilirubin 0.9 0.3 - 1.2 mg/dL   GFR calc non Af Amer 23 (L) >60 mL/min   GFR calc Af Amer 26 (L) >60 mL/min   Anion gap 10 5 - 15  I-Stat Chem 8, ED  (not at [facility], [facility])   Result Value Ref Range   Sodium 139 135 - 145 mmol/L   Potassium 4.2 3.5 - 5.1 mmol/L   Chloride 103 101 - 111 mmol/L   BUN 26 (H) 6 - 20 mg/dL   Creatinine, Ser 2.40 (H) 0.61 - 1.24 mg/dL   Glucose, Bld 130 (H) 65 - 99 mg/dL   Calcium, Ion 1.19 1.13 - 1.30 mmol/L   TCO2 21 0 - 100 mmol/L   Hemoglobin 14.3 13.0 - 17.0 g/dL   HCT 42.0 39.0 - 52.0 %  I-stat troponin, ED (not at [facility], [facility])  Result Value Ref Range   Troponin i, poc 0.00 0.00 - 0.08 ng/mL   Comment 3           Mr Brain Wo Contrast (neuro Protocol) 12/25/2014   CLINICAL DATA:  Intermittent right-sided weakness since yesterday.  EXAM: MRI HEAD WITHOUT CONTRAST  TECHNIQUE: Multiplanar, multiecho pulse sequences of the brain and surrounding structures were obtained without intravenous contrast.  COMPARISON:  MRI brain 07/24/2014.  FINDINGS: The diffusion-weighted images demonstrate no evidence for acute or subacute infarction. 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 Moderate atrophy and white matter changes otherwise stable. Ventricles are proportionate to the degree of atrophy.  No acute infarct or hemorrhage is present. Flow is present in the major intracranial arteries. Bilateral lens replacements are noted. Mild mucosal thickening is present in the anterior paranasal sinuses. There is some mucosal thickening in the right sphenoid sinus is well. No fluid levels are present.  The skullbase is within normal limits. Midline structures are unremarkable.  IMPRESSION: 1. No acute intracranial abnormality. 2. Expected evolution of left occipital lobe infarct. 3. Stable atrophy and white matter disease. This likely reflects the sequela of chronic microvascular ischemia. 4. Mild anterior paranasal sinus disease without a fluid level.   Electronically Signed   By: San Morelle M.D.   On: 12/25/2014 17:43    1850:  No acute stroke on MRI brain. Orthostatic on VS; judicious IVF continues. BUN/Cr per baseline.  Dx and  testing d/w pt and family.  Questions answered.  Verb understanding, agreeable to admit. T/C to [facility] Dr. Marily Memos, case discussed, including:  HPI, pertinent PM/SHx, VS/PE, dx testing, ED course and treatment:  Agreeable to admit, requests to write temporary orders, obtain tele bed to team [facility].   Francine Graven, DO 12/27/14 [phone]

## 2014-12-25 NOTE — ED Notes (Signed)
Pt. Updated on plan of care

## 2014-12-25 NOTE — ED Notes (Signed)
Pt states he has numbness in his right leg and arm. States symptoms started this morning at 1000. Also, states he feels weak and dizzy and had a headache but took tylenol and the pain in his head is gone. Pt states he has a stroke February this year with memory loss

## 2014-12-26 ENCOUNTER — Other Ambulatory Visit: Payer: Self-pay

## 2014-12-26 ENCOUNTER — Telehealth: Payer: Self-pay | Admitting: Cardiology

## 2014-12-26 ENCOUNTER — Other Ambulatory Visit (HOSPITAL_COMMUNITY): Payer: Medicare Other

## 2014-12-26 DIAGNOSIS — M6289 Other specified disorders of muscle: Secondary | ICD-10-CM | POA: Diagnosis not present

## 2014-12-26 DIAGNOSIS — I959 Hypotension, unspecified: Secondary | ICD-10-CM

## 2014-12-26 DIAGNOSIS — N179 Acute kidney failure, unspecified: Secondary | ICD-10-CM | POA: Diagnosis not present

## 2014-12-26 DIAGNOSIS — E860 Dehydration: Secondary | ICD-10-CM

## 2014-12-26 DIAGNOSIS — I480 Paroxysmal atrial fibrillation: Secondary | ICD-10-CM

## 2014-12-26 DIAGNOSIS — I951 Orthostatic hypotension: Secondary | ICD-10-CM

## 2014-12-26 DIAGNOSIS — R55 Syncope and collapse: Secondary | ICD-10-CM

## 2014-12-26 DIAGNOSIS — I429 Cardiomyopathy, unspecified: Secondary | ICD-10-CM

## 2014-12-26 LAB — URINE MICROSCOPIC-ADD ON

## 2014-12-26 LAB — URINALYSIS, ROUTINE W REFLEX MICROSCOPIC
BILIRUBIN URINE: NEGATIVE
GLUCOSE, UA: NEGATIVE mg/dL
Hgb urine dipstick: NEGATIVE
KETONES UR: NEGATIVE mg/dL
NITRITE: NEGATIVE
PROTEIN: NEGATIVE mg/dL
SPECIFIC GRAVITY, URINE: 1.025 (ref 1.005–1.030)
UROBILINOGEN UA: 0.2 mg/dL (ref 0.0–1.0)
pH: 6 (ref 5.0–8.0)

## 2014-12-26 LAB — RAPID URINE DRUG SCREEN, HOSP PERFORMED
Amphetamines: NOT DETECTED
BENZODIAZEPINES: NOT DETECTED
Barbiturates: NOT DETECTED
COCAINE: NOT DETECTED
OPIATES: NOT DETECTED
TETRAHYDROCANNABINOL: NOT DETECTED

## 2014-12-26 LAB — BASIC METABOLIC PANEL
ANION GAP: 7 (ref 5–15)
BUN: 24 mg/dL — ABNORMAL HIGH (ref 6–20)
CO2: 25 mmol/L (ref 22–32)
Calcium: 8.2 mg/dL — ABNORMAL LOW (ref 8.9–10.3)
Chloride: 108 mmol/L (ref 101–111)
Creatinine, Ser: 1.64 mg/dL — ABNORMAL HIGH (ref 0.61–1.24)
GFR calc non Af Amer: 38 mL/min — ABNORMAL LOW (ref >60)
GFR, EST AFRICAN AMERICAN: 44 mL/min — AB (ref >60)
GLUCOSE: 88 mg/dL (ref 65–99)
POTASSIUM: 4.1 mmol/L (ref 3.5–5.1)
Sodium: 140 mmol/L (ref 135–145)

## 2014-12-26 NOTE — Telephone Encounter (Signed)
BMET order placed for pt to have drawn,mailed lab slip

## 2014-12-26 NOTE — Discharge Summary (Signed)
Physician Discharge Summary  Alejandro [name] TZG:[id number] DOB: 15-Jun-1932 DOA: 12/25/2014  PCP: Glenda Chroman., MD  Admit date: 12/25/2014 Discharge date: 12/26/2014  Time spent: 40 minutes  Recommendations for Outpatient Follow-up:  1. Discontinue Spironolactone and Lisinopril, until you follow up with Cardiology, Dr. Domenic Polite, in one week for repeat blood and kidney function testing.    Discharge Diagnoses:  Principal Problem:   Acute renal failure Active Problems:   Chronic anticoagulation   Orthostatic hypotension   HLD (hyperlipidemia)   Hypotension   Generalized weakness   Paroxysmal atrial fibrillation   Dehydration   Chronic mild right-sided weakness   Discharge Condition: Improved.  Diet recommendation: Heart Healthy  Filed Weights   12/25/14 1552 12/25/14 1555 12/25/14 2136  Weight: 81.647 kg (180 lb) 81.647 kg (180 lb) 81.194 kg (179 lb)    History of present illness:  This patient was admitted to the hospital for extremity and lower extremity weakness. He was working outside for most of the day. He did not have near condition in his house. He had associated blurred vision and headache. There was concern for possible TIA/CVA and he was admitted for further workup.Marland Kitchen  Hospital Course:  Right sided weakness. Initial concerns for possible CVA, although MRI brain done in the emergency room was negative for infarct. On further questioning, patient reports that his right-sided weakness is a chronic issue and did not develop on the day of admission. At this time, his strength is equal bilaterally, 5 out of 5. He does not appear to have any significant neurologic deficits at this time. No further workup  Acute renal failure. Related to dehydration, decreased by mouth intake. Patient received IV fluids and renal function is not improving. He is advised to continue with aggressive oral hydration after discharge. We'll continue to hold ACE inhibitor and spironolactone until he  follows up with his outpatient physicians as a repeat basic metabolic panel done to monitor his renal function.  Generalized weakness. Related to dehydration and renal failure. Improved at the time of discharge with IV fluids.  Paroxysmal atrial fibrillation. Continue amiodarone and anticoagulation.  Procedures:  Consultations:   Discharge Exam: Filed Vitals:   12/26/14 0905  BP:   Pulse:   Temp: 97.9 F (36.6 C)  Resp:     1. General: NAD 2. Cardiovascular: S1, S2 irregular 3. Respiratory: clear bilaterally 4. Abdomen: soft, non tendert, no distention , bowel sounds normal 5. Musculoskeletal: No edema b/l  Discharge Instructions    Current Discharge Medication List    CONTINUE these medications which have NOT CHANGED   Details  acetaminophen (TYLENOL) 500 MG tablet Take 500-1,000 mg by mouth every 6 (six) hours as needed for mild pain.    amiodarone (PACERONE) 200 MG tablet Take 1 tablet (200 mg total) by mouth daily. In the morning Qty: 30 tablet, Refills: 6    atorvastatin (LIPITOR) 20 MG tablet Take 1 tablet (20 mg total) by mouth daily at 6 PM. Qty: 30 tablet, Refills: 6    edoxaban (SAVAYSA) 60 MG TABS tablet Take 60 mg by mouth daily. Qty: 30 tablet, Refills: 11   Associated Diagnoses: Paroxysmal atrial fibrillation    lisinopril (PRINIVIL,ZESTRIL) 2.5 MG tablet Take 1 tablet (2.5 mg total) by mouth daily. In the evening Qty: 30 tablet, Refills: 6    spironolactone (ALDACTONE) 25 MG tablet TAKE (1/2) TABLET BY MOUTH DAILY. Qty: 15 tablet, Refills: 6       No Known Allergies    The results of  significant diagnostics from this hospitalization (including imaging, microbiology, ancillary and laboratory) are listed below for reference.    Significant Diagnostic Studies: Mr Brain Wo Contrast (neuro Protocol)  12/25/2014   CLINICAL DATA:  Intermittent right-sided weakness since yesterday.  EXAM: MRI HEAD WITHOUT CONTRAST  TECHNIQUE: Multiplanar,  multiecho pulse sequences of the brain and surrounding structures were obtained without intravenous contrast.  COMPARISON:  MRI brain 07/24/2014.  FINDINGS: The diffusion-weighted images demonstrate no evidence for acute or subacute infarction. 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 Moderate atrophy and white matter changes otherwise stable. Ventricles are proportionate to the degree of atrophy.  No acute infarct or hemorrhage is present. Flow is present in the major intracranial arteries. Bilateral lens replacements are noted. Mild mucosal thickening is present in the anterior paranasal sinuses. There is some mucosal thickening in the right sphenoid sinus is well. No fluid levels are present.  The skullbase is within normal limits. Midline structures are unremarkable.  IMPRESSION: 1. No acute intracranial abnormality. 2. Expected evolution of left occipital lobe infarct. 3. Stable atrophy and white matter disease. This likely reflects the sequela of chronic microvascular ischemia. 4. Mild anterior paranasal sinus disease without a fluid level.   Electronically Signed   By: San Morelle M.D.   On: 12/25/2014 17:43   Labs: Basic Metabolic Panel:  Recent Labs Lab 12/25/14 1613 12/25/14 1620 12/26/14 0555  NA 137 139 140  K 4.1 4.2 4.1  CL 104 103 108  CO2 23  --  25  GLUCOSE 133* 130* 88  BUN 27* 26* 24*  CREATININE 2.50* 2.40* 1.64*  CALCIUM 8.9  --  8.2*   Liver Function Tests:  Recent Labs Lab 12/25/14 1613  AST 39  ALT 21  ALKPHOS 56  BILITOT 0.9  PROT 6.8  ALBUMIN 3.9   No results for input(s): LIPASE, AMYLASE in the last 168 hours. No results for input(s): AMMONIA in the last 168 hours. CBC:  Recent Labs Lab 12/25/14 1613 12/25/14 1620  WBC 11.1*  --   NEUTROABS 7.5  --   HGB 13.1 14.3  HCT 40.1 42.0  MCV 96.4  --   PLT 221  --    CBNP: BNP (last 3 results)  Recent Labs  08/02/14 0255  BNP 285.1*        Signed:  Kathie Dike, MD  [facility] 12/26/2014, 9:34 AM   I, Laban Emperor. Leonie Green, am scribing for Dr. Kathie Dike, M.D. on 12/25/2014 at 7:52 AM   Attending:  I have reviewed the above documentation for accuracy and completeness, and I agree with the above.  MEMON,JEHANZEB

## 2014-12-26 NOTE — Telephone Encounter (Signed)
Patient just got out of [facility].   Note stated to have kidney function checked before followup with Domenic Polite, but nooab work was given to the patient. Please check on this and let patient know when and where to go to have done

## 2014-12-26 NOTE — Progress Notes (Signed)
OT Screen  Patient Details Name: Alejandro [name] MRN: [id number] DOB: 1932/09/27   OT Screen:     Reason evaluation not completed: Pt awake, alert, and oriented x4 this am. Pt reports his symptoms have resolved, no further weakness or blurred vision. Pt demonstrates good BUE range of motion and strength, 5/5. Pt is independent in all B/IADL tasks. No further OT services required at this time.   Guadelupe Sabin, OTR/L  [phone]  12/26/2014, 9:27 AM

## 2014-12-26 NOTE — Progress Notes (Signed)
SLP Cancellation Note  Patient Details Name: Alejandro [name] MRN: [id number] DOB: 10/31/1932   Cancelled treatment:       Reason Eval/Treat Not Completed: SLP screened, no needs identified, will sign off   PORTER,DABNEY 12/26/2014, 10:04 AM

## 2014-12-26 NOTE — Evaluation (Signed)
Physical Therapy Evaluation Patient Details Name: Alejandro [name] MRN: [id number] DOB: 1933-01-27 Today's Date: 12/26/2014   History of Present Illness  RUE and RLE weakness. Started around lunchtime today. Pt had been outside working when syptoms started. No AC in house. Acute onset. No change prior to ED evaluation. Nothing made symptoms better but worse with exertion. Now nearly resolved after rest and IVF in ED. Associated w/ blurry vision, HA. History of TIA in the past.  Clinical Impression   Pt was seen for evaluation.  He was alert and oriented, very cooperative.  He reports feeling back to normal today with no residual weakness.  He states that he does have a little difficulty climbing steps due to "rheumatism" primarily in his hip.  He does have a mild antalgic gait but otherwise he has no problems with strength, balance or functional mobility.  He was instructed in strategies to assist gait in steps to minimize right hip pain and maximize stability.      Follow Up Recommendations No PT follow up    Equipment Recommendations  None recommended by PT    Recommendations for Other Services   none    Precautions / Restrictions Precautions Precautions: None Restrictions Weight Bearing Restrictions: No      Mobility  Bed Mobility Overal bed mobility: Independent                Transfers Overall transfer level: Independent                  Ambulation/Gait Ambulation/Gait assistance: Independent Ambulation Distance (Feet): 350 Feet Assistive device: None Gait Pattern/deviations: WFL(Within Functional Limits);Decreased stance time - right   Gait velocity interpretation: <1.8 ft/sec, indicative of risk for recurrent falls General Gait Details: pt reports some OA in the right hip and he does have a mild limp over the RLE during gait  Stairs Stairs: Yes Stairs assistance: Independent Stair Management: One rail Right Number of Stairs: 3 General stair  comments: pt instructed in strategies to protect the right hip on steps to minimize pain and increase stability  Wheelchair Mobility    Modified Rankin (Stroke Patients Only)       Balance Overall balance assessment: Independent                               Standardized Balance Assessment Standardized Balance Assessment : Dynamic Gait Index   Dynamic Gait Index Level Surface: Normal Change in Gait Speed: Normal Gait with Horizontal Head Turns: Normal Gait with Vertical Head Turns: Normal Gait and Pivot Turn: Normal Step Over Obstacle: Normal Step Around Obstacles: Normal Steps: Normal Total Score: 24       Pertinent Vitals/Pain Pain Assessment: No/denies pain    Home Living Family/patient expects to be discharged to:: Private residence Living Arrangements: Spouse/significant other Available Help at Discharge: Family Type of Home: House Home Access: Stairs to enter Entrance Stairs-Rails: Right Entrance Stairs-Number of Steps: 1 flight Home Layout: Two level Home Equipment: None      Prior Function Level of Independence: Independent               Hand Dominance   Dominant Hand: Right    Extremity/Trunk Assessment               Lower Extremity Assessment: Overall WFL for tasks assessed      Cervical / Trunk Assessment: Normal  Communication   Communication: No difficulties  Cognition Arousal/Alertness:  Awake/alert Behavior During Therapy: WFL for tasks assessed/performed Overall Cognitive Status: Within Functional Limits for tasks assessed                      General Comments      Exercises        Assessment/Plan    PT Assessment Patent does not need any further PT services  PT Diagnosis     PT Problem List    PT Treatment Interventions     PT Goals (Current goals can be found in the Care Plan section) Acute Rehab PT Goals PT Goal Formulation: All assessment and education complete, DC therapy     Frequency     Barriers to discharge        Co-evaluation               End of Session Equipment Utilized During Treatment: Gait belt Activity Tolerance: Patient tolerated treatment well Patient left: in chair Nurse Communication: Mobility status    Functional Assessment Tool Used: clinical judgement Functional Limitation: Mobility: Walking and moving around Mobility: Walking and Moving Around Current Status [id number]): 0 percent impaired, limited or restricted Mobility: Walking and Moving Around Goal Status [phone]): 0 percent impaired, limited or restricted Mobility: Walking and Moving Around Discharge Status [phone]): 0 percent impaired, limited or restricted    Time: 0834-0900 PT Time Calculation (min) (ACUTE ONLY): 26 min   Charges:   PT Evaluation $Initial PT Evaluation Tier I: 1 Procedure     PT G Codes:   PT G-Codes **NOT FOR INPATIENT CLASS** Functional Assessment Tool Used: clinical judgement Functional Limitation: Mobility: Walking and moving around Mobility: Walking and Moving Around Current Status (C6237): 0 percent impaired, limited or restricted Mobility: Walking and Moving Around Goal Status (S2831): 0 percent impaired, limited or restricted Mobility: Walking and Moving Around Discharge Status (D1761): 0 percent impaired, limited or restricted    Sable Feil  PT 12/26/2014, 9:10 AM [phone]

## 2014-12-26 NOTE — Care Management Note (Signed)
Case Management Note  Patient Details  Name: HART HAAS MRN: [id number] Date of Birth: Dec 13, 1932  Expected Discharge Date:  12/27/14               Expected Discharge Plan:  Home/Self Care  In-House Referral:  NA  Discharge planning Services  CM Consult  Post Acute Care Choice:  NA Choice offered to:  NA  DME Arranged:    DME Agency:     HH Arranged:    [place] Agency:     Status of Service:  Completed, signed off  Medicare Important Message Given:    Date Medicare IM Given:    Medicare IM give by:    Date Additional Medicare IM Given:    Additional Medicare Important Message give by:     If discussed at [place] of Stay Meetings, dates discussed:    Additional Comments: Pt is from home and independent with ADL's. Pt admitted for r/o CVA. Pt discharging home today with self care. Pt observation, notification signed by patient, copy given to patient and original placed on patient's shadow chart. No CM needs.  Sherald Barge, RN 12/26/2014, 11:43 AM

## 2015-01-01 ENCOUNTER — Other Ambulatory Visit: Payer: Self-pay | Admitting: *Deleted

## 2015-01-01 ENCOUNTER — Encounter: Payer: Self-pay | Admitting: Cardiology

## 2015-01-01 ENCOUNTER — Ambulatory Visit (INDEPENDENT_AMBULATORY_CARE_PROVIDER_SITE_OTHER): Payer: Medicare Other | Admitting: Cardiology

## 2015-01-01 VITALS — BP 98/64 | HR 73 | Ht 68.0 in | Wt 180.0 lb

## 2015-01-01 DIAGNOSIS — I429 Cardiomyopathy, unspecified: Secondary | ICD-10-CM

## 2015-01-01 DIAGNOSIS — I10 Essential (primary) hypertension: Secondary | ICD-10-CM

## 2015-01-01 DIAGNOSIS — I480 Paroxysmal atrial fibrillation: Secondary | ICD-10-CM | POA: Diagnosis not present

## 2015-01-01 DIAGNOSIS — I255 Ischemic cardiomyopathy: Secondary | ICD-10-CM | POA: Diagnosis not present

## 2015-01-01 DIAGNOSIS — Z87448 Personal history of other diseases of urinary system: Secondary | ICD-10-CM

## 2015-01-01 NOTE — Progress Notes (Signed)
Cardiology Office Note  Date: 01/01/2015   ID: PAULETTE [name], DOB 07-03-1932, MRN [id number]  PCP: Glenda Chroman., MD  Primary Cardiologist: Rozann Lesches, MD   Chief Complaint  Patient presents with  . Hospitalization Follow-up    History of Present Illness: Alejandro [name] is an 81 y.o. male last seen in April. Interval follow-up with Dr. Rayann Heman noted in June. Record review finds recent hospitalization at [facility] with weakness and acute renal failure (creatinine 2.5) felt to be secondary to dehydration. ACE inhibitor and Aldactone were discontinued and he was hydrated. Follow-up lab work from August 1 is noted below showing improvement in creatinine.  He presents for a follow-up visit, states that she feels better. Reports no palpitations or shortness of breath beyond NYHA class II.  His history includes moderate multivessel CAD and cardiomyopathy, although LVEF has improved to the 50-55% range by his last echocardiogram in January of this year.  He continues on amiodarone with history of PAF, recent TSH and LFTs were normal. Chest x-ray from March showed no acute findings.   Past Medical History  Diagnosis Date  . Essential hypertension   . Secondary cardiomyopathy     LVEF 40-45% January 2014  . Paroxysmal atrial fibrillation   . NSVT (nonsustained ventricular tachycardia)     Negative EP study  . Syncope   . CAD (coronary artery disease)     Moderate multivessel March 2016  . TIA (transient ischemic attack)   . Memory loss     Past Surgical History  Procedure Laterality Date  . Laparoscopic appendectomy  November 2015    Dr. [name]  . Left heart catheterization with coronary angiogram N/A 07/31/2014    Procedure: LEFT HEART CATHETERIZATION WITH CORONARY ANGIOGRAM;  Surgeon: [name], MD;  Location: [facility] CATH LAB;  Service: Cardiovascular;  Laterality: N/A;  . Electrophysiology study N/A 08/05/2014    Procedure: ELECTROPHYSIOLOGY STUDY;  Surgeon: Deboraha Sprang, MD;  Location: [facility] CATH LAB;  Service: Cardiovascular;  Laterality: N/A;  . Colonoscopy  unknown  . Colonoscopy N/A 09/05/2014    Procedure: COLONOSCOPY;  Surgeon: [name], MD;  Location[facility]O [facility];  Service: Endoscopy;  Laterality: N/A;  145pm  . Cataract extraction      Current Outpatient Prescriptions  Medication Sig Dispense Refill  . acetaminophen (TYLENOL) 500 MG tablet Take 500-1,000 mg by mouth every 6 (six) hours as needed for mild pain.    Marland Kitchen amiodarone (PACERONE) 200 MG tablet Take 1 tablet (200 mg total) by mouth daily. In the morning 30 tablet 6  . atorvastatin (LIPITOR) 20 MG tablet Take 1 tablet (20 mg total) by mouth daily at 6 PM. 30 tablet 6  . edoxaban (SAVAYSA) 60 MG TABS tablet Take 60 mg by mouth daily. 30 tablet 11   No current facility-administered medications for this visit.    Allergies:  Review of patient's allergies indicates no known allergies.   Social History: The patient  reports that he has never smoked. He has never used smokeless tobacco. He reports that he does not drink alcohol or use illicit drugs.   ROS:  Please see the history of present illness. Otherwise, complete review of systems is positive for decreased hearing.  All other systems are reviewed and negative.   Physical Exam: VS:  BP 98/64 mmHg  Pulse 73  Ht 5\' 8"  (1.727 m)  Wt 180 lb (81.647 kg)  BMI 27.38 kg/m2  SpO2 95%, BMI Body mass index  is 27.38 kg/(m^2).  Wt Readings from Last 3 Encounters:  01/01/15 180 lb (81.647 kg)  12/25/14 179 lb (81.194 kg)  12/25/14 180 lb (81.647 kg)     General: Patient appears comfortable at rest. HEENT: Conjunctiva and lids normal, oropharynx clear with moist mucosa. Neck: Supple, no elevated JVP or carotid bruits, no thyromegaly. Lungs: Clear to auscultation, nonlabored breathing at rest. Cardiac: Regular rate and rhythm, no S3 or significant systolic murmur, no pericardial rub. Abdomen: Soft, nontender, no hepatomegaly, bowel  sounds present, no guarding or rebound. Extremities: No pitting edema, distal pulses 2+. Skin: Warm and dry. Musculoskeletal: No kyphosis. Neuropsychiatric: Alert and oriented x3, affect grossly appropriate.   ECG: Tracing from 12/25/2014 showed normal sinus rhythm with prolonged PR interval, left anterior fascicular block, and decreased R wave progression anteriorly.   Recent Labwork: 08/02/2014: B Natriuretic Peptide 285.1* 08/13/2014: Magnesium 1.8 12/25/2014: ALT 21; AST 39; Hemoglobin 14.3; Platelets 221; TSH 2.888 12/26/2014: BUN 24*; Creatinine, Ser 1.64*; Potassium 4.1; Sodium 140     Component Value Date/Time   CHOL 138 08/03/2014 0304   TRIG 73 08/03/2014 0304   HDL 33* 08/03/2014 0304   CHOLHDL 4.2 08/03/2014 0304   VLDL 15 08/03/2014 0304   LDLCALC 90 08/03/2014 0304   12/30/2014: BUN 18, creatinine 1.1, potassium 4.5  Other Studies Reviewed Today:  Study Conclusions  - Left ventricle: The cavity size was normal. Wall thickness was increased in a pattern of mild LVH. Systolic function was normal. The estimated ejection fraction was in the range of 50% to 55%. Probable hypokinesis of the basal-midinferolateral myocardium. Doppler parameters are consistent with abnormal left ventricular relaxation (grade 1 diastolic dysfunction). - Aortic valve: Trileaflet; mildly calcified leaflets width Lambl&'s excrescences. There was trivial regurgitation. - Aortic root: The aortic root was mildly ectatic. - Mitral valve: Calcified annulus. There was trivial regurgitation. - Right atrium: Central venous pressure (est): 3 mm Hg. - Tricuspid valve: There was trivial regurgitation. - Pulmonary arteries: PA peak pressure: 27 mm Hg (S). - Pericardium, extracardiac: There was no pericardial effusion.  Impressions:  - Mild LVH with LVEF approximately 50-55% in the setting of frequent ventricular ectopy. There is mid to basal inferolateral hypokinesis and grade 1  diastolic dysfunction. Sclerotic aortic valve with trivial aortic regurgitation. Mildly ectatic aortic root. Trivial tricuspid regurgitation with PASP 27 mmHg.  Assessment and Plan:  1. Recent hospital stay with acute renal failure in the setting of dehydration. I will keep him off Aldactone and ACE inhibitor for now, particular in light of low normal blood pressure. We might consider adding these medications later with history of cardiomyopathy. Recheck BMET for next visit.  2. Secondary cardiomyopathy, LVEF 50-55% as of January (30-35% by cardiac catheterization in March). Holding off Aldactone and ACE inhibitor as outlined above. Has been intolerant of beta blocker as well.  3. PAF, maintaining sinus rhythm on amiodarone. Recent lab work reviewed. Also continue Savaysa for stroke prophylaxis.  4. Moderate multivessel CAD, no active angina symptoms.  Current medicines were reviewed with the patient today.   Orders Placed This Encounter  Procedures  . Basic metabolic panel    Disposition: FU with me in 3 months.   Signed, Satira Sark, MD, [facility] 01/01/2015 8:16 AM    [facility] [place], [place], [place] [address] Phone: [phone]; Fax: [phone]

## 2015-01-01 NOTE — Patient Instructions (Signed)
Your physician recommends that you continue on your current medications as directed. Please refer to the Current Medication list given to you today. Your physician recommends that you return for lab work in 3 months just before your next visit to check your BMET. Your lab order was given to you today during office visit. Your physician recommends that you schedule a follow-up appointment in: 3 months. You will receive a reminder letter in the mail in about 1 month reminding you to call and schedule your appointment. If you don't receive this letter, please contact our office.

## 2015-01-02 ENCOUNTER — Ambulatory Visit (INDEPENDENT_AMBULATORY_CARE_PROVIDER_SITE_OTHER): Payer: Medicare Other | Admitting: *Deleted

## 2015-01-02 DIAGNOSIS — R55 Syncope and collapse: Secondary | ICD-10-CM

## 2015-01-03 NOTE — Progress Notes (Signed)
Loop recorder 

## 2015-01-07 LAB — CUP PACEART REMOTE DEVICE CHECK: MDC IDC SESS DTM: 20160809161252

## 2015-01-16 ENCOUNTER — Encounter: Payer: Self-pay | Admitting: Internal Medicine

## 2015-01-31 ENCOUNTER — Ambulatory Visit (INDEPENDENT_AMBULATORY_CARE_PROVIDER_SITE_OTHER): Payer: Medicare Other | Admitting: *Deleted

## 2015-01-31 DIAGNOSIS — R55 Syncope and collapse: Secondary | ICD-10-CM | POA: Diagnosis not present

## 2015-02-05 NOTE — Progress Notes (Signed)
Loop recorder 

## 2015-02-18 ENCOUNTER — Encounter: Payer: Self-pay | Admitting: Internal Medicine

## 2015-02-27 LAB — CUP PACEART REMOTE DEVICE CHECK: Date Time Interrogation Session: 20160903214244

## 2015-02-27 NOTE — Progress Notes (Signed)
Carelink summary report received. Battery status OK. Normal device function. No new symptom, tachy, brady, or AF episodes, +Savaysa. 1 pause episode, nocturnal, ~3 sec. Monthly summary reports and ROV with JA PRN.

## 2015-03-03 ENCOUNTER — Ambulatory Visit (INDEPENDENT_AMBULATORY_CARE_PROVIDER_SITE_OTHER): Payer: Medicare Other | Admitting: *Deleted

## 2015-03-03 DIAGNOSIS — R55 Syncope and collapse: Secondary | ICD-10-CM | POA: Diagnosis not present

## 2015-03-04 NOTE — Progress Notes (Signed)
Loop recorder 

## 2015-03-08 LAB — CUP PACEART REMOTE DEVICE CHECK: MDC IDC SESS DTM: 20161003220556

## 2015-03-08 NOTE — Progress Notes (Signed)
Carelink summary report received. Battery status OK. Normal device function. No new symptom episodes, tachy episodes, brady, or pause episodes. No new AF episodes. Monthly summary reports and ROV w/ JA PRN. 

## 2015-03-20 ENCOUNTER — Encounter: Payer: Self-pay | Admitting: Internal Medicine

## 2015-03-21 ENCOUNTER — Encounter: Payer: Self-pay | Admitting: Internal Medicine

## 2015-04-02 ENCOUNTER — Ambulatory Visit (INDEPENDENT_AMBULATORY_CARE_PROVIDER_SITE_OTHER): Payer: Medicare Other | Admitting: *Deleted

## 2015-04-02 DIAGNOSIS — R55 Syncope and collapse: Secondary | ICD-10-CM

## 2015-04-03 NOTE — Progress Notes (Signed)
Loop recorder 

## 2015-04-04 ENCOUNTER — Ambulatory Visit: Payer: Medicare Other | Admitting: Cardiology

## 2015-04-21 ENCOUNTER — Ambulatory Visit (INDEPENDENT_AMBULATORY_CARE_PROVIDER_SITE_OTHER): Payer: Medicare Other | Admitting: Cardiology

## 2015-04-21 ENCOUNTER — Encounter: Payer: Self-pay | Admitting: Cardiology

## 2015-04-21 ENCOUNTER — Encounter: Payer: Self-pay | Admitting: *Deleted

## 2015-04-21 VITALS — BP 128/80 | HR 57 | Ht 68.0 in | Wt 188.8 lb

## 2015-04-21 DIAGNOSIS — Z79899 Other long term (current) drug therapy: Secondary | ICD-10-CM

## 2015-04-21 DIAGNOSIS — I429 Cardiomyopathy, unspecified: Secondary | ICD-10-CM | POA: Diagnosis not present

## 2015-04-21 DIAGNOSIS — I255 Ischemic cardiomyopathy: Secondary | ICD-10-CM

## 2015-04-21 DIAGNOSIS — Z7901 Long term (current) use of anticoagulants: Secondary | ICD-10-CM

## 2015-04-21 DIAGNOSIS — I480 Paroxysmal atrial fibrillation: Secondary | ICD-10-CM

## 2015-04-21 DIAGNOSIS — E785 Hyperlipidemia, unspecified: Secondary | ICD-10-CM | POA: Diagnosis not present

## 2015-04-21 MED ORDER — ATORVASTATIN CALCIUM 20 MG PO TABS: 20.0000 mg | ORAL_TABLET | Freq: Every day | ORAL | Status: DC

## 2015-04-21 NOTE — Progress Notes (Signed)
Cardiology Office Note  Date: 04/21/2015   ID: Alejandro [name], DOB 10-01-1932, MRN [id number]  PCP: Glenda Chroman., MD  Primary Cardiologist: Rozann Lesches, MD   Chief Complaint  Patient presents with  . Cardiomyopathy  . Atrial Fibrillation    History of Present Illness: Alejandro [name] is an 81 y.o. male last seen in August. He presents today with his wife for a follow-up visit. He states that he has been doing well, not hospitalized in the interim, compliant with his medications. He reports NYHA class II dyspnea, no chest pain or palpitations.  We reviewed his medications. He has had intolerance to beta blockers previously, we kept him off Aldactone and ACE inhibitor after our last encounter with an episode of acute renal failure in the setting of dehydration. His most recent echocardiogram from January showed near normalization of LVEF. He is due for a follow-up lab work.  Implantable loop recorder remains in place. He has had no significant arrhythmias or pauses documented recently.   Past Medical History  Diagnosis Date  . Essential hypertension   . Secondary cardiomyopathy ([place])     LVEF 40-45% January 2014  . Paroxysmal atrial fibrillation ([name])   . NSVT (nonsustained ventricular tachycardia) ([name])     Negative EP study  . Syncope   . CAD (coronary artery disease)     Moderate multivessel March 2016  . TIA (transient ischemic attack)   . Memory loss     Current Outpatient Prescriptions  Medication Sig Dispense Refill  . amiodarone (PACERONE) 200 MG tablet Take 1 tablet (200 mg total) by mouth daily. In the morning 30 tablet 6  . atorvastatin (LIPITOR) 20 MG tablet Take 1 tablet (20 mg total) by mouth daily at 6 PM. 30 tablet 6  . edoxaban (SAVAYSA) 60 MG TABS tablet Take 60 mg by mouth daily. 30 tablet 11   No current facility-administered medications for this visit.    Allergies:  Review of patient's allergies indicates no known allergies.   Social  History: The patient  reports that he has never smoked. He has never used smokeless tobacco. He reports that he does not drink alcohol or use illicit drugs.   ROS:  Please see the history of present illness. Otherwise, complete review of systems is positive for none.  All other systems are reviewed and negative.   Physical Exam: VS:  BP 128/80 mmHg  Pulse 57  Ht 5\' 8"  (1.727 m)  Wt 188 lb 12.8 oz (85.639 kg)  BMI 28.71 kg/m2  SpO2 97%, BMI Body mass index is 28.71 kg/(m^2).  Wt Readings from Last 3 Encounters:  04/21/15 188 lb 12.8 oz (85.639 kg)  01/01/15 180 lb (81.647 kg)  12/25/14 179 lb (81.194 kg)     General: Patient appears comfortable at rest. HEENT: Conjunctiva and lids normal, oropharynx clear with moist mucosa. Neck: Supple, no elevated JVP or carotid bruits, no thyromegaly. Lungs: Clear to auscultation, nonlabored breathing at rest. Cardiac: Regular rate and rhythm, no S3 or significant systolic murmur, no pericardial rub. Abdomen: Soft, nontender, no hepatomegaly, bowel sounds present, no guarding or rebound. Extremities: No pitting edema, distal pulses 2+.   ECG: ECG is not ordered today.   Recent Labwork: 08/02/2014: B Natriuretic Peptide 285.1* 08/13/2014: Magnesium 1.8 12/25/2014: ALT 21; AST 39; Hemoglobin 14.3; Platelets 221; TSH 2.888 12/26/2014: BUN 24*; Creatinine, Ser 1.64*; Potassium 4.1; Sodium 140     Component Value Date/Time   CHOL 138 08/03/2014 0304   TRIG  73 08/03/2014 0304   HDL 33* 08/03/2014 0304   CHOLHDL 4.2 08/03/2014 0304   VLDL 15 08/03/2014 0304   LDLCALC 90 08/03/2014 0304    Other Studies Reviewed Today:  Echocardiogram 06/06/2014: Study Conclusions  - Left ventricle: The cavity size was normal. Wall thickness was increased in a pattern of mild LVH. Systolic function was normal. The estimated ejection fraction was in the range of 50% to 55%. Probable hypokinesis of the basal-midinferolateral myocardium. Doppler  parameters are consistent with abnormal left ventricular relaxation (grade 1 diastolic dysfunction). - Aortic valve: Trileaflet; mildly calcified leaflets width Lambl&'s excrescences. There was trivial regurgitation. - Aortic root: The aortic root was mildly ectatic. - Mitral valve: Calcified annulus. There was trivial regurgitation. - Right atrium: Central venous pressure (est): 3 mm Hg. - Tricuspid valve: There was trivial regurgitation. - Pulmonary arteries: PA peak pressure: 27 mm Hg (S). - Pericardium, extracardiac: There was no pericardial effusion.  Impressions:  - Mild LVH with LVEF approximately 50-55% in the setting of frequent ventricular ectopy. There is mid to basal inferolateral hypokinesis and grade 1 diastolic dysfunction. Sclerotic aortic valve with trivial aortic regurgitation. Mildly ectatic aortic root. Trivial tricuspid regurgitation with PASP 27 mmHg.  Assessment and Plan:  1. Paroxysmal atrial fibrillation, maintaining sinus rhythm on amiodarone. Stroke prophylaxis continues with Savaysa. Follow-up CBC, CMET, and TSH.  2. History of nonischemic cardiomyopathy with most recent LVEF 50-55%.  3. Moderate, multivessel CAD without active angina.  Current medicines were reviewed with the patient today.   Orders Placed This Encounter  Procedures  . Comprehensive metabolic panel  . CBC  . TSH    Disposition: FU with me in 4 months.   Signed, Satira Sark, MD, [facility] 04/21/2015 3:36 PM    [facility] at [place], [place], [place] [address] Phone: [phone]; Fax: [phone]

## 2015-04-21 NOTE — Patient Instructions (Signed)
Your physician recommends that you continue on your current medications as directed. Please refer to the Current Medication list given to you today. Your physician recommends that you return for lab work today to check your CMET, CBC and TSH. Your physician recommends that you schedule a follow-up appointment in: 4 months.

## 2015-04-23 ENCOUNTER — Telehealth: Payer: Self-pay | Admitting: *Deleted

## 2015-04-23 NOTE — Telephone Encounter (Signed)
Patient informed via message machine. 

## 2015-04-23 NOTE — Telephone Encounter (Signed)
-----   Message from Satira Sark, MD sent at 04/23/2015 10:19 AM EST ----- Reviewed. Renal function normal, potassium normal, LFTs and TSH normal. Hemoglobin 13.4 and platelets 221. Continue current regimen.

## 2015-05-02 ENCOUNTER — Ambulatory Visit (INDEPENDENT_AMBULATORY_CARE_PROVIDER_SITE_OTHER): Payer: Medicare Other | Admitting: *Deleted

## 2015-05-02 DIAGNOSIS — R55 Syncope and collapse: Secondary | ICD-10-CM

## 2015-05-04 LAB — CUP PACEART REMOTE DEVICE CHECK: MDC IDC SESS DTM: 20161102220759

## 2015-05-04 NOTE — Progress Notes (Signed)
Carelink summary report received. Battery status OK. Normal device function. No new symptom episodes, tachy episodes, brady, or pause episodes. No new AF episodes. Monthly summary reports and ROV with JA PRN. 

## 2015-05-05 NOTE — Progress Notes (Signed)
Carelink Summary Report / Loop Recorder 

## 2015-05-06 ENCOUNTER — Telehealth: Payer: Self-pay | Admitting: *Deleted

## 2015-05-06 NOTE — Telephone Encounter (Signed)
[facility] requesting call back.  Need to know if patient was symptomatic with pause episode on Carelink transmission from 05/01/15.

## 2015-05-07 LAB — CUP PACEART REMOTE DEVICE CHECK: MDC IDC SESS DTM: 20161202050500

## 2015-05-07 NOTE — Progress Notes (Signed)
Carelink summary report received. Battery status OK. Normal device function. No new symptom, tachy, brady, or AF episodes. 1 pause episode, duration ~6 sec, nocturnal, LMOM requesting call back regarding any symptoms. Monthly summary reports and ROV with JA PRN.

## 2015-05-15 NOTE — Telephone Encounter (Signed)
Able to reach patient.  He states he was asymptomatic with pause episode on 05/01/15.  Had patient send manual transmission to reconnect Carelink monitor.  He states he sleeps next to the monitor each night, but that he gets up around 3am to 4am some mornings to sleep in his chair.  This is likely interrupting nightly automatic transmissions.  Advised patient that I will call him to instruct him on next steps if monitor continues to miss transmissions.  May need to move monitor next to chair or may need to reprogram nightly transmission time to an earlier hour.  Patient is appreciative of call and denies questions or concerns at this time.  He is aware to call with worsening symptoms or questions.

## 2015-05-22 ENCOUNTER — Encounter: Payer: Self-pay | Admitting: Internal Medicine

## 2015-06-03 ENCOUNTER — Ambulatory Visit (INDEPENDENT_AMBULATORY_CARE_PROVIDER_SITE_OTHER): Payer: Medicare Other | Admitting: *Deleted

## 2015-06-03 DIAGNOSIS — R55 Syncope and collapse: Secondary | ICD-10-CM | POA: Diagnosis not present

## 2015-06-03 LAB — CUP PACEART REMOTE DEVICE CHECK: Date Time Interrogation Session: 20170101230724

## 2015-06-03 NOTE — Progress Notes (Signed)
Carelink Summary Report / Loop Recorder 

## 2015-06-11 DIAGNOSIS — I10 Essential (primary) hypertension: Secondary | ICD-10-CM | POA: Diagnosis not present

## 2015-06-11 DIAGNOSIS — H6120 Impacted cerumen, unspecified ear: Secondary | ICD-10-CM | POA: Diagnosis not present

## 2015-06-11 DIAGNOSIS — Z789 Other specified health status: Secondary | ICD-10-CM | POA: Diagnosis not present

## 2015-06-11 DIAGNOSIS — Z418 Encounter for other procedures for purposes other than remedying health state: Secondary | ICD-10-CM | POA: Diagnosis not present

## 2015-06-13 ENCOUNTER — Other Ambulatory Visit: Payer: Self-pay | Admitting: Cardiology

## 2015-06-17 ENCOUNTER — Telehealth: Payer: Self-pay | Admitting: Cardiology

## 2015-06-17 DIAGNOSIS — H6120 Impacted cerumen, unspecified ear: Secondary | ICD-10-CM | POA: Diagnosis not present

## 2015-06-17 MED ORDER — RIVAROXABAN 20 MG PO TABS: 20.0000 mg | ORAL_TABLET | Freq: Every day | ORAL | Status: DC

## 2015-06-17 NOTE — Telephone Encounter (Signed)
[id number] use if not at home  Patient got a letter [employer] about edoxaban (SAVAYSA) 60 MG TABS tablet and needs to speak with nurse about it

## 2015-06-17 NOTE — Telephone Encounter (Signed)
Dr. Rayann Heman changed him from Eliquis to [facility] in the past for cost purposes. If his insurance will better cover Xarelto, this switch should be fine. He would need to switch to Xarelto 20 mg daily.

## 2015-06-17 NOTE — Telephone Encounter (Signed)
Spoke with wife and she verbalized understanding of plan.

## 2015-06-17 NOTE — Telephone Encounter (Signed)
Pt has new insurance who says they will not cover savaysa (unless exception letter sent by doctor) they are requesting to switch pt to Xarelto. Will forward to Dr. Domenic Polite

## 2015-07-01 ENCOUNTER — Ambulatory Visit (INDEPENDENT_AMBULATORY_CARE_PROVIDER_SITE_OTHER): Payer: Medicare Other | Admitting: *Deleted

## 2015-07-01 DIAGNOSIS — R55 Syncope and collapse: Secondary | ICD-10-CM | POA: Diagnosis not present

## 2015-07-02 NOTE — Progress Notes (Signed)
Carelink Summary Report / Loop Recorder 

## 2015-07-10 DIAGNOSIS — Z0000 Encounter for general adult medical examination without abnormal findings: Secondary | ICD-10-CM | POA: Diagnosis not present

## 2015-07-10 DIAGNOSIS — Z1211 Encounter for screening for malignant neoplasm of colon: Secondary | ICD-10-CM | POA: Diagnosis not present

## 2015-07-10 DIAGNOSIS — R5383 Other fatigue: Secondary | ICD-10-CM | POA: Diagnosis not present

## 2015-07-10 DIAGNOSIS — Z6827 Body mass index (BMI) 27.0-27.9, adult: Secondary | ICD-10-CM | POA: Diagnosis not present

## 2015-07-10 DIAGNOSIS — Z418 Encounter for other procedures for purposes other than remedying health state: Secondary | ICD-10-CM | POA: Diagnosis not present

## 2015-07-10 DIAGNOSIS — Z1389 Encounter for screening for other disorder: Secondary | ICD-10-CM | POA: Diagnosis not present

## 2015-07-10 DIAGNOSIS — I10 Essential (primary) hypertension: Secondary | ICD-10-CM | POA: Diagnosis not present

## 2015-07-10 DIAGNOSIS — E538 Deficiency of other specified B group vitamins: Secondary | ICD-10-CM | POA: Diagnosis not present

## 2015-07-10 DIAGNOSIS — Z125 Encounter for screening for malignant neoplasm of prostate: Secondary | ICD-10-CM | POA: Diagnosis not present

## 2015-07-14 NOTE — Progress Notes (Signed)
Carelink summary report received. Battery status OK. Normal device function. No new symptom episodes, tachy episodes, brady, or pause episodes. No new AF episodes. Monthly summary reports and ROV/PRN 

## 2015-07-16 ENCOUNTER — Telehealth: Payer: Self-pay | Admitting: *Deleted

## 2015-07-16 NOTE — Telephone Encounter (Signed)
Per wife, patient was told to contact our office because of patient having joint problems and thinking it may be caused by Lipitor.

## 2015-07-17 NOTE — Telephone Encounter (Signed)
Spoke with wife and informed her that its okay to hold the lipitor for 2 weeks to see if his symptoms improve. Wife advised if symptoms improve its possible that the lipitor would have caused it. Nurse advised wife to call office back in 2 weeks to report symptoms.

## 2015-07-31 ENCOUNTER — Ambulatory Visit (INDEPENDENT_AMBULATORY_CARE_PROVIDER_SITE_OTHER): Payer: Medicare Other | Admitting: *Deleted

## 2015-07-31 DIAGNOSIS — R55 Syncope and collapse: Secondary | ICD-10-CM | POA: Diagnosis not present

## 2015-08-01 NOTE — Progress Notes (Signed)
Carelink Summary Report / Loop Recorder 

## 2015-08-05 LAB — CUP PACEART REMOTE DEVICE CHECK: Date Time Interrogation Session: 20170131230551

## 2015-08-05 NOTE — Progress Notes (Signed)
Carelink summary report received. Battery status OK. Normal device function. No new symptom episodes, tachy episodes, brady, or pause episodes. No new AF episodes. Monthly summary reports and ROV/PRN 

## 2015-08-08 LAB — CUP PACEART REMOTE DEVICE CHECK: Date Time Interrogation Session: 20170302230654

## 2015-08-08 NOTE — Progress Notes (Signed)
Carelink summary report received. Battery status OK. Normal device function. No new symptom episodes, tachy episodes, brady, or pause episodes. No new AF episodes. Monthly summary reports and ROV/PRN 

## 2015-08-15 ENCOUNTER — Telehealth: Payer: Self-pay | Admitting: *Deleted

## 2015-08-15 ENCOUNTER — Encounter: Payer: Self-pay | Admitting: Internal Medicine

## 2015-08-15 NOTE — Telephone Encounter (Signed)
Called patient to request that he send a manual Carelink transmission from his home monitor.  Walked patient through process and transmission was successfully received.  Noted pause episode on 07/26/15 around 6:15am.  Patient reports that he generally wakes up between 5:00am and 6:00am but that he was asymptomatic with this episode.  He is aware to call with symptoms in the future.  Patient is appreciative of call and denies questions or concerns at this time.

## 2015-08-21 DIAGNOSIS — H35373 Puckering of macula, bilateral: Secondary | ICD-10-CM | POA: Diagnosis not present

## 2015-08-21 DIAGNOSIS — H04123 Dry eye syndrome of bilateral lacrimal glands: Secondary | ICD-10-CM | POA: Diagnosis not present

## 2015-08-21 DIAGNOSIS — Z961 Presence of intraocular lens: Secondary | ICD-10-CM | POA: Diagnosis not present

## 2015-08-21 DIAGNOSIS — H35342 Macular cyst, hole, or pseudohole, left eye: Secondary | ICD-10-CM | POA: Diagnosis not present

## 2015-08-26 ENCOUNTER — Ambulatory Visit (INDEPENDENT_AMBULATORY_CARE_PROVIDER_SITE_OTHER): Payer: Medicare Other | Admitting: Cardiology

## 2015-08-26 ENCOUNTER — Encounter: Payer: Self-pay | Admitting: Cardiology

## 2015-08-26 VITALS — BP 99/67 | HR 63 | Ht 68.0 in | Wt 191.6 lb

## 2015-08-26 DIAGNOSIS — I10 Essential (primary) hypertension: Secondary | ICD-10-CM | POA: Diagnosis not present

## 2015-08-26 DIAGNOSIS — I429 Cardiomyopathy, unspecified: Secondary | ICD-10-CM

## 2015-08-26 DIAGNOSIS — I2510 Atherosclerotic heart disease of native coronary artery without angina pectoris: Secondary | ICD-10-CM

## 2015-08-26 DIAGNOSIS — I480 Paroxysmal atrial fibrillation: Secondary | ICD-10-CM | POA: Diagnosis not present

## 2015-08-26 DIAGNOSIS — Z789 Other specified health status: Secondary | ICD-10-CM

## 2015-08-26 DIAGNOSIS — Z889 Allergy status to unspecified drugs, medicaments and biological substances status: Secondary | ICD-10-CM

## 2015-08-26 MED ORDER — AMIODARONE HCL 100 MG PO TABS: 100.0000 mg | ORAL_TABLET | Freq: Every day | ORAL | Status: DC

## 2015-08-26 NOTE — Progress Notes (Signed)
Cardiology Office Note  Date: 08/26/2015   ID: Alejandro [name], DOB [birth year], MRN [id number]  PCP: Glenda Chroman., MD  Primary Cardiologist: Rozann Lesches, MD   Chief Complaint  Patient presents with  . Cardiomyopathy  . PAF    History of Present Illness: Alejandro [name] is an 82 y.o. male last seen in November 2016. He is here today with his wife for a follow-up visit. Reports no problems with palpitations or chest pain. Describes mild orthostatic symptoms and also trouble with his balance. Blood pressure is in the low normal range which could be contributing, although amiodarone is also a potential culprit. He has had good rhythm control however.  He has had intolerance to beta blockers previously, we have also kept him off Aldactone and ACE inhibitor following episode of acute renal failure in the setting of dehydration. His current regimen includes amiodarone and Xarelto. He does not report any bleeding problems. I reviewed his most recent lab work as outlined below.  He came off Lipitor describing a feeling of muscle weakness, has been off about a month and it is not entirely clear whether it was related or not. Did not notice any improvement with CoQ10. We discussed stopping both medications for now.  Past Medical History  Diagnosis Date  . Essential hypertension   . Secondary cardiomyopathy ([place])     LVEF 40-45% January 2014  . Paroxysmal atrial fibrillation ([name])   . NSVT (nonsustained ventricular tachycardia) ([name])     Negative EP study  . Syncope   . CAD (coronary artery disease)     Moderate multivessel March 2016  . TIA (transient ischemic attack)   . Memory loss     Past Surgical History  Procedure Laterality Date  . Laparoscopic appendectomy  November 2015    Dr. [name]  . Left heart catheterization with coronary angiogram N/A 07/31/2014    Procedure: LEFT HEART CATHETERIZATION WITH CORONARY ANGIOGRAM;  Surgeon: [name], MD;  Location: [place] CATH LAB;   Service: Cardiovascular;  Laterality: N/A;  . Electrophysiology study N/A 08/05/2014    Procedure: ELECTROPHYSIOLOGY STUDY;  Surgeon: Deboraha Sprang, MD;  Location: [facility] CATH LAB;  Service: Cardiovascular;  Laterality: N/A;  . Colonoscopy  unknown  . Colonoscopy N/A 09/05/2014    Procedure: COLONOSCOPY;  Surgeon: [name], MD;  [facility];  Service: Endoscopy;  Laterality: N/A;  145pm  . Cataract extraction      Current Outpatient Prescriptions  Medication Sig Dispense Refill  . amiodarone (PACERONE) 200 MG tablet TAKE 1 TABLET BY MOUTH EVERY MORNING. 30 tablet 6  . Cyanocobalamin (VITAMIN B-12) 1000 MCG SUBL Place under the tongue 2 (two) times daily.    . rivaroxaban (XARELTO) 20 MG TABS tablet Take 1 tablet (20 mg total) by mouth daily with supper. 30 tablet 6  . atorvastatin (LIPITOR) 20 MG tablet Take 1 tablet (20 mg total) by mouth daily at 6 PM. (Patient not taking: Reported on 08/26/2015) 30 tablet 6   No current facility-administered medications for this visit.   Allergies:  Review of patient's allergies indicates no known allergies.   Social History: The patient  reports that he has never smoked. He has never used smokeless tobacco. He reports that he does not drink alcohol or use illicit drugs.   ROS:  Please see the history of present illness. Otherwise, complete review of systems is positive for mild orthostasis and balance trouble, no syncope. Memory problems. All other systems are  reviewed and negative.   Physical Exam: VS:  BP 99/67 mmHg  Pulse 63  Ht 5\' 8"  (1.727 m)  Wt 191 lb 9.6 oz (86.909 kg)  BMI 29.14 kg/m2  SpO2 96%, BMI Body mass index is 29.14 kg/(m^2).  Wt Readings from Last 3 Encounters:  08/26/15 191 lb 9.6 oz (86.909 kg)  04/21/15 188 lb 12.8 oz (85.639 kg)  01/01/15 180 lb (81.647 kg)    General: Patient appears comfortable at rest. HEENT: Conjunctiva and lids normal, oropharynx clear with moist mucosa. Neck: Supple, no elevated JVP or  carotid bruits, no thyromegaly. Lungs: Clear to auscultation, nonlabored breathing at rest. Cardiac: Regular rate and rhythm, no S3 or significant systolic murmur, no pericardial rub. Abdomen: Soft, nontender, no hepatomegaly, bowel sounds present, no guarding or rebound. Extremities: No pitting edema, distal pulses 2+. Skin: Warm and dry. Musculoskeletal: No kyphosis. Neuropsychiatric: Alert and oriented 3, affect appropriate.  ECG: I personally reviewed the prior tracing from 12/25/2014 which showed sinus rhythm with prolonged PR interval, poor R-wave progression, left anterior fascicular block.  Recent Labwork: 12/25/2014: ALT 21; AST 39; Hemoglobin 14.3; Platelets 221; TSH 2.888 12/26/2014: BUN 24*; Creatinine, Ser 1.64*; Potassium 4.1; Sodium 140     Component Value Date/Time   CHOL 138 08/03/2014 0304   TRIG 73 08/03/2014 0304   HDL 33* 08/03/2014 0304   CHOLHDL 4.2 08/03/2014 0304   VLDL 15 08/03/2014 0304   LDLCALC 90 08/03/2014 0304  November 2016: BUN 15, creatinine 1.0, AST 26, ALT 12, TSH 3.0, potassium 4.0, hemoglobin 13.5, platelets 221 February 2017: Hemoglobin 13.2, platelets 275, cholesterol 99, triglycerides 78, HDL 45, LDL 38, BUN 18, creatinine 1.2, potassium 4.5, AST 37, ALT 20  Other Studies Reviewed Today:  Echocardiogram 06/06/2014: Study Conclusions  - Left ventricle: The cavity size was normal. Wall thickness was increased in a pattern of mild LVH. Systolic function was normal. The estimated ejection fraction was in the range of 50% to 55%. Probable hypokinesis of the basal-midinferolateral myocardium. Doppler parameters are consistent with abnormal left ventricular relaxation (grade 1 diastolic dysfunction). - Aortic valve: Trileaflet; mildly calcified leaflets width Lambl&'s excrescences. There was trivial regurgitation. - Aortic root: The aortic root was mildly ectatic. - Mitral valve: Calcified annulus. There was trivial regurgitation. -  Right atrium: Central venous pressure (est): 3 mm Hg. - Tricuspid valve: There was trivial regurgitation. - Pulmonary arteries: PA peak pressure: 27 mm Hg (S). - Pericardium, extracardiac: There was no pericardial effusion.  Impressions:  - Mild LVH with LVEF approximately 50-55% in the setting of frequent ventricular ectopy. There is mid to basal inferolateral hypokinesis and grade 1 diastolic dysfunction. Sclerotic aortic valve with trivial aortic regurgitation. Mildly ectatic aortic root. Trivial tricuspid regurgitation with PASP 27 mmHg.  Chest x-ray 08/12/2014: FINDINGS: The heart size and mediastinal contours are within normal limits. Both lungs are clear. The visualized skeletal structures are unremarkable.  IMPRESSION: No active cardiopulmonary disease.  Assessment and Plan:  1. Paroxysmal atrial fibrillation with good rhythm control on amiodarone. We will reduce amiodarone dose to 100 mg daily to see if this helps with any of his balance problems. Otherwise continue Xarelto for stroke prophylaxis.  2. Moderate, nonobstructive CAD by cardiac catheterization in March 2016. He has intolerance to beta blocker and was taken off ACE inhibitor due to acute renal failure. No angina symptoms.  3. Essential hypertension by history, blood pressure is low normal today. He is currently not on any antihypertensive medications.  4. History of medication intolerances  and general preference to limit medical therapy.  5. Possible statin intolerance, we are stopping Lipitor.  6. History of nonischemic cardiomyopathy, last LVEF 50-55%.  Current medicines were reviewed with the patient today.  Disposition: FU with me in 6 months.   Signed, Satira Sark, MD, [facility] 08/26/2015 9:13 AM    [place] at [place], [place], [place] [address] Phone: [phone]; Fax: [phone]

## 2015-08-26 NOTE — Patient Instructions (Signed)
Your physician has recommended you make the following change in your medication:  Stop atorvastatin. Stop CO Q 10.  Decrease amiodarone to 100 mg daily. You may break your 200 mg tablet in half daily until they are finished.  Continue all other medications the same. Your physician recommends that you schedule a follow-up appointment in: 6 months. You will receive a reminder letter in the mail in about 4 months reminding you to call and schedule your appointment. If you don't receive this letter, please contact our office.

## 2015-09-01 ENCOUNTER — Ambulatory Visit (INDEPENDENT_AMBULATORY_CARE_PROVIDER_SITE_OTHER): Payer: Medicare Other | Admitting: *Deleted

## 2015-09-01 DIAGNOSIS — R55 Syncope and collapse: Secondary | ICD-10-CM

## 2015-09-01 NOTE — Progress Notes (Signed)
Carelink Summary Report / Loop Recorder 

## 2015-09-29 ENCOUNTER — Ambulatory Visit (INDEPENDENT_AMBULATORY_CARE_PROVIDER_SITE_OTHER): Payer: Medicare Other | Admitting: *Deleted

## 2015-09-29 DIAGNOSIS — R55 Syncope and collapse: Secondary | ICD-10-CM | POA: Diagnosis not present

## 2015-09-30 NOTE — Progress Notes (Signed)
Carelink Summary Report / Loop Recorder 

## 2015-10-14 DIAGNOSIS — F0390 Unspecified dementia without behavioral disturbance: Secondary | ICD-10-CM | POA: Diagnosis not present

## 2015-10-14 DIAGNOSIS — Z299 Encounter for prophylactic measures, unspecified: Secondary | ICD-10-CM | POA: Diagnosis not present

## 2015-10-17 ENCOUNTER — Telehealth: Payer: Self-pay | Admitting: Cardiology

## 2015-10-17 NOTE — Telephone Encounter (Signed)
Spoke w/ pt and requested that he send a manual transmission b/c his home monitor has not updated in at least 14 days.   

## 2015-10-25 LAB — CUP PACEART REMOTE DEVICE CHECK: Date Time Interrogation Session: 20170401230745

## 2015-10-25 NOTE — Progress Notes (Signed)
Carelink summary report received. Battery status OK. Normal device function. No new symptom episodes, tachy episodes, brady episodes. No new AF episodes. 2 nocturnal pauses. Monthly summary reports and ROV/PRN

## 2015-10-29 ENCOUNTER — Ambulatory Visit (INDEPENDENT_AMBULATORY_CARE_PROVIDER_SITE_OTHER): Payer: Medicare Other | Admitting: *Deleted

## 2015-10-29 DIAGNOSIS — R55 Syncope and collapse: Secondary | ICD-10-CM | POA: Diagnosis not present

## 2015-10-30 NOTE — Progress Notes (Signed)
Carelink Summary Report / Loop Recorder 

## 2015-11-07 LAB — CUP PACEART REMOTE DEVICE CHECK: Date Time Interrogation Session: 20170501233826

## 2015-11-21 DIAGNOSIS — R42 Dizziness and giddiness: Secondary | ICD-10-CM | POA: Diagnosis not present

## 2015-11-21 DIAGNOSIS — Z8673 Personal history of transient ischemic attack (TIA), and cerebral infarction without residual deficits: Secondary | ICD-10-CM | POA: Diagnosis not present

## 2015-11-21 DIAGNOSIS — R5383 Other fatigue: Secondary | ICD-10-CM | POA: Diagnosis not present

## 2015-11-21 DIAGNOSIS — G319 Degenerative disease of nervous system, unspecified: Secondary | ICD-10-CM | POA: Diagnosis not present

## 2015-11-21 DIAGNOSIS — R531 Weakness: Secondary | ICD-10-CM | POA: Diagnosis not present

## 2015-11-28 ENCOUNTER — Ambulatory Visit (INDEPENDENT_AMBULATORY_CARE_PROVIDER_SITE_OTHER): Payer: Medicare Other | Admitting: *Deleted

## 2015-11-28 DIAGNOSIS — R55 Syncope and collapse: Secondary | ICD-10-CM | POA: Diagnosis not present

## 2015-12-01 NOTE — Progress Notes (Signed)
Carelink Summary Report / Loop Recorder 

## 2015-12-04 DIAGNOSIS — I429 Cardiomyopathy, unspecified: Secondary | ICD-10-CM | POA: Diagnosis not present

## 2015-12-04 DIAGNOSIS — I639 Cerebral infarction, unspecified: Secondary | ICD-10-CM | POA: Diagnosis not present

## 2015-12-06 ENCOUNTER — Other Ambulatory Visit: Payer: Self-pay | Admitting: Cardiology

## 2015-12-10 LAB — CUP PACEART REMOTE DEVICE CHECK: MDC IDC SESS DTM: 20170601000541

## 2015-12-18 LAB — CUP PACEART REMOTE DEVICE CHECK: MDC IDC SESS DTM: 20170701000843

## 2015-12-29 ENCOUNTER — Ambulatory Visit (INDEPENDENT_AMBULATORY_CARE_PROVIDER_SITE_OTHER): Payer: Medicare Other | Admitting: *Deleted

## 2015-12-29 DIAGNOSIS — R55 Syncope and collapse: Secondary | ICD-10-CM | POA: Diagnosis not present

## 2015-12-29 NOTE — Progress Notes (Signed)
Carelink Summary Report / Loop Recorder 

## 2016-01-06 LAB — CUP PACEART REMOTE DEVICE CHECK: MDC IDC SESS DTM: 20170731001055

## 2016-01-27 ENCOUNTER — Ambulatory Visit (INDEPENDENT_AMBULATORY_CARE_PROVIDER_SITE_OTHER): Payer: Medicare Other | Admitting: *Deleted

## 2016-01-27 DIAGNOSIS — R55 Syncope and collapse: Secondary | ICD-10-CM | POA: Diagnosis not present

## 2016-01-28 NOTE — Progress Notes (Signed)
Carelink Summary Report / Loop Recorder 

## 2016-02-09 DIAGNOSIS — M1990 Unspecified osteoarthritis, unspecified site: Secondary | ICD-10-CM | POA: Diagnosis not present

## 2016-02-09 DIAGNOSIS — R5383 Other fatigue: Secondary | ICD-10-CM | POA: Diagnosis not present

## 2016-02-09 DIAGNOSIS — F0390 Unspecified dementia without behavioral disturbance: Secondary | ICD-10-CM | POA: Diagnosis not present

## 2016-02-09 DIAGNOSIS — I429 Cardiomyopathy, unspecified: Secondary | ICD-10-CM | POA: Diagnosis not present

## 2016-02-09 DIAGNOSIS — I10 Essential (primary) hypertension: Secondary | ICD-10-CM | POA: Diagnosis not present

## 2016-02-21 LAB — CUP PACEART REMOTE DEVICE CHECK: Date Time Interrogation Session: 20170830003714

## 2016-02-21 NOTE — Progress Notes (Signed)
Carelink summary report received. Battery status OK. Normal device function. No new symptom episodes, tachy episodes, brady, or pause episodes. No new AF episodes. Monthly summary reports and ROV/PRN 

## 2016-02-23 ENCOUNTER — Encounter: Payer: Self-pay | Admitting: *Deleted

## 2016-02-24 ENCOUNTER — Encounter: Payer: Self-pay | Admitting: Cardiology

## 2016-02-24 ENCOUNTER — Ambulatory Visit (INDEPENDENT_AMBULATORY_CARE_PROVIDER_SITE_OTHER): Payer: Medicare Other | Admitting: Cardiology

## 2016-02-24 VITALS — BP 107/70 | HR 59 | Ht 68.0 in | Wt 192.8 lb

## 2016-02-24 DIAGNOSIS — I480 Paroxysmal atrial fibrillation: Secondary | ICD-10-CM | POA: Diagnosis not present

## 2016-02-24 DIAGNOSIS — I429 Cardiomyopathy, unspecified: Secondary | ICD-10-CM

## 2016-02-24 DIAGNOSIS — I2510 Atherosclerotic heart disease of native coronary artery without angina pectoris: Secondary | ICD-10-CM | POA: Diagnosis not present

## 2016-02-24 MED ORDER — AMIODARONE HCL 200 MG PO TABS: 100.0000 mg | ORAL_TABLET | Freq: Every day | ORAL | 3 refills | Status: DC

## 2016-02-24 NOTE — Patient Instructions (Addendum)
Your physician wants you to follow-up in: 6 months with Dr. Ferne Reus will receive a reminder letter in the mail two months in advance. If you don't receive a letter, please call our office to schedule the follow-up appointment.  Your physician recommends that you continue on your current medications as directed. Please refer to the Current Medication list given to you today.  TAKE AMIODARONE 100 MG DAILY  Thank you for choosing [facility]!!

## 2016-02-24 NOTE — Progress Notes (Signed)
Cardiology Office Note  Date: 02/24/2016   ID: Alejandro [name], DOB May 01, 1933, MRN [id number]  PCP: Glenda Chroman, MD  Primary Cardiologist: Rozann Lesches, MD   Chief Complaint  Patient presents with  . PAF    History of Present Illness: Alejandro [name] is an 82 y.o. male last seen in March. He is here today with his wife for a follow-up visit. Complains of fatigue and general, no palpitations or chest pain. He did not reduce his amiodarone to 100 mg daily which we recommended at the last visit. They realized this recently.  I reviewed his ECG today which shows sinus rhythm with prolonged PR interval, leftward axis, decreased R wave progression, nonspecific T-wave changes.  He does not report any bleeding problems on Xarelto.  Past Medical History:  Diagnosis Date  . CAD (coronary artery disease)    Moderate multivessel March 2016  . Essential hypertension   . Memory loss   . NSVT (nonsustained ventricular tachycardia) ([name])    Negative EP study  . Paroxysmal atrial fibrillation ([name])   . Secondary cardiomyopathy ([place])    LVEF 40-45% January 2014  . Syncope   . TIA (transient ischemic attack)     Current Outpatient Prescriptions  Medication Sig Dispense Refill  . amiodarone (PACERONE) 200 MG tablet Take 0.5 tablets (100 mg total) by mouth daily. 45 tablet 3  . Cyanocobalamin (VITAMIN B-12) 1000 MCG SUBL Place under the tongue 2 (two) times daily.    Alveda Reasons 20 MG TABS tablet TAKE 1 TABLET DAILY WITH SUPPER --STOP SAVAYSA. 30 tablet 3   No current facility-administered medications for this visit.    Allergies:  Review of patient's allergies indicates no known allergies.   Social History: The patient  reports that he has never smoked. He has never used smokeless tobacco. He reports that he does not drink alcohol or use drugs.   ROS:  Please see the history of present illness. Otherwise, complete review of systems is positive for none.  All other systems are reviewed  and negative.   Physical Exam: VS:  BP 107/70   Pulse (!) 59   Ht 5\' 8"  (1.727 m)   Wt 192 lb 12.8 oz (87.5 kg)   SpO2 93%   BMI 29.32 kg/m , BMI Body mass index is 29.32 kg/m.  Wt Readings from Last 3 Encounters:  02/24/16 192 lb 12.8 oz (87.5 kg)  08/26/15 191 lb 9.6 oz (86.9 kg)  04/21/15 188 lb 12.8 oz (85.6 kg)    General: Patient appears comfortable at rest. HEENT: Conjunctiva and lids normal, oropharynx clear with moist mucosa. Neck: Supple, no elevated JVP or carotid bruits, no thyromegaly. Lungs: Clear to auscultation, nonlabored breathing at rest. Cardiac: Regular rate and rhythm, no S3 or significant systolic murmur, no pericardial rub. Abdomen: Soft, nontender, no hepatomegaly, bowel sounds present, no guarding or rebound. Extremities: No pitting edema, distal pulses 2+.  ECG: I personally reviewed the tracing from 12/25/2014 which showed sinus rhythm with prolonged PR interval, poor R wave progression, and left anterior fascicular block  Recent Labwork:  September 2017: Hemoglobin 13.9, platelets 247, TSH 3.09 July 2015: Cholesterol 99, triglycerides 78, HDL 45, LDL 38, BUN 18, creatinine 1.2, potassium 4.5, AST 37, ALT 20  Other Studies Reviewed Today:  Echocardiogram 06/06/2014: Study Conclusions  - Left ventricle: The cavity size was normal. Wall thickness was increased in a pattern of mild LVH. Systolic function was normal. The estimated ejection fraction was in the  range of 50% to 55%. Probable hypokinesis of the basal-midinferolateral myocardium. Doppler parameters are consistent with abnormal left ventricular relaxation (grade 1 diastolic dysfunction). - Aortic valve: Trileaflet; mildly calcified leaflets width Lambl&'s excrescences. There was trivial regurgitation. - Aortic root: The aortic root was mildly ectatic. - Mitral valve: Calcified annulus. There was trivial regurgitation. - Right atrium: Central venous pressure (est): 3 mm  Hg. - Tricuspid valve: There was trivial regurgitation. - Pulmonary arteries: PA peak pressure: 27 mm Hg (S). - Pericardium, extracardiac: There was no pericardial effusion.  Impressions:  - Mild LVH with LVEF approximately 50-55% in the setting of frequent ventricular ectopy. There is mid to basal inferolateral hypokinesis and grade 1 diastolic dysfunction. Sclerotic aortic valve with trivial aortic regurgitation. Mildly ectatic aortic root. Trivial tricuspid regurgitation with PASP 27 mmHg.  Assessment and Plan:  1. Paroxysmal atrial fibrillation, maintaining sinus rhythm on amiodarone and Xarelto. Cut amiodarone dose back to 100 mg daily. Recent lab work reviewed above.  2. Moderate, nonobstructive CAD as of cardiac catheterization 2016. No active angina symptoms.  3. History of cardiomyopathy, last LVEF 50-55% range.  Current medicines were reviewed with the patient today.   Orders Placed This Encounter  Procedures  . EKG 12-Lead    Disposition: Follow-up with me in 6 months.  Signed, Satira Sark, MD, [facility] 02/24/2016 4:45 PM    [place] at [place], [place], [place] [address] Phone: [phone]; Fax: [phone]

## 2016-02-26 ENCOUNTER — Ambulatory Visit (INDEPENDENT_AMBULATORY_CARE_PROVIDER_SITE_OTHER): Payer: Medicare Other | Admitting: *Deleted

## 2016-02-26 DIAGNOSIS — R55 Syncope and collapse: Secondary | ICD-10-CM | POA: Diagnosis not present

## 2016-02-27 NOTE — Progress Notes (Signed)
Carelink Summary Report / Loop Recorder 

## 2016-03-09 DIAGNOSIS — Z713 Dietary counseling and surveillance: Secondary | ICD-10-CM | POA: Diagnosis not present

## 2016-03-09 DIAGNOSIS — R079 Chest pain, unspecified: Secondary | ICD-10-CM | POA: Diagnosis not present

## 2016-03-09 DIAGNOSIS — R0789 Other chest pain: Secondary | ICD-10-CM | POA: Diagnosis not present

## 2016-03-09 DIAGNOSIS — Z6830 Body mass index (BMI) 30.0-30.9, adult: Secondary | ICD-10-CM | POA: Diagnosis not present

## 2016-03-09 DIAGNOSIS — R0989 Other specified symptoms and signs involving the circulatory and respiratory systems: Secondary | ICD-10-CM | POA: Diagnosis not present

## 2016-03-29 ENCOUNTER — Ambulatory Visit (INDEPENDENT_AMBULATORY_CARE_PROVIDER_SITE_OTHER): Payer: Medicare Other | Admitting: *Deleted

## 2016-03-29 DIAGNOSIS — R55 Syncope and collapse: Secondary | ICD-10-CM | POA: Diagnosis not present

## 2016-03-30 NOTE — Progress Notes (Signed)
Carelink Summary Report / Loop Recorder 

## 2016-04-02 ENCOUNTER — Telehealth: Payer: Self-pay | Admitting: *Deleted

## 2016-04-02 NOTE — Telephone Encounter (Signed)
Spoke with patient regarding ~3 sec pause episode on ILR from 03/28/16.  Patient denies any symptoms with episode.  He reports that he "gives out easily", but this is unchanged recently.  Advised patient to call our office (after seeking emergency medical attention) if he has a syncopal episode in the future.  He verbalizes understanding of instructions and denies questions or concerns at this time.

## 2016-04-05 DIAGNOSIS — I429 Cardiomyopathy, unspecified: Secondary | ICD-10-CM | POA: Diagnosis not present

## 2016-04-05 DIAGNOSIS — I10 Essential (primary) hypertension: Secondary | ICD-10-CM | POA: Diagnosis not present

## 2016-04-05 NOTE — Progress Notes (Signed)
Cardiology Office Note  Date: 04/06/2016   ID: Alejandro [name], DOB 05-01-1933, MRN [id number]  PCP: Glenda Chroman, MD  Primary Cardiologist: Rozann Lesches, MD   Chief Complaint  Patient presents with  . Concerns about blood pressure    History of Present Illness: Alejandro [name] is an 82 y.o. male last seen in September. He presents today with his wife for a follow-up visit. He has been concerned after getting a call recently about an interrogation of his implantable loop recorder. He saw his PCP, under the impression that his blood pressure had been fluctuating. He was noted to be hypertensive at his visit which is typically not the case.  Patient has an implantable loop recorder in place, follows with Dr. Rayann Heman. Recent telephone note reviewed, patient had approximately 3 second pause documented from October 29, no reported syncope however. He had received a telephone call related to this pause, which as it turns out was asymptomatic, and plan is to simply continue with observation.  At the present time he is not on any specific antihypertensives, has in fact had intolerances to these in the past. He is on low-dose Amiodarone which should not be having any significant impact on his blood pressure.  Past Medical History:  Diagnosis Date  . CAD (coronary artery disease)    Moderate multivessel March 2016  . Essential hypertension   . Memory loss   . NSVT (nonsustained ventricular tachycardia) ([name])    Negative EP study  . Paroxysmal atrial fibrillation ([name])   . Secondary cardiomyopathy ([place])    LVEF 40-45% January 2014  . Syncope   . TIA (transient ischemic attack)     Past Surgical History:  Procedure Laterality Date  . CATARACT EXTRACTION    . COLONOSCOPY  unknown  . COLONOSCOPY N/A 09/05/2014   Procedure: COLONOSCOPY;  Surgeon: [name], MD;  [facility];  Service: Endoscopy;  Laterality: N/A;  145pm  . ELECTROPHYSIOLOGY STUDY N/A 08/05/2014   Procedure: ELECTROPHYSIOLOGY STUDY;  Surgeon: Deboraha Sprang, MD;  Location: [facility] CATH LAB;  Service: Cardiovascular;  Laterality: N/A;  . LAPAROSCOPIC APPENDECTOMY  November 2015   Dr. [name]  . LEFT HEART CATHETERIZATION WITH CORONARY ANGIOGRAM N/A 07/31/2014   Procedure: LEFT HEART CATHETERIZATION WITH CORONARY ANGIOGRAM;  Surgeon: [name], MD;  Location: [place] CATH LAB;  Service: Cardiovascular;  Laterality: N/A;    Current Outpatient Prescriptions  Medication Sig Dispense Refill  . amiodarone (PACERONE) 200 MG tablet Take 0.5 tablets (100 mg total) by mouth daily. 45 tablet 3  . Cyanocobalamin (VITAMIN B-12) 1000 MCG SUBL Place under the tongue 2 (two) times daily.    Alveda Reasons 20 MG TABS tablet TAKE 1 TABLET DAILY WITH SUPPER --STOP SAVAYSA. 30 tablet 3   No current facility-administered medications for this visit.    Allergies:  Patient has no known allergies.   Social History: The patient  reports that he has never smoked. He has never used smokeless tobacco. He reports that he does not drink alcohol or use drugs.   Family History: The patient's family history includes Alzheimer's disease in his mother; Arthritis in his father; Heart disease in his father.   ROS:  Please see the history of present illness. Otherwise, complete review of systems is positive for decreased hearing.  All other systems are reviewed and negative.   Physical Exam: VS:  BP (!) 152/90   Pulse 67   Ht 5\' 8"  (1.727 m)  Wt 191 lb (86.6 kg)   SpO2 97%   BMI 29.04 kg/m , BMI Body mass index is 29.04 kg/m.  Wt Readings from Last 3 Encounters:  04/06/16 191 lb (86.6 kg)  02/24/16 192 lb 12.8 oz (87.5 kg)  08/26/15 191 lb 9.6 oz (86.9 kg)    General: Patient appears comfortable at rest. HEENT: Conjunctiva and lids normal, oropharynx clear with moist mucosa. Neck: Supple, no elevated JVP or carotid bruits, no thyromegaly. Lungs: Clear to auscultation, nonlabored breathing at rest. Cardiac: Regular  rate and rhythm, no S3 or significant systolic murmur, no pericardial rub. Abdomen: Soft, nontender, no hepatomegaly, bowel sounds present, no guarding or rebound. Extremities: No pitting edema, distal pulses 2+. Skin: Warm and dry. Musculoskeletal: No kyphosis. Neuropsychiatric: Alert and oriented 3, affect appropriate.  ECG: I personally reviewed the tracing from 02/24/2016 which showed sinus rhythm with prolonged PR interval, leftward axis, nonspecific T-wave changes.  Recent Labwork:  September 2017: Hemoglobin 13.9, platelets 247, TSH 3.81  Other Studies Reviewed Today:  Echocardiogram 06/06/2014: Study Conclusions  - Left ventricle: The cavity size was normal. Wall thickness was increased in a pattern of mild LVH. Systolic function was normal. The estimated ejection fraction was in the range of 50% to 55%. Probable hypokinesis of the basal-midinferolateral myocardium. Doppler parameters are consistent with abnormal left ventricular relaxation (grade 1 diastolic dysfunction). - Aortic valve: Trileaflet; mildly calcified leaflets width Lambl&'s excrescences. There was trivial regurgitation. - Aortic root: The aortic root was mildly ectatic. - Mitral valve: Calcified annulus. There was trivial regurgitation. - Right atrium: Central venous pressure (est): 3 mm Hg. - Tricuspid valve: There was trivial regurgitation. - Pulmonary arteries: PA peak pressure: 27 mm Hg (S). - Pericardium, extracardiac: There was no pericardial effusion.  Impressions:  - Mild LVH with LVEF approximately 50-55% in the setting of frequent ventricular ectopy. There is mid to basal inferolateral hypokinesis and grade 1 diastolic dysfunction. Sclerotic aortic valve with trivial aortic regurgitation. Mildly ectatic aortic root. Trivial tricuspid regurgitation with PASP 27 mmHg.  Assessment and Plan:  1. Recent elevation in blood pressure. At last visit he had a low-normal blood  pressure, and has been taken off of prior antihypertensives over the last year which included lisinopril and Aldactone, used in the setting of cardiomyopathy. I spoke with the patient and his wife today, will get him to check blood pressure daily for at least the next week and then let us know the results. If trend is truly staying up, we may try to put him on a low-dose ARB.  2. Implantable loop recorder in place, recent asymptomatic 3 second pause noted. Continue observation for now.  3. Paroxysmal atrial fibrillation, continuing on low-dose amiodarone as well as Xarelto. He has had no palpitations.  4. History of nonischemic cardiomyopathy, LVEF was 50-55% by follow-up echocardiogram last year.  Current medicines were reviewed with the patient today.   Disposition: Follow-up in 6 weeks.  Signed, Satira Sark, MD, [facility] 04/06/2016 11:47 AM    [place] at [place], [place], [place] [address] Phone: [phone]; Fax: [phone]

## 2016-04-06 ENCOUNTER — Ambulatory Visit (INDEPENDENT_AMBULATORY_CARE_PROVIDER_SITE_OTHER): Payer: Medicare Other | Admitting: Cardiology

## 2016-04-06 ENCOUNTER — Encounter: Payer: Self-pay | Admitting: Cardiology

## 2016-04-06 VITALS — BP 152/90 | HR 67 | Ht 68.0 in | Wt 191.0 lb

## 2016-04-06 DIAGNOSIS — I429 Cardiomyopathy, unspecified: Secondary | ICD-10-CM

## 2016-04-06 DIAGNOSIS — R030 Elevated blood-pressure reading, without diagnosis of hypertension: Secondary | ICD-10-CM | POA: Diagnosis not present

## 2016-04-06 DIAGNOSIS — I480 Paroxysmal atrial fibrillation: Secondary | ICD-10-CM | POA: Diagnosis not present

## 2016-04-06 DIAGNOSIS — I495 Sick sinus syndrome: Secondary | ICD-10-CM | POA: Diagnosis not present

## 2016-04-06 DIAGNOSIS — I2510 Atherosclerotic heart disease of native coronary artery without angina pectoris: Secondary | ICD-10-CM

## 2016-04-06 NOTE — Patient Instructions (Signed)
Medication Instructions:  Continue all current medications.  Labwork: none  Testing/Procedures: none  Follow-Up: 6 weeks   Any Other Special Instructions Will Be Listed Below (If Applicable). Your physician has requested that you regularly monitor and record your blood pressure readings x 1 week.  Call office with readings for MD review.   If you need a refill on your cardiac medications before your next appointment, please call your pharmacy.

## 2016-04-09 LAB — CUP PACEART REMOTE DEVICE CHECK
Implantable Pulse Generator Implant Date: 20160307
MDC IDC SESS DTM: 20170929003836

## 2016-04-09 NOTE — Progress Notes (Signed)
Carelink summary report received. Battery status OK. Normal device function. No new symptom episodes, tachy episodes, brady, or pause episodes. No new AF episodes. Monthly summary reports and ROV/PRN 

## 2016-04-26 ENCOUNTER — Ambulatory Visit (INDEPENDENT_AMBULATORY_CARE_PROVIDER_SITE_OTHER): Payer: Medicare Other | Admitting: *Deleted

## 2016-04-26 DIAGNOSIS — R55 Syncope and collapse: Secondary | ICD-10-CM | POA: Diagnosis not present

## 2016-04-27 NOTE — Progress Notes (Signed)
Carelink Summary Report / Loop Recorder 

## 2016-05-03 LAB — CUP PACEART REMOTE DEVICE CHECK
Date Time Interrogation Session: 20171029023718
MDC IDC PG IMPLANT DT: 20160307

## 2016-05-03 NOTE — Progress Notes (Signed)
Carelink summary report received. Battery status OK. Normal device function. No new symptom episodes, tachy episodes, brady, or pause episodes. No new AF episodes. Monthly summary reports and ROV/PRN 

## 2016-05-06 DIAGNOSIS — M79602 Pain in left arm: Secondary | ICD-10-CM | POA: Diagnosis not present

## 2016-05-06 DIAGNOSIS — M79601 Pain in right arm: Secondary | ICD-10-CM | POA: Diagnosis not present

## 2016-05-06 DIAGNOSIS — M25511 Pain in right shoulder: Secondary | ICD-10-CM | POA: Diagnosis not present

## 2016-05-06 DIAGNOSIS — R001 Bradycardia, unspecified: Secondary | ICD-10-CM | POA: Diagnosis not present

## 2016-05-06 DIAGNOSIS — M25521 Pain in right elbow: Secondary | ICD-10-CM | POA: Diagnosis not present

## 2016-05-06 DIAGNOSIS — S299XXA Unspecified injury of thorax, initial encounter: Secondary | ICD-10-CM | POA: Diagnosis not present

## 2016-05-06 DIAGNOSIS — I4891 Unspecified atrial fibrillation: Secondary | ICD-10-CM | POA: Diagnosis not present

## 2016-05-07 DIAGNOSIS — S299XXA Unspecified injury of thorax, initial encounter: Secondary | ICD-10-CM | POA: Diagnosis not present

## 2016-05-07 DIAGNOSIS — M25511 Pain in right shoulder: Secondary | ICD-10-CM | POA: Diagnosis not present

## 2016-05-07 DIAGNOSIS — Z299 Encounter for prophylactic measures, unspecified: Secondary | ICD-10-CM | POA: Diagnosis not present

## 2016-05-15 ENCOUNTER — Other Ambulatory Visit: Payer: Self-pay | Admitting: Cardiology

## 2016-05-17 DIAGNOSIS — M1990 Unspecified osteoarthritis, unspecified site: Secondary | ICD-10-CM | POA: Diagnosis not present

## 2016-05-17 DIAGNOSIS — I429 Cardiomyopathy, unspecified: Secondary | ICD-10-CM | POA: Diagnosis not present

## 2016-05-17 DIAGNOSIS — Z299 Encounter for prophylactic measures, unspecified: Secondary | ICD-10-CM | POA: Diagnosis not present

## 2016-05-17 DIAGNOSIS — N400 Enlarged prostate without lower urinary tract symptoms: Secondary | ICD-10-CM | POA: Diagnosis not present

## 2016-05-17 DIAGNOSIS — F0390 Unspecified dementia without behavioral disturbance: Secondary | ICD-10-CM | POA: Diagnosis not present

## 2016-05-26 ENCOUNTER — Ambulatory Visit (INDEPENDENT_AMBULATORY_CARE_PROVIDER_SITE_OTHER): Payer: Medicare Other | Admitting: *Deleted

## 2016-05-26 ENCOUNTER — Encounter: Payer: Self-pay | Admitting: Cardiology

## 2016-05-26 ENCOUNTER — Encounter: Payer: Self-pay | Admitting: *Deleted

## 2016-05-26 ENCOUNTER — Ambulatory Visit (INDEPENDENT_AMBULATORY_CARE_PROVIDER_SITE_OTHER): Payer: Medicare Other | Admitting: Cardiology

## 2016-05-26 VITALS — BP 130/84 | HR 67 | Ht 68.0 in | Wt 187.0 lb

## 2016-05-26 DIAGNOSIS — I428 Other cardiomyopathies: Secondary | ICD-10-CM

## 2016-05-26 DIAGNOSIS — I480 Paroxysmal atrial fibrillation: Secondary | ICD-10-CM

## 2016-05-26 DIAGNOSIS — R55 Syncope and collapse: Secondary | ICD-10-CM | POA: Diagnosis not present

## 2016-05-26 DIAGNOSIS — I2510 Atherosclerotic heart disease of native coronary artery without angina pectoris: Secondary | ICD-10-CM

## 2016-05-26 NOTE — Progress Notes (Signed)
Cardiology Office Note  Date: 05/26/2016   ID: Alejandro [name], DOB 12-22-1932, MRN [id number]  PCP: Glenda Chroman, MD  Primary Cardiologist: Rozann Lesches, MD   Chief Complaint  Patient presents with  . Coronary Artery Disease  . History of cardiomyopathy    History of Present Illness: Alejandro [name] is an 82 y.o. male last seen in November. He presents for a follow-up visit. Reports no palpitations or syncope since last encounter. States that his blood pressure has not been fluctuating as much. He does tell me that he tripped over an extension cord while he was putting up Christmas decorations outdoors, went to the ER at [facility]. We are requesting the records for review.  Blood pressure is in better range today. He is not on any antihypertensives at this time.  Recent ILR interrogation noted.  Past Medical History:  Diagnosis Date  . CAD (coronary artery disease)    Moderate multivessel March 2016  . Essential hypertension   . Memory loss   . NSVT (nonsustained ventricular tachycardia) ([name])    Negative EP study  . Paroxysmal atrial fibrillation ([name])   . Secondary cardiomyopathy ([place])    LVEF 40-45% January 2014  . Syncope   . TIA (transient ischemic attack)     Past Surgical History:  Procedure Laterality Date  . CATARACT EXTRACTION    . COLONOSCOPY  unknown  . COLONOSCOPY N/A 09/05/2014   Procedure: COLONOSCOPY;  Surgeon: [name], MD;  [facility];  Service: Endoscopy;  Laterality: N/A;  145pm  . ELECTROPHYSIOLOGY STUDY N/A 08/05/2014   Procedure: ELECTROPHYSIOLOGY STUDY;  Surgeon: Deboraha Sprang, MD;  Location: [facility] CATH LAB;  Service: Cardiovascular;  Laterality: N/A;  . LAPAROSCOPIC APPENDECTOMY  November 2015   Dr. [name]  . LEFT HEART CATHETERIZATION WITH CORONARY ANGIOGRAM N/A 07/31/2014   Procedure: LEFT HEART CATHETERIZATION WITH CORONARY ANGIOGRAM;  Surgeon: [name], MD;  Location: [facility] CATH LAB;  Service: Cardiovascular;   Laterality: N/A;    Current Outpatient Prescriptions  Medication Sig Dispense Refill  . amiodarone (PACERONE) 200 MG tablet Take 0.5 tablets (100 mg total) by mouth daily. 45 tablet 3  . Cyanocobalamin (VITAMIN B-12) 1000 MCG SUBL Place under the tongue 2 (two) times daily.    Alveda Reasons 20 MG TABS tablet TAKE 1 TABLET DAILY WITH SUPPER----(STOP SAVAYSA). 30 tablet 6   No current facility-administered medications for this visit.    Allergies:  Patient has no known allergies.   Social History: The patient  reports that he has never smoked. He has never used smokeless tobacco. He reports that he does not drink alcohol or use drugs.   ROS:  Please see the history of present illness. Otherwise, complete review of systems is positive for arthritic pains.  All other systems are reviewed and negative.   Physical Exam: VS:  BP 130/84   Pulse 67   Ht 5\' 8"  (1.727 m)   Wt 187 lb (84.8 kg)   BMI 28.43 kg/m , BMI Body mass index is 28.43 kg/m.  Wt Readings from Last 3 Encounters:  05/26/16 187 lb (84.8 kg)  04/06/16 191 lb (86.6 kg)  02/24/16 192 lb 12.8 oz (87.5 kg)    General: Patient appears comfortable at rest. HEENT: Conjunctiva and lids normal, oropharynx clear with moist mucosa. Neck: Supple, no elevated JVP or carotid bruits, no thyromegaly. Lungs: Clear to auscultation, nonlabored breathing at rest. Cardiac: Regular rate and rhythm, no S3 or significant systolic  murmur, no pericardial rub. Abdomen: Soft, nontender, no hepatomegaly, bowel sounds present, no guarding or rebound. Extremities: No pitting edema, distal pulses 2+.  ECG: I personally reviewed the tracing from 02/24/2016 which showed sinus rhythm with prolonged PR interval, leftward axis, nonspecific T-wave changes.  Recent Labwork:    Component Value Date/Time   CHOL 138 08/03/2014 0304   TRIG 73 08/03/2014 0304   HDL 33 (L) 08/03/2014 0304   CHOLHDL 4.2 08/03/2014 0304   VLDL 15 08/03/2014 0304   LDLCALC 90  08/03/2014 0304    Other Studies Reviewed Today:  Echocardiogram 06/06/2014: Study Conclusions  - Left ventricle: The cavity size was normal. Wall thickness was increased in a pattern of mild LVH. Systolic function was normal. The estimated ejection fraction was in the range of 50% to 55%. Probable hypokinesis of the basal-midinferolateral myocardium. Doppler parameters are consistent with abnormal left ventricular relaxation (grade 1 diastolic dysfunction). - Aortic valve: Trileaflet; mildly calcified leaflets width Lambl&'s excrescences. There was trivial regurgitation. - Aortic root: The aortic root was mildly ectatic. - Mitral valve: Calcified annulus. There was trivial regurgitation. - Right atrium: Central venous pressure (est): 3 mm Hg. - Tricuspid valve: There was trivial regurgitation. - Pulmonary arteries: PA peak pressure: 27 mm Hg (S). - Pericardium, extracardiac: There was no pericardial effusion.  Impressions:  - Mild LVH with LVEF approximately 50-55% in the setting of frequent ventricular ectopy. There is mid to basal inferolateral hypokinesis and grade 1 diastolic dysfunction. Sclerotic aortic valve with trivial aortic regurgitation. Mildly ectatic aortic root. Trivial tricuspid regurgitation with PASP 27 mmHg.  Assessment and Plan:  1. History of nonischemic cardiomyopathy with LVEF up to 50-55% range by echocardiogram last year. He has preferred to simplify his medical regimen over time.  2. Blood pressure reasonable range today. He prefers to stay off of antihypertensives at this point.  3. Implantable loop recorder in place, no syncopal events.  4. Paroxysmal atrial fibrillation, continues on low-dose amiodarone and Xarelto.  Current medicines were reviewed with the patient today.   Dsposition: Follow-up in 4 months.   Signed, Satira Sark, MD, [facility] 05/26/2016 1:49 PM    [facility] [place], [place], [place] [address] Phone: [phone]; Fax: [phone]

## 2016-05-26 NOTE — Patient Instructions (Signed)

## 2016-05-27 NOTE — Progress Notes (Signed)
Carelink Summary Report / Loop Recorder 

## 2016-06-05 LAB — CUP PACEART REMOTE DEVICE CHECK
Date Time Interrogation Session: 20171128033840
Implantable Pulse Generator Implant Date: 20160307

## 2016-06-05 NOTE — Progress Notes (Signed)
Carelink summary report received. Battery status OK. Normal device function. No new symptom episodes, tachy episodes, or brady episodes. No new AF episodes. 1 pause- ECG appears 3 second pause, previously contact, pt denied symptoms per note in EPIC. Monthly summary reports and ROV/PRN

## 2016-06-25 ENCOUNTER — Ambulatory Visit (INDEPENDENT_AMBULATORY_CARE_PROVIDER_SITE_OTHER): Payer: Medicare Other | Admitting: *Deleted

## 2016-06-25 DIAGNOSIS — R55 Syncope and collapse: Secondary | ICD-10-CM

## 2016-06-28 NOTE — Progress Notes (Signed)
Carelink Summary Report / Loop Recorder 

## 2016-07-08 LAB — CUP PACEART REMOTE DEVICE CHECK
Date Time Interrogation Session: 20171228043754
MDC IDC PG IMPLANT DT: 20160307

## 2016-07-09 ENCOUNTER — Telehealth: Payer: Self-pay | Admitting: *Deleted

## 2016-07-09 NOTE — Telephone Encounter (Signed)
[facility] requesting call back, gave [facility] phone number.  Will discuss any symptoms with increased AF burden over past few days.

## 2016-07-12 NOTE — Telephone Encounter (Signed)
Follow Up    Pt returning phone call from Friday.

## 2016-07-12 NOTE — Telephone Encounter (Signed)
Spoke with patient and wife.  He was asymptomatic with AF episodes from 2/8-2/10/18.  Patient and wife report that he is still taking amiodarone 100mg  daily and Xarelto 20mg  daily.  Patient and wife agree to call the [facility] in the future if patient develops any symptoms with AF episodes, including ShOB, palpitations, dizziness, etc.  They are appreciative of call and deny additional questions or concerns at this time.

## 2016-07-16 DIAGNOSIS — Z0000 Encounter for general adult medical examination without abnormal findings: Secondary | ICD-10-CM | POA: Diagnosis not present

## 2016-07-16 DIAGNOSIS — Z7189 Other specified counseling: Secondary | ICD-10-CM | POA: Diagnosis not present

## 2016-07-16 DIAGNOSIS — Z79899 Other long term (current) drug therapy: Secondary | ICD-10-CM | POA: Diagnosis not present

## 2016-07-16 DIAGNOSIS — K5732 Diverticulitis of large intestine without perforation or abscess without bleeding: Secondary | ICD-10-CM | POA: Diagnosis not present

## 2016-07-16 DIAGNOSIS — I639 Cerebral infarction, unspecified: Secondary | ICD-10-CM | POA: Diagnosis not present

## 2016-07-16 DIAGNOSIS — E663 Overweight: Secondary | ICD-10-CM | POA: Diagnosis not present

## 2016-07-16 DIAGNOSIS — Z125 Encounter for screening for malignant neoplasm of prostate: Secondary | ICD-10-CM | POA: Diagnosis not present

## 2016-07-16 DIAGNOSIS — R5383 Other fatigue: Secondary | ICD-10-CM | POA: Diagnosis not present

## 2016-07-16 DIAGNOSIS — I429 Cardiomyopathy, unspecified: Secondary | ICD-10-CM | POA: Diagnosis not present

## 2016-07-16 DIAGNOSIS — Z299 Encounter for prophylactic measures, unspecified: Secondary | ICD-10-CM | POA: Diagnosis not present

## 2016-07-16 DIAGNOSIS — E7800 Pure hypercholesterolemia, unspecified: Secondary | ICD-10-CM | POA: Diagnosis not present

## 2016-07-16 DIAGNOSIS — Z1389 Encounter for screening for other disorder: Secondary | ICD-10-CM | POA: Diagnosis not present

## 2016-07-16 DIAGNOSIS — N400 Enlarged prostate without lower urinary tract symptoms: Secondary | ICD-10-CM | POA: Diagnosis not present

## 2016-07-16 DIAGNOSIS — Z1211 Encounter for screening for malignant neoplasm of colon: Secondary | ICD-10-CM | POA: Diagnosis not present

## 2016-07-23 ENCOUNTER — Encounter: Payer: Self-pay | Admitting: Internal Medicine

## 2016-07-26 ENCOUNTER — Ambulatory Visit (INDEPENDENT_AMBULATORY_CARE_PROVIDER_SITE_OTHER): Payer: Medicare Other | Admitting: *Deleted

## 2016-07-26 DIAGNOSIS — R55 Syncope and collapse: Secondary | ICD-10-CM | POA: Diagnosis not present

## 2016-07-26 LAB — CUP PACEART REMOTE DEVICE CHECK
Date Time Interrogation Session: 20180127043823
MDC IDC PG IMPLANT DT: 20160307

## 2016-07-26 NOTE — Progress Notes (Signed)
Carelink Summary Report / Loop Recorder 

## 2016-08-06 ENCOUNTER — Ambulatory Visit (INDEPENDENT_AMBULATORY_CARE_PROVIDER_SITE_OTHER): Payer: Medicare Other | Admitting: Gastroenterology

## 2016-08-06 ENCOUNTER — Encounter: Payer: Self-pay | Admitting: Gastroenterology

## 2016-08-06 DIAGNOSIS — R195 Other fecal abnormalities: Secondary | ICD-10-CM

## 2016-08-06 NOTE — Progress Notes (Signed)
Primary Care Physician:  Glenda Chroman, MD  Primary Gastroenterologist:  Garfield Cornea, MD   Chief Complaint  Patient presents with  . Blood In Stools    HPI:  Alejandro [name] is a 83 y.o. male here At the request of PCP for further evaluation of heme positive stool. Patient had a routine physical last month, Hemoccult came back positive. CBC unremarkable as outlined below. Clinically he denies any GI symptoms. Denies constipation, diarrhea, melena, rectal bleeding, heartburn, dysphagia, vomiting, unintentional weight loss. Denies nosebleeds. Colonoscopy is up-to-date, April 2016., He had engorged internal hemorrhoids, scattered left-sided diverticulosis. No future surveillance colonoscopy recommended.  Current Outpatient Prescriptions  Medication Sig Dispense Refill  . amiodarone (PACERONE) 200 MG tablet Take 0.5 tablets (100 mg total) by mouth daily. 45 tablet 3  . Cyanocobalamin (VITAMIN B-12) 1000 MCG SUBL Place under the tongue 2 (two) times daily.    Alveda Reasons 20 MG TABS tablet TAKE 1 TABLET DAILY WITH SUPPER----(STOP SAVAYSA). 30 tablet 6   No current facility-administered medications for this visit.     Allergies as of 08/06/2016  . (No Known Allergies)    Past Medical History:  Diagnosis Date  . CAD (coronary artery disease)    Moderate multivessel March 2016  . Essential hypertension   . Memory loss   . NSVT (nonsustained ventricular tachycardia) ([name])    Negative EP study  . Paroxysmal atrial fibrillation ([name])   . Secondary cardiomyopathy ([place])    LVEF 40-45% January 2014  . Syncope   . TIA (transient ischemic attack)     Past Surgical History:  Procedure Laterality Date  . CATARACT EXTRACTION    . COLONOSCOPY  unknown  . COLONOSCOPY N/A 09/05/2014   Procedure: COLONOSCOPY;  Surgeon: [name], MD;  [facility];  Service: Endoscopy;  Laterality: N/A;  145pm  . ELECTROPHYSIOLOGY STUDY N/A 08/05/2014   Procedure: ELECTROPHYSIOLOGY STUDY;  Surgeon:  Deboraha Sprang, MD;  Location: [facility] CATH LAB;  Service: Cardiovascular;  Laterality: N/A;  . LAPAROSCOPIC APPENDECTOMY  November 2015   Dr. [name]  . LEFT HEART CATHETERIZATION WITH CORONARY ANGIOGRAM N/A 07/31/2014   Procedure: LEFT HEART CATHETERIZATION WITH CORONARY ANGIOGRAM;  Surgeon: [name], MD;  Location: [facility] CATH LAB;  Service: Cardiovascular;  Laterality: N/A;  . LOOP RECORDER IMPLANT      Family History  Problem Relation Age of Onset  . Heart disease Father     Diagnosed in his 22s  . Arthritis Father   . Alzheimer's disease Mother     Social History   Social History  . Marital status: Married    Spouse name: N/A  . Number of children: N/A  . Years of education: N/A   Occupational History  . Retired    Social History Main Topics  . Smoking status: Never Smoker  . Smokeless tobacco: Never Used  . Alcohol use No  . Drug use: No  . Sexual activity: Not on file   Other Topics Concern  . Not on file   Social History Narrative  . No narrative on file      ROS:  General: Negative for anorexia, weight loss, fever, chills, fatigue, weakness. Eyes: Negative for vision changes.  ENT: Negative for hoarseness, difficulty swallowing , nasal congestion. CV: Negative for chest pain, angina, palpitations, dyspnea on exertion, peripheral edema.  Respiratory: Negative for dyspnea at rest, dyspnea on exertion, cough, sputum, wheezing.  GI: See history of present illness. GU:  Negative for dysuria,  hematuria, urinary incontinence, urinary frequency, nocturnal urination.  MS: Negative for joint pain, low back pain.  Derm: Negative for rash or itching.  Neuro: Negative for weakness, abnormal sensation, seizure, frequent headaches, memory loss, confusion.  Psych: Negative for anxiety, depression, suicidal ideation, hallucinations.  Endo: Negative for unusual weight change.  Heme: Negative for bruising or bleeding. Allergy: Negative for rash or hives.    Physical  Examination:  BP 117/79   Pulse 67   Temp 98.3 F (36.8 C) (Oral)   Ht 5\' 8"  (1.727 m)   Wt 184 lb 6.4 oz (83.6 kg)   BMI 28.04 kg/m    General: Well-nourished, well-developed in no acute distress.  Head: Normocephalic, atraumatic.   Eyes: Conjunctiva pink, no icterus. Mouth: Oropharyngeal mucosa moist and pink , no lesions erythema or exudate. Neck: Supple without thyromegaly, masses, or lymphadenopathy.  Lungs: Clear to auscultation bilaterally.  Heart: Regular rate and rhythm, no murmurs rubs or gallops.  Abdomen: Bowel sounds are normal, nontender, nondistended, no hepatosplenomegaly or masses, no abdominal bruits or    hernia , no rebound or guarding.   Rectal: Not performed Extremities: No lower extremity edema. No clubbing or deformities.  Neuro: Alert and oriented x 4 , grossly normal neurologically.  Skin: Warm and dry, no rash or jaundice.   Psych: Alert and cooperative, normal mood and affect.  Labs: Labs 07/21/2016 white blood cell count 7200, hemoglobin 13.5, hematocrit 40.4, MCV 97, platelets 2 44,000 BUN 21, creatinine 1.44, total bilirubin 0.7, alkaline phosphatase 57, AST 20, ALT 11, albumin 3.9.  Imaging Studies: No results found.

## 2016-08-06 NOTE — Patient Instructions (Signed)
1. We will call you next week and let you know what Dr. Gala Romney advises regarding your positive stool test. Please call us if you see rectal bleeding, black stools or have abdominal pain, vomiting, frequent heartburn, etc.

## 2016-08-06 NOTE — Progress Notes (Signed)
cc'ed to pcp °

## 2016-08-06 NOTE — Assessment & Plan Note (Signed)
83 year old gentleman on chronic anticoagulation for history of A. fib who presents for further evaluation of Hemoccult-positive stool discovered at time of routine physical. No GI symptoms. Hemoglobin is normal. Colonoscopy up-to-date as outlined.  At this time, it is unclear if any further workup is needed outside of monitoring his hemoglobin for evidence of chronic occult GI bleeding. If he had decline in his hemoglobin, further workup would be recommended a potential upper endoscopy as a next step. However hemoglobin remains normal no further workup may be needed as Hemoccult positive stool could be explained by hemorrhoids especially in the setting of anticoagulation. I will discuss further with Dr. Gala Romney and obtain his recommendations.

## 2016-08-09 ENCOUNTER — Telehealth: Payer: Self-pay | Admitting: Cardiology

## 2016-08-09 NOTE — Telephone Encounter (Signed)
amiodarone (PACERONE) 200 MG tablet  Has questions about medication

## 2016-08-09 NOTE — Telephone Encounter (Signed)
Wife concerned about side effects listed with this medication and wanted to know if patient really needed to be taking this medication since it had several side effects. Wife advised that most medications do have side effects and the drug companies are required to list the side effects for patients information. Wife also advised that most medications have side effects and if the benefits outweigh the risk, its better to take the medications and contact office if side effects occur. Wife verbalized understanding of plan.

## 2016-08-10 NOTE — Progress Notes (Signed)
Please let patient know that I discussed heme positive stool with RMR. Given he is up to date on colonoscopy, has h/o large hemorrhoids which likely explain heme + stool, he has normal Hgb, no GI symptoms otherwise, Dr. Gala Romney advises conservative approach. No need for colonoscopy or other endoscopy evaluations at this time.   Would recommend CBC in 3 months to check for chronic occult blood loss.

## 2016-08-11 LAB — CUP PACEART REMOTE DEVICE CHECK
Date Time Interrogation Session: 20180226050726
MDC IDC PG IMPLANT DT: 20160307

## 2016-08-12 ENCOUNTER — Other Ambulatory Visit: Payer: Self-pay | Admitting: Internal Medicine

## 2016-08-18 NOTE — Progress Notes (Signed)
Tried to call pt- NA 

## 2016-08-24 DIAGNOSIS — H35033 Hypertensive retinopathy, bilateral: Secondary | ICD-10-CM | POA: Diagnosis not present

## 2016-08-24 DIAGNOSIS — H35373 Puckering of macula, bilateral: Secondary | ICD-10-CM | POA: Diagnosis not present

## 2016-08-24 DIAGNOSIS — Z961 Presence of intraocular lens: Secondary | ICD-10-CM | POA: Diagnosis not present

## 2016-08-24 DIAGNOSIS — H35342 Macular cyst, hole, or pseudohole, left eye: Secondary | ICD-10-CM | POA: Diagnosis not present

## 2016-08-25 ENCOUNTER — Ambulatory Visit (INDEPENDENT_AMBULATORY_CARE_PROVIDER_SITE_OTHER): Payer: Medicare Other | Admitting: *Deleted

## 2016-08-25 DIAGNOSIS — R55 Syncope and collapse: Secondary | ICD-10-CM

## 2016-08-26 NOTE — Progress Notes (Signed)
Carelink Summary Report / Loop Recorder 

## 2016-08-27 ENCOUNTER — Emergency Department (HOSPITAL_COMMUNITY)
Admission: EM | Admit: 2016-08-27 | Discharge: 2016-08-28 | Disposition: A | Discharge status: Home / Self Care | Payer: Medicare Other | Attending: Emergency Medicine | Admitting: Emergency Medicine

## 2016-08-27 ENCOUNTER — Encounter (HOSPITAL_COMMUNITY): Payer: Self-pay | Admitting: Emergency Medicine

## 2016-08-27 ENCOUNTER — Emergency Department (HOSPITAL_COMMUNITY): Payer: Medicare Other

## 2016-08-27 DIAGNOSIS — S0990XA Unspecified injury of head, initial encounter: Secondary | ICD-10-CM | POA: Diagnosis not present

## 2016-08-27 DIAGNOSIS — R55 Syncope and collapse: Secondary | ICD-10-CM | POA: Insufficient documentation

## 2016-08-27 DIAGNOSIS — S299XXA Unspecified injury of thorax, initial encounter: Secondary | ICD-10-CM | POA: Diagnosis not present

## 2016-08-27 DIAGNOSIS — R2689 Other abnormalities of gait and mobility: Secondary | ICD-10-CM | POA: Insufficient documentation

## 2016-08-27 DIAGNOSIS — I2510 Atherosclerotic heart disease of native coronary artery without angina pectoris: Secondary | ICD-10-CM | POA: Diagnosis not present

## 2016-08-27 DIAGNOSIS — I10 Essential (primary) hypertension: Secondary | ICD-10-CM | POA: Diagnosis not present

## 2016-08-27 LAB — BASIC METABOLIC PANEL
Anion gap: 7 (ref 5–15)
BUN: 21 mg/dL — AB (ref 6–20)
CO2: 27 mmol/L (ref 22–32)
Calcium: 9.7 mg/dL (ref 8.9–10.3)
Chloride: 103 mmol/L (ref 101–111)
Creatinine, Ser: 1.43 mg/dL — ABNORMAL HIGH (ref 0.61–1.24)
GFR, EST AFRICAN AMERICAN: 51 mL/min — AB (ref >60)
GFR, EST NON AFRICAN AMERICAN: 44 mL/min — AB (ref >60)
Glucose, Bld: 101 mg/dL — ABNORMAL HIGH (ref 65–99)
POTASSIUM: 4.4 mmol/L (ref 3.5–5.1)
SODIUM: 137 mmol/L (ref 135–145)

## 2016-08-27 LAB — CBC
HEMATOCRIT: 44.9 % (ref 39.0–52.0)
Hemoglobin: 15 g/dL (ref 13.0–17.0)
MCH: 33.1 pg (ref 26.0–34.0)
MCHC: 33.4 g/dL (ref 30.0–36.0)
MCV: 99.1 fL (ref 78.0–100.0)
PLATELETS: 223 10*3/uL (ref 150–400)
RBC: 4.53 MIL/uL (ref 4.22–5.81)
RDW: 13.9 % (ref 11.5–15.5)
WBC: 9.8 10*3/uL (ref 4.0–10.5)

## 2016-08-27 LAB — CBG MONITORING, ED: GLUCOSE-CAPILLARY: 103 mg/dL — AB (ref 65–99)

## 2016-08-27 LAB — TROPONIN I
TROPONIN I: 0.03 ng/mL — AB (ref <0.03)
Troponin I: 0.03 ng/mL (ref <0.03)

## 2016-08-27 NOTE — ED Triage Notes (Signed)
Patient states he fell three times today. States dizziness off one since 0800 today. States was sent here by his PCP. Patient also states he has felt like this for a few weeks.

## 2016-08-27 NOTE — ED Provider Notes (Addendum)
[facility] DEPT Provider Note   CSN: [id number] Arrival date & time: 08/27/16  1617     History   Chief Complaint Chief Complaint  Patient presents with  . Dizziness    HPI Alejandro [name] is a 83 y.o. male.  Patient with 3 near syncopal episodes today scattered throughout the day. GERD every time patient tried to bend over. Never did pass out. Felt as if he is losing his balance. No vertigo no dizziness. No chest pain no headache no neuro focal deficits. No history of similar problems. Patient does have a history of atrial fibrillation. Patient states he was feeling fine yesterday. And doesn't kick refill bad today.      Past Medical History:  Diagnosis Date  . CAD (coronary artery disease)    Moderate multivessel March 2016  . Essential hypertension   . Memory loss   . NSVT (nonsustained ventricular tachycardia) ([name])    Negative EP study  . Paroxysmal atrial fibrillation ([name])   . Secondary cardiomyopathy ([place])    LVEF 40-45% January 2014  . Syncope   . TIA (transient ischemic attack)     Patient Active Problem List   Diagnosis Date Noted  . Heme positive stool 08/06/2016  . Orthostatic hypotension 12/25/2014  . Right sided weakness 12/25/2014  . HLD (hyperlipidemia) 12/25/2014  . Hypotension 12/25/2014  . AKI (acute kidney injury) ([place]) 12/25/2014  . A-fib ([place]) 12/25/2014  . History of colonic polyps   . Rectal bleeding 09/03/2014  . Chronic anticoagulation 09/03/2014  . Chest tightness 08/13/2014  . Chest pain 08/13/2014  . Dizziness 08/13/2014  . Episodic lightheadedness   . NSVT (nonsustained ventricular tachycardia) ([place]) 08/05/2014  . Congestive dilated cardiomyopathy ([place]) 08/05/2014  . Syncope 08/05/2014  . Atrial fibrillation with RVR ([place]) 07/30/2014  . Secondary cardiomyopathy ([place]) 06/04/2014  . Essential hypertension 05/02/2013    Past Surgical History:  Procedure Laterality Date  . CATARACT EXTRACTION    . COLONOSCOPY  unknown  .  COLONOSCOPY N/A 09/05/2014   Procedure: COLONOSCOPY;  Surgeon: [name], MD;  [facility];  Service: Endoscopy;  Laterality: N/A;  145pm  . ELECTROPHYSIOLOGY STUDY N/A 08/05/2014   Procedure: ELECTROPHYSIOLOGY STUDY;  Surgeon: Deboraha Sprang, MD;  Location: [facility] CATH LAB;  Service: Cardiovascular;  Laterality: N/A;  . LAPAROSCOPIC APPENDECTOMY  November 2015   Dr. [name]  . LEFT HEART CATHETERIZATION WITH CORONARY ANGIOGRAM N/A 07/31/2014   Procedure: LEFT HEART CATHETERIZATION WITH CORONARY ANGIOGRAM;  Surgeon: [name], MD;  Location: [facility] CATH LAB;  Service: Cardiovascular;  Laterality: N/A;  . LOOP RECORDER IMPLANT         Home Medications    Prior to Admission medications   Medication Sig Start Date End Date Taking? Authorizing Provider  amiodarone (PACERONE) 200 MG tablet Take 0.5 tablets (100 mg total) by mouth daily. 02/24/16  Yes Satira Sark, MD  XARELTO 20 MG TABS tablet TAKE 1 TABLET DAILY WITH SUPPER----(STOP SAVAYSA). Patient taking differently: TAKE 1 TABLET DAILY WITH SUPPER 05/17/16  Yes Satira Sark, MD    Family History Family History  Problem Relation Age of Onset  . Heart disease Father     Diagnosed in his 73s  . Arthritis Father   . Alzheimer's disease Mother     Social History Social History  Substance Use Topics  . Smoking status: Never Smoker  . Smokeless tobacco: Never Used  . Alcohol use No     Allergies   Patient  has no known allergies.   Review of Systems Review of Systems  Constitutional: Negative for fever.  HENT: Negative for congestion.   Eyes: Negative for photophobia and visual disturbance.  Respiratory: Negative for shortness of breath.   Cardiovascular: Negative for chest pain.  Gastrointestinal: Negative for abdominal pain.  Genitourinary: Negative for dysuria.  Musculoskeletal: Negative for back pain and neck pain.  Skin: Negative for rash.  Neurological: Negative for dizziness, syncope, speech  difficulty, weakness, numbness and headaches.  Hematological: Does not bruise/bleed easily.  Psychiatric/Behavioral: Negative for confusion.     Physical Exam Updated Vital Signs BP (!) 146/93   Pulse 61   Temp 98.9 F (37.2 C) (Oral)   Resp 18   Ht 5\' 8"  (1.727 m)   Wt 79.4 kg   SpO2 95%   BMI 26.61 kg/m   Physical Exam  Constitutional: He is oriented to person, place, and time. He appears well-developed and well-nourished. No distress.  HENT:  Head: Normocephalic and atraumatic.  Mouth/Throat: Oropharynx is clear and moist.  Eyes: Conjunctivae and EOM are normal. Pupils are equal, round, and reactive to light.  Neck: Normal range of motion.  Cardiovascular: Normal rate, regular rhythm and normal heart sounds.   Pulmonary/Chest: Effort normal and breath sounds normal.  Abdominal: Bowel sounds are normal. There is no tenderness.  Musculoskeletal: Normal range of motion.  Neurological: He is alert and oriented to person, place, and time. No cranial nerve deficit or sensory deficit. He exhibits normal muscle tone. Coordination normal.  Skin: Skin is warm. No rash noted.  Nursing note and vitals reviewed.    ED Treatments / Results  Labs (all labs ordered are listed, but only abnormal results are displayed) Labs Reviewed  BASIC METABOLIC PANEL - Abnormal; Notable for the following:       Result Value   Glucose, Bld 101 (*)    BUN 21 (*)    Creatinine, Ser 1.43 (*)    GFR calc non Af Amer 44 (*)    GFR calc Af Amer 51 (*)    All other components within normal limits  TROPONIN I - Abnormal; Notable for the following:    Troponin I 0.03 (*)    All other components within normal limits  CBG MONITORING, ED - Abnormal; Notable for the following:    Glucose-Capillary 103 (*)    All other components within normal limits  CBC  URINALYSIS, ROUTINE W REFLEX MICROSCOPIC  TROPONIN I  CBG MONITORING, ED    EKG  EKG Interpretation  Date/Time:  Friday August 27 2016  16:56:33 EDT Ventricular Rate:  66 PR Interval:  196 QRS Duration: 110 QT Interval:  414 QTC Calculation: 434 R Axis:   -48 Text Interpretation:  Sinus rhythm with occasional Premature ventricular complexes Left anterior fascicular block Abnormal QRS-T angle, consider primary T wave abnormality Abnormal ECG No significant change since last tracing Confirmed by Daijah Scrivens  MD, Reham Slabaugh [phone]) on 08/27/2016 9:47:31 PM       Radiology Dg Chest 2 View  Result Date: 08/27/2016 CLINICAL DATA:  Status post fall 3 times, with intermittent dizziness, acute onset. Initial encounter. EXAM: CHEST  2 VIEW COMPARISON:  Chest radiograph performed 05/07/2016 FINDINGS: The lungs are well-aerated. Mild bibasilar atelectasis is noted, with mild bilateral peripheral scarring. There is no evidence of pleural effusion or pneumothorax. The heart is normal in size; the mediastinal contour is within normal limits. No acute osseous abnormalities are seen. IMPRESSION: Mild bibasilar atelectasis, with mild bilateral peripheral scarring.  Electronically Signed   By: Garald Balding M.D.   On: 08/27/2016 23:00   Ct Head Wo Contrast  Result Date: 08/27/2016 CLINICAL DATA:  Status post fall 3 times,, with intermittent dizziness, acute onset. Initial encounter. EXAM: CT HEAD WITHOUT CONTRAST TECHNIQUE: Contiguous axial images were obtained from the base of the skull through the vertex without intravenous contrast. COMPARISON:  MRI of the brain performed 11/21/2015 FINDINGS: Brain: No evidence of acute infarction, hemorrhage, hydrocephalus, extra-axial collection or mass lesion/mass effect. Prominence of the ventricles and sulci reflects moderate cortical volume loss. Cerebellar atrophy is noted. Scattered periventricular and subcortical white matter change likely reflects small vessel ischemic microangiopathy. The brainstem and fourth ventricle are within normal limits. The basal ganglia are unremarkable in appearance. The cerebral  hemispheres demonstrate grossly normal gray-white differentiation. No mass effect or midline shift is seen. Vascular: No hyperdense vessel or unexpected calcification. Dense calcification is seen along the left vertebral artery. Skull: There is no evidence of fracture; visualized osseous structures are unremarkable in appearance. Sinuses/Orbits: The visualized portions of the orbits are within normal limits. The paranasal sinuses and mastoid air cells are well-aerated. Other: No significant soft tissue abnormalities are seen. IMPRESSION: 1. No acute intracranial pathology seen on CT. 2. Moderate cortical volume loss and scattered small vessel ischemic microangiopathy. 3. Dense calcification along the left vertebral artery. Electronically Signed   By: Garald Balding M.D.   On: 08/27/2016 23:02    Procedures Procedures (including critical care time)  Medications Ordered in ED Medications - No data to display   Initial Impression / Assessment and Plan / ED Course  I have reviewed the triage vital signs and the nursing notes.  Pertinent labs & imaging results that were available during my care of the patient were reviewed by me and considered in my medical decision making (see chart for details).    Patient with 3 near syncopal episodes today and/or just losing balance. Usually occurred when he would try to bend over. Denies any room spinning or vertigo. No other complaints. No chest pain no shortness of breath no headache no strokelike symptoms.  Workup here with any significant findings based on labs. Her pressures fine not febrile. Exam without significant findings. Head CT negative chest x-ray without any acute findings. Initial troponin was slightly abnormal at 0.03 adult troponin is pending. Repeat was done at 11:00.  Orthostatic blood pressures were done. That was standing some shift in lowering of the systolic blood pressure but not abnormal. Patient was not symptomatic.  If negative  patient can be discharged home.   Final Clinical Impressions(s) / ED Diagnoses   Final diagnoses:  Loss of balance  Near syncope    New Prescriptions New Prescriptions   No medications on file     Fredia Sorrow, MD 08/28/16 0005   Repeat troponin is unchanged. Patient stable for discharge home.   Fredia Sorrow, MD 08/28/16 0349    Fredia Sorrow, MD 08/28/16 1791

## 2016-08-27 NOTE — ED Notes (Signed)
Pt transported to CT/Xray. 

## 2016-08-28 NOTE — Discharge Instructions (Signed)
Today's workup without any significant findings. Follow-up with your doctor. Return for any new or worse symptoms. Make an appointment to follow-up with neurology if off-balance symptoms continue.

## 2016-08-31 NOTE — Progress Notes (Signed)
Tried to call pt- NA 

## 2016-09-01 NOTE — Progress Notes (Signed)
Tried to call pt- NA 

## 2016-09-02 ENCOUNTER — Other Ambulatory Visit: Payer: Self-pay | Admitting: Gastroenterology

## 2016-09-02 DIAGNOSIS — K625 Hemorrhage of anus and rectum: Secondary | ICD-10-CM

## 2016-09-02 NOTE — Progress Notes (Signed)
Letter mailed to the pt.  Lab orders on file.  

## 2016-09-09 LAB — CUP PACEART REMOTE DEVICE CHECK
Date Time Interrogation Session: 20180328053822
MDC IDC PG IMPLANT DT: 20160307

## 2016-09-14 ENCOUNTER — Other Ambulatory Visit: Payer: Self-pay | Admitting: Cardiology

## 2016-09-23 DIAGNOSIS — E663 Overweight: Secondary | ICD-10-CM | POA: Diagnosis not present

## 2016-09-23 DIAGNOSIS — I639 Cerebral infarction, unspecified: Secondary | ICD-10-CM | POA: Diagnosis not present

## 2016-09-23 DIAGNOSIS — I429 Cardiomyopathy, unspecified: Secondary | ICD-10-CM | POA: Diagnosis not present

## 2016-09-23 DIAGNOSIS — Z299 Encounter for prophylactic measures, unspecified: Secondary | ICD-10-CM | POA: Diagnosis not present

## 2016-09-23 DIAGNOSIS — R2681 Unsteadiness on feet: Secondary | ICD-10-CM | POA: Diagnosis not present

## 2016-09-23 DIAGNOSIS — R42 Dizziness and giddiness: Secondary | ICD-10-CM | POA: Diagnosis not present

## 2016-09-23 DIAGNOSIS — Z789 Other specified health status: Secondary | ICD-10-CM | POA: Diagnosis not present

## 2016-09-23 NOTE — Progress Notes (Signed)
Cardiology Office Note  Date: 09/27/2016   ID: Alejandro [name], DOB 1932/06/19, MRN [id number]  PCP: Glenda Chroman, MD  Primary Cardiologist: Rozann Lesches, MD   Chief Complaint  Patient presents with  . History of syncope  . History of cardiomyopathy    History of Present Illness: Alejandro [name] is an 83 y.o. male last seen in December 2017. He is here today with his wife. Continues to complain of episodes of dizziness/lightheadedness. Has a difficult time characterizing his symptoms, states sometimes he just "doesn't feel right." He has had some falls while standing, not definitively syncope based on description. No definite vertigo, not associated with chest pain or palpitations. He tells me that his PCP is referring him for a neurological assessment.  Record review finds ER visit in late March with complaints of lightheadedness, no syncope. This occurred when he was bending over. I reviewed notes, imaging studies, and lab work from that assessment.  Most recent ILR check in April revealed no pauses. 7 AF episodes noted, 1.2% with controlled ventricular rates. He remains on amiodarone, recent LFTs were normal. Also remains on Xarelto for stroke prophylaxis. He is not on any antihypertensives.  Orthostatics done in the office today were abnormal. Supine blood pressure 122/80 with heart rate 65, seated blood pressure 100/68 with heart rate 60, standing blood pressure 98/60 with heart rate 58, and standing after 3 minutes blood pressure 90/58 with heart rate 60.  Past Medical History:  Diagnosis Date  . CAD (coronary artery disease)    Moderate multivessel March 2016  . Essential hypertension   . Memory loss   . NSVT (nonsustained ventricular tachycardia) ([name])    Negative EP study  . Paroxysmal atrial fibrillation ([name])   . Secondary cardiomyopathy ([place])    LVEF 40-45% January 2014  . Syncope   . TIA (transient ischemic attack)     Past Surgical History:  Procedure  Laterality Date  . CATARACT EXTRACTION    . COLONOSCOPY  unknown  . COLONOSCOPY N/A 09/05/2014   Procedure: COLONOSCOPY;  Surgeon: [name], MD;  [facility];  Service: Endoscopy;  Laterality: N/A;  145pm  . ELECTROPHYSIOLOGY STUDY N/A 08/05/2014   Procedure: ELECTROPHYSIOLOGY STUDY;  Surgeon: Deboraha Sprang, MD;  Location: [facility] CATH LAB;  Service: Cardiovascular;  Laterality: N/A;  . LAPAROSCOPIC APPENDECTOMY  November 2015   Dr. [name]  . LEFT HEART CATHETERIZATION WITH CORONARY ANGIOGRAM N/A 07/31/2014   Procedure: LEFT HEART CATHETERIZATION WITH CORONARY ANGIOGRAM;  Surgeon: [name], MD;  Location: [facility] CATH LAB;  Service: Cardiovascular;  Laterality: N/A;  . LOOP RECORDER IMPLANT      Current Outpatient Prescriptions  Medication Sig Dispense Refill  . amiodarone (PACERONE) 200 MG tablet TAKE (1/2) TABLET BY MOUTH DAILY. 45 tablet 1  . XARELTO 20 MG TABS tablet TAKE 1 TABLET DAILY WITH SUPPER----(STOP SAVAYSA). (Patient taking differently: TAKE 1 TABLET DAILY WITH SUPPER) 30 tablet 6  . fludrocortisone (FLORINEF) 0.1 MG tablet Take 1 tablet (0.1 mg total) by mouth daily. 30 tablet 3   No current facility-administered medications for this visit.    Allergies:  Patient has no known allergies.   Social History: The patient  reports that he has never smoked. He has never used smokeless tobacco. He reports that he does not drink alcohol or use drugs.   ROS:  Please see the history of present illness. Otherwise, complete review of systems is positive for intermittent lightheadedness.  All other  systems are reviewed and negative.   Physical Exam: VS:  BP 122/74   Pulse 74   Ht 5\' 8"  (1.727 m)   Wt 185 lb 9.6 oz (84.2 kg)   SpO2 96%   BMI 28.22 kg/m , BMI Body mass index is 28.22 kg/m.  Wt Readings from Last 3 Encounters:  09/27/16 185 lb 9.6 oz (84.2 kg)  08/27/16 175 lb (79.4 kg)  08/06/16 184 lb 6.4 oz (83.6 kg)    General: Elderly male. Appears comfortable  at rest. HEENT: Conjunctiva and lids normal, oropharynx clear. Neck: Supple, no elevated JVP or carotid bruits, no thyromegaly. Lungs: Clear to auscultation, nonlabored breathing at rest. Cardiac: Regular rate and rhythm, no S3, soft systolic murmur, no pericardial rub. Abdomen: Soft, nontender, bowel sounds present, no guarding or rebound. Extremities: No pitting edema, distal pulses 2+. Skin: Warm and dry. Musculoskeletal: No kyphosis. Neuropsychiatric: Alert and oriented 3, affect appropriate.  ECG: I personally reviewed the tracing from 08/27/2016 which showed sinus rhythm with PVCs, left anterior fascicular block, nonspecific T-wave changes.  Recent Labwork: 08/27/2016: BUN 21; Creatinine, Ser 1.43; Hemoglobin 15.0; Platelets 223; Potassium 4.4; Sodium 137     Component Value Date/Time   CHOL 138 08/03/2014 0304   TRIG 73 08/03/2014 0304   HDL 33 (L) 08/03/2014 0304   CHOLHDL 4.2 08/03/2014 0304   VLDL 15 08/03/2014 0304   LDLCALC 90 08/03/2014 0304  February 2018: AST 20, ALT 11  Other Studies Reviewed Today:  Chest x-ray 08/27/2016: FINDINGS: The lungs are well-aerated. Mild bibasilar atelectasis is noted, with mild bilateral peripheral scarring. There is no evidence of pleural effusion or pneumothorax.  The heart is normal in size; the mediastinal contour is within normal limits. No acute osseous abnormalities are seen.  IMPRESSION: Mild bibasilar atelectasis, with mild bilateral peripheral scarring.  Head CT 08/27/2016: IMPRESSION: 1. No acute intracranial pathology seen on CT. 2. Moderate cortical volume loss and scattered small vessel ischemic microangiopathy. 3. Dense calcification along the left vertebral artery.  Echocardiogram 06/06/2014: Study Conclusions  - Left ventricle: The cavity size was normal. Wall thickness was increased in a pattern of mild LVH. Systolic function was normal. The estimated ejection fraction was in the range of 50% to  55%. Probable hypokinesis of the basal-midinferolateral myocardium. Doppler parameters are consistent with abnormal left ventricular relaxation (grade 1 diastolic dysfunction). - Aortic valve: Trileaflet; mildly calcified leaflets width Lambl&'s excrescences. There was trivial regurgitation. - Aortic root: The aortic root was mildly ectatic. - Mitral valve: Calcified annulus. There was trivial regurgitation. - Right atrium: Central venous pressure (est): 3 mm Hg. - Tricuspid valve: There was trivial regurgitation. - Pulmonary arteries: PA peak pressure: 27 mm Hg (S). - Pericardium, extracardiac: There was no pericardial effusion.  Impressions:  - Mild LVH with LVEF approximately 50-55% in the setting of frequent ventricular ectopy. There is mid to basal inferolateral hypokinesis and grade 1 diastolic dysfunction. Sclerotic aortic valve with trivial aortic regurgitation. Mildly ectatic aortic root. Trivial tricuspid regurgitation with PASP 27 mmHg.  Assessment and Plan:  1. History of recurring lightheadedness, no recent frank syncopal events. He has an ILR in place with interrogations showing no pauses and only brief episodes of atrial fibrillation. Orthostatics were abnormal today, certainly a potential contributor to his symptoms. We will try Florinef beginning at 0.1 mg daily. He might also benefit from compression stockings during waking hours.  2. Paroxysmal atrial fibrillation. Continues on amiodarone for rhythm suppression and Xarelto for stroke prophylaxis. CHADSVASC score is  4.  3. History of nonischemic cardiomyopathy. Last LVEF 50-55%. In general, he has not tolerated long-term therapies including beta blocker or ARB.  4. Essential hypertension, blood pressure is normal today.  Current medicines were reviewed with the patient today.  Disposition: Follow-up in 4 months.  Signed, Satira Sark, MD, [facility] 09/27/2016 9:45 AM    [facility] [place], [place], [place] [address] Phone: [phone]; Fax: [phone]

## 2016-09-24 ENCOUNTER — Ambulatory Visit (INDEPENDENT_AMBULATORY_CARE_PROVIDER_SITE_OTHER): Payer: Medicare Other | Admitting: *Deleted

## 2016-09-24 DIAGNOSIS — R55 Syncope and collapse: Secondary | ICD-10-CM

## 2016-09-24 NOTE — Progress Notes (Signed)
Carelink Summary Report / Loop Recorder 

## 2016-09-27 ENCOUNTER — Encounter: Payer: Self-pay | Admitting: Cardiology

## 2016-09-27 ENCOUNTER — Ambulatory Visit (INDEPENDENT_AMBULATORY_CARE_PROVIDER_SITE_OTHER): Payer: Medicare Other | Admitting: Cardiology

## 2016-09-27 VITALS — BP 122/74 | HR 74 | Ht 68.0 in | Wt 185.6 lb

## 2016-09-27 DIAGNOSIS — I10 Essential (primary) hypertension: Secondary | ICD-10-CM

## 2016-09-27 DIAGNOSIS — I951 Orthostatic hypotension: Secondary | ICD-10-CM | POA: Diagnosis not present

## 2016-09-27 DIAGNOSIS — I428 Other cardiomyopathies: Secondary | ICD-10-CM | POA: Diagnosis not present

## 2016-09-27 DIAGNOSIS — I480 Paroxysmal atrial fibrillation: Secondary | ICD-10-CM | POA: Diagnosis not present

## 2016-09-27 MED ORDER — FLUDROCORTISONE ACETATE 0.1 MG PO TABS
0.1000 mg | ORAL_TABLET | Freq: Every day | ORAL | 3 refills | Status: DC
Start: 2016-09-27 — End: 2016-10-21

## 2016-09-27 NOTE — Patient Instructions (Signed)
Medication Instructions:  Your physician has recommended you make the following change in your medication: BEGIN TAKING FLORINEF 0.1 MG ONCE DAILY CONTINUE ALL OTHER MEDICATIONS AS DIRECTED  Labwork: NONE  Testing/Procedures: NONE  Follow-Up: Your physician wants you to follow-up in: [place] DR. MCDOWELL. You will receive a reminder letter in the mail two months in advance. If you don't receive a letter, please call our office to schedule the follow-up appointment.  Any Other Special Instructions Will Be Listed Below (If Applicable).  If you need a refill on your cardiac medications before your next appointment, please call your pharmacy.

## 2016-09-28 ENCOUNTER — Telehealth: Payer: Self-pay | Admitting: *Deleted

## 2016-09-28 NOTE — Telephone Encounter (Signed)
Spoke with patient's wife regarding receiving manual transmission and will review the increase AF burden with Dr. Rayann Heman this week. Patient's wife states patient is asymptomatic with AF episodes 4/26-5/1. She states patient was seen by Dr. Domenic Polite 4/30 for orthostatic hypotension associated with weakness and dizziness only when he first gets up in the morning. Advised patient's wife to call [facility] in the future if patient develops any symptoms with AF episodes including SOB, palpitations, dizziness, etc. She verbalizes understanding and appreciates the call and denies any further questions or concerns.

## 2016-09-28 NOTE — Telephone Encounter (Signed)
Spoke with patient's wife, [name] regarding symptoms with increased AF burden over past few days. Patient's wife states patient is asymptomatic with AF episodes and working outside at this time. Patient's Wife reports that he is taking amiodarone 100 mg daily and Xarelto 20 mg daily as prescribed. Advised wife to have the patient to send manual transmission, no available ECG's at this time. [facility] phone number to call if patient needs assistance sending transmission and I will call patient back once I receive manual transmission. Patient's wife verbalized understanding.

## 2016-09-29 ENCOUNTER — Other Ambulatory Visit: Payer: Self-pay | Admitting: Internal Medicine

## 2016-10-01 NOTE — Telephone Encounter (Signed)
Reviewed AF burden with Dr. Rayann Heman, recommended to review with Dr. Domenic Polite. Medical records scanned report to Dr. Myles Gip inbox 09/29/16.

## 2016-10-05 ENCOUNTER — Ambulatory Visit (INDEPENDENT_AMBULATORY_CARE_PROVIDER_SITE_OTHER): Payer: Medicare Other | Admitting: Neurology

## 2016-10-05 ENCOUNTER — Encounter: Payer: Self-pay | Admitting: Neurology

## 2016-10-05 ENCOUNTER — Telehealth: Payer: Self-pay | Admitting: Cardiology

## 2016-10-05 VITALS — Resp 20 | Ht 68.0 in | Wt 186.0 lb

## 2016-10-05 DIAGNOSIS — Z8673 Personal history of transient ischemic attack (TIA), and cerebral infarction without residual deficits: Secondary | ICD-10-CM | POA: Diagnosis not present

## 2016-10-05 DIAGNOSIS — R2689 Other abnormalities of gait and mobility: Secondary | ICD-10-CM

## 2016-10-05 DIAGNOSIS — R42 Dizziness and giddiness: Secondary | ICD-10-CM | POA: Diagnosis not present

## 2016-10-05 DIAGNOSIS — I951 Orthostatic hypotension: Secondary | ICD-10-CM

## 2016-10-05 NOTE — Progress Notes (Signed)
Subjective:    Patient ID: Alejandro [name] is a 83 y.o. male.  HPI     Star Age, MD, PhD [facility] [address] [place], [place] [address]  Dear Rennis Petty,   I saw your patient, Alejandro [name], upon your kind request, in neurologic clinic today for his gait disorder and near falls. The patient is accompanied by his wife and GGD today. As you know, Alejandro [name] is a 83 year old right-handed gentleman with an underlying medical history of reflux disease, coronary artery disease, hypertension, memory loss, paroxysmal A. fib, cardiomyopathy, history of TIA, history of stroke, history of syncope, who presented to the emergency room on 08/27/2016 with 3 episodes of near syncope. He also reported flare up of his reflux. I reviewed the emergency room records. Head CT without contrast was negative for any acute changes, chest x-ray also without acute findings and repeat cardiac enzymes were unremarkable. He did not have any significant orthostatic hypotension and he also was not symptomatic with position changes.  I reviewed his head CT report from 08/27/2016: IMPRESSION: 1. No acute intracranial pathology seen on CT. 2. Moderate cortical volume loss and scattered small vessel ischemic microangiopathy. 3. Dense calcification along the left vertebral artery. He had a brain MRI without contrast on 12/25/2014 which I reviewed: IMPRESSION: 1. No acute intracranial abnormality. 2. Expected evolution of left occipital lobe infarct. 3. Stable atrophy and white matter disease. This likely reflects the sequela of chronic microvascular ischemia. 4. Mild anterior paranasal sinus disease without a fluid level.   I reviewed your office note from 09/23/2016, which you kindly included. He has been complaining of feeling dizzy when he bends over or stands up but denies sensation of room spinning. He was advised to start using a walker. He was also advised to stop driving.  He was referred to cardiology as well. He was found to have A fib in 2016. He was recently started on Florinef 0.1 for [facility]. He feels that it causes him to be more tired, lethargic. He has no energy to even check his mail and when he comes back he feels strange and lightheaded. He does not exercise on a regular basis, feels that he cannot walk and becomes short of breath. They do have an exercise bike which he has not been using. He also does not drink enough water, wife reports that he likes to drink Dr. Malachi Bonds, 2 or 3 cans per day, hardly any water, just enough to take his medications. She denies any significant snoring or sleep disturbance in his case. He denies waking up with a sense of choking. After he was diagnosed with a stroke in 2016 he recovered quite well. He denies any one-sided weakness or numbness or slurring of speech or facial droop recently. He does not have a cane or walker. She states that he would not use 1.  His Past Medical History Is Significant For: Past Medical History:  Diagnosis Date  . CAD (coronary artery disease)    Moderate multivessel March 2016  . Essential hypertension   . Memory loss   . NSVT (nonsustained ventricular tachycardia) ([name])    Negative EP study  . Paroxysmal atrial fibrillation ([name])   . Secondary cardiomyopathy ([place])    LVEF 40-45% January 2014  . Syncope   . TIA (transient ischemic attack)     His Past Surgical History Is Significant For: Past Surgical History:  Procedure Laterality Date  . CATARACT EXTRACTION    .  COLONOSCOPY  unknown  . COLONOSCOPY N/A 09/05/2014   Procedure: COLONOSCOPY;  Surgeon: [name], MD;  Location: [facility];  Service: Endoscopy;  Laterality: N/A;  145pm  . ELECTROPHYSIOLOGY STUDY N/A 08/05/2014   Procedure: ELECTROPHYSIOLOGY STUDY;  Surgeon: Deboraha Sprang, MD;  Location: [facility] CATH LAB;  Service: Cardiovascular;  Laterality: N/A;  . LAPAROSCOPIC APPENDECTOMY  November 2015   Dr. [name]  . LEFT HEART  CATHETERIZATION WITH CORONARY ANGIOGRAM N/A 07/31/2014   Procedure: LEFT HEART CATHETERIZATION WITH CORONARY ANGIOGRAM;  Surgeon: [name], MD;  Location: [place] CATH LAB;  Service: Cardiovascular;  Laterality: N/A;  . LOOP RECORDER IMPLANT      His Family History Is Significant For: Family History  Problem Relation Age of Onset  . Heart disease Father     Diagnosed in his 76s  . Arthritis Father   . Alzheimer's disease Mother     His Social History Is Significant For: Social History   Social History  . Marital status: Married    Spouse name: N/A  . Number of children: N/A  . Years of education: 65   Occupational History  . Retired    Social History Main Topics  . Smoking status: Never Smoker  . Smokeless tobacco: Never Used  . Alcohol use No  . Drug use: No  . Sexual activity: Not Asked   Other Topics Concern  . None   Social History Narrative   Caffeine: 2 drinks a day     His Allergies Are:  No Known Allergies:   His Current Medications Are:  Outpatient Encounter Prescriptions as of 10/05/2016  Medication Sig  . amiodarone (PACERONE) 200 MG tablet TAKE (1/2) TABLET BY MOUTH DAILY.  . fludrocortisone (FLORINEF) 0.1 MG tablet Take 1 tablet (0.1 mg total) by mouth daily.  Alveda Reasons 20 MG TABS tablet TAKE 1 TABLET DAILY WITH SUPPER----(STOP SAVAYSA). (Patient taking differently: TAKE 1 TABLET DAILY WITH SUPPER)   No facility-administered encounter medications on file as of 10/05/2016.   :   Review of Systems:  Out of a complete 14 point review of systems, all are reviewed and negative with the exception of these symptoms as listed below:         Review of Systems  Neurological: Positive for dizziness.       08/27/16 Patient fell 3 times, PCP recommended that pt go to hospital. Patient was seen at [facility], no cause was found per wife.  Patient c/o of dizziness. States that he has had an increase in dizziness and shortness of breath since starting  Florinef.     Objective:  Neurologic Exam  Physical Exam Physical Examination:   Vitals:   10/05/16 1020  Resp: 20    General Examination: The patient is a very pleasant 83 y.o. male in no acute distress. He appears mildly deconditioned. He appears mildly anxious. He feels lightheaded upon standing and has a mild drop in blood pressure values. Sitting was 114/75 with a pulse of 52, standing 106/66 with a pulse of 69. He denies vertiginous symptoms or nausea. He denies chest pain or shortness of breath.  HEENT: Normocephalic, atraumatic, pupils are equal, round and reactive to light and accommodation. He is status post cataract repairs. No nystagmus is noted. Hearing appears to be mildly impaired. Extraocular tracking is good. Face is symmetric with normal facial animation and normal facial sensation. Speech is clear with no dysarthria noted. There is no hypophonia. There is no lip, neck/head, jaw or  voice tremor. Neck is supple with full range of passive and active motion. There are no carotid bruits on auscultation. Oropharynx exam reveals: mild mouth dryness, adequate dental hygiene. Tongue protrudes centrally and palate elevates symmetrically.   Chest: Clear to auscultation without wheezing, rhonchi or crackles noted.  Heart: S1+S2+0, regular and normal without murmurs, rubs or gallops noted.   Abdomen: Soft, non-tender and non-distended with normal bowel sounds appreciated on auscultation.  Extremities: There is no pitting edema in the distal lower extremities bilaterally. Pedal pulses are intact.  Skin: Warm and dry without trophic changes noted.  Musculoskeletal: exam reveals no obvious joint deformities, tenderness or joint swelling or erythema.   Neurologically:  Mental status: The patient is awake, alert and oriented in all 4 spheres. His immediate and remote memory, attention, language skills and fund of knowledge appear to be mildly impaired. Speech is clear with normal  prosody and enunciation. Thought process is linear. Mood is normal and affect is anxious.  Cranial nerves II - XII are as described above under HEENT exam. In addition: shoulder shrug is normal with equal shoulder height noted.  Motor exam: Globally thin bulk, global strength of 4+ out of 5, tone appears to be normal. He has no resting tremor, no drift, Romberg is negative. Reflexes are 1+ throughout, except absent in the ankles. Fine motor skills are mildly impaired globally.  Cerebellar testing: No dysmetria or intention tremor on finger to nose testing. There is no truncal or gait ataxia.  Sensory exam: intact to light touch, vibration, and temperature sense in the upper and lower extremities.  Gait, station and balance: He stands with difficulty and pushes himself up. He stands slightly wide-based initially. He walks without assistance. He feels slightly lightheaded with walking, tandem walk is not testable for safety. He has no limp, he has preserved arm swing bilaterally.   Assessment and Plan:   In summary, Alejandro [name] is a very pleasant 83 y.o.-year old male with an underlying medical history of reflux disease, coronary artery disease, hypertension, memory loss, paroxysmal A. fib, cardiomyopathy, history of TIA, history of stroke, history of syncope, who  presents for evaluation of his gait disorder and balance problems. On examination, he has a nonfocal exam, he has a nonspecific gait disorder and symptoms of dizziness, nonvertiginous, some evidence of orthostatic symptoms. He was recently diagnosed with orthostatic hypotension and started on Florinef once daily. He feels that he has been more lethargic after it was started about 8 or 9 days ago. He is advised that medications can take a while to take effect, he does not have any focal neurological symptoms or exam. I explained to the patient and his wife that he most likely has a multifactorial problem affecting his gait and balance including  suboptimal hydration, deconditioning secondary to lack of exercise, orthostatic lightheadedness, in addition to normal aging which can cause balance problems. For gait safety, he is advised that he may need to start using a cane or walker. He is furthermore advised to consider using compression stockings to help with his orthostatic hypotension and be better hydrated with water, reduce his soda intake. We talked about the importance of exercise and he is advised to try to exercise within his limitations. They do have an exercise bike which he could utilize. From my end of things I suggested we proceed with a brain MRI without contrast to compare findings with July 2016 when he was diagnosed with a subacute stroke. He had some white matter  changes at the time. Furthermore I will order an EEG for completion. We will call him with his MRI and EEG results. I did not suggest any new medication from my end of things. I may be able to see him on an as-needed basis from here but we will keep them posted as to his test results and take it from there. I answered all their questions today and the patient and his wife were in agreement.   Thank you very much for allowing me to participate in the care of this nice patient. If I can be of any further assistance to you please do not hesitate to call me at [phone].  Sincerely,   Star Age, MD, PhD

## 2016-10-05 NOTE — Telephone Encounter (Signed)
Noted. He has had trouble with various medicines in the past as well. Not certain that Florinef is causing the symptoms, but he can certainly stop it to see if he feels better. My intention was to try and prevent any potential orthostasis that was contributing to his lightheadedness and falls. If we are not able to use medication, he could consider getting compression hose and use these during the day when he is ambulatory.

## 2016-10-05 NOTE — Telephone Encounter (Signed)
Mrs. Bowdish called stating that since Mr. Maiden starting taking the fludrocortisone (FLORINEF) 0.1 MG tablet  He is very weak. States that he can hardly stand up.

## 2016-10-05 NOTE — Telephone Encounter (Signed)
Patient informed and verbalized understanding of plan. 

## 2016-10-05 NOTE — Telephone Encounter (Signed)
Patient c/o having SOB and fatigue since starting florinef 0.1 mg daily. Patient said when her  walks about 30 ft to his road, the sob and fatigue happens. Patient said the symptoms resolve after her rest a while. Denies dizziness, chest pain, n/v or swelling. Please advise.

## 2016-10-05 NOTE — Patient Instructions (Addendum)
Unfortunately, dizziness is a very common complaint but is often not due to a primary neurological reason or single underlying medical problem. Often, there a combination of factors, that result in dizziness. This includes blood pressure fluctuations, medication side effects, blood sugar fluctuations, stress, vertigo, poor sleep with sleep deprivation, dehydration, and electrolyte disturbance or other metabolic and endocrinological reasons, meaning hormone related problems such as thyroid dysfunction.  We will investigate things further with a brain MRI and EEG. We will call you with the test results. We can consider physical therapy. I believe you have a gait disorder, which likely is due to a combination of things: normal aging, possible medication effect, and atherosclerosis of the blood vessels in your brain.   Remember to drink plenty of fluid, eat healthy meals and do not skip any meals. Try to eat protein with a every meal and eat a healthy snack such as fruit or nuts in between meals. Try to keep a regular sleep-wake schedule and try to exercise daily, particularly in the form of walking, 20-30 minutes a day, if you can.   As far as your medications are concerned, I would like to suggest no new medications. Please reduce your soda intake and increase your water intake, try knee-high compression stockings.    As far as diagnostic testing: We will do a brain scan, called MRI and call you with the test results. We will have to schedule you for this on a separate date. This test requires authorization from your insurance, and we will take care of the insurance process. We will do an EEG (brainwave test), which we will schedule. We will call you with the results.  Please remember: you may need to start using a walker. Turn slowly, no bending down to pick anything, no heavy lifting, be extra careful at night and first thing in the morning. Also, be careful in the Bathroom and the kitchen. Make enough  light if you have to get up at night.   I will see you back as needed at this point, so long as your tests are stable, and reassuring.

## 2016-10-07 LAB — CUP PACEART REMOTE DEVICE CHECK
Date Time Interrogation Session: 20180427060803
MDC IDC PG IMPLANT DT: 20160307

## 2016-10-11 ENCOUNTER — Telehealth: Payer: Self-pay | Admitting: Internal Medicine

## 2016-10-11 ENCOUNTER — Ambulatory Visit (HOSPITAL_COMMUNITY)
Admission: RE | Admit: 2016-10-11 | Discharge: 2016-10-11 | Disposition: A | Discharge status: Home / Self Care | Payer: Medicare Other | Source: Ambulatory Visit | Attending: Neurology | Admitting: Neurology

## 2016-10-11 DIAGNOSIS — Z8673 Personal history of transient ischemic attack (TIA), and cerebral infarction without residual deficits: Secondary | ICD-10-CM

## 2016-10-11 DIAGNOSIS — R2689 Other abnormalities of gait and mobility: Secondary | ICD-10-CM

## 2016-10-11 DIAGNOSIS — R42 Dizziness and giddiness: Secondary | ICD-10-CM | POA: Insufficient documentation

## 2016-10-11 DIAGNOSIS — I951 Orthostatic hypotension: Secondary | ICD-10-CM | POA: Insufficient documentation

## 2016-10-11 NOTE — Telephone Encounter (Signed)
Spoke with pt's wife, informed her that ps LINQ is MRI capable but she will need to send in a manual transmission prior to having the MRI. Pts wife voiced understanding.

## 2016-10-11 NOTE — Telephone Encounter (Signed)
Alejandro [name] is scheduled to have an MRI of brain on 10/11/2016 @ [facility]. They were told by Radiology that someone needs to let them know about his device.  Patient was Told he cannot have the procedure until they hear from Dr.Allred's office.   Please call Alejandro [name] [phone].

## 2016-10-12 NOTE — Progress Notes (Signed)
Please call patient regarding the recent brain MRI: The brain scan showed a normal structure of the brain and moderate volume loss which we call atrophy. There were changes in the deeper structures of the brain, which we call white matter changes or microvascular changes. These were reported as moderate in His case. These are tiny white spots, that occur with time and are seen in a variety of conditions, including with normal aging, chronic hypertension, chronic headaches, especially migraine HAs, chronic diabetes, chronic hyperlipidemia. These are not strokes and no mass or lesions.  He does have evidence of old strokes, which were evident in the MRI before, in 2016.  Again, there were no acute findings, such as a new or recent stroke, or mass or blood products. No further action is required on this test at this time, other than re-enforcing the importance of good blood pressure control, good cholesterol control, good blood sugar control, and points we discussed during recent appt. Thanks,  Star Age, MD, PhD

## 2016-10-13 ENCOUNTER — Ambulatory Visit (HOSPITAL_COMMUNITY): Payer: Medicare Other

## 2016-10-14 ENCOUNTER — Inpatient Hospital Stay (HOSPITAL_COMMUNITY)
Admission: EM | Admit: 2016-10-14 | Discharge: 2016-10-21 | DRG: 291 | Disposition: A | Discharge status: Home / Self Care | Payer: Medicare Other | Attending: Internal Medicine | Admitting: Internal Medicine

## 2016-10-14 ENCOUNTER — Telehealth: Payer: Self-pay

## 2016-10-14 ENCOUNTER — Encounter (HOSPITAL_COMMUNITY): Payer: Self-pay | Admitting: Emergency Medicine

## 2016-10-14 ENCOUNTER — Ambulatory Visit (HOSPITAL_COMMUNITY): Payer: Medicare Other

## 2016-10-14 ENCOUNTER — Emergency Department (HOSPITAL_COMMUNITY): Payer: Medicare Other

## 2016-10-14 DIAGNOSIS — J9600 Acute respiratory failure, unspecified whether with hypoxia or hypercapnia: Secondary | ICD-10-CM

## 2016-10-14 DIAGNOSIS — N183 Chronic kidney disease, stage 3 unspecified: Secondary | ICD-10-CM | POA: Diagnosis present

## 2016-10-14 DIAGNOSIS — T501X5A Adverse effect of loop [high-ceiling] diuretics, initial encounter: Secondary | ICD-10-CM | POA: Diagnosis not present

## 2016-10-14 DIAGNOSIS — Z7901 Long term (current) use of anticoagulants: Secondary | ICD-10-CM | POA: Diagnosis not present

## 2016-10-14 DIAGNOSIS — R0902 Hypoxemia: Secondary | ICD-10-CM | POA: Diagnosis not present

## 2016-10-14 DIAGNOSIS — E785 Hyperlipidemia, unspecified: Secondary | ICD-10-CM | POA: Diagnosis present

## 2016-10-14 DIAGNOSIS — I110 Hypertensive heart disease with heart failure: Secondary | ICD-10-CM | POA: Diagnosis not present

## 2016-10-14 DIAGNOSIS — R0602 Shortness of breath: Secondary | ICD-10-CM | POA: Diagnosis not present

## 2016-10-14 DIAGNOSIS — I2510 Atherosclerotic heart disease of native coronary artery without angina pectoris: Secondary | ICD-10-CM | POA: Diagnosis present

## 2016-10-14 DIAGNOSIS — I5043 Acute on chronic combined systolic (congestive) and diastolic (congestive) heart failure: Secondary | ICD-10-CM | POA: Diagnosis not present

## 2016-10-14 DIAGNOSIS — I340 Nonrheumatic mitral (valve) insufficiency: Secondary | ICD-10-CM | POA: Diagnosis not present

## 2016-10-14 DIAGNOSIS — I447 Left bundle-branch block, unspecified: Secondary | ICD-10-CM | POA: Diagnosis present

## 2016-10-14 DIAGNOSIS — I69319 Unspecified symptoms and signs involving cognitive functions following cerebral infarction: Secondary | ICD-10-CM

## 2016-10-14 DIAGNOSIS — Z6828 Body mass index (BMI) 28.0-28.9, adult: Secondary | ICD-10-CM

## 2016-10-14 DIAGNOSIS — R413 Other amnesia: Secondary | ICD-10-CM | POA: Diagnosis present

## 2016-10-14 DIAGNOSIS — J9601 Acute respiratory failure with hypoxia: Secondary | ICD-10-CM | POA: Diagnosis present

## 2016-10-14 DIAGNOSIS — I25118 Atherosclerotic heart disease of native coronary artery with other forms of angina pectoris: Secondary | ICD-10-CM | POA: Diagnosis not present

## 2016-10-14 DIAGNOSIS — I509 Heart failure, unspecified: Secondary | ICD-10-CM

## 2016-10-14 DIAGNOSIS — I130 Hypertensive heart and chronic kidney disease with heart failure and stage 1 through stage 4 chronic kidney disease, or unspecified chronic kidney disease: Secondary | ICD-10-CM | POA: Diagnosis not present

## 2016-10-14 DIAGNOSIS — T380X5A Adverse effect of glucocorticoids and synthetic analogues, initial encounter: Secondary | ICD-10-CM | POA: Diagnosis present

## 2016-10-14 DIAGNOSIS — I480 Paroxysmal atrial fibrillation: Secondary | ICD-10-CM | POA: Diagnosis present

## 2016-10-14 DIAGNOSIS — I951 Orthostatic hypotension: Secondary | ICD-10-CM | POA: Diagnosis not present

## 2016-10-14 DIAGNOSIS — R42 Dizziness and giddiness: Secondary | ICD-10-CM | POA: Diagnosis not present

## 2016-10-14 DIAGNOSIS — I4891 Unspecified atrial fibrillation: Secondary | ICD-10-CM | POA: Diagnosis present

## 2016-10-14 DIAGNOSIS — I429 Cardiomyopathy, unspecified: Secondary | ICD-10-CM | POA: Diagnosis present

## 2016-10-14 DIAGNOSIS — I481 Persistent atrial fibrillation: Secondary | ICD-10-CM | POA: Diagnosis present

## 2016-10-14 DIAGNOSIS — Z79899 Other long term (current) drug therapy: Secondary | ICD-10-CM

## 2016-10-14 DIAGNOSIS — F0390 Unspecified dementia without behavioral disturbance: Secondary | ICD-10-CM | POA: Diagnosis not present

## 2016-10-14 DIAGNOSIS — R7981 Abnormal blood-gas level: Secondary | ICD-10-CM | POA: Diagnosis not present

## 2016-10-14 DIAGNOSIS — R0609 Other forms of dyspnea: Secondary | ICD-10-CM

## 2016-10-14 DIAGNOSIS — Z299 Encounter for prophylactic measures, unspecified: Secondary | ICD-10-CM | POA: Diagnosis not present

## 2016-10-14 DIAGNOSIS — I10 Essential (primary) hypertension: Secondary | ICD-10-CM | POA: Diagnosis not present

## 2016-10-14 DIAGNOSIS — E7800 Pure hypercholesterolemia, unspecified: Secondary | ICD-10-CM | POA: Diagnosis not present

## 2016-10-14 HISTORY — DX: Heart failure, unspecified: I50.9

## 2016-10-14 LAB — CBC
HCT: 39.5 % (ref 39.0–52.0)
HEMOGLOBIN: 12.9 g/dL — AB (ref 13.0–17.0)
MCH: 32 pg (ref 26.0–34.0)
MCHC: 32.7 g/dL (ref 30.0–36.0)
MCV: 98 fL (ref 78.0–100.0)
Platelets: 330 10*3/uL (ref 150–400)
RBC: 4.03 MIL/uL — AB (ref 4.22–5.81)
RDW: 13.7 % (ref 11.5–15.5)
WBC: 10.4 10*3/uL (ref 4.0–10.5)

## 2016-10-14 LAB — PROCALCITONIN

## 2016-10-14 LAB — BASIC METABOLIC PANEL
ANION GAP: 8 (ref 5–15)
BUN: 18 mg/dL (ref 6–20)
CALCIUM: 8.7 mg/dL — AB (ref 8.9–10.3)
CO2: 25 mmol/L (ref 22–32)
Chloride: 105 mmol/L (ref 101–111)
Creatinine, Ser: 1.12 mg/dL (ref 0.61–1.24)
GFR calc non Af Amer: 59 mL/min — ABNORMAL LOW (ref >60)
GLUCOSE: 99 mg/dL (ref 65–99)
Potassium: 4 mmol/L (ref 3.5–5.1)
Sodium: 138 mmol/L (ref 135–145)

## 2016-10-14 LAB — I-STAT TROPONIN, ED: TROPONIN I, POC: 0.01 ng/mL (ref 0.00–0.08)

## 2016-10-14 LAB — D-DIMER, QUANTITATIVE: D-Dimer, Quant: 0.51 ug/mL-FEU — ABNORMAL HIGH (ref 0.00–0.50)

## 2016-10-14 LAB — MRSA PCR SCREENING: MRSA by PCR: NEGATIVE

## 2016-10-14 LAB — BRAIN NATRIURETIC PEPTIDE: B Natriuretic Peptide: 794 pg/mL — ABNORMAL HIGH (ref 0.0–100.0)

## 2016-10-14 LAB — TROPONIN I: TROPONIN I: 0.03 ng/mL — AB (ref <0.03)

## 2016-10-14 MED ORDER — RIVAROXABAN 20 MG PO TABS
20.0000 mg | ORAL_TABLET | Freq: Every day | ORAL | Status: DC
  Administered 2016-10-14 – 2016-10-19 (×6): 20 mg via ORAL
  Filled 2016-10-14 (×6): qty 1

## 2016-10-14 MED ORDER — SODIUM CHLORIDE 0.9% FLUSH
3.0000 mL | Freq: Two times a day (BID) | INTRAVENOUS | Status: DC
  Administered 2016-10-15 – 2016-10-21 (×12): 3 mL via INTRAVENOUS

## 2016-10-14 MED ORDER — FUROSEMIDE 10 MG/ML IJ SOLN
20.0000 mg | Freq: Once | INTRAMUSCULAR | Status: AC
  Administered 2016-10-14: 20 mg via INTRAVENOUS
  Filled 2016-10-14: qty 2

## 2016-10-14 MED ORDER — ACETAMINOPHEN 325 MG PO TABS
650.0000 mg | ORAL_TABLET | ORAL | Status: DC | PRN
Start: 2016-10-14 — End: 2016-10-21
  Administered 2016-10-14 – 2016-10-16 (×4): 650 mg via ORAL
  Filled 2016-10-14 (×4): qty 2

## 2016-10-14 MED ORDER — FUROSEMIDE 10 MG/ML IJ SOLN
20.0000 mg | Freq: Every day | INTRAMUSCULAR | Status: DC
  Administered 2016-10-15 – 2016-10-18 (×4): 20 mg via INTRAVENOUS
  Filled 2016-10-14 (×5): qty 2

## 2016-10-14 MED ORDER — DILTIAZEM HCL-DEXTROSE 100-5 MG/100ML-% IV SOLN (PREMIX)
5.0000 mg/h | INTRAVENOUS | Status: DC
Start: 2016-10-14 — End: 2016-10-17
  Administered 2016-10-14: 5 mg/h via INTRAVENOUS
  Administered 2016-10-15: 10 mg/h via INTRAVENOUS
  Filled 2016-10-14 (×2): qty 100

## 2016-10-14 MED ORDER — SODIUM CHLORIDE 0.9% FLUSH: 3.0000 mL | INTRAVENOUS | Status: DC | PRN

## 2016-10-14 MED ORDER — AMIODARONE HCL 200 MG PO TABS
100.0000 mg | ORAL_TABLET | Freq: Every day | ORAL | Status: DC
  Administered 2016-10-15 – 2016-10-18 (×4): 100 mg via ORAL
  Filled 2016-10-14 (×5): qty 1

## 2016-10-14 MED ORDER — ONDANSETRON HCL 4 MG/2ML IJ SOLN: 4.0000 mg | Freq: Four times a day (QID) | INTRAMUSCULAR | Status: DC | PRN

## 2016-10-14 MED ORDER — OFF THE BEAT BOOK
Freq: Once | Status: AC
  Filled 2016-10-14: qty 1

## 2016-10-14 MED ORDER — SODIUM CHLORIDE 0.9 % IV SOLN
250.0000 mL | INTRAVENOUS | Status: DC | PRN
  Administered 2016-10-15: 10 mL/h via INTRAVENOUS

## 2016-10-14 NOTE — H&P (Addendum)
History and Physical  [name] HQP:[id number] DOB: 1932/10/01 DOA: 10/14/2016   PCP: Glenda Chroman, MD   Patient coming from: Home  Chief Complaint: Dyspnea on exertion  HPI:  Alejandro [name] is a 83 y.o. male with medical history of CAD, systolic and diastolic CHF, PAF on Xarelto, presents with 2-3 with history of worsening dyspnea on exertion. The patient states that he has been intermittently getting some chest discomfort with exertion, but denies any shortness of breath or chest discomfort at rest. He denies any fevers, chills, nausea, vomiting, diarrhea, coughing, hemoptysis. He denies any worsening lower action and edema, orthopnea or PND. The patient went to see his primary care provider on the day of admission, he was referred to the emergency department for further evaluation secondary to hypoxia. The patient is a poor historian secondary to cognitive impairment. He states that he has been weighing himself at home, and states that his weight has been stable. The patient states that he recently stopped taking his Florinef approximately one week prior to this admission secondary to dyspnea. Notably, the patient has a loop recorder that was recently interrogated by his cardiologist in April 2018. It showed several episodes of atrial fibrillation. In the emergency department, the patient was noted to be tachycardic with heart rate 110-120 and hypoxic with oxygen saturation 86% on room air. On 3 L nasal cannula, the patient was saturating 94-25 percent. The patient was given furosemide 20 mg IV 1. Chest x-ray showed bilateral interstitial thickening and suggestion of pulmonary vascular congestion. BNP and CBC were essentially unremarkable. D-dimer was 0.51. BNP was 794.0.  Assessment/Plan: Acute respiratory failure with hypoxia -I feel this is multifactorial with the main driver as decompensated CHF -His Afib RVR also driving his dyspnea on exertion -presently stable on  3L-->94-95% -check V/Q scan -check procalcitonin  Acute on chronic systolic and diastolic CHF -appears to have mild exacerbation -personally reviewed CXR--worsen vasc congestion vs 08/27/16 -daily weights -accurate I/O's -fluid restrict -Echo -continue IV Lasix 20 mg daily -06/06/2014 echo EF 50-55%, grade 1 DD  Chest discomfort -has atypical and typical component -cycle troponin -EKG-afib with incomplete LBBB  PAF--now with RVR -CHADS-VASc = 4 -continue Xarelto -start diltiazem drip  CKD stage 3 -baseline creatinine 1.1-1.4 -monitor with diuresis -am BMP      Past Medical History:  Diagnosis Date  . CAD (coronary artery disease)    Moderate multivessel March 2016  . CHF (congestive heart failure) ([place])   . Essential hypertension   . Memory loss   . NSVT (nonsustained ventricular tachycardia) ([name])    Negative EP study  . Paroxysmal atrial fibrillation ([name])   . Secondary cardiomyopathy ([place])    LVEF 40-45% January 2014  . Syncope   . TIA (transient ischemic attack)    Past Surgical History:  Procedure Laterality Date  . CATARACT EXTRACTION    . COLONOSCOPY  unknown  . COLONOSCOPY N/A 09/05/2014   Procedure: COLONOSCOPY;  Surgeon: [name], MD;  [facility];  Service: Endoscopy;  Laterality: N/A;  145pm  . ELECTROPHYSIOLOGY STUDY N/A 08/05/2014   Procedure: ELECTROPHYSIOLOGY STUDY;  Surgeon: Deboraha Sprang, MD;  Location: [facility] CATH LAB;  Service: Cardiovascular;  Laterality: N/A;  . LAPAROSCOPIC APPENDECTOMY  November 2015   Dr. [name]  . LEFT HEART CATHETERIZATION WITH CORONARY ANGIOGRAM N/A 07/31/2014   Procedure: LEFT HEART CATHETERIZATION WITH CORONARY ANGIOGRAM;  Surgeon: [name], MD;  Location: [place] CATH LAB;  Service: Cardiovascular;  Laterality: N/A;  . LOOP RECORDER IMPLANT     Social History:  reports that he has never smoked. He has never used smokeless tobacco. He reports that he does not drink alcohol or use  drugs.   Family History  Problem Relation Age of Onset  . Heart disease Father        Diagnosed in his 12s  . Arthritis Father   . Alzheimer's disease Mother      No Known Allergies   Prior to Admission medications   Medication Sig Start Date End Date Taking? Authorizing Provider  acetaminophen (TYLENOL) 500 MG tablet Take 1,000-1,500 mg by mouth every 6 (six) hours as needed for mild pain or moderate pain.   Yes [provider]  amiodarone (PACERONE) 200 MG tablet TAKE (1/2) TABLET BY MOUTH DAILY. 09/14/16  Yes Satira Sark, MD  Multiple Vitamins-Minerals (MULTIVITAMIN MEN 50+ PO) Take 1 tablet by mouth daily.   Yes [provider]  XARELTO 20 MG TABS tablet TAKE 1 TABLET DAILY WITH SUPPER----(STOP SAVAYSA). Patient taking differently: TAKE 1 TABLET DAILY WITH SUPPER 05/17/16  Yes Satira Sark, MD  fludrocortisone (FLORINEF) 0.1 MG tablet Take 1 tablet (0.1 mg total) by mouth daily. Patient not taking: Reported on 10/14/2016 09/27/16   Satira Sark, MD    Review of Systems:  Constitutional:  No weight loss, night sweats, Fevers, chills, fatigue.  Head&Eyes: No headache.  No vision loss.  No eye pain or scotoma ENT:  No Difficulty swallowing,Tooth/dental problems,Sore throat,  No ear ache, post nasal drip,  Cardio-vascular:  N, Orthopnea, PND, swelling in lower extremities,  dizziness, palpitations ; intemittent chest discomfot GI:  No  abdominal pain, nausea, vomiting, diarrhea, loss of appetite, hematochezia, melena, heartburn, indigestion, Resp:   No cough. No coughing up of blood .No wheezing.No chest wall deformity  Skin:  no rash or lesions.  GU:  no dysuria, change in color of urine, no urgency or frequency. No flank pain.  Musculoskeletal:  No joint pain or swelling. No decreased range of motion. No back pain.  Psych:  No change in mood or affect. No depression or anxiety. Neurologic: No headache, no dysesthesia, no focal  weakness, no vision loss. No syncope  Physical Exam: Vitals:   10/14/16 1600 10/14/16 1615 10/14/16 1630 10/14/16 1700  BP: 103/73  110/83 (!) 119/106  Pulse: (!) 52 (!) 47 85 (!) 49  Resp: 20 (!) 25 (!) 21 18  SpO2: 93% 93% (!) 86% 93%  Weight:      Height:       General:  A&O x 3, NAD, nontoxic, pleasant/cooperative Head/Eye: No conjunctival hemorrhage, no icterus, [place]/AT, No nystagmus ENT:  No icterus,  No thrush, good dentition, no pharyngeal exudate Neck:  No masses, no lymphadenpathy, no bruits CV:  RRR, no rub, no gallop, no S3 Lung:  Fine bibasilar crackles, no wheeze, no rhonchi Abdomen: soft/NT, +BS, nondistended, no peritoneal signs Ext: No cyanosis, No rashes, No petechiae, No lymphangitis, No edema Neuro: CNII-XII intact, strength 4/5 in bilateral upper and lower extremities, no dysmetria  Labs on Admission:  Basic Metabolic Panel:  Recent Labs Lab 10/14/16 1222  NA 138  K 4.0  CL 105  CO2 25  GLUCOSE 99  BUN 18  CREATININE 1.12  CALCIUM 8.7*   Liver Function Tests: No results for input(s): AST, ALT, ALKPHOS, BILITOT, PROT, ALBUMIN in the last 168 hours. No results for input(s): LIPASE, AMYLASE in the last 168 hours.  No results for input(s): AMMONIA in the last 168 hours. CBC:  Recent Labs Lab 10/14/16 1222  WBC 10.4  HGB 12.9*  HCT 39.5  MCV 98.0  PLT 330   Coagulation Profile: No results for input(s): INR, PROTIME in the last 168 hours. Cardiac Enzymes: No results for input(s): CKTOTAL, CKMB, CKMBINDEX, TROPONINI in the last 168 hours. BNP: Invalid input(s): POCBNP CBG: No results for input(s): GLUCAP in the last 168 hours. Urine analysis:    Component Value Date/Time   COLORURINE YELLOW 12/26/2014 0352   APPEARANCEUR CLEAR 12/26/2014 0352   LABSPEC 1.025 12/26/2014 0352   PHURINE 6.0 12/26/2014 0352   GLUCOSEU NEGATIVE 12/26/2014 0352   HGBUR NEGATIVE 12/26/2014 0352   BILIRUBINUR NEGATIVE 12/26/2014 0352   KETONESUR NEGATIVE  12/26/2014 0352   PROTEINUR NEGATIVE 12/26/2014 0352   UROBILINOGEN 0.2 12/26/2014 0352   NITRITE NEGATIVE 12/26/2014 0352   LEUKOCYTESUR SMALL (A) 12/26/2014 0352   Sepsis Labs: @LABRCNTIP (procalcitonin:4,lacticidven:4) )No results found for this or any previous visit (from the past 240 hour(s)).   Radiological Exams on Admission: Dg Chest 2 View  Result Date: 10/14/2016 CLINICAL DATA:  Shortness of breath on exertion for 2 weeks EXAM: CHEST  2 VIEW COMPARISON:  08/27/2016 FINDINGS: There is diffuse bilateral interstitial thickening. There is no focal parenchymal opacity. There is no pleural effusion or pneumothorax. There is stable cardiomegaly. The osseous structures are unremarkable. IMPRESSION: Cardiomegaly with mild pulmonary vascular congestion. Electronically Signed   By: Kathreen Devoid   On: 10/14/2016 12:50    EKG: Independently reviewed. AFib with RVR    Time spent:60 minutes Code Status:   FULL Family Communication:  No Family at bedside Disposition Plan: expect 1-2day hospitalization Consults called: none DVT Prophylaxis: [place] Lovenox  Berish Bohman, DO  [facility] Pager [phone]  If 7PM-7AM, please contact night-coverage [email or web address] Password [email or web address] 10/14/2016, 5:10 PM

## 2016-10-14 NOTE — ED Triage Notes (Signed)
Patient sent by Dr Woody Seller for CHF. Patient states new onset. Per patient started to become short of breath 2 weeks ago with exertion. Denies any chest pain, swelling, or cough. Patient does have hx of AFib.

## 2016-10-14 NOTE — ED Notes (Signed)
Pt.s HR is reading higher than pulse rate. Radial pulse taken by Dr. Eulis Foster. Noted to be in the 60's and sometimes 40's. Per Dr. Eulis Foster feels patient not perfusing with every beat therefore discrepancy between HR and pulse correct.

## 2016-10-14 NOTE — ED Notes (Signed)
Alejandro [name]- wife [phone]

## 2016-10-14 NOTE — Telephone Encounter (Signed)
-----   Message from Star Age, MD sent at 10/12/2016  7:47 AM EDT ----- Please call patient regarding the recent brain MRI: The brain scan showed a normal structure of the brain and moderate volume loss which we call atrophy. There were changes in the deeper structures of the brain, which we call white matter changes or microvascular changes. These were reported as moderate in His case. These are tiny white spots, that occur with time and are seen in a variety of conditions, including with normal aging, chronic hypertension, chronic headaches, especially migraine HAs, chronic diabetes, chronic hyperlipidemia. These are not strokes and no mass or lesions.  He does have evidence of old strokes, which were evident in the MRI before, in 2016.  Again, there were no acute findings, such as a new or recent stroke, or mass or blood products. No further action is required on this test at this time, other than re-enforcing the importance of good blood pressure control, good cholesterol control, good blood sugar control, and points we discussed during recent appt. Thanks,  Star Age, MD, PhD

## 2016-10-14 NOTE — Telephone Encounter (Signed)
Patient is currently in the ED, I will call back later to give him his results.

## 2016-10-14 NOTE — ED Provider Notes (Signed)
[facility] DEPT Provider Note   CSN: [id number] Arrival date & time: 10/14/16  1201     History   Chief Complaint Chief Complaint  Patient presents with  . Shortness of Breath    HPI Alejandro [name] is a 83 y.o. male.  Patient is here to be evaluated for congestive heart failure.  No history of same.  He saw his PCP this morning, who was concerned that he might have developed heart failure.  Patient began to be more more dyspneic upon exertion, 2 weeks ago, this was associated with difficulty sleeping at night.  He does not describe nocturnal orthopnea.  The patient is a poor historian, his wife states that he has "Alzheimer's".  The patient has had decreased appetite for several months.  He is taking his usual medications.  There is been no report of chest pain, focal weakness, complaints of headache or back pain.  Level 5 caveat-altered mental status   HPI  Past Medical History:  Diagnosis Date  . CAD (coronary artery disease)    Moderate multivessel March 2016  . CHF (congestive heart failure) ([place])   . Essential hypertension   . Memory loss   . NSVT (nonsustained ventricular tachycardia) ([name])    Negative EP study  . Paroxysmal atrial fibrillation ([name])   . Secondary cardiomyopathy ([place])    LVEF 40-45% January 2014  . Syncope   . TIA (transient ischemic attack)     Patient Active Problem List   Diagnosis Date Noted  . Heme positive stool 08/06/2016  . Orthostatic hypotension 12/25/2014  . Right sided weakness 12/25/2014  . HLD (hyperlipidemia) 12/25/2014  . Hypotension 12/25/2014  . AKI (acute kidney injury) ([place]) 12/25/2014  . A-fib ([place]) 12/25/2014  . History of colonic polyps   . Rectal bleeding 09/03/2014  . Chronic anticoagulation 09/03/2014  . Chest tightness 08/13/2014  . Chest pain 08/13/2014  . Dizziness 08/13/2014  . Episodic lightheadedness   . NSVT (nonsustained ventricular tachycardia) ([place]) 08/05/2014  . Congestive dilated cardiomyopathy  ([place]) 08/05/2014  . Syncope 08/05/2014  . Atrial fibrillation with RVR ([place]) 07/30/2014  . Secondary cardiomyopathy ([place]) 06/04/2014  . Essential hypertension 05/02/2013    Past Surgical History:  Procedure Laterality Date  . CATARACT EXTRACTION    . COLONOSCOPY  unknown  . COLONOSCOPY N/A 09/05/2014   Procedure: COLONOSCOPY;  Surgeon: [name], MD;  [facility];  Service: Endoscopy;  Laterality: N/A;  145pm  . ELECTROPHYSIOLOGY STUDY N/A 08/05/2014   Procedure: ELECTROPHYSIOLOGY STUDY;  Surgeon: Deboraha Sprang, MD;  [facility] C[facility] LAB;  Service: Cardiovascular;  Laterality: N/A;  . LAPAROSCOPIC APPENDECTOMY  November 2015   Dr. [name]  . LEFT HEART CATHETERIZATION WITH CORONARY ANGIOGRAM N/A 07/31/2014   Procedure: LEFT HEART CATHETERIZATION WITH CORONARY ANGIOGRAM;  Surgeon: [name], MD;  Location: [facility] CATH LAB;  Service: Cardiovascular;  Laterality: N/A;  . LOOP RECORDER IMPLANT         Home Medications    Prior to Admission medications   Medication Sig Start Date End Date Taking? Authorizing Provider  acetaminophen (TYLENOL) 500 MG tablet Take 1,000-1,500 mg by mouth every 6 (six) hours as needed for mild pain or moderate pain.   Yes [provider]  amiodarone (PACERONE) 200 MG tablet TAKE (1/2) TABLET BY MOUTH DAILY. 09/14/16  Yes Satira Sark, MD  Multiple Vitamins-Minerals (MULTIVITAMIN MEN 50+ PO) Take 1 tablet by mouth daily.   Yes [provider]  XARELTO 20 MG  TABS tablet TAKE 1 TABLET DAILY WITH SUPPER----(STOP SAVAYSA). Patient taking differently: TAKE 1 TABLET DAILY WITH SUPPER 05/17/16  Yes Satira Sark, MD  fludrocortisone (FLORINEF) 0.1 MG tablet Take 1 tablet (0.1 mg total) by mouth daily. Patient not taking: Reported on 10/14/2016 09/27/16   Satira Sark, MD    Family History Family History  Problem Relation Age of Onset  . Heart disease Father        Diagnosed in his 61s  . Arthritis Father   .  Alzheimer's disease Mother     Social History Social History  Substance Use Topics  . Smoking status: Never Smoker  . Smokeless tobacco: Never Used  . Alcohol use No     Allergies   Patient has no known allergies.   Review of Systems Review of Systems  Unable to perform ROS: Mental status change     Physical Exam Updated Vital Signs BP 110/83   Pulse 85   Resp (!) 21   Ht 5\' 8"  (1.727 m)   Wt 185 lb (83.9 kg)   SpO2 (!) 86%   BMI 28.13 kg/m   Physical Exam  Constitutional: He appears well-developed.  Elderly, frail  HENT:  Head: Normocephalic and atraumatic.  Right Ear: External ear normal.  Left Ear: External ear normal.  Eyes: Conjunctivae and EOM are normal. Pupils are equal, round, and reactive to light.  Neck: Normal range of motion and phonation normal. Neck supple.  Cardiovascular: Normal rate, regular rhythm and normal heart sounds.   No JVD, no peripheral edema  Pulmonary/Chest: Effort normal and breath sounds normal. No respiratory distress. He has no rales. He exhibits no bony tenderness.  Abdominal: Soft. There is no tenderness.  Musculoskeletal: Normal range of motion. He exhibits no edema, tenderness or deformity.  Neurological: He is alert. No cranial nerve deficit or sensory deficit. He exhibits normal muscle tone. Coordination normal.  No dysarthria, aphasia, or nystagmus.  Skin: Skin is warm, dry and intact.  Psychiatric: He has a normal mood and affect. His behavior is normal.  Nursing note and vitals reviewed.    ED Treatments / Results  Labs (all labs ordered are listed, but only abnormal results are displayed) Labs Reviewed  BASIC METABOLIC PANEL - Abnormal; Notable for the following:       Result Value   Calcium 8.7 (*)    GFR calc non Af Amer 59 (*)    All other components within normal limits  CBC - Abnormal; Notable for the following:    RBC 4.03 (*)    Hemoglobin 12.9 (*)    All other components within normal limits  BRAIN  NATRIURETIC PEPTIDE - Abnormal; Notable for the following:    B Natriuretic Peptide 794.0 (*)    All other components within normal limits  D-DIMER, QUANTITATIVE (NOT AT [facility]) - Abnormal; Notable for the following:    D-Dimer, Quant 0.51 (*)    All other components within normal limits  I-STAT TROPOININ, ED    EKG  EKG Interpretation  Date/Time:  Thursday Oct 14 2016 12:17:17 EDT Ventricular Rate:  112 PR Interval:    QRS Duration: 114 QT Interval:  380 QTC Calculation: 519 R Axis:   -52 Text Interpretation:  Atrial fibrillation Incomplete left bundle branch block Prolonged QT interval Since last tracing Atrial fibrillation has occured and QT longer Confirmed by Daleen Bo [id number]) on 10/14/2016 1:37:38 PM       Radiology Dg Chest 2 View  Result Date: 10/14/2016  CLINICAL DATA:  Shortness of breath on exertion for 2 weeks EXAM: CHEST  2 VIEW COMPARISON:  08/27/2016 FINDINGS: There is diffuse bilateral interstitial thickening. There is no focal parenchymal opacity. There is no pleural effusion or pneumothorax. There is stable cardiomegaly. The osseous structures are unremarkable. IMPRESSION: Cardiomegaly with mild pulmonary vascular congestion. Electronically Signed   By: Kathreen Devoid   On: 10/14/2016 12:50    Procedures Procedures (including critical care time)  Medications Ordered in ED Medications  furosemide (LASIX) injection 20 mg (20 mg Intravenous Given 10/14/16 1427)     Initial Impression / Assessment and Plan / ED Course  I have reviewed the triage vital signs and the nursing notes.  Pertinent labs & imaging results that were available during my care of the patient were reviewed by me and considered in my medical decision making (see chart for details).  Clinical Course as of Oct 15 1646  Thu Oct 14, 2016  1235 Sent for CHF evaluation. No hx same. No clinical CHF/overload. Onset 2 weeks ago.  [EW]  1645 Patient is currently comfortable, has diuresed, months,  and some injured, since receiving IV Lasix.  At this time on room air his oxygen saturation is 91%.  Heart rate is persistently elevated 110-115, with a palpable pulse at the right radius, in the mid 60s.  There are also notable week pulses, inter-dispersed with strong pulses.  Patient is clearly not perfusing, at the heart rate which is on the cardiac monitor.  [EW]    Clinical Course User Index [EW] Daleen Bo, MD   CHA2DS2/VAS Stroke Risk Points      7 >= 2 Points: High Risk  1 - 1.99 Points: Medium Risk  0 Points: Low Risk    The patient's score has not changed in the past year.:  No Change         Details    Note: External data might be a factor in metrics not marked with    Points Metrics   This score determines the patient's risk of having a stroke if the  patient has atrial fibrillation.       1 Has Congestive Heart Failure:  Yes   1 Has Vascular Disease:  Yes   1 Has Hypertension:  Yes   2 Age:  54   0 Has Diabetes:  No   2 Had Stroke:  Yes Had TIA:  Yes Had thromboembolism:  No   0 Male:  No         Medications  furosemide (LASIX) injection 20 mg (20 mg Intravenous Given 10/14/16 1427)    Patient Vitals for the past 24 hrs:  BP Pulse Resp SpO2 Height Weight  10/14/16 1630 110/83 85 (!) 21 (!) 86 % - -  10/14/16 1615 - (!) 47 (!) 25 93 % - -  10/14/16 1600 103/73 (!) 52 20 93 % - -  10/14/16 1530 104/64 100 19 95 % - -  10/14/16 1430 117/75 (!) 104 (!) 24 95 % - -  10/14/16 1400 107/84 67 (!) 22 93 % - -  10/14/16 1345 - 99 (!) 25 95 % - -  10/14/16 1330 (!) 102/91 (!) 53 (!) 23 94 % - -  10/14/16 1315 - 97 (!) 21 94 % - -  10/14/16 1311 106/77 (!) 42 (!) 24 94 % - -  10/14/16 1300 95/84 99 (!) 24 97 % - -  10/14/16 1245 - (!) 59 20 95 % - -  10/14/16 1230 108/83 (!) 47 (!) 23 92 % - -  10/14/16 1216 - - - - 5\' 8"  (1.727 m) 185 lb (83.9 kg)    4:46 PM Reevaluation with update and discussion. After initial assessment and treatment, an updated evaluation  reveals patient is comfortable at this time.  He has no further complaints.Daleen Bo L    4:48 PM-Consult complete with hospitalist. Patient case explained and discussed.  He agrees to admit patient for further evaluation and treatment. Call ended at 53: 44  CRITICAL CARE Performed by: Daleen Bo L Total critical care time: 35 minutes Critical care time was exclusive of separately billable procedures and treating other patients. Critical care was necessary to treat or prevent imminent or life-threatening deterioration. Critical care was time spent personally by me on the following activities: development of treatment plan with patient and/or surrogate as well as nursing, discussions with consultants, evaluation of patient's response to treatment, examination of patient, obtaining history from patient or surrogate, ordering and performing treatments and interventions, ordering and review of laboratory studies, ordering and review of radiographic studies, pulse oximetry and re-evaluation of patient's condition.   Final Clinical Impressions(s) / ED Diagnoses   Final diagnoses:  Atrial fibrillation with RVR ([name])  Congestive heart failure, unspecified HF chronicity, unspecified heart failure type ([place])  Hypoxia    Recurrent atrial fibrillation, with rapid ventricular rate, and borderline blood pressure.  Oxygenation is borderline.  History of cardiomyopathy, but no recent cardiac echo.  Last echo in 2016, EF 50-55% with left ventricular hypertrophy.  Doubt ACS or pneumonia.  D-dimer minimally elevated but age-adjusted normal.  Doubt PE.  Patient is currently anticoagulated.  He currently takes amiodarone.  Patient's rate did not normalize after initial diuresis.  Patient will require admission for diuresis, cardiac echo, rate stabilization, and likely cardiology consultation.  Nursing Notes Reviewed/ Care Coordinated Applicable Imaging Reviewed Interpretation of Laboratory Data  incorporated into ED treatment   Plan: Admit  New Prescriptions New Prescriptions   No medications on file     Daleen Bo, MD 10/14/16 1656

## 2016-10-15 ENCOUNTER — Inpatient Hospital Stay (HOSPITAL_COMMUNITY): Payer: Medicare Other

## 2016-10-15 DIAGNOSIS — R0609 Other forms of dyspnea: Secondary | ICD-10-CM

## 2016-10-15 DIAGNOSIS — I340 Nonrheumatic mitral (valve) insufficiency: Secondary | ICD-10-CM

## 2016-10-15 LAB — CBC
HEMATOCRIT: 36.7 % — AB (ref 39.0–52.0)
Hemoglobin: 12.2 g/dL — ABNORMAL LOW (ref 13.0–17.0)
MCH: 32.3 pg (ref 26.0–34.0)
MCHC: 33.2 g/dL (ref 30.0–36.0)
MCV: 97.1 fL (ref 78.0–100.0)
PLATELETS: 312 10*3/uL (ref 150–400)
RBC: 3.78 MIL/uL — AB (ref 4.22–5.81)
RDW: 13.8 % (ref 11.5–15.5)
WBC: 8.1 10*3/uL (ref 4.0–10.5)

## 2016-10-15 LAB — BASIC METABOLIC PANEL
ANION GAP: 7 (ref 5–15)
BUN: 17 mg/dL (ref 6–20)
CO2: 26 mmol/L (ref 22–32)
Calcium: 8.6 mg/dL — ABNORMAL LOW (ref 8.9–10.3)
Chloride: 104 mmol/L (ref 101–111)
Creatinine, Ser: 1.18 mg/dL (ref 0.61–1.24)
GFR, EST NON AFRICAN AMERICAN: 56 mL/min — AB (ref >60)
GLUCOSE: 95 mg/dL (ref 65–99)
POTASSIUM: 3.7 mmol/L (ref 3.5–5.1)
Sodium: 137 mmol/L (ref 135–145)

## 2016-10-15 LAB — TROPONIN I
TROPONIN I: 0.04 ng/mL — AB (ref <0.03)
Troponin I: 0.04 ng/mL (ref <0.03)

## 2016-10-15 LAB — ECHOCARDIOGRAM COMPLETE
HEIGHTINCHES: 68 in
Weight: 2836 oz

## 2016-10-15 MED ORDER — LIVING BETTER WITH HEART FAILURE BOOK: Freq: Once | Status: AC

## 2016-10-15 MED ORDER — TECHNETIUM TO 99M ALBUMIN AGGREGATED
4.0000 | Freq: Once | INTRAVENOUS | Status: AC | PRN
  Administered 2016-10-15: 4.1 via INTRAVENOUS

## 2016-10-15 MED ORDER — TECHNETIUM TC 99M DIETHYLENETRIAME-PENTAACETIC ACID
30.0000 | Freq: Once | INTRAVENOUS | Status: AC | PRN
  Administered 2016-10-15: 30 via RESPIRATORY_TRACT

## 2016-10-15 NOTE — Progress Notes (Signed)
*  PRELIMINARY RESULTS* Echocardiogram 2D Echocardiogram has been performed.  Alejandro [name] 10/15/2016, 10:31 AM

## 2016-10-15 NOTE — Progress Notes (Signed)
Md notified of critical troponin of 0.04 at 0150. No new orders.

## 2016-10-15 NOTE — Progress Notes (Signed)
PROGRESS NOTE    Alejandro [name]  GXQ:[id number] DOB: 01/24/1933 DOA: 10/14/2016 PCP: Glenda Chroman, MD    Brief Narrative:  Alejandro [name] is a 83 y.o. male with medical history of CAD, systolic and diastolic CHF, PAF on Xarelto, presents with 2-3 with history of worsening dyspnea on exertion. The patient states that he has been intermittently getting some chest discomfort with exertion, but denies any shortness of breath or chest discomfort at rest. He denies any fevers, chills, nausea, vomiting, diarrhea, coughing, hemoptysis. He denies any worsening lower action and edema, orthopnea or PND. The patient went to see his primary care provider on the day of admission, he was referred to the emergency department for further evaluation secondary to hypoxia. The patient is a poor historian secondary to cognitive impairment. He states that he has been weighing himself at home, and states that his weight has been stable. The patient states that he recently stopped taking his Florinef approximately one week prior to this admission secondary to dyspnea. Notably, the patient has a loop recorder that was recently interrogated by his cardiologist in April 2018. It showed several episodes of atrial fibrillation. In the emergency department, the patient was noted to be tachycardic with heart rate 110-120 and hypoxic with oxygen saturation 86% on room air. On 3 L nasal cannula, the patient was saturating 94-25 percent. The patient was given furosemide 20 mg IV 1. Chest x-ray showed bilateral interstitial thickening and suggestion of pulmonary vascular congestion. BNP and CBC were essentially unremarkable. D-dimer was 0.51. BNP was 794.0.   Assessment & Plan:   Active Problems:   Atrial fibrillation with RVR ([name])   Chronic anticoagulation   Acute on chronic combined systolic and diastolic CHF (congestive heart failure) ([name])   Paroxysmal A-fib ([name])   Acute respiratory failure with hypoxia ([name])   CKD  (chronic kidney disease) stage 3, GFR 30-59 ml/min   Acute respiratory failure with hypoxia - V/Q scan ordered and pending - elevated D- dimer - procalcitonin <0.10  Acute on chronic systolic and diastolic CHF -daily weights - BNP of 794 at admission -accurate I/O's (thus far -864ml) -fluid restrict -Echo ordered -IV lasix 20mg  IV daily for now -06/06/2014 echo EF 50-55%, grade 1 DD  Chest discomfort -has atypical and typical component - troponin plateau -EKG-afib with incomplete LBBB  PAF--now with RVR -CHADS-VASc = 4 -continue Xarelto -transition off diltiazem drip and restart home medication of amiodarone  CKD stage 3 -baseline creatinine 1.1-1.4 -monitor with diuresis -am BMP   DVT prophylaxis: [place] Lovenox Code Status: Full Code Family Communication: no family is bedside Disposition Plan: Likely discharge back to previous environment when improved   Consultants:   None  Procedures:   Echocardiogram planned for today  V/Q scan  Antimicrobials:   None    Subjective: Patient reports he feels relatively well but states he hasn't gotten up yet to test this.  Was laying flat sleeping at time of exam.  Reports a minor improvement in shortness of breath.  Objective: Vitals:   10/15/16 0700 10/15/16 0715 10/15/16 0730 10/15/16 0745  BP: 107/69 104/73 102/66 111/73  Pulse: 69 76 76 71  Resp: (!) 22 20 16 20   Temp:      TempSrc:      SpO2:  93% 95% 95%  Weight:      Height:        Intake/Output Summary (Last 24 hours) at 10/15/16 0807 Last data filed at 10/15/16 1448  Gross per  24 hour  Intake           163.41 ml  Output             1000 ml  Net          -836.59 ml   Filed Weights   10/14/16 1216 10/14/16 1839  Weight: 83.9 kg (185 lb) 80.4 kg (177 lb 4 oz)    Examination:  General exam: Appears calm and comfortable  Respiratory system: few scattered crackles bilaterally, no increase work of breathing, no rhonchi, no accessory muscle  use Cardiovascular system: S1 & S2 heard, irregularly irregular. No JVD, murmurs, rubs, gallops or clicks. No pedal edema. Gastrointestinal system: Abdomen is nondistended, soft and nontender. No organomegaly or masses felt. Normal bowel sounds heard. Central nervous system: Alert and oriented to self, place, situation. No focal neurological deficits. Extremities: Symmetric 4 x 5 power. Skin: No rashes, lesions or ulcers Psychiatry: Mood & affect appropriate.     Data Reviewed: I have personally reviewed following labs and imaging studies  CBC:  Recent Labs Lab 10/14/16 1222 10/15/16 0555  WBC 10.4 8.1  HGB 12.9* 12.2*  HCT 39.5 36.7*  MCV 98.0 97.1  PLT 330 811   Basic Metabolic Panel:  Recent Labs Lab 10/14/16 1222 10/15/16 0555  NA 138 137  K 4.0 3.7  CL 105 104  CO2 25 26  GLUCOSE 99 95  BUN 18 17  CREATININE 1.12 1.18  CALCIUM 8.7* 8.6*   GFR: Estimated Creatinine Clearance: 46.7 mL/min (by C-G formula based on SCr of 1.18 mg/dL). Liver Function Tests: No results for input(s): AST, ALT, ALKPHOS, BILITOT, PROT, ALBUMIN in the last 168 hours. No results for input(s): LIPASE, AMYLASE in the last 168 hours. No results for input(s): AMMONIA in the last 168 hours. Coagulation Profile: No results for input(s): INR, PROTIME in the last 168 hours. Cardiac Enzymes:  Recent Labs Lab 10/14/16 1825 10/15/16 0007 10/15/16 0555  TROPONINI 0.03* 0.04* 0.04*   BNP (last 3 results) No results for input(s): PROBNP in the last 8760 hours. HbA1C: No results for input(s): HGBA1C in the last 72 hours. CBG: No results for input(s): GLUCAP in the last 168 hours. Lipid Profile: No results for input(s): CHOL, HDL, LDLCALC, TRIG, CHOLHDL, LDLDIRECT in the last 72 hours. Thyroid Function Tests: No results for input(s): TSH, T4TOTAL, FREET4, T3FREE, THYROIDAB in the last 72 hours. Anemia Panel: No results for input(s): VITAMINB12, FOLATE, FERRITIN, TIBC, IRON, RETICCTPCT in  the last 72 hours. Sepsis Labs:  Recent Labs Lab 10/14/16 1825  PROCALCITON <0.10    Recent Results (from the past 240 hour(s))  MRSA PCR Screening     Status: None   Collection Time: 10/14/16  6:22 PM  Result Value Ref Range Status   MRSA by PCR NEGATIVE NEGATIVE Final    Comment:        The GeneXpert MRSA Assay (FDA approved for NASAL specimens only), is one component of a comprehensive MRSA colonization surveillance program. It is not intended to diagnose MRSA infection nor to guide or monitor treatment for MRSA infections.          Radiology Studies: Dg Chest 2 View  Result Date: 10/14/2016 CLINICAL DATA:  Shortness of breath on exertion for 2 weeks EXAM: CHEST  2 VIEW COMPARISON:  08/27/2016 FINDINGS: There is diffuse bilateral interstitial thickening. There is no focal parenchymal opacity. There is no pleural effusion or pneumothorax. There is stable cardiomegaly. The osseous structures are unremarkable. IMPRESSION: Cardiomegaly with  mild pulmonary vascular congestion. Electronically Signed   By: Kathreen Devoid   On: 10/14/2016 12:50        Scheduled Meds: . amiodarone  100 mg Oral Daily  . furosemide  20 mg Intravenous Daily  . Living Better with Heart Failure Book   Does not apply Once  . off the beat book   Does not apply Once  . rivaroxaban  20 mg Oral Q supper  . sodium chloride flush  3 mL Intravenous Q12H   Continuous Infusions: . sodium chloride 10 mL/hr (10/15/16 0122)  . diltiazem (CARDIZEM) infusion 5 mg/hr (10/15/16 0600)     LOS: 1 day    Time spent: 30 minutes    Loretha Stapler, MD [facility] Pager [phone]  If 7PM-7AM, please contact night-coverage [email or web address] Password [email or web address] 10/15/2016, 8:07 AM

## 2016-10-15 NOTE — Care Management Important Message (Signed)
Important Message  Patient Details  Name: Alejandro [name] MRN: [id number] Date of Birth: 1933/04/10   Medicare Important Message Given:  Yes    Kysean Sweet, Chauncey [place], RN 10/15/2016, 1:18 PM

## 2016-10-15 NOTE — Care Management Note (Signed)
Case Management Note  Patient Details  Name: Alejandro [name] MRN: [id number] Date of Birth: 02/12/1933  Subjective/Objective: Adm with CHF. From home, ind PTA. No HH or DME PTA. Was driving until a month ago, wife drives him to appointments. Has PCP and reports no issues affording medications. Declines need for home health. Currently on 2L of oxygen.                    Action/Plan: Anticipate DC home with self care. Anticipate patient will be able to wean to room air.    Expected Discharge Date:     10/17/2016             Expected Discharge Plan:  Home/Self Care  In-House Referral:     Discharge planning Services  CM Consult  Post Acute Care Choice:    Choice offered to:  NA  DME Arranged:    DME Agency:     HH Arranged:    HH Agency:     Status of Service:  In process, will continue to follow  If discussed at Long Length of Stay Meetings, dates discussed:    Additional Comments:  Kateena Degroote, Chauncey [place], RN 10/15/2016, 1:15 PM

## 2016-10-16 DIAGNOSIS — R0902 Hypoxemia: Secondary | ICD-10-CM

## 2016-10-16 LAB — PROCALCITONIN: Procalcitonin: <0.1 ng/mL

## 2016-10-16 LAB — BASIC METABOLIC PANEL
Anion gap: 8 (ref 5–15)
BUN: 25 mg/dL — ABNORMAL HIGH (ref 6–20)
CALCIUM: 8.7 mg/dL — AB (ref 8.9–10.3)
CO2: 26 mmol/L (ref 22–32)
CREATININE: 1.24 mg/dL (ref 0.61–1.24)
Chloride: 104 mmol/L (ref 101–111)
GFR calc Af Amer: >60 mL/min (ref >60)
GFR calc non Af Amer: 52 mL/min — ABNORMAL LOW (ref >60)
GLUCOSE: 106 mg/dL — AB (ref 65–99)
Potassium: 3.7 mmol/L (ref 3.5–5.1)
Sodium: 138 mmol/L (ref 135–145)

## 2016-10-16 NOTE — Progress Notes (Signed)
Patient up sitting in the recliner. Spouse at the bedside. Vital signs are stable. Saline lock patent. Report given to Janett Billow, Therapist, sports. Patient transferred to dept 300 via wheelchair to room 341.

## 2016-10-16 NOTE — Progress Notes (Addendum)
PROGRESS NOTE    Alejandro [name]  PJK:[id number] DOB: 12/04/1932 DOA: 10/14/2016 PCP: Glenda Chroman, MD    Brief Narrative:  Alejandro [name] is a 83 y.o. male with medical history of CAD, systolic and diastolic CHF, PAF on Xarelto, presents with 2-3 with history of worsening dyspnea on exertion. The patient states that he has been intermittently getting some chest discomfort with exertion, but denies any shortness of breath or chest discomfort at rest. He denies any fevers, chills, nausea, vomiting, diarrhea, coughing, hemoptysis. He denies any worsening lower action and edema, orthopnea or PND. The patient went to see his primary care provider on the day of admission, he was referred to the emergency department for further evaluation secondary to hypoxia. The patient is a poor historian secondary to cognitive impairment. He states that he has been weighing himself at home, and states that his weight has been stable. The patient states that he recently stopped taking his Florinef approximately one week prior to this admission secondary to dyspnea. Notably, the patient has a loop recorder that was recently interrogated by his cardiologist in April 2018. It showed several episodes of atrial fibrillation. In the emergency department, the patient was noted to be tachycardic with heart rate 110-120 and hypoxic with oxygen saturation 86% on room air. On 3 L nasal cannula, the patient was saturating 94-25 percent. The patient was given furosemide 20 mg IV 1. Chest x-ray showed bilateral interstitial thickening and suggestion of pulmonary vascular congestion. BNP and CBC were essentially unremarkable. D-dimer was 0.51. BNP was 794.0.   Assessment & Plan:   Active Problems:   Atrial fibrillation with RVR ([name])   Chronic anticoagulation   Acute on chronic combined systolic and diastolic CHF (congestive heart failure) ([name])   Paroxysmal A-fib ([name])   Acute respiratory failure with hypoxia ([name])   CKD  (chronic kidney disease) stage 3, GFR 30-59 ml/min   Acute respiratory failure with hypoxia - V/Q scan low probability for PE  Acute on chronic systolic and diastolic CHF -daily weights - BNP of 794 at admission -accurate I/O's (net -942ml yesterday) -fluid restrict -Echo showing slightly decreased EF -IV lasix 20mg  IV daily for now - has not tolerated BB or ARBs in past per Dr. Myles Gip last note  Chest discomfort - troponins plateau -EKG-afib with incomplete LBBB  PAF--now with RVR -CHADS-VASc = 4 -continue Xarelto -continue amiodarone - may need increase pending heart rate  CKD stage 3 -baseline creatinine 1.1-1.4 -monitor with diuresis -qam BMP   DVT prophylaxis: [place] Lovenox Code Status: Full Code Family Communication: no family is bedside Disposition Plan: Likely discharge back to previous environment when improved   Consultants:   None  Procedures:   Echocardiogram  V/Q scan  Antimicrobials:   None    Subjective: Patient reports he feels well thus far.  Says his breathing is slightly improved but he has not gotten up to ambulate and that is when his dyspnea worsens.  Heart rate still in 110s.  Objective: Vitals:   10/16/16 1300 10/16/16 1400 10/16/16 1500 10/16/16 1600  BP: 103/71 100/67 96/68 108/65  Pulse: 83 93 97 93  Resp: (!) 24 (!) 22 (!) 22 (!) 22  Temp:      TempSrc:      SpO2: 96% 93% 92% 93%  Weight:      Height:        Intake/Output Summary (Last 24 hours) at 10/16/16 1649 Last data filed at 10/16/16 1406  Gross per 24  hour  Intake                0 ml  Output             1300 ml  Net            -1300 ml   Filed Weights   10/14/16 1216 10/14/16 1839 10/16/16 0400  Weight: 83.9 kg (185 lb) 80.4 kg (177 lb 4 oz) 78.4 kg (172 lb 13.5 oz)    Examination: General exam: Appears calm and comfortable  Respiratory system: bibasilar rales, no increase work of breathing, no rhonchi, no accessory muscle use Cardiovascular  system: S1 & S2 heard, irregularly irregular and tachycardic. No JVD, murmurs, rubs, gallops or clicks. No pedal edema. Gastrointestinal system: Abdomen is nondistended, soft and nontender. No organomegaly or masses felt. Normal bowel sounds heard. Central nervous system: Alert and oriented to self, place, situation. No focal neurological deficits. Extremities: Symmetric 4 x 5 power. Skin: No rashes, lesions or ulcers Psychiatry: Mood & affect appropriate      Data Reviewed: I have personally reviewed following labs and imaging studies  CBC:  Recent Labs Lab 10/14/16 1222 10/15/16 0555  WBC 10.4 8.1  HGB 12.9* 12.2*  HCT 39.5 36.7*  MCV 98.0 97.1  PLT 330 767   Basic Metabolic Panel:  Recent Labs Lab 10/14/16 1222 10/15/16 0555 10/16/16 0429  NA 138 137 138  K 4.0 3.7 3.7  CL 105 104 104  CO2 25 26 26   GLUCOSE 99 95 106*  BUN 18 17 25*  CREATININE 1.12 1.18 1.24  CALCIUM 8.7* 8.6* 8.7*   GFR: Estimated Creatinine Clearance: 44.4 mL/min (by C-G formula based on SCr of 1.24 mg/dL). Liver Function Tests: No results for input(s): AST, ALT, ALKPHOS, BILITOT, PROT, ALBUMIN in the last 168 hours. No results for input(s): LIPASE, AMYLASE in the last 168 hours. No results for input(s): AMMONIA in the last 168 hours. Coagulation Profile: No results for input(s): INR, PROTIME in the last 168 hours. Cardiac Enzymes:  Recent Labs Lab 10/14/16 1825 10/15/16 0007 10/15/16 0555  TROPONINI 0.03* 0.04* 0.04*   BNP (last 3 results) No results for input(s): PROBNP in the last 8760 hours. HbA1C: No results for input(s): HGBA1C in the last 72 hours. CBG: No results for input(s): GLUCAP in the last 168 hours. Lipid Profile: No results for input(s): CHOL, HDL, LDLCALC, TRIG, CHOLHDL, LDLDIRECT in the last 72 hours. Thyroid Function Tests: No results for input(s): TSH, T4TOTAL, FREET4, T3FREE, THYROIDAB in the last 72 hours. Anemia Panel: No results for input(s):  VITAMINB12, FOLATE, FERRITIN, TIBC, IRON, RETICCTPCT in the last 72 hours. Sepsis Labs:  Recent Labs Lab 10/14/16 1825 10/16/16 0429  PROCALCITON <0.10 <0.10    Recent Results (from the past 240 hour(s))  MRSA PCR Screening     Status: None   Collection Time: 10/14/16  6:22 PM  Result Value Ref Range Status   MRSA by PCR NEGATIVE NEGATIVE Final    Comment:        The GeneXpert MRSA Assay (FDA approved for NASAL specimens only), is one component of a comprehensive MRSA colonization surveillance program. It is not intended to diagnose MRSA infection nor to guide or monitor treatment for MRSA infections.          Radiology Studies: Nm Pulmonary Perf And Vent  Result Date: 10/15/2016 CLINICAL DATA:  Shortness of breath.  Dizziness. EXAM: NUCLEAR MEDICINE VENTILATION - PERFUSION LUNG SCAN TECHNIQUE: Ventilation images were obtained in multiple  projections using inhaled aerosol Tc-2m DTPA. Perfusion images were obtained in multiple projections after intravenous injection of Tc-35m MAA. RADIOPHARMACEUTICALS:  30 mCi Technetium-62m DTPA aerosol inhalation and 4.1 mCi Technetium-34m MAA IV COMPARISON:  Chest x-ray 10/14/2016 FINDINGS: No significant ventilation perfusion mismatches are noted. Poor ventilation in the upper lobes, perfusion is better. Findings consist with low probability pulmonary embolus . IMPRESSION: Low probability pulmonary embolus. Poor ventilation in the upper lobes, this may be secondary to congestive heart failure. Electronically Signed   By: [place]   On: 10/15/2016 10:20        Scheduled Meds: . amiodarone  100 mg Oral Daily  . furosemide  20 mg Intravenous Daily  . Living Better with Heart Failure Book   Does not apply Once  . off the beat book   Does not apply Once  . rivaroxaban  20 mg Oral Q supper  . sodium chloride flush  3 mL Intravenous Q12H   Continuous Infusions: . sodium chloride 10 mL/hr (10/15/16 0122)  . diltiazem (CARDIZEM)  infusion Stopped (10/15/16 1130)     LOS: 2 days    Time spent: 30 minutes    Loretha Stapler, MD [facility] Pager [phone]  If 7PM-7AM, please contact night-coverage [email or web address] Password [facility] 10/16/2016, 4:49 PM

## 2016-10-17 LAB — BASIC METABOLIC PANEL
ANION GAP: 9 (ref 5–15)
BUN: 30 mg/dL — AB (ref 6–20)
CO2: 27 mmol/L (ref 22–32)
CREATININE: 1.21 mg/dL (ref 0.61–1.24)
Calcium: 9 mg/dL (ref 8.9–10.3)
Chloride: 102 mmol/L (ref 101–111)
GFR calc Af Amer: >60 mL/min (ref >60)
GFR calc non Af Amer: 54 mL/min — ABNORMAL LOW (ref >60)
Glucose, Bld: 106 mg/dL — ABNORMAL HIGH (ref 65–99)
POTASSIUM: 3.8 mmol/L (ref 3.5–5.1)
Sodium: 138 mmol/L (ref 135–145)

## 2016-10-17 LAB — CBC WITH DIFFERENTIAL/PLATELET
BASOS ABS: 0.1 10*3/uL (ref 0.0–0.1)
BASOS PCT: 1 %
EOS ABS: 1.2 10*3/uL — AB (ref 0.0–0.7)
EOS PCT: 10 %
HCT: 42.1 % (ref 39.0–52.0)
Hemoglobin: 13.7 g/dL (ref 13.0–17.0)
Lymphocytes Relative: 16 %
Lymphs Abs: 1.9 10*3/uL (ref 0.7–4.0)
MCH: 32.2 pg (ref 26.0–34.0)
MCHC: 32.5 g/dL (ref 30.0–36.0)
MCV: 99.1 fL (ref 78.0–100.0)
MONO ABS: 1.2 10*3/uL — AB (ref 0.1–1.0)
Monocytes Relative: 10 %
Neutro Abs: 7.5 10*3/uL (ref 1.7–7.7)
Neutrophils Relative %: 63 %
PLATELETS: 383 10*3/uL (ref 150–400)
RBC: 4.25 MIL/uL (ref 4.22–5.81)
RDW: 13.8 % (ref 11.5–15.5)
WBC: 11.8 10*3/uL — AB (ref 4.0–10.5)

## 2016-10-17 LAB — MAGNESIUM: MAGNESIUM: 2 mg/dL (ref 1.7–2.4)

## 2016-10-17 NOTE — Progress Notes (Signed)
Pt stating that he "just doesn't feel right and feels weak." Vitals obtained, WNL. BP running soft but that's been the baseline. MD made aware, will continue to monitor.

## 2016-10-17 NOTE — Progress Notes (Signed)
PROGRESS NOTE    Alejandro [name]  KGU:[id number] DOB: 1932-09-26 DOA: 10/14/2016 PCP: Glenda Chroman, MD    Brief Narrative:  Alejandro [name] is a 83 y.o. male with medical history of CAD, systolic and diastolic CHF, PAF on Xarelto, presents with 2-3 with history of worsening dyspnea on exertion. The patient states that he has been intermittently getting some chest discomfort with exertion, but denies any shortness of breath or chest discomfort at rest. He denies any fevers, chills, nausea, vomiting, diarrhea, coughing, hemoptysis. He denies any worsening lower action and edema, orthopnea or PND. The patient went to see his primary care provider on the day of admission, he was referred to the emergency department for further evaluation secondary to hypoxia. The patient is a poor historian secondary to cognitive impairment. He states that he has been weighing himself at home, and states that his weight has been stable. The patient states that he recently stopped taking his Florinef approximately one week prior to this admission secondary to dyspnea. Notably, the patient has a loop recorder that was recently interrogated by his cardiologist in April 2018. It showed several episodes of atrial fibrillation. In the emergency department, the patient was noted to be tachycardic with heart rate 110-120 and hypoxic with oxygen saturation 86% on room air. On 3 L nasal cannula, the patient was saturating 94-25 percent. The patient was given furosemide 20 mg IV 1. Chest x-ray showed bilateral interstitial thickening and suggestion of pulmonary vascular congestion. BNP and CBC were essentially unremarkable. D-dimer was 0.51. BNP was 794.0.  He was started in IV diuresis.  His heart rate was maintained in the 110s but his blood pressure was borderline soft.  Cardiology was then consulted for assistance in managing his atrial fibrillation in setting of hypotension.   Assessment & Plan:   Active Problems:   Atrial  fibrillation with RVR ([name])   Chronic anticoagulation   Acute on chronic combined systolic and diastolic CHF (congestive heart failure) ([name])   Paroxysmal A-fib ([name])   Acute respiratory failure with hypoxia ([name])   CKD (chronic kidney disease) stage 3, GFR 30-59 ml/min   Acute respiratory failure with hypoxia - V/Q scan low probability for PE - likely attributed to CHF  Acute on chronic systolic and diastolic CHF -daily weights (79.289>78.92kg) - BNP of 794 at admission -accurate I/O's (net -2.49L since admission) -fluid restrict -Echo showing slightly decreased EF -IV lasix 20mg  IV daily for now - has not tolerated BB or ARBs in past per Dr. Myles Gip last note  PAF--now with RVR -CHADS-VASc = 4 -continue Xarelto -continue amiodarone - heart rate on telemetry 110s but blood pressure soft- did not tolerate beta blockers in the past and hesistate to increase amiodarone with low BP (systolic BP of 62G-315V) - will consult cardiology in am  CKD stage 3 -baseline creatinine 1.1-1.4 -monitor with diuresis -qam BMP - creatinine of 1.21 this am   DVT prophylaxis: [place] Lovenox Code Status: Full Code Family Communication: no family is bedside Disposition Plan: Likely discharge back to previous environment when improved   Consultants:   None  Procedures:   Echocardiogram  V/Q scan  Antimicrobials:   None    Subjective: Patient reports this morning he feels about how he always feels but later today voices he does not "feel right". Upon going to see patient again this afternoon he was asleep.  Patient this am states he slept well.  Denied chest pain or chest discomfort.  He stated he  had his compression stockings removed because he did not tolerate how tight they were.  Objective: Vitals:   10/16/16 2203 10/17/16 0500 10/17/16 0555 10/17/16 1200  BP: 97/68  95/61 101/66  Pulse: 76  (!) 44 66  Resp: (!) 22  (!) 22 19  Temp: 98.6 F (37 C)  98.4 F (36.9 C)     TempSrc: Oral  Oral   SpO2: 94%  96% 96%  Weight:  78.9 kg (174 lb)    Height:        Intake/Output Summary (Last 24 hours) at 10/17/16 1432 Last data filed at 10/17/16 1227  Gross per 24 hour  Intake              240 ml  Output              600 ml  Net             -360 ml   Filed Weights   10/16/16 0400 10/16/16 1813 10/17/16 0500  Weight: 78.4 kg (172 lb 13.5 oz) 79.3 kg (174 lb 12.8 oz) 78.9 kg (174 lb)    Examination:  General exam:Appears calm and comfortable  Respiratory system: bibasilar rales, no increase work of breathing, no rhonchi, no accessory muscle use Cardiovascular system:S1 &S2 heard, irregularly irregular and tachycardic. No JVD, murmurs, rubs, gallops or clicks. No pedal edema. Gastrointestinal system:Abdomen is nondistended, soft and nontender. No organomegaly or masses felt. Normal bowel sounds heard. Central nervous system:Alert and oriented to self, place, situation. No focal neurological deficits. Extremities: Symmetric 4x 5 power. Skin: No rashes, lesions or ulcers Psychiatry:Mood&affect appropriate    Data Reviewed: I have personally reviewed following labs and imaging studies  CBC:  Recent Labs Lab 10/14/16 1222 10/15/16 0555 10/17/16 0619  WBC 10.4 8.1 11.8*  NEUTROABS  --   --  7.5  HGB 12.9* 12.2* 13.7  HCT 39.5 36.7* 42.1  MCV 98.0 97.1 99.1  PLT 330 312 062   Basic Metabolic Panel:  Recent Labs Lab 10/14/16 1222 10/15/16 0555 10/16/16 0429 10/17/16 0619  NA 138 137 138 138  K 4.0 3.7 3.7 3.8  CL 105 104 104 102  CO2 25 26 26 27   GLUCOSE 99 95 106* 106*  BUN 18 17 25* 30*  CREATININE 1.12 1.18 1.24 1.21  CALCIUM 8.7* 8.6* 8.7* 9.0  MG  --   --   --  2.0   GFR: Estimated Creatinine Clearance: 45.5 mL/min (by C-G formula based on SCr of 1.21 mg/dL). Liver Function Tests: No results for input(s): AST, ALT, ALKPHOS, BILITOT, PROT, ALBUMIN in the last 168 hours. No results for input(s): LIPASE, AMYLASE in the  last 168 hours. No results for input(s): AMMONIA in the last 168 hours. Coagulation Profile: No results for input(s): INR, PROTIME in the last 168 hours. Cardiac Enzymes:  Recent Labs Lab 10/14/16 1825 10/15/16 0007 10/15/16 0555  TROPONINI 0.03* 0.04* 0.04*   BNP (last 3 results) No results for input(s): PROBNP in the last 8760 hours. HbA1C: No results for input(s): HGBA1C in the last 72 hours. CBG: No results for input(s): GLUCAP in the last 168 hours. Lipid Profile: No results for input(s): CHOL, HDL, LDLCALC, TRIG, CHOLHDL, LDLDIRECT in the last 72 hours. Thyroid Function Tests: No results for input(s): TSH, T4TOTAL, FREET4, T3FREE, THYROIDAB in the last 72 hours. Anemia Panel: No results for input(s): VITAMINB12, FOLATE, FERRITIN, TIBC, IRON, RETICCTPCT in the last 72 hours. Sepsis Labs:  Recent Labs Lab 10/14/16 1825 10/16/16 0429  PROCALCITON <0.10 <0.10    Recent Results (from the past 240 hour(s))  MRSA PCR Screening     Status: None   Collection Time: 10/14/16  6:22 PM  Result Value Ref Range Status   MRSA by PCR NEGATIVE NEGATIVE Final    Comment:        The GeneXpert MRSA Assay (FDA approved for NASAL specimens only), is one component of a comprehensive MRSA colonization surveillance program. It is not intended to diagnose MRSA infection nor to guide or monitor treatment for MRSA infections.          Radiology Studies: No results found.      Scheduled Meds: . amiodarone  100 mg Oral Daily  . furosemide  20 mg Intravenous Daily  . rivaroxaban  20 mg Oral Q supper  . sodium chloride flush  3 mL Intravenous Q12H   Continuous Infusions: . sodium chloride 10 mL/hr (10/15/16 0122)     LOS: 3 days    Time spent: 35 minutes    Loretha Stapler, MD [facility] Pager [phone]  If 7PM-7AM, please contact night-coverage [email or web address] Password [email or web address] 10/17/2016, 2:32 PM

## 2016-10-18 ENCOUNTER — Other Ambulatory Visit: Payer: Self-pay

## 2016-10-18 DIAGNOSIS — R42 Dizziness and giddiness: Secondary | ICD-10-CM

## 2016-10-18 DIAGNOSIS — N183 Chronic kidney disease, stage 3 (moderate): Secondary | ICD-10-CM

## 2016-10-18 DIAGNOSIS — J9601 Acute respiratory failure with hypoxia: Secondary | ICD-10-CM

## 2016-10-18 DIAGNOSIS — I5043 Acute on chronic combined systolic (congestive) and diastolic (congestive) heart failure: Secondary | ICD-10-CM

## 2016-10-18 DIAGNOSIS — I429 Cardiomyopathy, unspecified: Secondary | ICD-10-CM

## 2016-10-18 DIAGNOSIS — K625 Hemorrhage of anus and rectum: Secondary | ICD-10-CM

## 2016-10-18 DIAGNOSIS — I4891 Unspecified atrial fibrillation: Secondary | ICD-10-CM

## 2016-10-18 DIAGNOSIS — Z7901 Long term (current) use of anticoagulants: Secondary | ICD-10-CM

## 2016-10-18 DIAGNOSIS — I25118 Atherosclerotic heart disease of native coronary artery with other forms of angina pectoris: Secondary | ICD-10-CM

## 2016-10-18 LAB — BASIC METABOLIC PANEL
Anion gap: 9 (ref 5–15)
BUN: 29 mg/dL — AB (ref 6–20)
CO2: 26 mmol/L (ref 22–32)
CREATININE: 1.26 mg/dL — AB (ref 0.61–1.24)
Calcium: 8.9 mg/dL (ref 8.9–10.3)
Chloride: 101 mmol/L (ref 101–111)
GFR calc Af Amer: 60 mL/min — ABNORMAL LOW (ref >60)
GFR calc non Af Amer: 51 mL/min — ABNORMAL LOW (ref >60)
Glucose, Bld: 112 mg/dL — ABNORMAL HIGH (ref 65–99)
POTASSIUM: 3.8 mmol/L (ref 3.5–5.1)
SODIUM: 136 mmol/L (ref 135–145)

## 2016-10-18 LAB — PROCALCITONIN

## 2016-10-18 MED ORDER — HYDROCORTISONE 1 % EX CREA: 1.0000 "application " | TOPICAL_CREAM | Freq: Three times a day (TID) | CUTANEOUS | Status: DC | PRN

## 2016-10-18 MED ORDER — SODIUM CHLORIDE 0.9 % IV SOLN: 250.0000 mL | INTRAVENOUS | Status: DC

## 2016-10-18 NOTE — Progress Notes (Addendum)
PROGRESS NOTE    Alejandro [name]  JHE:[id number] DOB: [birth year] DOA: 10/14/2016 PCP: Glenda Chroman, MD    Brief Narrative:  Alejandro [name] is a 83 y.o. male with medical history of CAD, systolic and diastolic CHF, PAF on Xarelto, presents with 2-3 with history of worsening dyspnea on exertion. The patient states that he has been intermittently getting some chest discomfort with exertion, but denies any shortness of breath or chest discomfort at rest. He denies any fevers, chills, nausea, vomiting, diarrhea, coughing, hemoptysis. He denies any worsening lower action and edema, orthopnea or PND. The patient went to see his primary care provider on the day of admission, he was referred to the emergency department for further evaluation secondary to hypoxia. The patient is a poor historian secondary to cognitive impairment. He states that he has been weighing himself at home, and states that his weight has been stable. The patient states that he recently stopped taking his Florinef approximately one week prior to this admission secondary to dyspnea. Notably, the patient has a loop recorder that was recently interrogated by his cardiologist in April 2018. It showed several episodes of atrial fibrillation.  In the emergency department, the patient was noted to be tachycardic with heart rate 110-120 and hypoxic with oxygen saturation 86% on room air. On 3 L nasal cannula, the patient was saturating 94-25 percent. The patient was given furosemide 20 mg IV 1. Chest x-ray showed bilateral interstitial thickening and suggestion of pulmonary vascular congestion. BNP and CBC were essentially unremarkable. D-dimer was 0.51. BNP was 794.0.  He was started in IV diuresis.  His heart rate was maintained in the 110s but his blood pressure was borderline soft.  Cardiology was then consulted for assistance in managing his atrial fibrillation in setting of hypotension.   Assessment & Plan:   Active Problems:   Atrial  fibrillation with RVR ([name])   Chronic anticoagulation   Acute on chronic combined systolic and diastolic CHF (congestive heart failure) ([name])   Paroxysmal A-fib ([name])   Acute respiratory failure with hypoxia ([name])   CKD (chronic kidney disease) stage 3, GFR 30-59 ml/min  PAF--now with RVR -CHADS-VASc = 4 -continue Xarelto -amiodarone to increased to 200mg   - cardiology consulted - likely a component of his orthostatic hypotension - cardioversion scheduled for tomorrow  Acute respiratory failure with hypoxia - V/Q scan low probability for PE - likely attributed to CHF  Acute on chronic systolic and diastolic CHF -daily weights (78.92> 78.3kg) - BNP of 794 at admission -accurate I/O's (net -3.37L since admission) -fluid restrict -Echo showing slightly decreased EF (likely from a. Fib) -transition to PO lasix in am - no rales on exam or signs of volume overload  CKD stage 3 -baseline creatinine 1.1-1.4 -monitor with diuresis -qam BMP - creatinine of 1.26 this am   DVT prophylaxis: [place] Lovenox Code Status: Full Code Family Communication: wife and grand daughter are bedside Disposition Plan: Likely discharge back to previous environment when improved   Consultants:   None  Procedures:   Echocardiogram  V/Q scan  Antimicrobials:   None    Subjective: Patient significantly orthostatic today when working with PT.  Discussed plan with cardiology- will attempt cardioversion to convert patient.  Patient reports that he still feels very short of breath with minimal ambulation.    Objective: Vitals:   10/18/16 0027 10/18/16 0524 10/18/16 0700 10/18/16 0859  BP: 90/60 (!) 88/64 95/72 106/64  Pulse: 75 66 77 (!) 108  Resp:  20 20    Temp: 98 F (36.7 C) 98.6 F (37 C)    TempSrc: Oral Oral    SpO2: 95% 95%  93%  Weight:  78.3 kg (172 lb 11.2 oz)    Height:        Intake/Output Summary (Last 24 hours) at 10/18/16 1430 Last data filed at 10/18/16 1115  Gross  per 24 hour  Intake              240 ml  Output              700 ml  Net             -460 ml   Filed Weights   10/16/16 1813 10/17/16 0500 10/18/16 0524  Weight: 79.3 kg (174 lb 12.8 oz) 78.9 kg (174 lb) 78.3 kg (172 lb 11.2 oz)    Examination:  General exam:Appears calm and comfortable  Respiratory system:  no increase work of breathing, no rhonchi, no accessory muscle use, no rales Cardiovascular system:S1 &S2 heard, irregularly irregular and tachycardic. No JVD, murmurs, rubs, gallops or clicks. No pedal edema. Gastrointestinal system:Abdomen is nondistended, soft and nontender. No organomegaly or masses felt. Normal bowel sounds heard. Central nervous system:Alert and oriented to self, place, situation. No focal neurological deficits. Extremities: Symmetric 4x 5 power. Skin: No rashes, lesions or ulcers Psychiatry:Mood&affect appropriate    Data Reviewed: I have personally reviewed following labs and imaging studies  CBC:  Recent Labs Lab 10/14/16 1222 10/15/16 0555 10/17/16 0619  WBC 10.4 8.1 11.8*  NEUTROABS  --   --  7.5  HGB 12.9* 12.2* 13.7  HCT 39.5 36.7* 42.1  MCV 98.0 97.1 99.1  PLT 330 312 469   Basic Metabolic Panel:  Recent Labs Lab 10/14/16 1222 10/15/16 0555 10/16/16 0429 10/17/16 0619 10/18/16 0435  NA 138 137 138 138 136  K 4.0 3.7 3.7 3.8 3.8  CL 105 104 104 102 101  CO2 25 26 26 27 26   GLUCOSE 99 95 106* 106* 112*  BUN 18 17 25* 30* 29*  CREATININE 1.12 1.18 1.24 1.21 1.26*  CALCIUM 8.7* 8.6* 8.7* 9.0 8.9  MG  --   --   --  2.0  --    GFR: Estimated Creatinine Clearance: 43.7 mL/min (A) (by C-G formula based on SCr of 1.26 mg/dL (H)). Liver Function Tests: No results for input(s): AST, ALT, ALKPHOS, BILITOT, PROT, ALBUMIN in the last 168 hours. No results for input(s): LIPASE, AMYLASE in the last 168 hours. No results for input(s): AMMONIA in the last 168 hours. Coagulation Profile: No results for input(s): INR, PROTIME in  the last 168 hours. Cardiac Enzymes:  Recent Labs Lab 10/14/16 1825 10/15/16 0007 10/15/16 0555  TROPONINI 0.03* 0.04* 0.04*   BNP (last 3 results) No results for input(s): PROBNP in the last 8760 hours. HbA1C: No results for input(s): HGBA1C in the last 72 hours. CBG: No results for input(s): GLUCAP in the last 168 hours. Lipid Profile: No results for input(s): CHOL, HDL, LDLCALC, TRIG, CHOLHDL, LDLDIRECT in the last 72 hours. Thyroid Function Tests: No results for input(s): TSH, T4TOTAL, FREET4, T3FREE, THYROIDAB in the last 72 hours. Anemia Panel: No results for input(s): VITAMINB12, FOLATE, FERRITIN, TIBC, IRON, RETICCTPCT in the last 72 hours. Sepsis Labs:  Recent Labs Lab 10/14/16 1825 10/16/16 0429 10/18/16 0435  PROCALCITON <0.10 <0.10 <0.10    Recent Results (from the past 240 hour(s))  MRSA PCR Screening     Status: None  Collection Time: 10/14/16  6:22 PM  Result Value Ref Range Status   MRSA by PCR NEGATIVE NEGATIVE Final    Comment:        The GeneXpert MRSA Assay (FDA approved for NASAL specimens only), is one component of a comprehensive MRSA colonization surveillance program. It is not intended to diagnose MRSA infection nor to guide or monitor treatment for MRSA infections.          Radiology Studies: No results found.      Scheduled Meds: . amiodarone  100 mg Oral Daily  . furosemide  20 mg Intravenous Daily  . rivaroxaban  20 mg Oral Q supper  . sodium chloride flush  3 mL Intravenous Q12H   Continuous Infusions: . sodium chloride 10 mL/hr (10/15/16 0122)  . sodium chloride       LOS: 4 days    Time spent: 35 minutes    Loretha Stapler, MD [facility] Pager [phone]  If 7PM-7AM, please contact night-coverage [email or web address] Password [email or web address] 10/18/2016, 2:30 PM

## 2016-10-18 NOTE — Progress Notes (Signed)
Patient BP still running soft at 88/64-automatic and 82/64-manual.  MD informed, instructed to hold BP medications.  Info to be passed on to day shift nurse.  Patient resting quietly in bed with no further complaints.

## 2016-10-18 NOTE — Consult Note (Signed)
Cardiology Consultation:   Patient ID: Alejandro [name]; [id number]; 12-Sep-1932   Admit date: 10/14/2016 Date of Consult: 10/18/2016  Primary Care Provider: Glenda Chroman, MD Primary Cardiologist: Dr Domenic Polite Primary Electrophysiologist:  Dr Caryl Comes   Patient Profile:   Alejandro [name] is a 83 y.o. male with a hx of PAF who is being seen today for the evaluation of dizziness at the request of Dr Adair Patter.  History of Present Illness:   Alejandro [name] is an 83 y/o male with a history of moderate CAD by cath in 2016, cardiomyopathy with variable EF readings, prior CVA, and PAF on Xarelto and low dose Amiodarone. About two months ago he complained of dizziness. He saw Dr Domenic Polite and was orthostatic. Florinef was added but that made him "dizzy". He apparently has a history of medication sensitivity and low B/P at baseline which has limited medical Rx. In reviewing his EKGs it looks like he was in NSR in April, now AF.   The pt presented 10/14/16 with dizziness, hypotension and CHF. His EKG and telemetry now show AF with RVR. We are asked to see for further evaluation. His echo in 2016 showed his EF to be 55%, now 40-45%.    Past Medical History:  Diagnosis Date  . CAD (coronary artery disease)    Moderate multivessel March 2016  . CHF (congestive heart failure) ([place])   . Essential hypertension   . Memory loss   . NSVT (nonsustained ventricular tachycardia) ([name])    Negative EP study  . Paroxysmal atrial fibrillation ([name])   . Secondary cardiomyopathy ([place])    LVEF 40-45% January 2014  . Syncope   . TIA (transient ischemic attack)     Past Surgical History:  Procedure Laterality Date  . CATARACT EXTRACTION    . COLONOSCOPY  unknown  . COLONOSCOPY N/A 09/05/2014   Procedure: COLONOSCOPY;  Surgeon: [name], MD;  Location: [facility];  Service: Endoscopy;  Laterality: N/A;  145pm  . ELECTROPHYSIOLOGY STUDY N/A 08/05/2014   Procedure: ELECTROPHYSIOLOGY STUDY;  Surgeon: Deboraha Sprang, MD;  Location: [facility] CATH LAB;  Service: Cardiovascular;  Laterality: N/A;  . LAPAROSCOPIC APPENDECTOMY  November 2015   Dr. [name]  . LEFT HEART CATHETERIZATION WITH CORONARY ANGIOGRAM N/A 07/31/2014   Procedure: LEFT HEART CATHETERIZATION WITH CORONARY ANGIOGRAM;  Surgeon: [name], MD;  Location: [facility] CATH LAB;  Service: Cardiovascular;  Laterality: N/A;  . LOOP RECORDER IMPLANT       Inpatient Medications: Scheduled Meds: . amiodarone  100 mg Oral Daily  . furosemide  20 mg Intravenous Daily  . rivaroxaban  20 mg Oral Q supper  . sodium chloride flush  3 mL Intravenous Q12H   Continuous Infusions: . sodium chloride 10 mL/hr (10/15/16 0122)   PRN Meds: sodium chloride, acetaminophen, ondansetron (ZOFRAN) IV, sodium chloride flush  Allergies:   No Known Allergies  Social History:   Social History   Social History  . Marital status: Married    Spouse name: N/A  . Number of children: N/A  . Years of education: 56   Occupational History  . Retired    Social History Main Topics  . Smoking status: Never Smoker  . Smokeless tobacco: Never Used  . Alcohol use No  . Drug use: No  . Sexual activity: Not on file   Other Topics Concern  . Not on file   Social History Narrative   Caffeine: 2 drinks a day  Family History:   The patient's family history includes Alzheimer's disease in his mother; Arthritis in his father; Heart disease in his father.  ROS:  Please see the history of present illness.  All other ROS reviewed and negative.    He is not SOB flat in bed  Physical Exam/Data:   Vitals:   10/18/16 0027 10/18/16 0524 10/18/16 0700 10/18/16 0859  BP: 90/60 (!) 88/64 95/72 106/64  Pulse: 75 66 77 (!) 108  Resp: 20 20    Temp: 98 F (36.7 C) 98.6 F (37 C)    TempSrc: Oral Oral    SpO2: 95% 95%  93%  Weight:  172 lb 11.2 oz (78.3 kg)    Height:        Intake/Output Summary (Last 24 hours) at 10/18/16 1006 Last data filed at 10/17/16 1900   Gross per 24 hour  Intake              240 ml  Output              700 ml  Net             -460 ml   Filed Weights   10/16/16 1813 10/17/16 0500 10/18/16 0524  Weight: 174 lb 12.8 oz (79.3 kg) 174 lb (78.9 kg) 172 lb 11.2 oz (78.3 kg)   Body mass index is 26.26 kg/m.  General:  Well nourished, well developed, in no acute distress HEENT: normal Lymph: no adenopathy Neck: no JVD Endocrine:  No thryomegaly Vascular: No carotid bruits; FA pulses 2+ bilaterally without bruits  Cardiac:  irreg irreg, no murmur Lungs:  clear to auscultation bilaterally, no wheezing, rhonchi or rales  Abd: soft, nontender, no hepatomegaly  Ext: no edema Musculoskeletal:  No deformities, BUE and BLE strength normal and equal Skin: warm and dry  Neuro:  CNs 2-12 intact, no focal abnormalities noted Psych:  Normal affect    EKG:  The EKG 10/15/16 was personally reviewed and demonstrates AF with incomplete LBBB and QTc 536  Relevant CV Studies: Echo 10/15/16 Impressions:  - Mild LVH with LVEF approximately 40-45% in the setting of atrial   fibrillation. There is diffuse hypokinesis, most prominent in the   inferolateral wall. Indeterminate diastolic function. Upper   normal left atrial chamber size. Mild mitral regurgitation.   Mildly sclerotic aortic valve with trivial aortic regurgitation.   Mildly dilated right atrium. Trivial tricuspid regurgitation with   PASP estimated 31 mmHg.  Laboratory Data:  Chemistry Recent Labs Lab 10/16/16 0429 10/17/16 0619 10/18/16 0435  NA 138 138 136  K 3.7 3.8 3.8  CL 104 102 101  CO2 26 27 26   GLUCOSE 106* 106* 112*  BUN 25* 30* 29*  CREATININE 1.24 1.21 1.26*  CALCIUM 8.7* 9.0 8.9  GFRNONAA 52* 54* 51*  GFRAA >60 >60 60*  ANIONGAP 8 9 9     No results for input(s): PROT, ALBUMIN, AST, ALT, ALKPHOS, BILITOT in the last 168 hours. Hematology Recent Labs Lab 10/14/16 1222 10/15/16 0555 10/17/16 0619  WBC 10.4 8.1 11.8*  RBC 4.03* 3.78* 4.25    HGB 12.9* 12.2* 13.7  HCT 39.5 36.7* 42.1  MCV 98.0 97.1 99.1  MCH 32.0 32.3 32.2  MCHC 32.7 33.2 32.5  RDW 13.7 13.8 13.8  PLT 330 312 383   Cardiac Enzymes Recent Labs Lab 10/14/16 1825 10/15/16 0007 10/15/16 0555  TROPONINI 0.03* 0.04* 0.04*    Recent Labs Lab 10/14/16 1239  TROPIPOC 0.01    BNP  Recent Labs Lab 10/14/16 1224  BNP 794.0*    DDimer  Recent Labs Lab 10/14/16 1222  DDIMER 0.51*    Radiology/Studies:  Dg Chest 2 View  Result Date: 10/14/2016 CLINICAL DATA:  Shortness of breath on exertion for 2 weeks EXAM: CHEST  2 VIEW COMPARISON:  08/27/2016 FINDINGS: There is diffuse bilateral interstitial thickening. There is no focal parenchymal opacity. There is no pleural effusion or pneumothorax. There is stable cardiomegaly. The osseous structures are unremarkable. IMPRESSION: Cardiomegaly with mild pulmonary vascular congestion. Electronically Signed   By: Kathreen Devoid   On: 10/14/2016 12:50   Nm Pulmonary Perf And Vent  Result Date: 10/15/2016 CLINICAL DATA:  Shortness of breath.  Dizziness. EXAM: NUCLEAR MEDICINE VENTILATION - PERFUSION LUNG SCAN TECHNIQUE: Ventilation images were obtained in multiple projections using inhaled aerosol Tc-84m DTPA. Perfusion images were obtained in multiple projections after intravenous injection of Tc-48m MAA. RADIOPHARMACEUTICALS:  30 mCi Technetium-37m DTPA aerosol inhalation and 4.1 mCi Technetium-69m MAA IV COMPARISON:  Chest x-ray 10/14/2016 FINDINGS: No significant ventilation perfusion mismatches are noted. Poor ventilation in the upper lobes, perfusion is better. Findings consist with low probability pulmonary embolus . IMPRESSION: Low probability pulmonary embolus. Poor ventilation in the upper lobes, this may be secondary to congestive heart failure. Electronically Signed   By: [place]   On: 10/15/2016 10:20    Assessment and Plan:   Orthostatic hypotension This is new in the last several weeks. I think  its from recurrent atrial fibrilation  PAF NSR in April, now AF with symptoms and drop in EF.   CAD Moderate CAD at cath March 2016. 50% Dx1 and Dx2, 60-70% osteal CFX, 60% RI, and sluggish flow in RCA. EF was 35% then.   Cardiomyopathy Suspect this if from AF  Chronic anticoagulation CHADs VASc = 6. On Xarelto  H/O CVA Residual cognitive impairment  Plan; Will discuss with MD- ? EP consult.  ? Increase Amiodarone to 200 mg daily and cardiovert. Not sure this is an option with prolonged QTc and pt's history of medication sensitivity.   Signed, Kerin Ransom, PA-C  10/18/2016 10:06 AM   The patient was seen and examined, and I agree with the history, physical exam, assessment and plan as documented above, with modifications as noted below. I have also personally reviewed all relevant documentation, old records, labs, and both radiographic and cardiovascular studies. I have also independently interpreted old and new ECG's.  83 yr old patient of Dr. Domenic Polite with recurrent lightheadedness and dizziness, PAF, CAD, orthostatic hypotension, and CVA admitted with rapid atrial fibrillation and acute on chronic combined systolic and diastolic heart failure, LVEF now 40-45%.  He did not tolerate Florinef. He did not use the compression stockings either.  He denies chest pain, palpitations and shortness of breath at rest, but experiences dizziness when walking in the hospital room.  He ate breakfast at 8 AM. He has been taking Xarelto daily.  He has been on low dose amiodarone 100 mg daily as he has not tolerated several medications including beta blockers.  I suspect his cardiomyopathy is due to atrial fibrillation.  His symptoms are also likely due to this at present.  He has been diuresed adequately.  Left atrium at upper normal limits in size.  I will arrange for direct current cardioversion as this will likely be successful given left atrial size. I will see if this can be done  later this afternoon. If not, will plan for tomorrow morning.  I will then  plan to increase amiodarone to 200 mg daily and arrange for follow-up with electrophysiology.    Kate Sable, MD, [facility]  10/18/2016 11:39 AM

## 2016-10-18 NOTE — Progress Notes (Signed)
Unable to do cardioversion today- scheduled for 11 am tomorrow.   Kerin Ransom PA-C 10/18/2016 12:18 PM

## 2016-10-18 NOTE — Evaluation (Signed)
Physical Therapy Evaluation Patient Details Name: Alejandro [name] MRN: [id number] DOB: 16-Sep-1932 Today's Date: 10/18/2016   History of Present Illness  83 y.o. male with medical history of CAD, systolic and diastolic CHF, PAF on Xarelto, presents with 2-3 with history of worsening dyspnea on exertion. The patient states that he has been intermittently getting some chest discomfort with exertion, but denies any shortness of breath or chest discomfort at rest. He denies any fevers, chills, nausea, vomiting, diarrhea, coughing, hemoptysis. He denies any worsening lower action and edema, orthopnea or PND. The patient went to see his primary care provider on the day of admission, he was referred to the emergency department for further evaluation secondary to hypoxia. The patient is a poor historian secondary to cognitive impairment. He states that he has been weighing himself at home, and states that his weight has been stable. The patient states that he recently stopped taking his Florinef approximately one week prior to this admission secondary to dyspnea. Notably, the patient has a loop recorder that was recently interrogated by his cardiologist in April 2018. It showed several episodes of atrial fibrillation.  Dx:  Acute respiratory failure with hypoxia, Acute on chronic systolic and diastolic CHF.  Clinical Impression  Pt received in bed, and was agreeable to PT evaluation.  Pt expressed that he is normally independent with ambulation, ADL's, and IADL's.  He recently gave up driving about 3-4 weeks ago per MD recommendation.  During today's PT evaluation he demonstrates bed mobility independent with supine<>sit, and also transfers sit<>stand at Mod (I) level, however further mobility is limited due to results of orthostatic BP's as follows:   Supine: 97/67, HR: 58bpm (unsure if this measurement is accurate per dynamap) Sitting: 104/65, HR: 122bpm (per tele box) with pt c/o dizziness Standing: 73/48, HR:  132bpm (per tele box) with significant c/o dizziness and HA, and pt expresses feeling that he was going to pass out.  Pt returned to supine quickly. Supine: 106/64, HR: 108bpm (per tele box) with improvement of symptoms.   Although pt is generally weaker than his normal, however unable to assess his dynamic standing balance due to limitations with orthostatic hypotension.  Will continue to follow pt in the acute care setting, however, his discharge disposition is still yet TBD - based on further mobility testing.      Follow Up Recommendations Other (comment);Supervision/Assistance - 24 hour (TBD)    Equipment Recommendations  Other (comment) (TBD)    Recommendations for Other Services       Precautions / Restrictions Precautions Precautions: Fall Restrictions Weight Bearing Restrictions: No      Mobility  Bed Mobility Overal bed mobility: Modified Independent                Transfers Overall transfer level: Modified independent Equipment used: None             General transfer comment: Pt with extensive dizziness associated with orthostatic hypotension and tachycardia with BP dropping as low as 73/48, and HR up to 132bpm.  Dr. Adair Patter aware.   Ambulation/Gait Ambulation/Gait assistance:  (NA - due to orthostatic hypotension and tachycardia. )              Stairs            Wheelchair Mobility    Modified Rankin (Stroke Patients Only)       Balance   Sitting-balance support: Feet supported;No upper extremity supported Sitting balance-Leahy Scale: Good     Standing balance support:  No upper extremity supported Standing balance-Leahy Scale: Good                               Pertinent Vitals/Pain Pain Assessment: No/denies pain    Home Living   Living Arrangements: Spouse/significant other Available Help at Discharge: Family Type of Home: House Home Access: Stairs to enter   [employer] of Steps: Front - 4  steps and then another 6 steps with HR.  Home Layout: Two level Home Equipment: Cane - single point;Walker - 2 wheels      Prior Function Level of Independence: Independent         Comments: Quit driving 3-4 weeks prior to admission due to doctors recommendations.      Hand Dominance        Extremity/Trunk Assessment   Upper Extremity Assessment Upper Extremity Assessment: Generalized weakness    Lower Extremity Assessment Lower Extremity Assessment: Generalized weakness       Communication   Communication: No difficulties  Cognition Arousal/Alertness: Awake/alert Behavior During Therapy: WFL for tasks assessed/performed Overall Cognitive Status: Within Functional Limits for tasks assessed                                        General Comments      Exercises     Assessment/Plan    PT Assessment Patient needs continued PT services (At least while he is in the acute care setting, however discharge disposition is yet TBD)  PT Problem List Decreased activity tolerance;Decreased balance;Decreased mobility;Cardiopulmonary status limiting activity       PT Treatment Interventions Gait training;Functional mobility training;Therapeutic activities;Therapeutic exercise;Balance training;Patient/family education;DME instruction    PT Goals (Current goals can be found in the Care Plan section)  Acute Rehab PT Goals Patient Stated Goal: To go home.  PT Goal Formulation: With patient Time For Goal Achievement: 11/01/16 Potential to Achieve Goals: Fair    Frequency Min 3X/week   Barriers to discharge        Co-evaluation               AM-PAC PT "6 Clicks" Daily Activity  Outcome Measure Difficulty turning over in bed (including adjusting bedclothes, sheets and blankets)?: None Difficulty moving from lying on back to sitting on the side of the bed? : None Difficulty sitting down on and standing up from a chair with arms (e.g., wheelchair,  bedside commode, etc,.)?: None Help needed moving to and from a bed to chair (including a wheelchair)?: A Little Help needed walking in hospital room?: A Lot Help needed climbing 3-5 steps with a railing? : A Lot 6 Click Score: 19    End of Session Equipment Utilized During Treatment: Gait belt;Oxygen Activity Tolerance: Treatment limited secondary to medical complications (Comment) (orthostatic hypotension and tachycardia) Patient left: in bed;with call bell/phone within reach Nurse Communication: Mobility status PT Visit Diagnosis: Dizziness and giddiness (R42);Muscle weakness (generalized) (M62.81);Other abnormalities of gait and mobility (R26.89);Unsteadiness on feet (R26.81)    Time: 8101-7510 PT Time Calculation (min) (ACUTE ONLY): 25 min   Charges:   PT Evaluation $PT Eval Low Complexity: 1 Procedure PT Treatments $Therapeutic Activity: 8-22 mins   PT G Codes:   PT G-Codes **NOT FOR INPATIENT CLASS** Functional Assessment Tool Used: AM-PAC 6 Clicks Basic Mobility;Clinical judgement Functional Limitation: Mobility: Walking and moving around Mobility: Walking and Moving  Around Current Status (V6945): At least 20 percent but less than 40 percent impaired, limited or restricted Mobility: Walking and Moving Around Goal Status [phone]): At least 1 percent but less than 20 percent impaired, limited or restricted    Beth Hansen Carino, PT, DPT X: [phone]

## 2016-10-19 ENCOUNTER — Encounter (HOSPITAL_COMMUNITY)
Admission: EM | Disposition: A | Discharge status: Home / Self Care | Payer: Self-pay | Source: Home / Self Care | Attending: Family Medicine

## 2016-10-19 ENCOUNTER — Inpatient Hospital Stay (HOSPITAL_COMMUNITY): Payer: Medicare Other | Admitting: Anesthesiology

## 2016-10-19 ENCOUNTER — Encounter (HOSPITAL_COMMUNITY): Payer: Self-pay | Admitting: *Deleted

## 2016-10-19 ENCOUNTER — Other Ambulatory Visit: Payer: Medicare Other

## 2016-10-19 HISTORY — PX: CARDIOVERSION: SHX1299

## 2016-10-19 LAB — BASIC METABOLIC PANEL
ANION GAP: 9 (ref 5–15)
BUN: 34 mg/dL — ABNORMAL HIGH (ref 6–20)
CALCIUM: 9.1 mg/dL (ref 8.9–10.3)
CO2: 26 mmol/L (ref 22–32)
Chloride: 101 mmol/L (ref 101–111)
Creatinine, Ser: 1.31 mg/dL — ABNORMAL HIGH (ref 0.61–1.24)
GFR, EST AFRICAN AMERICAN: 57 mL/min — AB (ref >60)
GFR, EST NON AFRICAN AMERICAN: 49 mL/min — AB (ref >60)
GLUCOSE: 106 mg/dL — AB (ref 65–99)
POTASSIUM: 4 mmol/L (ref 3.5–5.1)
Sodium: 136 mmol/L (ref 135–145)

## 2016-10-19 LAB — T4, FREE: FREE T4: 2.23 ng/dL — AB (ref 0.61–1.12)

## 2016-10-19 LAB — TSH: TSH: <0.01 u[IU]/mL — ABNORMAL LOW (ref 0.350–4.500)

## 2016-10-19 SURGERY — CARDIOVERSION
Anesthesia: Monitor Anesthesia Care

## 2016-10-19 MED ORDER — MIDAZOLAM HCL 2 MG/2ML IJ SOLN
INTRAMUSCULAR | Status: AC
  Filled 2016-10-19: qty 2

## 2016-10-19 MED ORDER — PROPOFOL 10 MG/ML IV BOLUS
INTRAVENOUS | Status: AC
  Filled 2016-10-19: qty 20

## 2016-10-19 MED ORDER — AMIODARONE HCL 200 MG PO TABS
200.0000 mg | ORAL_TABLET | Freq: Every day | ORAL | Status: DC
  Administered 2016-10-20 – 2016-10-21 (×2): 200 mg via ORAL
  Filled 2016-10-19 (×3): qty 1

## 2016-10-19 MED ORDER — LACTATED RINGERS IV SOLN
INTRAVENOUS | Status: DC
  Administered 2016-10-19 (×2): via INTRAVENOUS

## 2016-10-19 MED ORDER — PROPOFOL 500 MG/50ML IV EMUL
INTRAVENOUS | Status: DC | PRN
  Administered 2016-10-19: 125 ug/kg/min via INTRAVENOUS

## 2016-10-19 MED ORDER — AMIODARONE HCL 200 MG PO TABS: 200.0000 mg | ORAL_TABLET | Freq: Every day | ORAL | 1 refills | Status: DC

## 2016-10-19 MED ORDER — AMIODARONE HCL 200 MG PO TABS
100.0000 mg | ORAL_TABLET | Freq: Once | ORAL | Status: AC
  Administered 2016-10-19: 100 mg via ORAL
  Filled 2016-10-19: qty 1

## 2016-10-19 NOTE — Anesthesia Postprocedure Evaluation (Signed)
Anesthesia Post Note  Patient: Alejandro [name]  Procedure(s) Performed: Procedure(s) (LRB): CARDIOVERSION (N/A)  Patient location during evaluation: PACU Anesthesia Type: MAC Level of consciousness: awake and alert Pain management: pain level controlled Vital Signs Assessment: post-procedure vital signs reviewed and stable Respiratory status: spontaneous breathing Cardiovascular status: blood pressure returned to baseline Postop Assessment: no signs of nausea or vomiting Anesthetic complications: no     Last Vitals:  Vitals:   10/19/16 1315 10/19/16 1322  BP:  (!) 108/53  Pulse: 67 71  Resp: 20 20  Temp:  36.6 C    Last Pain:  Vitals:   10/19/16 1322  TempSrc: Oral  PainSc:                  Tahira Olivarez

## 2016-10-19 NOTE — Anesthesia Preprocedure Evaluation (Signed)
Anesthesia Evaluation  Patient identified by MRN, date of birth, ID band Patient awake    Reviewed: Allergy & Precautions, NPO status , Patient's Chart, lab work & pertinent test results  Airway Mallampati: I  TM Distance: >3 FB     Dental  (+) Teeth Intact, Poor Dentition   Pulmonary    breath sounds clear to auscultation       Cardiovascular hypertension, Pt. on medications + CAD and +CHF  + dysrhythmias Atrial Fibrillation and Ventricular Tachycardia  Rhythm:Irregular Rate:Tachycardia     Neuro/Psych TIA   GI/Hepatic negative GI ROS,   Endo/Other    Renal/GU Renal disease     Musculoskeletal   Abdominal   Peds  Hematology   Anesthesia Other Findings   Reproductive/Obstetrics                             Anesthesia Physical Anesthesia Plan  ASA: III  Anesthesia Plan: MAC   Post-op Pain Management:    Induction: Intravenous  Airway Management Planned: Simple Face Mask  Additional Equipment:   Intra-op Plan:   Post-operative Plan:   Informed Consent: I have reviewed the patients History and Physical, chart, labs and discussed the procedure including the risks, benefits and alternatives for the proposed anesthesia with the patient or authorized representative who has indicated his/her understanding and acceptance.     Plan Discussed with:   Anesthesia Plan Comments:         Anesthesia Quick Evaluation

## 2016-10-19 NOTE — Transfer of Care (Signed)
Immediate Anesthesia Transfer of Care Note  Patient: Alejandro [name]  Procedure(s) Performed: Procedure(s): CARDIOVERSION (N/A)  Patient Location: PACU  Anesthesia Type:MAC  Level of Consciousness: awake  Airway & Oxygen Therapy: Patient Spontanous Breathing and Patient connected to face mask oxygen  Post-op Assessment: Report given to RN  Post vital signs: Reviewed  Last Vitals:  Vitals:   10/19/16 1140 10/19/16 1145  BP: 113/86 114/70  Pulse:    Resp: 19 17  Temp:      Last Pain:  Vitals:   10/19/16 1057  TempSrc: Oral  PainSc:       Patients Stated Pain Goal: 6 (24/09/73 5329)  Complications: No apparent anesthesia complications

## 2016-10-19 NOTE — Procedures (Signed)
Elective direct current cardioversion  Indication: Persistent atrial fibrillation  Description of procedure: After informed consent was obtained and preprocedure evaluation by Anesthesia, patient was taken to the procedure room. Timeout performed. Sedation was managed completely by the Anesthesia service, please refer to their documentation for details. Anterior and posterior chest pads were placed and connected to a biphasic defibrillator. A single synchronized shock of 120J was delivered resulting in restoration of sinus rhythm with PAC's and PVC's. The patient remained hemodynamically stable throughout, and there were no immediate complications noted. Follow-up ECG obtained.  Kate Sable, M.D., F.A.C.C.

## 2016-10-19 NOTE — Progress Notes (Signed)
Progress Note  Patient Name: Alejandro [name] Date of Encounter: 10/19/2016  Primary Cardiologist: Domenic Polite  Subjective   SOB much improved. Able to lie flat. Generally unaware of his atrial fib but rate still running in 100-110 range. BP on softer side.   Inpatient Medications    Scheduled Meds: . amiodarone  100 mg Oral Daily  . furosemide  20 mg Intravenous Daily  . rivaroxaban  20 mg Oral Q supper  . sodium chloride flush  3 mL Intravenous Q12H   Continuous Infusions: . sodium chloride 10 mL/hr (10/15/16 0122)  . sodium chloride     PRN Meds: sodium chloride, acetaminophen, hydrocortisone cream, ondansetron (ZOFRAN) IV, sodium chloride flush   Vital Signs    Vitals:   10/18/16 1633 10/18/16 2026 10/18/16 2205 10/19/16 0524  BP: (!) 90/58  94/60 (!) 95/59  Pulse: 75 (!) 52 67 62  Resp: 20 18 20 20   Temp: 98.4 F (36.9 C)  98 F (36.7 C) 98.4 F (36.9 C)  TempSrc: Oral  Oral Oral  SpO2: 95% 93% 94% 95%  Weight:    169 lb (76.7 kg)  Height:        Intake/Output Summary (Last 24 hours) at 10/19/16 0805 Last data filed at 10/19/16 0525  Gross per 24 hour  Intake              780 ml  Output             1650 ml  Net             -870 ml   Filed Weights   10/17/16 0500 10/18/16 0524 10/19/16 0524  Weight: 174 lb (78.9 kg) 172 lb 11.2 oz (78.3 kg) 169 lb (76.7 kg)    Telemetry    Atrial fib rates 100-110 at present, occ PVCs  Physical Exam   GEN: No acute distress.  HEENT: Normocephalic, atraumatic, sclera non-icteric. Neck: No JVD or bruits. Cardiac: Irregularly irregular, rate mildly elevated, no murmurs, rubs, or gallops.  Radials/DP/PT 1+ and equal bilaterally.  Respiratory: Clear to auscultation bilaterally. Breathing is unlabored. GI: Soft, nontender, non-distended, BS +x 4. MS: no deformity. Extremities: No clubbing or cyanosis. No edema. Distal pedal pulses are 2+ and equal bilaterally. Neuro:  AAOx3. Follows commands. Psych:  Responds to  questions appropriately with a normal affect.  Labs    Chemistry Recent Labs Lab 10/17/16 0619 10/18/16 0435 10/19/16 0425  NA 138 136 136  K 3.8 3.8 4.0  CL 102 101 101  CO2 27 26 26   GLUCOSE 106* 112* 106*  BUN 30* 29* 34*  CREATININE 1.21 1.26* 1.31*  CALCIUM 9.0 8.9 9.1  GFRNONAA 54* 51* 49*  GFRAA >60 60* 57*  ANIONGAP 9 9 9      Hematology Recent Labs Lab 10/14/16 1222 10/15/16 0555 10/17/16 0619  WBC 10.4 8.1 11.8*  RBC 4.03* 3.78* 4.25  HGB 12.9* 12.2* 13.7  HCT 39.5 36.7* 42.1  MCV 98.0 97.1 99.1  MCH 32.0 32.3 32.2  MCHC 32.7 33.2 32.5  RDW 13.7 13.8 13.8  PLT 330 312 383    Cardiac Enzymes Recent Labs Lab 10/14/16 1825 10/15/16 0007 10/15/16 0555  TROPONINI 0.03* 0.04* 0.04*    Recent Labs Lab 10/14/16 1239  TROPIPOC 0.01     BNP Recent Labs Lab 10/14/16 1224  BNP 794.0*     DDimer  Recent Labs Lab 10/14/16 1222  DDIMER 0.51*     Radiology    No results found.  Cardiac Studies   2D echo 10/15/16 ------------------------------------------------------------------- Study Conclusions - Left ventricle: The cavity size was normal. Wall thickness was   increased in a pattern of mild LVH. Systolic function was mildly   to moderately reduced. The estimated ejection fraction was in the   range of 40% to 45%. Diffuse hypokinesis. There is hypokinesis of   the basal-midinferolateral myocardium. - Aortic valve: Mildly calcified annulus. Trileaflet; mildly   calcified leaflets. There was trivial regurgitation. - Mitral valve: There was mild regurgitation. - Left atrium: The atrium was at the upper limits of normal in   size. - Right atrium: The atrium was mildly dilated. - Tricuspid valve: There was trivial regurgitation. - Pulmonary arteries: PA peak pressure: 31 mm Hg (S). - Pericardium, extracardiac: There was no pericardial effusion. Impressions: - Mild LVH with LVEF approximately 40-45% in the setting of atrial    fibrillation. There is diffuse hypokinesis, most prominent in the   inferolateral wall. Indeterminate diastolic function. Upper   normal left atrial chamber size. Mild mitral regurgitation.   Mildly sclerotic aortic valve with trivial aortic regurgitation.   Mildly dilated right atrium. Trivial tricuspid regurgitation with   PASP estimated 31 mmHg.  Patient Profile     83 y.o. male with moderate CAD by cath 2016, cardiomyopathy with variable EF readings, prior CVA, PAF on Xarelto and low dose Amiodarone, orthostatic hypotension (intolerant of Florinef due to dizziness/SOB, did not use stockings), medication sensitivity (has not tolerated BB or ARB), and probable CKD stage III per labs, cognitive impairment. Has ILR in place - phone notes 09/28/16 indicate increased AF burden overall. Admitted with dizziness, hypoxia, and symptoms of CHF; also found to have atrial fib RVR. D-dimer elevated but VQ neg for PE. 2D Echo 10/15/16: mild LVH, EF 40-45% diffuse HK of basal mid-inferolateral myocardium, mild MR, mild RAE, LA upper limits of normal, PASP 31.   Assessment & Plan    1. Persistent atrial fib with elevated rates - patient scheduled for DCCV today. Ultimately would benefit from EP f/u. Consider changing Xarelto to 15mg  q supper given CrCl 26ml/min at diuresed weight. Will also update thyroid function given chronic amio use. Of note, hand-calculated QTC was 437 so this is erroneously reported out by computer-generated value.  2. Dizziness - I suspect this is due to chronic hypotension, which might be exacerbated by arrhythmia - difficult to know. I am guessing he developed worsening SOB/dizziness on Florinef due to exacerbation of CHF.  3. Acute on chronic combined CHF/cardiomyopathy with variable EF over the years -appears euvolemic at 169lb today - this is lower than baseline weight. Will hold further Lasix. BP too low for ACEI/ARB.  4. CAD - SOB improved with diuresis so will continue to follow  for now. Not on ASA due to concomitant Xarelto. Can consider statin as OP but would not start right now as not to muddle the clinical picture given his history of med intolerance.  5. Acute respiratory failure with hypoxia - symptoms improved. Will need trial off O2 per primary team.  Signed, Charlie Pitter, PA-C  10/19/2016, 8:05 AM    The patient was seen and examined, and I agree with the history, physical exam, assessment and plan as documented above. Pt has not been walking but denies palpitations and dizziness at rest. Says he has not missed a dose of Xarelto in several months. Will plan to proceed with DCCV.  Kate Sable, MD, [facility]  10/19/2016 9:12 AM

## 2016-10-19 NOTE — Care Management Note (Signed)
Case Management Note  Patient Details  Name: Alejandro [name] MRN: [id number] Date of Birth: 1932/10/25  If discussed at Long Length of Stay Meetings, dates discussed:  10/19/2016   Sherald Barge, RN 10/19/2016, 4:01 PM

## 2016-10-19 NOTE — Progress Notes (Signed)
Physical Therapy Treatment Patient Details Name: Alejandro [name] MRN: [id number] DOB: 02-Aug-1932 Today's Date: 10/19/2016    History of Present Illness 83 y.o. male with medical history of CAD, systolic and diastolic CHF, PAF on Xarelto, presents with 2-3 with history of worsening dyspnea on exertion. The patient states that he has been intermittently getting some chest discomfort with exertion, but denies any shortness of breath or chest discomfort at rest. He denies any fevers, chills, nausea, vomiting, diarrhea, coughing, hemoptysis. He denies any worsening lower action and edema, orthopnea or PND. The patient went to see his primary care provider on the day of admission, he was referred to the emergency department for further evaluation secondary to hypoxia. The patient is a poor historian secondary to cognitive impairment. He states that he has been weighing himself at home, and states that his weight has been stable. The patient states that he recently stopped taking his Florinef approximately one week prior to this admission secondary to dyspnea. Notably, the patient has a loop recorder that was recently interrogated by his cardiologist in April 2018. It showed several episodes of atrial fibrillation.  Dx:  Acute respiratory failure with hypoxia, Acute on chronic systolic and diastolic CHF.  S/P Cardioversion today and now in NSR.     PT Comments    Pt received sitting up on the EOB, and was agreeable to PT tx.  Wife present.   Orthostatic BP's taken:  Sitting BP: 113/55, HR: 76bpm Standing BP: 87/63, HR: 89bpm with extensive dizziness, and stating that he feels like he is going to pass out. Supine BP: 100/55, HR: 71bpm  Pt was then able to transfer from the bed<>recliner chair with min guard, and vitals after transfer were: 105/59, HR: 91bpm.  At this point, when he discharges, he is recommended for HHPT to ensure safety when he returns home due to high fall risk due.   Follow Up  Recommendations  Home health PT;Supervision/Assistance - 24 hour     Equipment Recommendations  Other (comment) (TBD)    Recommendations for Other Services       Precautions / Restrictions      Mobility  Bed Mobility Overal bed mobility: Modified Independent                Transfers Overall transfer level: Needs assistance Equipment used: None Transfers: Sit to/from Stand;Stand Pivot Transfers Sit to Stand: Min guard Stand pivot transfers: Min guard       General transfer comment: Due to extensive dizziness associated with orthostatic hypotension down to 87/63, HR: 89bpm when standing.  Pt returned to supine, and BP stabilized at 100/55, HR: 71.  He was then able to transfer from the bed<>chair and vitals after transfer were: 105/59, HR: 91bpm  Ambulation/Gait Ambulation/Gait assistance:  (NA due to orthostatic hypotension)               Stairs            Wheelchair Mobility    Modified Rankin (Stroke Patients Only)       Balance   Sitting-balance support: Feet supported;No upper extremity supported       Standing balance support: No upper extremity supported Standing balance-Leahy Scale: Fair                              Cognition Arousal/Alertness: Awake/alert Behavior During Therapy: WFL for tasks assessed/performed Overall Cognitive Status: Within Functional Limits for tasks assessed  Exercises      General Comments        Pertinent Vitals/Pain Pain Assessment: No/denies pain    Home Living                      Prior Function            PT Goals (current goals can now be found in the care plan section) Acute Rehab PT Goals Patient Stated Goal: To go home.  PT Goal Formulation: With patient Time For Goal Achievement: 11/01/16 Potential to Achieve Goals: Fair Progress towards PT goals: Progressing toward goals    Frequency    Min  3X/week      PT Plan Discharge plan needs to be updated    Co-evaluation              AM-PAC PT "6 Clicks" Daily Activity  Outcome Measure  Difficulty turning over in bed (including adjusting bedclothes, sheets and blankets)?: None Difficulty moving from lying on back to sitting on the side of the bed? : None Difficulty sitting down on and standing up from a chair with arms (e.g., wheelchair, bedside commode, etc,.)?: None Help needed moving to and from a bed to chair (including a wheelchair)?: A Little Help needed walking in hospital room?: A Lot Help needed climbing 3-5 steps with a railing? : A Lot 6 Click Score: 19    End of Session Equipment Utilized During Treatment: Gait belt Activity Tolerance: Treatment limited secondary to medical complications (Comment) (Pt continues to have orthostatic hypotension with associated dizziness.  ) Patient left: in chair;with call bell/phone within reach;with family/visitor present Nurse Communication: Mobility status Lilia Pro, RN notified of pt's mobiltiy status.  ) PT Visit Diagnosis: Dizziness and giddiness (R42);Muscle weakness (generalized) (M62.81);Other abnormalities of gait and mobility (R26.89);Unsteadiness on feet (R26.81)     Time: 2919-1660 PT Time Calculation (min) (ACUTE ONLY): 17 min  Charges:  $Therapeutic Activity: 8-22 mins                    G Codes:       Beth Vaunda Gutterman, PT, DPT X: [phone]

## 2016-10-19 NOTE — Progress Notes (Signed)
Electrical Cardioversion Procedure Note Alejandro [name] [id number] [birth year]  Procedure: Electrical Cardioversion Indications:  Persistent A-fib  Procedure Details Consent:yes Time Out: Verified patient identification, verified procedure, site/side was marked, verified correct patient position, special equipment/implants available, medications/allergies/relevent history reviewed, required imaging and test results available. Time out done at 11:57.  Patient placed on cardiac monitor, pulse oximetry, supplemental oxygen as necessary.  Sedation given: propofol Pacer pads placed front and back  Cardioverted 1 time  Cardioverted at 120 joules Evaluation Findings: Post procedure EKG shows: Normal sinus rhythm. Complications: none Patient did tolerate procedure well.   Hillery Jacks 10/19/2016, 1:10 PM

## 2016-10-19 NOTE — Progress Notes (Addendum)
Per review with Dr. Bronson Ing, pt in NSR with PACs and PVCs post DCCV - he recommends to increase amiodarone to 200mg  daily (will give 100mg  additional to the 100mg  he received this AM to equal 200mg  for today). Continue Xarelto at 20mg  qsupper dose given CrCl of 53 on admission but will need to be followed closely to determine if he requires dose reduction. Hold off on formal rx of Lasix now that he is in NSR and adequately diuresed with mild bump in Cr. F/u scheduled with Kerin Ransom PA-C 10/27/16 at 1pm.  Dayna Dunn PA-C

## 2016-10-19 NOTE — Progress Notes (Signed)
PROGRESS NOTE    Alejandro [name]  ZMO:[id number] DOB: August 20, 1932 DOA: 10/14/2016 PCP: Glenda Chroman, MD    Brief Narrative:  Alejandro [name] is a 83 y.o. male with medical history of CAD, systolic and diastolic CHF, PAF on Xarelto, presents with 2-3 with history of worsening dyspnea on exertion. The patient states that he has been intermittently getting some chest discomfort with exertion, but denies any shortness of breath or chest discomfort at rest. He denies any fevers, chills, nausea, vomiting, diarrhea, coughing, hemoptysis. He denies any worsening lower action and edema, orthopnea or PND. The patient went to see his primary care provider on the day of admission, he was referred to the emergency department for further evaluation secondary to hypoxia. The patient is a poor historian secondary to cognitive impairment. He states that he has been weighing himself at home, and states that his weight has been stable. The patient states that he recently stopped taking his Florinef approximately one week prior to this admission secondary to dyspnea. Notably, the patient has a loop recorder that was recently interrogated by his cardiologist in April 2018. It showed several episodes of atrial fibrillation.  In the emergency department, the patient was noted to be tachycardic with heart rate 110-120 and hypoxic with oxygen saturation 86% on room air. On 3 L nasal cannula, the patient was saturating 94-25 percent. The patient was given furosemide 20 mg IV 1. Chest x-ray showed bilateral interstitial thickening and suggestion of pulmonary vascular congestion. BNP and CBC were essentially unremarkable. D-dimer was 0.51. BNP was 794.0.  He was started in IV diuresis.  His heart rate was maintained in the 110s but his blood pressure was borderline soft.  Cardiology was then consulted for assistance in managing his atrial fibrillation in setting of hypotension.   Assessment & Plan:   Active Problems:   Atrial  fibrillation with RVR ([name])   Chronic anticoagulation   Acute on chronic combined systolic and diastolic CHF (congestive heart failure) ([name])   Paroxysmal A-fib ([name])   Acute respiratory failure with hypoxia ([name])   CKD (chronic kidney disease) stage 3, GFR 30-59 ml/min  PAF--now with RVR -CHADS-VASc = 4 -continue Xarelto -amiodarone to increased to 200mg   - cardiology consulted - patient cardioverted today  Dizziness and significant orthostatic hypotension - holding lasix - see how patient tolerates ambulation in am  Acute respiratory failure with hypoxia - V/Q scan low probability for PE - likely attributed to CHF  Acute on chronic systolic and diastolic CHF - no signs of volume overload on exam - lasix stopped - adequately diuresed   CKD stage 3 -baseline creatinine 1.1-1.4 -creatinine of 1.31 - repeat BMP in am   DVT prophylaxis: xarelto Code Status: Full Code Family Communication: wife and grand daughter are bedside Disposition Plan: Likely discharge back to previous environment when improved   Consultants:   None  Procedures:   Echocardiogram  V/Q scan  Antimicrobials:   None    Subjective: Patient anxious to get cardioversion.  Hopeful that will help with his dizziness and low blood pressures.    Objective: Vitals:   10/19/16 1311 10/19/16 1315 10/19/16 1322 10/19/16 1416  BP: 95/69  (!) 108/53 (!) 100/55  Pulse: 71 67 71 71  Resp: 17 20 20    Temp:   97.8 F (36.6 C)   TempSrc:   Oral   SpO2: 91% 92% 90%   Weight:      Height:  Intake/Output Summary (Last 24 hours) at 10/19/16 1632 Last data filed at 10/19/16 1217  Gross per 24 hour  Intake              840 ml  Output              750 ml  Net               90 ml   Filed Weights   10/18/16 0524 10/19/16 0524 10/19/16 1057  Weight: 78.3 kg (172 lb 11.2 oz) 76.7 kg (169 lb) 76.7 kg (169 lb)    Examination:  General exam:Appears calm and comfortable  Respiratory system:   no increase work of breathing, no rhonchi, no accessory muscle use, no rales Cardiovascular system:S1 &S2 heard, irregularly irregular and tachycardic. No JVD, murmurs, rubs, gallops or clicks. No pedal edema. Gastrointestinal system:Abdomen is nondistended, soft and nontender. No organomegaly or masses felt. Normal bowel sounds heard. Central nervous system:Alert and oriented to self, place, situation. No focal neurological deficits. Extremities: Symmetric 4x 5 power. Skin: No rashes, lesions or ulcers Psychiatry:Mood&affect appropriate    Data Reviewed: I have personally reviewed following labs and imaging studies  CBC:  Recent Labs Lab 10/14/16 1222 10/15/16 0555 10/17/16 0619  WBC 10.4 8.1 11.8*  NEUTROABS  --   --  7.5  HGB 12.9* 12.2* 13.7  HCT 39.5 36.7* 42.1  MCV 98.0 97.1 99.1  PLT 330 312 213   Basic Metabolic Panel:  Recent Labs Lab 10/15/16 0555 10/16/16 0429 10/17/16 0619 10/18/16 0435 10/19/16 0425  NA 137 138 138 136 136  K 3.7 3.7 3.8 3.8 4.0  CL 104 104 102 101 101  CO2 26 26 27 26 26   GLUCOSE 95 106* 106* 112* 106*  BUN 17 25* 30* 29* 34*  CREATININE 1.18 1.24 1.21 1.26* 1.31*  CALCIUM 8.6* 8.7* 9.0 8.9 9.1  MG  --   --  2.0  --   --    GFR: Estimated Creatinine Clearance: 42.1 mL/min (A) (by C-G formula based on SCr of 1.31 mg/dL (H)). Liver Function Tests: No results for input(s): AST, ALT, ALKPHOS, BILITOT, PROT, ALBUMIN in the last 168 hours. No results for input(s): LIPASE, AMYLASE in the last 168 hours. No results for input(s): AMMONIA in the last 168 hours. Coagulation Profile: No results for input(s): INR, PROTIME in the last 168 hours. Cardiac Enzymes:  Recent Labs Lab 10/14/16 1825 10/15/16 0007 10/15/16 0555  TROPONINI 0.03* 0.04* 0.04*   BNP (last 3 results) No results for input(s): PROBNP in the last 8760 hours. HbA1C: No results for input(s): HGBA1C in the last 72 hours. CBG: No results for input(s): GLUCAP in  the last 168 hours. Lipid Profile: No results for input(s): CHOL, HDL, LDLCALC, TRIG, CHOLHDL, LDLDIRECT in the last 72 hours. Thyroid Function Tests: No results for input(s): TSH, T4TOTAL, FREET4, T3FREE, THYROIDAB in the last 72 hours. Anemia Panel: No results for input(s): VITAMINB12, FOLATE, FERRITIN, TIBC, IRON, RETICCTPCT in the last 72 hours. Sepsis Labs:  Recent Labs Lab 10/14/16 1825 10/16/16 0429 10/18/16 0435  PROCALCITON <0.10 <0.10 <0.10    Recent Results (from the past 240 hour(s))  MRSA PCR Screening     Status: None   Collection Time: 10/14/16  6:22 PM  Result Value Ref Range Status   MRSA by PCR NEGATIVE NEGATIVE Final    Comment:        The GeneXpert MRSA Assay (FDA approved for NASAL specimens only), is one component of a  comprehensive MRSA colonization surveillance program. It is not intended to diagnose MRSA infection nor to guide or monitor treatment for MRSA infections.          Radiology Studies: No results found.      Scheduled Meds: . [START ON 10/20/2016] amiodarone  200 mg Oral Daily  . rivaroxaban  20 mg Oral Q supper  . sodium chloride flush  3 mL Intravenous Q12H   Continuous Infusions: . sodium chloride 10 mL/hr (10/15/16 0122)  . sodium chloride       LOS: 5 days    Time spent: 30 minutes    Loretha Stapler, MD [facility] Pager [phone]  If 7PM-7AM, please contact night-coverage [email or web address] Password [facility] 10/19/2016, 4:32 PM

## 2016-10-20 LAB — BASIC METABOLIC PANEL
ANION GAP: 11 (ref 5–15)
BUN: 30 mg/dL — ABNORMAL HIGH (ref 6–20)
CALCIUM: 8.9 mg/dL (ref 8.9–10.3)
CO2: 25 mmol/L (ref 22–32)
Chloride: 103 mmol/L (ref 101–111)
Creatinine, Ser: 1.17 mg/dL (ref 0.61–1.24)
GFR calc non Af Amer: 56 mL/min — ABNORMAL LOW (ref >60)
Glucose, Bld: 93 mg/dL (ref 65–99)
POTASSIUM: 4.2 mmol/L (ref 3.5–5.1)
Sodium: 139 mmol/L (ref 135–145)

## 2016-10-20 MED ORDER — MIDODRINE HCL 5 MG PO TABS
5.0000 mg | ORAL_TABLET | Freq: Three times a day (TID) | ORAL | Status: DC
  Administered 2016-10-20 – 2016-10-21 (×3): 5 mg via ORAL
  Filled 2016-10-20 (×3): qty 1

## 2016-10-20 MED ORDER — RIVAROXABAN 15 MG PO TABS
15.0000 mg | ORAL_TABLET | Freq: Every day | ORAL | Status: DC
  Administered 2016-10-20: 15 mg via ORAL
  Filled 2016-10-20: qty 1

## 2016-10-20 NOTE — Addendum Note (Signed)
Addendum  created 10/20/16 1522 by Mickel Baas, CRNA   Sign clinical note

## 2016-10-20 NOTE — Progress Notes (Signed)
Pt wanted to get up and try to walk. Performed Orthostatic vitals and BP continued to drop.  Pt stated he got dizzy so pt decided to sit down and rest, possibly try again later. MD notified. Will continue to monitor.

## 2016-10-20 NOTE — Anesthesia Postprocedure Evaluation (Signed)
Anesthesia Post Note  Patient: Alejandro [name]  Procedure(s) Performed: Procedure(s) (LRB): CARDIOVERSION (N/A)  Patient location during evaluation: Nursing Unit Anesthesia Type: MAC Level of consciousness: oriented, awake and alert and patient cooperative Pain management: pain level controlled Vital Signs Assessment: post-procedure vital signs reviewed and stable Respiratory status: spontaneous breathing and respiratory function stable Cardiovascular status: stable Postop Assessment: no signs of nausea or vomiting Anesthetic complications: no     Last Vitals:  Vitals:   10/19/16 2139 10/20/16 0624  BP: 109/68 102/72  Pulse: 66 67  Resp: 20 20  Temp: 36.8 C 37 C    Last Pain:  Vitals:   10/20/16 0624  TempSrc: Oral  PainSc:                  ADAMS, AMY A

## 2016-10-20 NOTE — Progress Notes (Signed)
PROGRESS NOTE  KAHIL AGNER SPQ:[id number] DOB: 1932/07/14 DOA: 10/14/2016 PCP: Glenda Chroman, MD  Brief History:  Alejandro Oyen Rhodesis a 83 y.o.malewith medical history of CAD, systolic and diastolic CHF, PAF on Xarelto,presents with 2-3 with history of worsening dyspnea on exertion. The patient states that he has been intermittently getting some chest discomfort with exertion, but denies any shortness of breath or chest discomfort at rest. He denies any fevers, chills, nausea, vomiting, diarrhea, coughing, hemoptysis. He denies any worsening lower action and edema, orthopnea or PND. The patient went to see his primary care provider on the day of admission, he was referred to the emergency department for further evaluation secondary to hypoxia. The patient is a poor historian secondary to cognitive impairment. He states that he has been weighing himself at home, and states that his weight has been stable. The patient states that he recently stopped taking his Florinef approximately one week prior to this admission secondary to dyspnea. Notably, the patient has a loop recorder that was recently interrogated by his cardiologist in April 2018. It showed several episodes of atrial fibrillation.  In the emergency department, the patient was noted to be tachycardic with heart rate 110-120 and hypoxic with oxygen saturation 86% on room air. On 3 L nasal cannula, the patient was saturating 94-25 percent. The patient was given furosemide 20 mg IV 1. Chest x-ray showed bilateral interstitial thickening and suggestion of pulmonary vascular congestion. BNP and CBC were essentially unremarkable. D-dimer was 0.51. BNP was 794.0.  He was started in IV diuresis.  His heart rate was maintained in the 110s but his blood pressure was borderline soft.  Cardiology was then consulted for assistance in managing his atrial fibrillation in setting of hypotension.  Assessment/Plan: PAF--now with RVR -CHADS-VASc =  4 -continue Xarelto -amiodarone to increased to 200mg   - cardiology consult appreciated - patient cardioverted 10/19/16  Dizziness and significant orthostatic hypotension -secondary to diuresis--NEG 6 lbs - holding lasix - start midodrine  Acute respiratory failure with hypoxia - V/Q scan low probability for PE - likely attributed to CHF -now stable on RA  Acute on chronic systolic and diastolic CHF - no signs of volume overload on exam - lasix stopped - adequately diuresed   CKD stage 3 -baseline creatinine 1.1-1.4 -creatinine of 1.31 - repeat BMP in am   Disposition Plan:   Home 5/24 if stable Family Communication:   Spouse update at bedside  Consultants:  cardiology  Code Status:  FULL   DVT Prophylaxis:  Xarelto   Procedures: As Listed in Progress Note Above  Antibiotics: None    Subjective: Patient complains of some dizziness with standing up. Otherwise he is feeling fine. He denies any shortness of breath, coughing, chest pain. Denies any fevers, chills, nausea, vomiting, diarrhea, abdominal pain, dysuria, hematuria.  Objective: Vitals:   10/20/16 0500 10/20/16 0624 10/20/16 1128 10/20/16 1425  BP:  102/72  109/68  Pulse:  67  74  Resp:  20  20  Temp:  98.6 F (37 C)  98.2 F (36.8 C)  TempSrc:  Oral    SpO2:  93% 94% 96%  Weight: 77 kg (169 lb 12.8 oz)     Height:        Intake/Output Summary (Last 24 hours) at 10/20/16 1820 Last data filed at 10/20/16 1700  Gross per 24 hour  Intake  780 ml  Output              950 ml  Net             -170 ml   Weight change: 0 kg (0 lb) Exam:   General:  Pt is alert, follows commands appropriately, not in acute distress  HEENT: No icterus, No thrush, No neck mass, [place]/AT  Cardiovascular: RRR, S1/S2, no rubs, no gallops  Respiratory: CTA bilaterally, no wheezing, no crackles, no rhonchi  Abdomen: Soft/+BS, non tender, non distended, no guarding  Extremities: No edema, No  lymphangitis, No petechiae, No rashes, no synovitis   Data Reviewed: I have personally reviewed following labs and imaging studies Basic Metabolic Panel:  Recent Labs Lab 10/16/16 0429 10/17/16 0619 10/18/16 0435 10/19/16 0425 10/20/16 0459  NA 138 138 136 136 139  K 3.7 3.8 3.8 4.0 4.2  CL 104 102 101 101 103  CO2 26 27 26 26 25   GLUCOSE 106* 106* 112* 106* 93  BUN 25* 30* 29* 34* 30*  CREATININE 1.24 1.21 1.26* 1.31* 1.17  CALCIUM 8.7* 9.0 8.9 9.1 8.9  MG  --  2.0  --   --   --    Liver Function Tests: No results for input(s): AST, ALT, ALKPHOS, BILITOT, PROT, ALBUMIN in the last 168 hours. No results for input(s): LIPASE, AMYLASE in the last 168 hours. No results for input(s): AMMONIA in the last 168 hours. Coagulation Profile: No results for input(s): INR, PROTIME in the last 168 hours. CBC:  Recent Labs Lab 10/14/16 1222 10/15/16 0555 10/17/16 0619  WBC 10.4 8.1 11.8*  NEUTROABS  --   --  7.5  HGB 12.9* 12.2* 13.7  HCT 39.5 36.7* 42.1  MCV 98.0 97.1 99.1  PLT 330 312 383   Cardiac Enzymes:  Recent Labs Lab 10/14/16 1825 10/15/16 0007 10/15/16 0555  TROPONINI 0.03* 0.04* 0.04*   BNP: Invalid input(s): POCBNP CBG: No results for input(s): GLUCAP in the last 168 hours. HbA1C: No results for input(s): HGBA1C in the last 72 hours. Urine analysis:    Component Value Date/Time   COLORURINE YELLOW 12/26/2014 [place] 12/26/2014 0352   LABSPEC 1.025 12/26/2014 0352   PHURINE 6.0 12/26/2014 0352   GLUCOSEU NEGATIVE 12/26/2014 0352   HGBUR NEGATIVE 12/26/2014 0352   BILIRUBINUR NEGATIVE 12/26/2014 0352   KETONESUR NEGATIVE 12/26/2014 0352   PROTEINUR NEGATIVE 12/26/2014 0352   UROBILINOGEN 0.2 12/26/2014 0352   NITRITE NEGATIVE 12/26/2014 0352   LEUKOCYTESUR SMALL (A) 12/26/2014 0352   Sepsis Labs: @LABRCNTIP (procalcitonin:4,lacticidven:4) ) Recent Results (from the past 240 hour(s))  MRSA PCR Screening     Status: None    Collection Time: 10/14/16  6:22 PM  Result Value Ref Range Status   MRSA by PCR NEGATIVE NEGATIVE Final    Comment:        The GeneXpert MRSA Assay (FDA approved for NASAL specimens only), is one component of a comprehensive MRSA colonization surveillance program. It is not intended to diagnose MRSA infection nor to guide or monitor treatment for MRSA infections.      Scheduled Meds: . amiodarone  200 mg Oral Daily  . midodrine  5 mg Oral TID WC  . rivaroxaban  15 mg Oral Q supper  . sodium chloride flush  3 mL Intravenous Q12H   Continuous Infusions: . sodium chloride 10 mL/hr (10/15/16 0122)  . sodium chloride      Procedures/Studies: Dg Chest 2 View  Result Date:  10/14/2016 CLINICAL DATA:  Shortness of breath on exertion for 2 weeks EXAM: CHEST  2 VIEW COMPARISON:  08/27/2016 FINDINGS: There is diffuse bilateral interstitial thickening. There is no focal parenchymal opacity. There is no pleural effusion or pneumothorax. There is stable cardiomegaly. The osseous structures are unremarkable. IMPRESSION: Cardiomegaly with mild pulmonary vascular congestion. Electronically Signed   By: Kathreen Devoid   On: 10/14/2016 12:50   Mr Brain Wo Contrast  Result Date: 10/11/2016 CLINICAL DATA:  Dizziness, gait instability and weakness for 2 months. History of stroke, hypertension, atrial fibrillation on chronic anti coagulation. EXAM: MRI HEAD WITHOUT CONTRAST TECHNIQUE: Multiplanar, multiecho pulse sequences of the brain and surrounding structures were obtained without intravenous contrast. COMPARISON:  CT HEAD August 27, 2016 and MRI of the head November 21, 2015 FINDINGS: BRAIN: No reduced diffusion to suggest acute ischemia. No susceptibility artifact to suggest hemorrhage. Moderate to severe ventriculomegaly on the basis of global parenchymal brain volume loss. Disproportionate midbrain and tectal volume loss with mild FLAIR T2 hyperintense signal. Old small LEFT occipital lobe and bilateral  mesial cerebellar infarcts. Patchy to confluent supratentorial white matter T2 hyperintensities. Tiny old LEFT frontal cortical infarcts. No midline shift, mass effect or masses. No abnormal extra-axial fluid collections. VASCULAR: Normal major intracranial vascular flow voids present at skull base, dolichoectasia associated with chronic hypertension. SKULL AND UPPER CERVICAL SPINE: Unchanged partially empty sella. No suspicious calvarial bone marrow signal. Craniocervical junction maintained. SINUSES/ORBITS: Trace paranasal sinus mucosal thickening without air-fluid levels. The included ocular globes and orbital contents are non-suspicious. Status post bilateral ocular lens implants. OTHER: None. IMPRESSION: No acute intracranial process. Similar atrophy, and disproportionate midbrain and tectal volume loss is nonspecific though, can be seen with supranuclear palsy. Old small anterior posterior circulation infarcts. Moderate chronic small vessel ischemic disease. Electronically Signed   By: Elon Alas M.D.   On: 10/11/2016 22:46   Nm Pulmonary Perf And Vent  Result Date: 10/15/2016 CLINICAL DATA:  Shortness of breath.  Dizziness. EXAM: NUCLEAR MEDICINE VENTILATION - PERFUSION LUNG SCAN TECHNIQUE: Ventilation images were obtained in multiple projections using inhaled aerosol Tc-67m DTPA. Perfusion images were obtained in multiple projections after intravenous injection of Tc-68m MAA. RADIOPHARMACEUTICALS:  30 mCi Technetium-17m DTPA aerosol inhalation and 4.1 mCi Technetium-55m MAA IV COMPARISON:  Chest x-ray 10/14/2016 FINDINGS: No significant ventilation perfusion mismatches are noted. Poor ventilation in the upper lobes, perfusion is better. Findings consist with low probability pulmonary embolus . IMPRESSION: Low probability pulmonary embolus. Poor ventilation in the upper lobes, this may be secondary to congestive heart failure. Electronically Signed   By: Marcello Moores  Register   On: 10/15/2016 10:20     Anurag Scarfo, DO  [facility] Pager [phone]  If 7PM-7AM, please contact night-coverage [email or web address] Password [email or web address] 10/20/2016, 6:20 PM   LOS: 6 days

## 2016-10-20 NOTE — Telephone Encounter (Signed)
Called cell, no answer and no vm. I called home number and left message (per DPR) with results and recommendations. Left call back number for any further questions.

## 2016-10-21 DIAGNOSIS — I951 Orthostatic hypotension: Secondary | ICD-10-CM

## 2016-10-21 LAB — BASIC METABOLIC PANEL
ANION GAP: 9 (ref 5–15)
BUN: 30 mg/dL — ABNORMAL HIGH (ref 6–20)
CALCIUM: 8.8 mg/dL — AB (ref 8.9–10.3)
CO2: 25 mmol/L (ref 22–32)
CREATININE: 1.31 mg/dL — AB (ref 0.61–1.24)
Chloride: 102 mmol/L (ref 101–111)
GFR calc Af Amer: 57 mL/min — ABNORMAL LOW (ref >60)
GFR, EST NON AFRICAN AMERICAN: 49 mL/min — AB (ref >60)
Glucose, Bld: 97 mg/dL (ref 65–99)
Potassium: 4 mmol/L (ref 3.5–5.1)
SODIUM: 136 mmol/L (ref 135–145)

## 2016-10-21 LAB — MAGNESIUM: Magnesium: 2 mg/dL (ref 1.7–2.4)

## 2016-10-21 MED ORDER — RIVAROXABAN 15 MG PO TABS: 15.0000 mg | ORAL_TABLET | Freq: Every day | ORAL | 1 refills | Status: DC

## 2016-10-21 MED ORDER — MIDODRINE HCL 5 MG PO TABS: 5.0000 mg | ORAL_TABLET | Freq: Three times a day (TID) | ORAL | 0 refills | Status: DC

## 2016-10-21 NOTE — Discharge Summary (Signed)
Physician Discharge Summary  Alejandro [name] YKD:[id number] DOB: [birth year] DOA: 10/14/2016  PCP: Glenda Chroman, MD  Admit date: 10/14/2016 Discharge date: 10/21/2016  Admitted From: Home Disposition:  Home   Recommendations for Outpatient Follow-up:  1. Follow up with PCP in 1-2 weeks 2. Please obtain BMP in one week  Home Health: yes Equipment/Devices: PT  Discharge Condition: Stable CODE STATUS: FULL Diet recommendation: Heart Healthy   Brief/Interim Summary: Alejandro Amsler Rhodesis a 83 y.o.malewith medical history of CAD, systolic and diastolic CHF, PAF on Xarelto,presents with 2-3 with history of worsening dyspnea on exertion. The patient states that he has been intermittently getting some chest discomfort with exertion, but denies any shortness of breath or chest discomfort at rest. He denies any fevers, chills, nausea, vomiting, diarrhea, coughing, hemoptysis. He denies any worsening lower action and edema, orthopnea or PND. The patient went to see his primary care provider on the day of admission, he was referred to the emergency department for further evaluation secondary to hypoxia. The patient is a poor historian secondary to cognitive impairment. He states that he has been weighing himself at home, and states that his weight has been stable. The patient states that he recently stopped taking his Florinef approximately one week prior to this admission secondary to dyspnea. Notably, the patient has a loop recorder that was recently interrogated by his cardiologist in April 2018. It showed several episodes of atrial fibrillation.  In the emergency department, the patient was noted to be tachycardic with heart rate 110-120 and hypoxic with oxygen saturation 86% on room air. On 3 L nasal cannula, the patient was saturating 94-25 percent. The patient was given furosemide 20 mg IV 1. Chest x-ray showed bilateral interstitial thickening and suggestion of pulmonary vascular congestion. BNP  and CBC were essentially unremarkable. D-dimer was 0.51. BNP was 794.0. He was started in IV diuresis. His heart rate was maintained in the 110s but his blood pressure was borderline soft. Cardiology was then consulted for assistance in managing his atrial fibrillation in setting of hypotension.   Discharge Diagnoses:  PAF--now with RVR -CHADS-VASc = 4 -continue Xarelto -amiodarone to increased to 200mg   - cardiology consult appreciated - patient cardioverted 10/19/16-->sinus  Dizziness and significant orthostatic hypotension -secondary to diuresis--NEG 6 lbs - holding lasix after IV doses -normally not on lasix at home - start midodrine-->improved dizziness -repeat orthostatics improved--lying 116/64 HR 62; sitting 104/77 HR 66; standing 102/74 HR 75  Acute respiratory failure with hypoxia - V/Q scan low probability for PE - likely attributed to CHF -now stable on RA  Acute on chronic systolic and diastolic CHF - no signs of volume overload on exam - lasix stopped - adequately diuresed  -weight 169 on day of d/c  CKD stage 3 -baseline creatinine 1.1-1.4 -creatinine of 1.31 on day of d/c - repeat BMP in am   Discharge Instructions  Discharge Instructions    Diet - low sodium heart healthy    Complete by:  As directed    Increase activity slowly    Complete by:  As directed      Allergies as of 10/21/2016   No Known Allergies     Medication List    STOP taking these medications   fludrocortisone 0.1 MG tablet Commonly known as:  FLORINEF     TAKE these medications   acetaminophen 500 MG tablet Commonly known as:  TYLENOL Take 1,000-1,500 mg by mouth every 6 (six) hours as needed for mild pain or moderate  pain.   amiodarone 200 MG tablet Commonly known as:  PACERONE Take 1 tablet (200 mg total) by mouth daily. What changed:  See the new instructions.   midodrine 5 MG tablet Commonly known as:  PROAMATINE Take 1 tablet (5 mg total) by mouth 3  (three) times daily with meals.   MULTIVITAMIN MEN 50+ PO Take 1 tablet by mouth daily.   Rivaroxaban 15 MG Tabs tablet Commonly known as:  XARELTO Take 1 tablet (15 mg total) by mouth daily with supper. What changed:  medication strength  See the new instructions.            Durable Medical Equipment        Start     Ordered   10/21/16 1146  For home use only DME Walker rolling  Once    Question:  Patient needs a walker to treat with the following condition  Answer:  Orthostatic hypotension   10/21/16 1145     Follow-up Information    [facility] Follow up.   Specialty:  Cardiology Why:  [place] location - you will see Kerin Ransom PA-C on 10/27/16 at 1pm. Please arrive 15 minutes early to check in. Contact information: [place], [facility].   Contact information: [address] [place] [address] [phone]          No Known Allergies  Consultations:  cardiology   Procedures/Studies: Dg Chest 2 View  Result Date: 10/14/2016 CLINICAL DATA:  Shortness of breath on exertion for 2 weeks EXAM: CHEST  2 VIEW COMPARISON:  08/27/2016 FINDINGS: There is diffuse bilateral interstitial thickening. There is no focal parenchymal opacity. There is no pleural effusion or pneumothorax. There is stable cardiomegaly. The osseous structures are unremarkable. IMPRESSION: Cardiomegaly with mild pulmonary vascular congestion. Electronically Signed   By: Kathreen Devoid   On: 10/14/2016 12:50   Mr Brain Wo Contrast  Result Date: 10/11/2016 CLINICAL DATA:  Dizziness, gait instability and weakness for 2 months. History of stroke, hypertension, atrial fibrillation on chronic anti coagulation. EXAM: MRI HEAD WITHOUT CONTRAST TECHNIQUE: Multiplanar, multiecho pulse sequences of the brain and surrounding structures were obtained without intravenous  contrast. COMPARISON:  CT HEAD August 27, 2016 and MRI of the head November 21, 2015 FINDINGS: BRAIN: No reduced diffusion to suggest acute ischemia. No susceptibility artifact to suggest hemorrhage. Moderate to severe ventriculomegaly on the basis of global parenchymal brain volume loss. Disproportionate midbrain and tectal volume loss with mild FLAIR T2 hyperintense signal. Old small LEFT occipital lobe and bilateral mesial cerebellar infarcts. Patchy to confluent supratentorial white matter T2 hyperintensities. Tiny old LEFT frontal cortical infarcts. No midline shift, mass effect or masses. No abnormal extra-axial fluid collections. VASCULAR: Normal major intracranial vascular flow voids present at skull base, dolichoectasia associated with chronic hypertension. SKULL AND UPPER CERVICAL SPINE: Unchanged partially empty sella. No suspicious calvarial bone marrow signal. Craniocervical junction maintained. SINUSES/ORBITS: Trace paranasal sinus mucosal thickening without air-fluid levels. The included ocular globes and orbital contents are non-suspicious. Status post bilateral ocular lens implants. OTHER: None. IMPRESSION: No acute intracranial process. Similar atrophy, and disproportionate midbrain and tectal volume loss is nonspecific though, can be seen with supranuclear palsy. Old small anterior posterior circulation infarcts. Moderate chronic small vessel ischemic disease. Electronically Signed   By: Elon Alas M.D.   On: 10/11/2016 22:46   Nm Pulmonary Perf And Vent  Result Date: 10/15/2016 CLINICAL DATA:  Shortness of breath.  Dizziness. EXAM: NUCLEAR MEDICINE VENTILATION - PERFUSION LUNG SCAN TECHNIQUE: Ventilation images were obtained in multiple projections using inhaled aerosol Tc-36m DTPA. Perfusion images were obtained in multiple projections after intravenous injection of Tc-46m MAA. RADIOPHARMACEUTICALS:  30 mCi Technetium-44m DTPA aerosol inhalation and 4.1 mCi Technetium-17m MAA IV  COMPARISON:  Chest x-ray 10/14/2016 FINDINGS: No significant ventilation perfusion mismatches are noted. Poor ventilation in the upper lobes, perfusion is better. Findings consist with low probability pulmonary embolus . IMPRESSION: Low probability pulmonary embolus. Poor ventilation in the upper lobes, this may be secondary to congestive heart failure. Electronically Signed   By: Marcello Moores  Register   On: 10/15/2016 10:20         Discharge Exam: Vitals:   10/20/16 2159 10/21/16 0602  BP: 107/63 (!) 100/58  Pulse: 60 74  Resp: 20 18  Temp: 98.6 F (37 C) 98.6 F (37 C)   Vitals:   10/20/16 1128 10/20/16 1425 10/20/16 2159 10/21/16 0602  BP:  109/68 107/63 (!) 100/58  Pulse:  74 60 74  Resp:  20 20 18   Temp:  98.2 F (36.8 C) 98.6 F (37 C) 98.6 F (37 C)  TempSrc:   Oral Oral  SpO2: 94% 96% 94% 96%  Weight:    76.7 kg (169 lb 1.5 oz)  Height:        General: Pt is alert, awake, not in acute distress Cardiovascular: RRR, S1/S2 +, no rubs, no gallops Respiratory: CTA bilaterally, no wheezing, no rhonchi Abdominal: Soft, NT, ND, bowel sounds + Extremities: no edema, no cyanosis   The results of significant diagnostics from this hospitalization (including imaging, microbiology, ancillary and laboratory) are listed below for reference.    Significant Diagnostic Studies: Dg Chest 2 View  Result Date: 10/14/2016 CLINICAL DATA:  Shortness of breath on exertion for 2 weeks EXAM: CHEST  2 VIEW COMPARISON:  08/27/2016 FINDINGS: There is diffuse bilateral interstitial thickening. There is no focal parenchymal opacity. There is no pleural effusion or pneumothorax. There is stable cardiomegaly. The osseous structures are unremarkable. IMPRESSION: Cardiomegaly with mild pulmonary vascular congestion. Electronically Signed   By: Kathreen Devoid   On: 10/14/2016 12:50   Mr Brain Wo Contrast  Result Date: 10/11/2016 CLINICAL DATA:  Dizziness, gait instability and weakness for 2 months.  History of stroke, hypertension, atrial fibrillation on chronic anti coagulation. EXAM: MRI HEAD WITHOUT CONTRAST TECHNIQUE: Multiplanar, multiecho pulse sequences of the brain and surrounding structures were obtained without intravenous contrast. COMPARISON:  CT HEAD August 27, 2016 and MRI of the head November 21, 2015 FINDINGS: BRAIN: No reduced diffusion to suggest acute ischemia. No susceptibility artifact to suggest hemorrhage. Moderate to severe ventriculomegaly on the basis of global parenchymal brain volume loss. Disproportionate midbrain and tectal volume loss with mild FLAIR T2 hyperintense signal. Old small LEFT occipital lobe and bilateral mesial cerebellar infarcts. Patchy to confluent supratentorial white matter T2 hyperintensities. Tiny old LEFT frontal cortical infarcts. No midline shift, mass effect or masses. No abnormal extra-axial fluid collections. VASCULAR: Normal major intracranial vascular flow voids present at skull base, dolichoectasia associated with chronic hypertension. SKULL AND UPPER CERVICAL SPINE: Unchanged partially empty sella. No suspicious calvarial bone marrow signal. Craniocervical junction maintained. SINUSES/ORBITS: Trace paranasal sinus mucosal thickening without air-fluid levels. The included ocular globes and orbital contents are non-suspicious. Status post bilateral ocular lens implants. OTHER: None. IMPRESSION: No acute intracranial process. Similar atrophy, and disproportionate midbrain and tectal volume loss is nonspecific  though, can be seen with supranuclear palsy. Old small anterior posterior circulation infarcts. Moderate chronic small vessel ischemic disease. Electronically Signed   By: Elon Alas M.D.   On: 10/11/2016 22:46   Nm Pulmonary Perf And Vent  Result Date: 10/15/2016 CLINICAL DATA:  Shortness of breath.  Dizziness. EXAM: NUCLEAR MEDICINE VENTILATION - PERFUSION LUNG SCAN TECHNIQUE: Ventilation images were obtained in multiple projections using  inhaled aerosol Tc-52m DTPA. Perfusion images were obtained in multiple projections after intravenous injection of Tc-64m MAA. RADIOPHARMACEUTICALS:  30 mCi Technetium-15m DTPA aerosol inhalation and 4.1 mCi Technetium-2m MAA IV COMPARISON:  Chest x-ray 10/14/2016 FINDINGS: No significant ventilation perfusion mismatches are noted. Poor ventilation in the upper lobes, perfusion is better. Findings consist with low probability pulmonary embolus . IMPRESSION: Low probability pulmonary embolus. Poor ventilation in the upper lobes, this may be secondary to congestive heart failure. Electronically Signed   By: Marcello Moores  Register   On: 10/15/2016 10:20     Microbiology: Recent Results (from the past 240 hour(s))  MRSA PCR Screening     Status: None   Collection Time: 10/14/16  6:22 PM  Result Value Ref Range Status   MRSA by PCR NEGATIVE NEGATIVE Final    Comment:        The GeneXpert MRSA Assay (FDA approved for NASAL specimens only), is one component of a comprehensive MRSA colonization surveillance program. It is not intended to diagnose MRSA infection nor to guide or monitor treatment for MRSA infections.      Labs: Basic Metabolic Panel:  Recent Labs Lab 10/17/16 0619 10/18/16 0435 10/19/16 0425 10/20/16 0459 10/21/16 0441  NA 138 136 136 139 136  K 3.8 3.8 4.0 4.2 4.0  CL 102 101 101 103 102  CO2 27 26 26 25 25   GLUCOSE 106* 112* 106* 93 97  BUN 30* 29* 34* 30* 30*  CREATININE 1.21 1.26* 1.31* 1.17 1.31*  CALCIUM 9.0 8.9 9.1 8.9 8.8*  MG 2.0  --   --   --  2.0   Liver Function Tests: No results for input(s): AST, ALT, ALKPHOS, BILITOT, PROT, ALBUMIN in the last 168 hours. No results for input(s): LIPASE, AMYLASE in the last 168 hours. No results for input(s): AMMONIA in the last 168 hours. CBC:  Recent Labs Lab 10/15/16 0555 10/17/16 0619  WBC 8.1 11.8*  NEUTROABS  --  7.5  HGB 12.2* 13.7  HCT 36.7* 42.1  MCV 97.1 99.1  PLT 312 383   Cardiac  Enzymes:  Recent Labs Lab 10/14/16 1825 10/15/16 0007 10/15/16 0555  TROPONINI 0.03* 0.04* 0.04*   BNP: Invalid input(s): POCBNP CBG: No results for input(s): GLUCAP in the last 168 hours.  Time coordinating discharge:  Greater than 30 minutes  Signed:  Zoeie Ritter, DO [facility] Pager: [phone] 10/21/2016, 12:59 PM

## 2016-10-21 NOTE — Care Management Important Message (Signed)
Important Message  Patient Details  Name: MARS SCHEAFFER MRN: [id number] Date of Birth: May 02, 1933   Medicare Important Message Given:  Yes    Sherald Barge, RN 10/21/2016, 12:41 PM

## 2016-10-21 NOTE — Progress Notes (Signed)
Physical Therapy Treatment Patient Details Name: Alejandro [name] MRN: [id number] DOB: 10-10-1932 Today's Date: 10/21/2016    History of Present Illness 83 y.o. male with medical history of CAD, systolic and diastolic CHF, PAF on Xarelto, presents with 2-3 with history of worsening dyspnea on exertion. The patient states that he has been intermittently getting some chest discomfort with exertion, but denies any shortness of breath or chest discomfort at rest. He denies any fevers, chills, nausea, vomiting, diarrhea, coughing, hemoptysis. He denies any worsening lower action and edema, orthopnea or PND. The patient went to see his primary care provider on the day of admission, he was referred to the emergency department for further evaluation secondary to hypoxia. The patient is a poor historian secondary to cognitive impairment. He states that he has been weighing himself at home, and states that his weight has been stable. The patient states that he recently stopped taking his Florinef approximately one week prior to this admission secondary to dyspnea. Notably, the patient has a loop recorder that was recently interrogated by his cardiologist in April 2018. It showed several episodes of atrial fibrillation.  Dx:  Acute respiratory failure with hypoxia, Acute on chronic systolic and diastolic CHF.  S/P Cardioversion today and now in NSR.     PT Comments    Pt received in bed, and was agreeable to PT tx.  Pt expressed that he was feeling much better.  He was able to stand up to the RW, and ambulated 86ft with RW and min guard.  He did express some minor dizziness, but states that it was nothing like what it has been.  Continue to recommend HHPT and use of RW upon d/c.      Follow Up Recommendations  Home health PT;Supervision/Assistance - 24 hour     Equipment Recommendations  Rolling walker with 5" wheels    Recommendations for Other Services       Precautions / Restrictions  Precautions Precautions: Fall Restrictions Weight Bearing Restrictions: No    Mobility  Bed Mobility Overal bed mobility: Modified Independent                Transfers Overall transfer level: Modified independent Equipment used: Rolling walker (2 wheeled) Transfers: Sit to/from Stand Sit to Stand: Modified independent (Device/Increase time)            Ambulation/Gait Ambulation/Gait assistance: Min guard Ambulation Distance (Feet): 80 Feet Assistive device: Rolling walker (2 wheeled) Gait Pattern/deviations: WFL(Within Functional Limits)     General Gait Details: Pt still with some minor complaints of dizziness.   Stairs            Wheelchair Mobility    Modified Rankin (Stroke Patients Only)       Balance Overall balance assessment: Independent Sitting-balance support: No upper extremity supported;Feet supported       Standing balance support: Bilateral upper extremity supported Standing balance-Leahy Scale: Fair                              Cognition Arousal/Alertness: Awake/alert Behavior During Therapy: WFL for tasks assessed/performed Overall Cognitive Status: Within Functional Limits for tasks assessed                                        Exercises      General Comments  Pertinent Vitals/Pain Pain Assessment: No/denies pain    Home Living                      Prior Function            PT Goals (current goals can now be found in the care plan section) Acute Rehab PT Goals Patient Stated Goal: To go home.  PT Goal Formulation: With patient Time For Goal Achievement: 11/01/16 Potential to Achieve Goals: Good Progress towards PT goals: Progressing toward goals    Frequency    Min 3X/week      PT Plan Current plan remains appropriate    Co-evaluation              AM-PAC PT "6 Clicks" Daily Activity  Outcome Measure  Difficulty turning over in bed (including  adjusting bedclothes, sheets and blankets)?: None Difficulty moving from lying on back to sitting on the side of the bed? : None Difficulty sitting down on and standing up from a chair with arms (e.g., wheelchair, bedside commode, etc,.)?: None Help needed moving to and from a bed to chair (including a wheelchair)?: A Little Help needed walking in hospital room?: A Little Help needed climbing 3-5 steps with a railing? : A Little 6 Click Score: 21    End of Session Equipment Utilized During Treatment: Gait belt Activity Tolerance: Patient tolerated treatment well Patient left: in chair;with call bell/phone within reach Nurse Communication: Mobility status PT Visit Diagnosis: Dizziness and giddiness (R42);Muscle weakness (generalized) (M62.81);Other abnormalities of gait and mobility (R26.89);Unsteadiness on feet (R26.81)     Time: 2336-1224 PT Time Calculation (min) (ACUTE ONLY): 12 min  Charges:  $Gait Training: 8-22 mins                    G Codes:       Beth Helana Macbride, PT, DPT X: [phone]

## 2016-10-21 NOTE — Care Management (Signed)
    Durable Medical Equipment        Start     Ordered   10/21/16 1146  For home use only DME Walker rolling  Once    Question:  Patient needs a walker to treat with the following condition  Answer:  Orthostatic hypotension   10/21/16 1145

## 2016-10-21 NOTE — Care Management Note (Signed)
Case Management Note  Patient Details  Name: Alejandro [name] MRN: [id number] Date of Birth: 19-Dec-1932  Subjective/Objective:                  Pt admitted with CHF. He is from home, lives with SO. He is ind with ADL's at baseline. He has cane to use if needed. He has weaned from his oxygen. He will need HH PT and RW. Pt has chosen [facility] from list of [facility] providers. Pt aware HH has 48hrs to make first visit. Pt communicates no other needs.   Action/Plan: Pt discharging home today with self care. Romualdo Bolk, of [facility], aware of referral and will obtain pt info from chart RW will be delivered to pt room prior to DC.   Expected Discharge Date:       10/21/2016           Expected Discharge Plan:  [place]  In-House Referral:  NA  Discharge planning Services  CM Consult  Post Acute Care Choice:  Durable Medical Equipment, Home Health Choice offered to:  Patient  DME Arranged:  Walker rolling DME Agency:  [place]:  PT [facility] Agency:  [place]  Status of Service:  Completed, signed off  If discussed at [place] of Stay Meetings, dates discussed: 10/21/2016   Sherald Barge, RN 10/21/2016, 12:38 PM

## 2016-10-21 NOTE — Progress Notes (Signed)
IV removed, WNL. D/C instructions given to pt. Verbalized understanding. Awaiting wife to come transport home.

## 2016-10-22 ENCOUNTER — Encounter (HOSPITAL_COMMUNITY): Payer: Self-pay | Admitting: Cardiovascular Disease

## 2016-10-26 ENCOUNTER — Ambulatory Visit (INDEPENDENT_AMBULATORY_CARE_PROVIDER_SITE_OTHER): Payer: Medicare Other | Admitting: *Deleted

## 2016-10-26 DIAGNOSIS — Z6826 Body mass index (BMI) 26.0-26.9, adult: Secondary | ICD-10-CM | POA: Diagnosis not present

## 2016-10-26 DIAGNOSIS — R55 Syncope and collapse: Secondary | ICD-10-CM

## 2016-10-26 DIAGNOSIS — E7800 Pure hypercholesterolemia, unspecified: Secondary | ICD-10-CM | POA: Diagnosis not present

## 2016-10-26 DIAGNOSIS — Z299 Encounter for prophylactic measures, unspecified: Secondary | ICD-10-CM | POA: Diagnosis not present

## 2016-10-26 DIAGNOSIS — F0390 Unspecified dementia without behavioral disturbance: Secondary | ICD-10-CM | POA: Diagnosis not present

## 2016-10-26 DIAGNOSIS — I10 Essential (primary) hypertension: Secondary | ICD-10-CM | POA: Diagnosis not present

## 2016-10-26 DIAGNOSIS — I639 Cerebral infarction, unspecified: Secondary | ICD-10-CM | POA: Diagnosis not present

## 2016-10-26 DIAGNOSIS — N400 Enlarged prostate without lower urinary tract symptoms: Secondary | ICD-10-CM | POA: Diagnosis not present

## 2016-10-26 DIAGNOSIS — I429 Cardiomyopathy, unspecified: Secondary | ICD-10-CM | POA: Diagnosis not present

## 2016-10-26 DIAGNOSIS — K5790 Diverticulosis of intestine, part unspecified, without perforation or abscess without bleeding: Secondary | ICD-10-CM | POA: Diagnosis not present

## 2016-10-26 DIAGNOSIS — I509 Heart failure, unspecified: Secondary | ICD-10-CM | POA: Diagnosis not present

## 2016-10-26 DIAGNOSIS — I951 Orthostatic hypotension: Secondary | ICD-10-CM | POA: Diagnosis not present

## 2016-10-27 ENCOUNTER — Ambulatory Visit: Payer: Medicare Other | Admitting: Cardiology

## 2016-10-28 NOTE — Progress Notes (Signed)
Carelink Summary Report 

## 2016-10-28 NOTE — Progress Notes (Deleted)
Carelink Summary Report 

## 2016-10-29 LAB — CUP PACEART REMOTE DEVICE CHECK
Implantable Pulse Generator Implant Date: 20160307
MDC IDC SESS DTM: 20180527060939

## 2016-11-02 ENCOUNTER — Encounter: Payer: Self-pay | Admitting: Adult Health

## 2016-11-02 ENCOUNTER — Ambulatory Visit (INDEPENDENT_AMBULATORY_CARE_PROVIDER_SITE_OTHER): Payer: Medicare Other | Admitting: Adult Health

## 2016-11-02 VITALS — BP 102/60 | HR 81 | Ht 68.0 in | Wt 171.0 lb

## 2016-11-02 DIAGNOSIS — I480 Paroxysmal atrial fibrillation: Secondary | ICD-10-CM | POA: Diagnosis not present

## 2016-11-02 DIAGNOSIS — I429 Cardiomyopathy, unspecified: Secondary | ICD-10-CM

## 2016-11-02 DIAGNOSIS — I2510 Atherosclerotic heart disease of native coronary artery without angina pectoris: Secondary | ICD-10-CM | POA: Diagnosis not present

## 2016-11-02 NOTE — Progress Notes (Signed)
Cardiology Office Note   Date:  11/02/2016   ID:  Alejandro [name], DOB [birth year], MRN [id number]  PCP:  Glenda Chroman, MD  Cardiologist:  Doreatha Lew chief complaint on file.     History of Present Illness: Alejandro [name] is a 83 y.o. male who presents for ongoing assessment and management of CAD, mixed CHF, PAF on Xarelto, we are seeing post hospitalization after admission for Afib with RVR, CHADS score of 4, acute respiratory failure with hypoxia, CKD stage 3, with dizziness and orthostatic hypotension and decompensated CHF. He was treated with IV diureses, given increased dose of amiodarone to 200 mg daily, and was cardioverted on 10/19/2016. He was started on midodrine.   Pacemaker Check 5\29/2018  Carelink summary report received. Battery status OK. Normal device function. No new symptom episodes, tachy episodes, brady, or pause episodes. 39 AF episodes (51.5% burden)+Xarelto, Amiodarone. Monthly summary reports and ROV with [facility] RN,   He comes today with multiple complaints of fatigue. He is angry that he cannot do the things he used to do. He has refused PT or cardiac rehab which was offered in the hospital. He is also unhappy with low sodium diet.   Past Medical History:  Diagnosis Date  . CAD (coronary artery disease)    Moderate multivessel March 2016  . CHF (congestive heart failure) ([name])   . Essential hypertension   . Memory loss   . NSVT (nonsustained ventricular tachycardia) ([name])    Negative EP study  . Paroxysmal atrial fibrillation ([name])   . Secondary cardiomyopathy ([name])    LVEF 40-45% January 2014  . Syncope   . TIA (transient ischemic attack)     Past Surgical History:  Procedure Laterality Date  . CARDIOVERSION N/A 10/19/2016   Procedure: CARDIOVERSION;  Surgeon: [name], MD;  Location: [facility] ENDO SUITE;  Service: Cardiovascular;  Laterality: N/A;  . CATARACT EXTRACTION    . COLONOSCOPY  unknown  . COLONOSCOPY N/A 09/05/2014   Procedure: COLONOSCOPY;  Surgeon: [name], MD;  Location: [facility] ENDO SUITE;  Service: Endoscopy;  Laterality: N/A;  145pm  . ELECTROPHYSIOLOGY STUDY N/A 08/05/2014   Procedure: ELECTROPHYSIOLOGY STUDY;  Surgeon: Deboraha Sprang, MD;  Location[facility] LAB;  Service: Cardiovascular;  Laterality: N/A;  . LAPAROSCOPIC APPENDECTOMY  November 2015   Dr. [name]  . LEFT HEART CATHETERIZATION WITH CORONARY ANGIOGRAM N/A 07/31/2014   Procedure: LEFT HEART CATHETERIZATION WITH CORONARY ANGIOGRAM;  Surgeon: [name], MD;  Location: [facility] CATH LAB;  Service: Cardiovascular;  Laterality: N/A;  . LOOP RECORDER IMPLANT       Current Outpatient Prescriptions  Medication Sig Dispense Refill  . acetaminophen (TYLENOL) 500 MG tablet Take 1,000-1,500 mg by mouth every 6 (six) hours as needed for mild pain or moderate pain.    Marland Kitchen amiodarone (PACERONE) 200 MG tablet Take 1 tablet (200 mg total) by mouth daily. 30 tablet 1  . midodrine (PROAMATINE) 5 MG tablet Take 1 tablet (5 mg total) by mouth 3 (three) times daily with meals. 90 tablet 0  . Rivaroxaban (XARELTO) 15 MG TABS tablet Take 1 tablet (15 mg total) by mouth daily with supper. 30 tablet 1  . Multiple Vitamins-Minerals (MULTIVITAMIN MEN 50+ PO) Take 1 tablet by mouth daily.     No current facility-administered medications for this visit.     Allergies:   Patient has no known allergies.    Social History:  The patient  reports that he has never  smoked. He has never used smokeless tobacco. He reports that he does not drink alcohol or use drugs.   Family History:  The patient's family history includes Alzheimer's disease in his mother; Arthritis in his father; Heart disease in his father.    ROS: All other systems are reviewed and negative. Unless otherwise mentioned in H&P    PHYSICAL EXAM: VS:  BP 102/60   Pulse 81   Ht 5\' 8"  (1.727 m)   Wt 171 lb (77.6 kg)   SpO2 90%   BMI 26.00 kg/m  , BMI Body mass index is 26 kg/m. GEN: Well  nourished, well developed, in no acute distress  HEENT: normal  Neck: no JVD, carotid bruits, or masses Cardiac: RRR; occasional irregular beats,  no murmurs, rubs, or gallops,no edema  Respiratory:  Crackles bibasilar without wheezes,  GI: soft, nontender, nondistended, + BS MS: no deformity or atrophy  Skin: warm and dry, no rash Neuro:  Strength and sensation are intact Psych: euthymic mood, full affect   EKG:  Sr with frequent PVC's and occasional PAC. Rate of 76 bpm.   Recent Labs: 10/14/2016: B Natriuretic Peptide 794.0 10-19-2016: TSH <0.010 10/17/2016: Hemoglobin 13.7; Platelets 383 10/21/2016: BUN 30; Creatinine, Ser 1.31; Magnesium 2.0; Potassium 4.0; Sodium 136    Lipid Panel    Component Value Date/Time   CHOL 138 08/03/2014 0304   TRIG 73 08/03/2014 0304   HDL 33 (L) 08/03/2014 0304   CHOLHDL 4.2 08/03/2014 0304   VLDL 15 08/03/2014 0304   LDLCALC 90 08/03/2014 0304      Wt Readings from Last 3 Encounters:  11/02/16 171 lb (77.6 kg)  10/21/16 169 lb 1.5 oz (76.7 kg)  10/05/16 186 lb (84.4 kg)      Other studies Reviewed: Echocardiogram 10/19/2016 Left ventricle: The cavity size was normal. Wall thickness was   increased in a pattern of mild LVH. Systolic function was mildly   to moderately reduced. The estimated ejection fraction was in the   range of 40% to 45%. Diffuse hypokinesis. There is hypokinesis of   the basal-midinferolateral myocardium. - Aortic valve: Mildly calcified annulus. Trileaflet; mildly   calcified leaflets. There was trivial regurgitation. - Mitral valve: There was mild regurgitation. - Left atrium: The atrium was at the upper limits of normal in   size. - Right atrium: The atrium was mildly dilated. - Tricuspid valve: There was trivial regurgitation. - Pulmonary arteries: PA peak pressure: 31 mm Hg (S). - Pericardium, extracardiac: There was no pericardial effusion.  Coronary angiography: 08/08/2014 Coronary dominance:  Right   Left Main:  Normal in size with minor irregularities. The vessel is mildly calcified.  Left Anterior Descending (LAD):  Heavily calcified especially proximally. The vessel has mild diffuse atherosclerosis with no obstructive disease.  1st diagonal (D1):  Medium in size with 50% ostial stenosis.  2nd diagonal (D2):  Large in size with 50% proximal stenosis. This gives to normal size branches.  3rd diagonal (D3):  Very small in size.  Circumflex (LCx):  Normal in size with 60-70% ostial stenosis. There is 20% diffuse disease in the midsegment.  1st obtuse marginal:  Small in size with minor irregularities.  2nd obtuse marginal:  Medium in size with minor irregularities.  3rd obtuse marginal:  Normal in size with minor irregularities.            Ramus Intermedius:  Medium in size with 60% ostial stenosis.  Right Coronary Artery:  Large in size and aneurysmal throughout its  segment. Its tortuous proximally. There is sluggish flow throughout the vessel with possible layered thrombus distally.  Posterior descending artery: Aneurysmal proximally with no significant stenosis.  Posterior AV segment: Normal in size with mild diffuse disease.  Left ventriculography: Left ventricular systolic function is moderately to severely reduced , LVEF is estimated at 30-35 %, there is no significant mitral regurgitation   ASSESSMENT AND PLAN:  1.  Atrial fib with RVR: Now controlled and predominately in SR. Having some PAC;s and occasional PVC. He had had labs drawn by PCP. This included CBC and BMET. Will need copies. Continue amiodarone   2. NICM: Most recent echocardiogram EF of 40-45%. Better than prior results per cath in 2016. He is fatigued and does not want to adhere to low sodium diet. He states that he continues to lose weight because he does not like unseasoned foods. He is not on diuretic. No evidence of fluid retention.   3. CAD: Last cath 07/31/2014. Medical therapy is  recommended. He is unhappy about adherence of low sodium low cholesterol diet. I have encouraged him to try more healthy options. He is given a low sodium diet in written form.   4. Orthostatic Hypotension: On midodrine TID. BP is low normal.    Current medicines are reviewed at length with the patient today.    Labs/ tests ordered today include: None  Phill Myron. [place] Pugh, ANP, AACC   11/02/2016 1:47 PM    [facility] [address] [place], [place] [address] Phone: [phone]; Fax: [phone]

## 2016-11-02 NOTE — Patient Instructions (Addendum)
Your physician recommends that you schedule a follow-up appointment in: 3 months in [place] with Dr Domenic Polite (02/01/17) at 79 am   Your physician recommends that you continue on your current medications as directed. Please refer to the Current Medication list given to you today.    Follow low sodium guidelines I have given you    No testing or labs ordered today.      Thank you for choosing [facility] !

## 2016-11-03 ENCOUNTER — Other Ambulatory Visit: Payer: Self-pay | Admitting: Gastroenterology

## 2016-11-03 ENCOUNTER — Encounter: Payer: Self-pay | Admitting: Cardiology

## 2016-11-03 DIAGNOSIS — K625 Hemorrhage of anus and rectum: Secondary | ICD-10-CM | POA: Diagnosis not present

## 2016-11-03 LAB — CBC WITH DIFFERENTIAL/PLATELET
BASOS ABS: 75 {cells}/uL (ref 0–200)
BASOS PCT: 1 %
EOS ABS: 600 {cells}/uL — AB (ref 15–500)
Eosinophils Relative: 8 %
HEMATOCRIT: 42.2 % (ref 38.5–50.0)
Hemoglobin: 13.8 g/dL (ref 13.2–17.1)
LYMPHS PCT: 29 %
Lymphs Abs: 2175 cells/uL (ref 850–3900)
MCH: 31 pg (ref 27.0–33.0)
MCHC: 32.7 g/dL (ref 32.0–36.0)
MCV: 94.8 fL (ref 80.0–100.0)
MONO ABS: 825 {cells}/uL (ref 200–950)
MPV: 10.6 fL (ref 7.5–12.5)
Monocytes Relative: 11 %
Neutro Abs: 3825 cells/uL (ref 1500–7800)
Neutrophils Relative %: 51 %
Platelets: 286 10*3/uL (ref 140–400)
RBC: 4.45 MIL/uL (ref 4.20–5.80)
RDW: 13.9 % (ref 11.0–15.0)
WBC: 7.5 10*3/uL (ref 3.8–10.8)

## 2016-11-10 NOTE — Progress Notes (Signed)
Please let patient know his Hgb is normal.  Follow up as needed. If notices rectal bleeding, black stools, change in bowels, weight loss, let us know.

## 2016-11-19 ENCOUNTER — Telehealth: Payer: Self-pay | Admitting: *Deleted

## 2016-11-19 ENCOUNTER — Telehealth: Payer: Self-pay | Admitting: Cardiovascular Disease

## 2016-11-19 NOTE — Telephone Encounter (Signed)
Spoke with patient in regards to 2 pause episodes and 2 brady episodes, no ECGs available. Patient states no symptoms of syncope or presyncope since his cardioversion 10/19/16. Advised patient if symptoms of presyncope or syncope occur to go to the Emergency Room. Patient verbalized understanding.   Advised patient once he received his new Carelink monitor to send a manual transmission. Patient verbalized understanding and stated he would call the [facility] when new monitor arrived.

## 2016-11-19 NOTE — Telephone Encounter (Signed)
Attempted to troubleshoot home monitor. Unable to get transmission to send. 800# given.

## 2016-11-19 NOTE — Telephone Encounter (Signed)
Follow up   Pt wife is calling stating that her husband is going to get a new monitor because his is not working. She said they could send a new transmission when they get the new machine if you want them to.

## 2016-11-19 NOTE — Telephone Encounter (Signed)
New Message  Pt wife call requesting to speak with Device for assistance sending a transmission. Please call back to discuss

## 2016-11-19 NOTE — Telephone Encounter (Signed)
LMOVM regarding sending a manual transmission. 2 pause episodes, 2 brady episodes-- No available ECGs. [facility] phone number to call back with questions or concerns.

## 2016-11-23 ENCOUNTER — Ambulatory Visit (INDEPENDENT_AMBULATORY_CARE_PROVIDER_SITE_OTHER): Payer: Medicare Other | Admitting: *Deleted

## 2016-11-23 DIAGNOSIS — R55 Syncope and collapse: Secondary | ICD-10-CM

## 2016-11-23 NOTE — Progress Notes (Signed)
Carelink Summary Report / Loop Recorder 

## 2016-11-26 NOTE — Telephone Encounter (Signed)
Manual Transmission received. Spoke with patient regarding symptoms associated with pause episodes. 5 pause episodes noted-- (4 ECGs appear to be 3 second pause, 1 ECG appeared undersensing), longest 3 seconds. 2 Brady episodes-- ECG appears SR with PVCs, undersensing. Patient states no symptoms associated with pause episodes, but c/o of chronic fatigue and SOB. Advised patient we will review with Dr. Rayann Heman and will call back if he has further recommendations. Patient verbalized understanding.

## 2016-12-05 LAB — CUP PACEART REMOTE DEVICE CHECK
Date Time Interrogation Session: 20180626081142
MDC IDC PG IMPLANT DT: 20160307

## 2016-12-05 NOTE — Progress Notes (Signed)
Carelink summary report received. Battery status OK. Normal device function. No new symptom episodes or tachy episodes. 2 pause, 2 brady, 133 AF 20.4% burden. +Amio +Xarelto. No ECGs. Per 11/19/16 EPIC note, pt denies any symptoms other than chronic fatigue and shortness of breath. Monthly summary reports and ROV/PRN

## 2016-12-23 ENCOUNTER — Ambulatory Visit (INDEPENDENT_AMBULATORY_CARE_PROVIDER_SITE_OTHER): Payer: Medicare Other | Admitting: *Deleted

## 2016-12-23 DIAGNOSIS — R55 Syncope and collapse: Secondary | ICD-10-CM

## 2016-12-23 NOTE — Progress Notes (Signed)
Carelink Summary Report / Loop Recorder 

## 2016-12-30 ENCOUNTER — Other Ambulatory Visit: Payer: Self-pay | Admitting: *Deleted

## 2016-12-30 MED ORDER — MIDODRINE HCL 5 MG PO TABS: 5.0000 mg | ORAL_TABLET | Freq: Three times a day (TID) | ORAL | 1 refills | Status: DC

## 2016-12-30 MED ORDER — RIVAROXABAN 15 MG PO TABS: 15.0000 mg | ORAL_TABLET | Freq: Every day | ORAL | 1 refills | Status: DC

## 2017-01-06 LAB — CUP PACEART REMOTE DEVICE CHECK
Date Time Interrogation Session: 20180726100944
Implantable Pulse Generator Implant Date: 20160307

## 2017-01-12 ENCOUNTER — Other Ambulatory Visit: Payer: Self-pay | Admitting: Internal Medicine

## 2017-01-19 ENCOUNTER — Telehealth: Payer: Self-pay | Admitting: *Deleted

## 2017-01-19 NOTE — Telephone Encounter (Signed)
Spoke with patient regarding sending manual transmission and inquired about symptoms associated with pause episodes. 2 pause episodes, 1 available ECG appears 3 second pause at 00:42 August 21st. Patient states he has not had any symptoms of syncope or presyncope. Patient states he will send manual transmission. Advised patient will review manual transmission once received and will call back if further recommendations. Patient verbalized understanding.

## 2017-01-21 NOTE — Telephone Encounter (Signed)
Manual transmission received.  Both pause episodes are from 01/18/17, both nocturnal.  ECGs printed and placed in Dr. Otilio Connors folder for review.

## 2017-01-24 ENCOUNTER — Ambulatory Visit (INDEPENDENT_AMBULATORY_CARE_PROVIDER_SITE_OTHER): Payer: Medicare Other | Admitting: *Deleted

## 2017-01-24 DIAGNOSIS — R55 Syncope and collapse: Secondary | ICD-10-CM | POA: Diagnosis not present

## 2017-01-24 NOTE — Progress Notes (Signed)
Carelink Summary Report / Loop Recorder 

## 2017-01-28 LAB — CUP PACEART REMOTE DEVICE CHECK
Implantable Pulse Generator Implant Date: 20160307
MDC IDC SESS DTM: 20180825142100

## 2017-01-31 ENCOUNTER — Encounter: Payer: Self-pay | Admitting: Cardiology

## 2017-01-31 NOTE — Progress Notes (Signed)
Cardiology Office Note  Date: 02/01/2017   ID: Alejandro [name], DOB 09/13/1932, MRN [id number]  PCP: Glenda Chroman, MD  Primary Cardiologist: Rozann Lesches, MD   Chief Complaint  Patient presents with  . Cardiomyopathy  . PAF    History of Present Illness: Alejandro [name] is an 83 y.o. male last seen in the office in June by Ms. West Pugh. He presents today with his wife for follow-up visit. Complains of chronic lack of energy, has to rest when he is standing and working for 10-15 minutes. He has had trouble with orthostatic hypotension which is contributing to some of his symptoms, had been placed on Proamatine but states that he ran out of the medication and did not call for a refill. He tolerated this medicine better than Florinef.  Recent loop recorder interrogation by Dr. Rayann Heman on August 27 reported no new atrial fibrillation events.he last underwent electrical cardioversion of atrial fibrillation back in May of this year and has been maintained on amiodarone.  His wife states he does occasionally gain weight and asked about using a low-dose diuretic intermittently. He does not report orthopnea or PND.  We discussed obtaining follow-up lab work on Xarelto and amiodarone. He denies any bleeding problems.  Past Medical History:  Diagnosis Date  . CAD (coronary artery disease)    Moderate multivessel March 2016  . Essential hypertension   . Memory loss   . NSVT (nonsustained ventricular tachycardia) ([name])    Negative EP study  . Paroxysmal atrial fibrillation ([name])   . Secondary cardiomyopathy ([place])    LVEF 40-45% January 2014  . Syncope   . TIA (transient ischemic attack)     Past Surgical History:  Procedure Laterality Date  . CARDIOVERSION N/A 10/19/2016   Procedure: CARDIOVERSION;  Surgeon: [name], MD;  [facility];  Service: Cardiovascular;  Laterality: N/A;  . CATARACT EXTRACTION    . COLONOSCOPY  unknown  . COLONOSCOPY N/A 09/05/2014     Procedure: COLONOSCOPY;  Surgeon: [name], MD;  [facility];  Service: Endoscopy;  Laterality: N/A;  145pm  . ELECTROPHYSIOLOGY STUDY N/A 08/05/2014   Procedure: ELECTROPHYSIOLOGY STUDY;  Surgeon: Deboraha Sprang, MD;  Location: [facility] CATH LAB;  Service: Cardiovascular;  Laterality: N/A;  . LAPAROSCOPIC APPENDECTOMY  November 2015   Dr. [name]  . LEFT HEART CATHETERIZATION WITH CORONARY ANGIOGRAM N/A 07/31/2014   Procedure: LEFT HEART CATHETERIZATION WITH CORONARY ANGIOGRAM;  Surgeon: [name], MD;  Location: [facility] CATH LAB;  Service: Cardiovascular;  Laterality: N/A;  . LOOP RECORDER IMPLANT      Current Outpatient Prescriptions  Medication Sig Dispense Refill  . acetaminophen (TYLENOL) 500 MG tablet Take 1,000-1,500 mg by mouth every 6 (six) hours as needed for mild pain or moderate pain.    Marland Kitchen amiodarone (PACERONE) 200 MG tablet Take 1 tablet (200 mg total) by mouth daily. 30 tablet 1  . Multiple Vitamins-Minerals (MULTIVITAMIN MEN 50+ PO) Take 1 tablet by mouth daily.    . Rivaroxaban (XARELTO) 15 MG TABS tablet Take 1 tablet (15 mg total) by mouth daily with supper. 90 tablet 1  . furosemide (LASIX) 20 MG tablet Take 1 tablet (20 mg total) by mouth as needed (WEIGHT GAIN OF 2LBS OR MORE IN 24 HOURS). 30 tablet 3  . midodrine (PROAMATINE) 5 MG tablet Take 1 tablet (5 mg total) by mouth 3 (three) times daily with meals. 90 tablet 3   No current facility-administered  medications for this visit.    Allergies:  Patient has no known allergies.   Social History: The patient  reports that he has never smoked. He has never used smokeless tobacco. He reports that he does not drink alcohol or use drugs.   ROS:  Please see the history of present illness. Otherwise, complete review of systems is positive for hearing and memory loss.  All other systems are reviewed and negative.   Physical Exam: VS:  BP 98/68   Pulse 73   Ht 5\' 8"  (1.727 m)   Wt 176 lb (79.8 kg)   SpO2 93%    BMI 26.76 kg/m , BMI Body mass index is 26.76 kg/m.  Wt Readings from Last 3 Encounters:  02/01/17 176 lb (79.8 kg)  11/02/16 171 lb (77.6 kg)  10/21/16 169 lb 1.5 oz (76.7 kg)    General: Elderly male, appears comfortable at rest. HEENT: Conjunctiva and lids normal, oropharynx clear with moist mucosa. Neck: Supple, no elevated JVP or carotid bruits, no thyromegaly. Lungs: Clear to auscultation, nonlabored breathing at rest. Cardiac: Regular rate and rhythm, no S3, soft systolic murmur, no pericardial rub. Abdomen: Soft, nontender, bowel sounds present, no guarding or rebound. Extremities: No pitting edema, distal pulses 2+. Skin: Warm and dry. Musculoskeletal: No kyphosis. Neuropsychiatric: Alert and oriented x3, affect grossly appropriate.  ECG: I personally reviewed the tracing from 11/02/2016 showed sinus rhythm with intermittent fusion beats, poor R-wave progression, nonspecific ST changes.  Recent Labwork: 10/14/2016: B Natriuretic Peptide 794.0 10/15/2016: TSH <0.010 10/21/2016: BUN 30; Creatinine, Ser 1.31; Magnesium 2.0; Potassium 4.0; Sodium 136 11/03/2016: Hemoglobin 13.8; Platelets 286     Component Value Date/Time   CHOL 138 08/03/2014 0304   TRIG 73 08/03/2014 0304   HDL 33 (L) 08/03/2014 0304   CHOLHDL 4.2 08/03/2014 0304   VLDL 15 08/03/2014 0304   LDLCALC 90 08/03/2014 0304    Other Studies Reviewed Today:  Echocardiogram 10/15/2016: Study Conclusions  - Left ventricle: The cavity size was normal. Wall thickness was   increased in a pattern of mild LVH. Systolic function was mildly   to moderately reduced. The estimated ejection fraction was in the   range of 40% to 45%. Diffuse hypokinesis. There is hypokinesis of   the basal-midinferolateral myocardium. - Aortic valve: Mildly calcified annulus. Trileaflet; mildly   calcified leaflets. There was trivial regurgitation. - Mitral valve: There was mild regurgitation. - Left atrium: The atrium was at the  upper limits of normal in   size. - Right atrium: The atrium was mildly dilated. - Tricuspid valve: There was trivial regurgitation. - Pulmonary arteries: PA peak pressure: 31 mm Hg (S). - Pericardium, extracardiac: There was no pericardial effusion.  Impressions:  - Mild LVH with LVEF approximately 40-45% in the setting of atrial   fibrillation. There is diffuse hypokinesis, most prominent in the   inferolateral wall. Indeterminate diastolic function. Upper   normal left atrial chamber size. Mild mitral regurgitation.   Mildly sclerotic aortic valve with trivial aortic regurgitation.   Mildly dilated right atrium. Trivial tricuspid regurgitation with   PASP estimated 31 mmHg.  Assessment and Plan:  1. Paroxysmal atrial fibrillation. No recent breakthrough episodes on amiodarone and Xarelto. We will obtain follow-up lab work including CBC, CMET, and TSH.  2. Nonischemic cardiomyopathy, most recent LVEF 40-45% range. He has had medication intolerances that limit his current regimen. Will provide Lasix 20 mg to be used for weight gain of 2-3 pounds in 24 hours or progressive leg  swelling. Not sure that he would tolerate a standing diuretic with his orthostatic hypotension.  3. Orthostatic hypotension. Resume Proamatine, refill sent in.  4. History of syncope, no recent occurrence. Recorder in place. I reviewed the recent interrogations. He did have a few 3 second pauses in early August, but not clearly symptomatic.  Current medicines were reviewed with the patient today.   Orders Placed This Encounter  Procedures  . CBC  . Comprehensive Metabolic Panel (CMET)  . TSH    Disposition: Follow-up in 4 months.  Signed, Satira Sark, MD, [facility] 02/01/2017 9:39 AM    [place] [facility] [place], [place], [place] [address] Phone: [phone]; Fax: [phone]

## 2017-02-01 ENCOUNTER — Ambulatory Visit (INDEPENDENT_AMBULATORY_CARE_PROVIDER_SITE_OTHER): Payer: Medicare Other | Admitting: Cardiology

## 2017-02-01 ENCOUNTER — Encounter: Payer: Self-pay | Admitting: Cardiology

## 2017-02-01 VITALS — BP 98/68 | HR 73 | Ht 68.0 in | Wt 176.0 lb

## 2017-02-01 DIAGNOSIS — I2510 Atherosclerotic heart disease of native coronary artery without angina pectoris: Secondary | ICD-10-CM | POA: Diagnosis not present

## 2017-02-01 DIAGNOSIS — Z87898 Personal history of other specified conditions: Secondary | ICD-10-CM

## 2017-02-01 DIAGNOSIS — I480 Paroxysmal atrial fibrillation: Secondary | ICD-10-CM | POA: Diagnosis not present

## 2017-02-01 DIAGNOSIS — I951 Orthostatic hypotension: Secondary | ICD-10-CM | POA: Diagnosis not present

## 2017-02-01 DIAGNOSIS — I428 Other cardiomyopathies: Secondary | ICD-10-CM

## 2017-02-01 MED ORDER — MIDODRINE HCL 5 MG PO TABS: 5.0000 mg | ORAL_TABLET | Freq: Three times a day (TID) | ORAL | 3 refills | Status: DC

## 2017-02-01 MED ORDER — FUROSEMIDE 20 MG PO TABS: 20.0000 mg | ORAL_TABLET | ORAL | 3 refills | Status: DC | PRN

## 2017-02-01 NOTE — Patient Instructions (Signed)
Medication Instructions:  Your physician has recommended you make the following change in your medication:  Begin lasix 20 mg as needed for excessive leg swelling or weight gain of 2 lbs or more in 24 hours  Please continue all other medications as prescribed  Labwork: CBC, CMET, TSH- Orders given today   Testing/Procedures: NONE  Follow-Up: Your physician recommends that you schedule a follow-up appointment in: 4 MONTHS WITH DR. MCDOWELL  Any Other Special Instructions Will Be Listed Below (If Applicable).  If you need a refill on your cardiac medications before your next appointment, please call your pharmacy.

## 2017-02-03 ENCOUNTER — Telehealth: Payer: Self-pay

## 2017-02-03 MED ORDER — AMIODARONE HCL 100 MG PO TABS: 100.0000 mg | ORAL_TABLET | Freq: Every day | ORAL | 3 refills | Status: DC

## 2017-02-03 NOTE — Telephone Encounter (Signed)
-----   Message from Acquanetta Chain, LPN sent at 08/05/8873 12:01 PM EDT -----   ----- Message ----- From: Satira Sark, MD Sent: 02/02/2017  10:19 AM To: Merlene Laughter, LPN  Results reviewed. Lab work reviewed. Renal function stable, LFTs normal, hemoglobin normal, TSH mildly increased at 7.48. Recommend cutting amiodarone dose back to 100 mg daily. A copy of this test should be forwarded to Glenda Chroman, MD.

## 2017-02-03 NOTE — Telephone Encounter (Signed)
Patient notified and verbalized understanding. Routed to PCP  

## 2017-02-21 ENCOUNTER — Ambulatory Visit (INDEPENDENT_AMBULATORY_CARE_PROVIDER_SITE_OTHER): Payer: Medicare Other | Admitting: *Deleted

## 2017-02-21 DIAGNOSIS — R55 Syncope and collapse: Secondary | ICD-10-CM

## 2017-02-22 LAB — CUP PACEART REMOTE DEVICE CHECK
Date Time Interrogation Session: 20180924141413
MDC IDC PG IMPLANT DT: 20160307

## 2017-02-22 NOTE — Progress Notes (Signed)
Carelink Summary Report / Loop Recorder 

## 2017-02-26 ENCOUNTER — Other Ambulatory Visit: Payer: Self-pay | Admitting: Cardiology

## 2017-03-07 ENCOUNTER — Telehealth: Payer: Self-pay | Admitting: *Deleted

## 2017-03-07 NOTE — Telephone Encounter (Signed)
Spoke with patient's wife.  Requested manual Carelink transmission for review.  Patient is not home right now but she will have him send a transmission later today.

## 2017-03-08 NOTE — Telephone Encounter (Signed)
Manual transmission received.  Pause episodes from 9/30 and 10/4 are appropriate and nocturnal.  ECGs printed and placed in [facility] folder for review by Dr. Rayann Heman.

## 2017-03-23 ENCOUNTER — Ambulatory Visit (INDEPENDENT_AMBULATORY_CARE_PROVIDER_SITE_OTHER): Payer: Medicare Other | Admitting: *Deleted

## 2017-03-23 DIAGNOSIS — R55 Syncope and collapse: Secondary | ICD-10-CM

## 2017-03-23 NOTE — Progress Notes (Signed)
Carelink Summary Report / Loop Recorder 

## 2017-03-26 ENCOUNTER — Other Ambulatory Visit: Payer: Self-pay | Admitting: Cardiology

## 2017-03-29 LAB — CUP PACEART REMOTE DEVICE CHECK
MDC IDC PG IMPLANT DT: 20160307
MDC IDC SESS DTM: 20181024141242

## 2017-04-01 ENCOUNTER — Other Ambulatory Visit: Payer: Self-pay | Admitting: Internal Medicine

## 2017-04-05 ENCOUNTER — Other Ambulatory Visit: Payer: Self-pay | Admitting: Internal Medicine

## 2017-04-25 ENCOUNTER — Ambulatory Visit (INDEPENDENT_AMBULATORY_CARE_PROVIDER_SITE_OTHER): Payer: Medicare Other | Admitting: *Deleted

## 2017-04-25 DIAGNOSIS — R55 Syncope and collapse: Secondary | ICD-10-CM

## 2017-04-26 NOTE — Progress Notes (Signed)
Carelink Summary Report / Loop Recorder 

## 2017-05-05 LAB — CUP PACEART REMOTE DEVICE CHECK
Implantable Pulse Generator Implant Date: 20160307
MDC IDC SESS DTM: 20181123144203

## 2017-05-23 ENCOUNTER — Ambulatory Visit (INDEPENDENT_AMBULATORY_CARE_PROVIDER_SITE_OTHER): Payer: Medicare Other | Admitting: *Deleted

## 2017-05-23 DIAGNOSIS — R55 Syncope and collapse: Secondary | ICD-10-CM | POA: Diagnosis not present

## 2017-05-25 NOTE — Progress Notes (Signed)
Carelink Summary Report / Loop Recorder 

## 2017-06-03 ENCOUNTER — Ambulatory Visit: Payer: Medicare Other | Admitting: Cardiology

## 2017-06-03 LAB — CUP PACEART REMOTE DEVICE CHECK
Date Time Interrogation Session: 20181223143841
Implantable Pulse Generator Implant Date: 20160307

## 2017-06-03 NOTE — Progress Notes (Deleted)
Cardiology Office Note  Date: 06/03/2017   ID: HAZEM KENNER, DOB Dec 04, 1932, MRN [id number]  PCP: Glenda Chroman, MD  Primary Cardiologist: Rozann Lesches, MD   No chief complaint on file.   History of Present Illness: Alejandro [name] is an 84 y.o. male last seen in September 2018.  Labwork from September is reviewed below. Amiodarone was cut back to 100 mg daily around that time.  Most recent interrogation of patient's ILR by Dr. Rayann Heman demonstrated normal device function with no new bradycardic or tachycardic episodes, 0.2% AF burden.  Past Medical History:  Diagnosis Date  . CAD (coronary artery disease)    Moderate multivessel March 2016  . Essential hypertension   . Memory loss   . NSVT (nonsustained ventricular tachycardia) ([name])    Negative EP study  . Paroxysmal atrial fibrillation ([name])   . Secondary cardiomyopathy ([place])    LVEF 40-45% January 2014  . Syncope   . TIA (transient ischemic attack)     Past Surgical History:  Procedure Laterality Date  . CARDIOVERSION N/A 10/19/2016   Procedure: CARDIOVERSION;  Surgeon: [name], MD;  [facility];  Service: Cardiovascular;  Laterality: N/A;  . CATARACT EXTRACTION    . COLONOSCOPY  unknown  . COLONOSCOPY N/A 09/05/2014   Procedure: COLONOSCOPY;  Surgeon: [name], MD;  [facility];  Service: Endoscopy;  Laterality: N/A;  145pm  . ELECTROPHYSIOLOGY STUDY N/A 08/05/2014   Procedure: ELECTROPHYSIOLOGY STUDY;  Surgeon: Deboraha Sprang, MD;  Location: [facility] CATH LAB;  Service: Cardiovascular;  Laterality: N/A;  . LAPAROSCOPIC APPENDECTOMY  November 2015   Dr. [name]  . LEFT HEART CATHETERIZATION WITH CORONARY ANGIOGRAM N/A 07/31/2014   Procedure: LEFT HEART CATHETERIZATION WITH CORONARY ANGIOGRAM;  Surgeon: [name], MD;  Location: [facility] CATH LAB;  Service: Cardiovascular;  Laterality: N/A;  . LOOP RECORDER IMPLANT      Current Outpatient Medications  Medication Sig Dispense  Refill  . acetaminophen (TYLENOL) 500 MG tablet Take 1,000-1,500 mg by mouth every 6 (six) hours as needed for mild pain or moderate pain.    Marland Kitchen amiodarone (PACERONE) 100 MG tablet Take 1 tablet (100 mg total) by mouth daily. 30 tablet 3  . furosemide (LASIX) 20 MG tablet Take 1 tablet (20 mg total) by mouth as needed (WEIGHT GAIN OF 2LBS OR MORE IN 24 HOURS). 30 tablet 3  . midodrine (PROAMATINE) 5 MG tablet Take 1 tablet (5 mg total) by mouth 3 (three) times daily with meals. 90 tablet 3  . Multiple Vitamins-Minerals (MULTIVITAMIN MEN 50+ PO) Take 1 tablet by mouth daily.    Alveda Reasons 15 MG TABS tablet TAKE 1 TABLET BY MOUTH DAILY WITH SUPPER. 30 tablet 6   No current facility-administered medications for this visit.    Allergies:  Patient has no known allergies.   Social History: The patient  reports that  has never smoked. he has never used smokeless tobacco. He reports that he does not drink alcohol or use drugs.   Family History: The patient's family history includes Alzheimer's disease in his mother; Arthritis in his father; Heart disease in his father.   ROS:  Please see the history of present illness. Otherwise, complete review of systems is positive for {NONE DEFAULTED:18576::"none"}.  All other systems are reviewed and negative.   Physical Exam: VS:  There were no vitals taken for this visit., BMI There is no height or weight on file to calculate BMI.  Wt Readings from Last 3 Encounters:  02/01/17 176 lb (79.8 kg)  11/02/16 171 lb (77.6 kg)  10/21/16 169 lb 1.5 oz (76.7 kg)    General: Patient appears comfortable at rest. HEENT: Conjunctiva and lids normal, oropharynx clear with moist mucosa. Neck: Supple, no elevated JVP or carotid bruits, no thyromegaly. Lungs: Clear to auscultation, nonlabored breathing at rest. Cardiac: Regular rate and rhythm, no S3 or significant systolic murmur, no pericardial rub. Abdomen: Soft, nontender, no hepatomegaly, bowel sounds present, no  guarding or rebound. Extremities: No pitting edema, distal pulses 2+. Skin: Warm and dry. Musculoskeletal: No kyphosis. Neuropsychiatric: Alert and oriented x3, affect grossly appropriate.  ECG: I personally reviewed the tracing from 11/02/2016 which showed sinus rhythm with fusion beats, poor R-wave progression, nonspecific T-wave changes.  Recent Labwork: 10/14/2016: B Natriuretic Peptide 794.0 10/15/2016: TSH <0.010 10/21/2016: BUN 30; Creatinine, Ser 1.31; Magnesium 2.0; Potassium 4.0; Sodium 136 11/03/2016: Hemoglobin 13.8; Platelets 286     Component Value Date/Time   CHOL 138 08/03/2014 0304   TRIG 73 08/03/2014 0304   HDL 33 (L) 08/03/2014 0304   CHOLHDL 4.2 08/03/2014 0304   VLDL 15 08/03/2014 0304   LDLCALC 90 08/03/2014 0304  September 2018: BUN 16, creatinine 1.2, potassium 4.5, AST 24, ALT 10, hemoglobin 13.2, platelets 258, TSH 7.48  Other Studies Reviewed Today:  Echocardiogram 10/15/2016: Study Conclusions  - Left ventricle: The cavity size was normal. Wall thickness was   increased in a pattern of mild LVH. Systolic function was mildly   to moderately reduced. The estimated ejection fraction was in the   range of 40% to 45%. Diffuse hypokinesis. There is hypokinesis of   the basal-midinferolateral myocardium. - Aortic valve: Mildly calcified annulus. Trileaflet; mildly   calcified leaflets. There was trivial regurgitation. - Mitral valve: There was mild regurgitation. - Left atrium: The atrium was at the upper limits of normal in   size. - Right atrium: The atrium was mildly dilated. - Tricuspid valve: There was trivial regurgitation. - Pulmonary arteries: PA peak pressure: 31 mm Hg (S). - Pericardium, extracardiac: There was no pericardial effusion.  Impressions:  - Mild LVH with LVEF approximately 40-45% in the setting of atrial   fibrillation. There is diffuse hypokinesis, most prominent in the   inferolateral wall. Indeterminate diastolic function.  Upper   normal left atrial chamber size. Mild mitral regurgitation.   Mildly sclerotic aortic valve with trivial aortic regurgitation.   Mildly dilated right atrium. Trivial tricuspid regurgitation with   PASP estimated 31 mmHg.  Assessment and Plan:    Current medicines were reviewed with the patient today.  No orders of the defined types were placed in this encounter.   Disposition:  Signed, Satira Sark, MD, [facility] 06/03/2017 8:23 AM    [place] at [place], [place], [place] [address] Phone: [phone]; Fax: [phone]

## 2017-06-13 DIAGNOSIS — I429 Cardiomyopathy, unspecified: Secondary | ICD-10-CM | POA: Diagnosis not present

## 2017-06-13 DIAGNOSIS — Z789 Other specified health status: Secondary | ICD-10-CM | POA: Diagnosis not present

## 2017-06-13 DIAGNOSIS — Z299 Encounter for prophylactic measures, unspecified: Secondary | ICD-10-CM | POA: Diagnosis not present

## 2017-06-13 DIAGNOSIS — I10 Essential (primary) hypertension: Secondary | ICD-10-CM | POA: Diagnosis not present

## 2017-06-13 DIAGNOSIS — Z6827 Body mass index (BMI) 27.0-27.9, adult: Secondary | ICD-10-CM | POA: Diagnosis not present

## 2017-06-13 DIAGNOSIS — Z2821 Immunization not carried out because of patient refusal: Secondary | ICD-10-CM | POA: Diagnosis not present

## 2017-06-13 DIAGNOSIS — Z79899 Other long term (current) drug therapy: Secondary | ICD-10-CM | POA: Diagnosis not present

## 2017-06-13 DIAGNOSIS — R5383 Other fatigue: Secondary | ICD-10-CM | POA: Diagnosis not present

## 2017-06-16 ENCOUNTER — Other Ambulatory Visit: Payer: Self-pay | Admitting: Cardiology

## 2017-06-21 ENCOUNTER — Ambulatory Visit (INDEPENDENT_AMBULATORY_CARE_PROVIDER_SITE_OTHER): Payer: Medicare Other | Admitting: *Deleted

## 2017-06-21 DIAGNOSIS — R55 Syncope and collapse: Secondary | ICD-10-CM

## 2017-06-22 NOTE — Progress Notes (Signed)
Carelink Summary Report / Loop Recorder 

## 2017-06-27 DIAGNOSIS — Z299 Encounter for prophylactic measures, unspecified: Secondary | ICD-10-CM | POA: Diagnosis not present

## 2017-06-27 DIAGNOSIS — Z6827 Body mass index (BMI) 27.0-27.9, adult: Secondary | ICD-10-CM | POA: Diagnosis not present

## 2017-06-27 DIAGNOSIS — Z789 Other specified health status: Secondary | ICD-10-CM | POA: Diagnosis not present

## 2017-06-27 DIAGNOSIS — J329 Chronic sinusitis, unspecified: Secondary | ICD-10-CM | POA: Diagnosis not present

## 2017-06-27 DIAGNOSIS — M25519 Pain in unspecified shoulder: Secondary | ICD-10-CM | POA: Diagnosis not present

## 2017-06-27 DIAGNOSIS — I639 Cerebral infarction, unspecified: Secondary | ICD-10-CM | POA: Diagnosis not present

## 2017-07-01 LAB — CUP PACEART REMOTE DEVICE CHECK
Implantable Pulse Generator Implant Date: 20160307
MDC IDC SESS DTM: 20190122151135

## 2017-07-08 ENCOUNTER — Telehealth: Payer: Self-pay

## 2017-07-08 NOTE — Telephone Encounter (Signed)
Wife contacted office to let Dr. Domenic Polite know that patient has been recently diagnosed with alzheimer's. Wife states patient does not know this due to a previous conversation where patient stated he would give up on life if this were the diagnosis. Wife wanted Dr. Domenic Polite to be aware.

## 2017-07-08 NOTE — Progress Notes (Signed)
Cardiology Office Note  Date: 07/11/2017   ID: Alejandro [name], DOB [birth year], MRN [id number]  PCP: Glenda Chroman, MD  Primary Cardiologist: Rozann Lesches, MD   Chief Complaint  Patient presents with  . PAF    History of Present Illness: Alejandro [name] is an 84 y.o. male last seen in September 2018.  He presents with his wife for a follow-up visit.  He continues to report chronic dyspnea on exertion, has known orthostasis but no recent falls or syncope.  Reports compliance with his medications.  He has a loop recorder in place, followed by Dr. Rayann Heman.  Recent interrogation showed no tachycardia or bradycardia episodes, no new atrial fibrillation.  His wife let me know in private that he has been diagnosed with Alzheimer's dementia.  It does not sound like he is specifically aware of this diagnosis.  Current medications include low-dose amiodarone, midodrine, and Xarelto.  He had recent lab work per PCP.  Past Medical History:  Diagnosis Date  . CAD (coronary artery disease)    Moderate multivessel March 2016  . Essential hypertension   . Memory loss   . NSVT (nonsustained ventricular tachycardia) ([name])    Negative EP study  . Paroxysmal atrial fibrillation ([name])   . Secondary cardiomyopathy ([place])    LVEF 40-45% January 2014  . Syncope   . TIA (transient ischemic attack)     Past Surgical History:  Procedure Laterality Date  . CARDIOVERSION N/A 10/19/2016   Procedure: CARDIOVERSION;  Surgeon: [name], MD;  [facility];  Service: Cardiovascular;  Laterality: N/A;  . CATARACT EXTRACTION    . COLONOSCOPY  unknown  . COLONOSCOPY N/A 09/05/2014   Procedure: COLONOSCOPY;  Surgeon: [name], MD;  [facility];  Service: Endoscopy;  Laterality: N/A;  145pm  . ELECTROPHYSIOLOGY STUDY N/A 08/05/2014   Procedure: ELECTROPHYSIOLOGY STUDY;  Surgeon: Deboraha Sprang, MD;  Location: [facility] CATH LAB;  Service: Cardiovascular;  Laterality: N/A;  .  LAPAROSCOPIC APPENDECTOMY  November 2015   Dr. [name]  . LEFT HEART CATHETERIZATION WITH CORONARY ANGIOGRAM N/A 07/31/2014   Procedure: LEFT HEART CATHETERIZATION WITH CORONARY ANGIOGRAM;  Surgeon: [name], MD;  Location: [place] [facility] CATH LAB;  Service: Cardiovascular;  Laterality: N/A;  . LOOP RECORDER IMPLANT      Current Outpatient Medications  Medication Sig Dispense Refill  . acetaminophen (TYLENOL) 500 MG tablet Take 1,000-1,500 mg by mouth every 6 (six) hours as needed for mild pain or moderate pain.    Marland Kitchen amiodarone (PACERONE) 200 MG tablet TAKE (1/2) TABLET BY MOUTH DAILY. 15 tablet 1  . furosemide (LASIX) 20 MG tablet Take 1 tablet (20 mg total) by mouth as needed (WEIGHT GAIN OF 2LBS OR MORE IN 24 HOURS). 30 tablet 3  . levothyroxine (SYNTHROID, LEVOTHROID) 25 MCG tablet Take 25 mcg by mouth daily before breakfast.    . midodrine (PROAMATINE) 5 MG tablet Take 1 tablet (5 mg total) by mouth 3 (three) times daily with meals. 90 tablet 3  . Multiple Vitamins-Minerals (MULTIVITAMIN MEN 50+ PO) Take 1 tablet by mouth daily.    Alveda Reasons 15 MG TABS tablet TAKE 1 TABLET BY MOUTH DAILY WITH SUPPER. 30 tablet 6   No current facility-administered medications for this visit.    Allergies:  Patient has no known allergies.   Social History: The patient  reports that  has never smoked. he has never used smokeless tobacco. He reports that he does not drink  alcohol or use drugs.   ROS:  Please see the history of present illness. Otherwise, complete review of systems is positive for memory loss, hearing loss.  All other systems are reviewed and negative.   Physical Exam: VS:  BP 94/60   Pulse 78   Ht 5\' 8"  (1.727 m)   Wt 172 lb (78 kg)   SpO2 90%   BMI 26.15 kg/m , BMI Body mass index is 26.15 kg/m.  Wt Readings from Last 3 Encounters:  07/11/17 172 lb (78 kg)  02/01/17 176 lb (79.8 kg)  11/02/16 171 lb (77.6 kg)    General: Elderly male, appears comfortable at rest. HEENT:  Conjunctiva and lids normal, oropharynx clear. Neck: Supple, no elevated JVP or carotid bruits, no thyromegaly. Lungs: Clear to auscultation, nonlabored breathing at rest. Cardiac: Regular rate and rhythm with occasional ectopic beats, soft systolic murmur, no pericardial rub. Abdomen: Soft, nontender, bowel sounds present. Extremities: No pitting edema, distal pulses 2+. Skin: Warm and dry. Musculoskeletal: No kyphosis. Neuropsychiatric: Alert and oriented x3, affect grossly appropriate.  ECG: I personally reviewed the tracing from 11/02/2016 which showed sinus rhythm with fusion beats, leftward axis, poor R wave progression, nonspecific T wave changes.  Recent Labwork: 10/14/2016: B Natriuretic Peptide 794.0 10/15/2016: TSH <0.010 10/21/2016: BUN 30; Creatinine, Ser 1.31; Magnesium 2.0; Potassium 4.0; Sodium 136 11/03/2016: Hemoglobin 13.8; Platelets 286     Component Value Date/Time   CHOL 138 08/03/2014 0304   TRIG 73 08/03/2014 0304   HDL 33 (L) 08/03/2014 0304   CHOLHDL 4.2 08/03/2014 0304   VLDL 15 08/03/2014 0304   LDLCALC 90 08/03/2014 0304  September 2018: BUN 16, creatinine 1.2, AST 24, ALT 10, hemoglobin 13.2, platelets 258, TSH 7.48  Other Studies Reviewed Today:  Echocardiogram 10/15/2016: Study Conclusions  - Left ventricle: The cavity size was normal. Wall thickness was   increased in a pattern of mild LVH. Systolic function was mildly   to moderately reduced. The estimated ejection fraction was in the   range of 40% to 45%. Diffuse hypokinesis. There is hypokinesis of   the basal-midinferolateral myocardium. - Aortic valve: Mildly calcified annulus. Trileaflet; mildly   calcified leaflets. There was trivial regurgitation. - Mitral valve: There was mild regurgitation. - Left atrium: The atrium was at the upper limits of normal in   size. - Right atrium: The atrium was mildly dilated. - Tricuspid valve: There was trivial regurgitation. - Pulmonary arteries: PA peak  pressure: 31 mm Hg (S). - Pericardium, extracardiac: There was no pericardial effusion.  Impressions:  - Mild LVH with LVEF approximately 40-45% in the setting of atrial   fibrillation. There is diffuse hypokinesis, most prominent in the   inferolateral wall. Indeterminate diastolic function. Upper   normal left atrial chamber size. Mild mitral regurgitation.   Mildly sclerotic aortic valve with trivial aortic regurgitation.   Mildly dilated right atrium. Trivial tricuspid regurgitation with   PASP estimated 31 mmHg.  Assessment and Plan:  1.  Nonischemic cardiomyopathy with LVEF 40-45%.  He has had medication intolerances limiting regimen.  Lasix is used very infrequently and his weight is actually down at present.  2.  Orthostatic hypertension.  He continues on Proamatine.  3.  History of syncope without recurrence recently.  Loop recorder interrogation noted.  4.  Paroxysmal atrial fibrillation.  He continues on low-dose amiodarone as well as the requesting lab work from [facility] internal medicine.  Current medicines were reviewed with the patient today.   Disposition: Follow-up in  6 months.  Signed, Satira Sark, MD, [facility] 07/11/2017 12:09 PM    [place] [facility] [place], [place], [place] [address] Phone: [phone]; Fax: [phone]

## 2017-07-11 ENCOUNTER — Encounter: Payer: Self-pay | Admitting: Cardiology

## 2017-07-11 ENCOUNTER — Ambulatory Visit (INDEPENDENT_AMBULATORY_CARE_PROVIDER_SITE_OTHER): Payer: Medicare Other | Admitting: Cardiology

## 2017-07-11 VITALS — BP 94/60 | HR 78 | Ht 68.0 in | Wt 172.0 lb

## 2017-07-11 DIAGNOSIS — I480 Paroxysmal atrial fibrillation: Secondary | ICD-10-CM | POA: Diagnosis not present

## 2017-07-11 DIAGNOSIS — I951 Orthostatic hypotension: Secondary | ICD-10-CM | POA: Diagnosis not present

## 2017-07-11 DIAGNOSIS — Z87898 Personal history of other specified conditions: Secondary | ICD-10-CM

## 2017-07-11 DIAGNOSIS — I428 Other cardiomyopathies: Secondary | ICD-10-CM

## 2017-07-11 NOTE — Patient Instructions (Signed)

## 2017-07-21 DIAGNOSIS — I509 Heart failure, unspecified: Secondary | ICD-10-CM | POA: Diagnosis not present

## 2017-07-21 DIAGNOSIS — Z6827 Body mass index (BMI) 27.0-27.9, adult: Secondary | ICD-10-CM | POA: Diagnosis not present

## 2017-07-21 DIAGNOSIS — Z1331 Encounter for screening for depression: Secondary | ICD-10-CM | POA: Diagnosis not present

## 2017-07-21 DIAGNOSIS — Z125 Encounter for screening for malignant neoplasm of prostate: Secondary | ICD-10-CM | POA: Diagnosis not present

## 2017-07-21 DIAGNOSIS — N400 Enlarged prostate without lower urinary tract symptoms: Secondary | ICD-10-CM | POA: Diagnosis not present

## 2017-07-21 DIAGNOSIS — Z79899 Other long term (current) drug therapy: Secondary | ICD-10-CM | POA: Diagnosis not present

## 2017-07-21 DIAGNOSIS — Z0000 Encounter for general adult medical examination without abnormal findings: Secondary | ICD-10-CM | POA: Diagnosis not present

## 2017-07-21 DIAGNOSIS — E039 Hypothyroidism, unspecified: Secondary | ICD-10-CM | POA: Diagnosis not present

## 2017-07-21 DIAGNOSIS — I10 Essential (primary) hypertension: Secondary | ICD-10-CM | POA: Diagnosis not present

## 2017-07-21 DIAGNOSIS — Z299 Encounter for prophylactic measures, unspecified: Secondary | ICD-10-CM | POA: Diagnosis not present

## 2017-07-21 DIAGNOSIS — Z2821 Immunization not carried out because of patient refusal: Secondary | ICD-10-CM | POA: Diagnosis not present

## 2017-07-21 DIAGNOSIS — Z7189 Other specified counseling: Secondary | ICD-10-CM | POA: Diagnosis not present

## 2017-07-21 DIAGNOSIS — Z1339 Encounter for screening examination for other mental health and behavioral disorders: Secondary | ICD-10-CM | POA: Diagnosis not present

## 2017-07-21 DIAGNOSIS — I429 Cardiomyopathy, unspecified: Secondary | ICD-10-CM | POA: Diagnosis not present

## 2017-07-25 ENCOUNTER — Ambulatory Visit (INDEPENDENT_AMBULATORY_CARE_PROVIDER_SITE_OTHER): Payer: Medicare Other | Admitting: *Deleted

## 2017-07-25 DIAGNOSIS — R55 Syncope and collapse: Secondary | ICD-10-CM | POA: Diagnosis not present

## 2017-07-25 NOTE — Progress Notes (Signed)
Carelink Summary Report / Loop Recorder 

## 2017-08-13 ENCOUNTER — Other Ambulatory Visit: Payer: Self-pay | Admitting: Cardiology

## 2017-08-25 DIAGNOSIS — H35342 Macular cyst, hole, or pseudohole, left eye: Secondary | ICD-10-CM | POA: Diagnosis not present

## 2017-08-25 DIAGNOSIS — H169 Unspecified keratitis: Secondary | ICD-10-CM | POA: Diagnosis not present

## 2017-08-25 DIAGNOSIS — H04123 Dry eye syndrome of bilateral lacrimal glands: Secondary | ICD-10-CM | POA: Diagnosis not present

## 2017-08-25 DIAGNOSIS — H35373 Puckering of macula, bilateral: Secondary | ICD-10-CM | POA: Diagnosis not present

## 2017-08-25 DIAGNOSIS — Z961 Presence of intraocular lens: Secondary | ICD-10-CM | POA: Diagnosis not present

## 2017-08-26 ENCOUNTER — Ambulatory Visit (INDEPENDENT_AMBULATORY_CARE_PROVIDER_SITE_OTHER): Payer: Medicare Other | Admitting: *Deleted

## 2017-08-26 DIAGNOSIS — R55 Syncope and collapse: Secondary | ICD-10-CM

## 2017-08-29 LAB — CUP PACEART REMOTE DEVICE CHECK
Date Time Interrogation Session: 20190224184122
Implantable Pulse Generator Implant Date: 20160307

## 2017-08-29 NOTE — Progress Notes (Signed)
Carelink Summary Report / Loop Recorder 

## 2017-08-31 ENCOUNTER — Other Ambulatory Visit: Payer: Self-pay | Admitting: Cardiology

## 2017-09-28 ENCOUNTER — Ambulatory Visit (INDEPENDENT_AMBULATORY_CARE_PROVIDER_SITE_OTHER): Payer: Medicare Other | Admitting: *Deleted

## 2017-09-28 DIAGNOSIS — R55 Syncope and collapse: Secondary | ICD-10-CM

## 2017-09-29 NOTE — Progress Notes (Signed)
Carelink Summary Report / Loop Recorder 

## 2017-09-30 LAB — CUP PACEART REMOTE DEVICE CHECK
Implantable Pulse Generator Implant Date: 20160307
MDC IDC SESS DTM: 20190329193910

## 2017-10-19 DIAGNOSIS — Z299 Encounter for prophylactic measures, unspecified: Secondary | ICD-10-CM | POA: Diagnosis not present

## 2017-10-19 DIAGNOSIS — Z6827 Body mass index (BMI) 27.0-27.9, adult: Secondary | ICD-10-CM | POA: Diagnosis not present

## 2017-10-19 DIAGNOSIS — I10 Essential (primary) hypertension: Secondary | ICD-10-CM | POA: Diagnosis not present

## 2017-10-19 DIAGNOSIS — I509 Heart failure, unspecified: Secondary | ICD-10-CM | POA: Diagnosis not present

## 2017-10-19 DIAGNOSIS — I429 Cardiomyopathy, unspecified: Secondary | ICD-10-CM | POA: Diagnosis not present

## 2017-10-24 LAB — CUP PACEART REMOTE DEVICE CHECK
Date Time Interrogation Session: 20190501193737
Implantable Pulse Generator Implant Date: 20160307

## 2017-10-27 ENCOUNTER — Telehealth: Payer: Self-pay | Admitting: *Deleted

## 2017-10-27 NOTE — Telephone Encounter (Signed)
LMOM requesting call back to the [facility].  Gave direct number for return call.  LINQ at [facility]RT as of 10/14/17.  Will discuss options.  Also need a manual transmission for review of "tachy" episodes that have not transmitted automatically.

## 2017-10-27 NOTE — Telephone Encounter (Signed)
Spoke with Alejandro [name].  She is requesting to see if appointment can be made in the [place] office as soon as possible.  She is requesting to be called on her cell phone. I made an appointment for August but she wants to come sooner than that.

## 2017-10-27 NOTE — Telephone Encounter (Signed)
Patient's wife returned call.  She wishes to schedule an appointment to discuss plan regarding RRT with Dr. Rayann Heman, preferably in [place].  Patient's wife is aware that they will receive a call from a scheduler.  Mrs. Swaim agrees to send a manual transmission for review of episodes.  Transmission received, 7 "tachy" episodes all from 10/26/17 between 0905-1057 and appear AF w/RVR, longest 73min 15sec.  AF burden 1.8%.  ECGs printed and placed Dr. Otilio Connors folder for review.

## 2017-10-31 ENCOUNTER — Ambulatory Visit (INDEPENDENT_AMBULATORY_CARE_PROVIDER_SITE_OTHER): Payer: Medicare Other | Admitting: *Deleted

## 2017-10-31 DIAGNOSIS — I480 Paroxysmal atrial fibrillation: Secondary | ICD-10-CM | POA: Diagnosis not present

## 2017-10-31 NOTE — Progress Notes (Signed)
Carelink Summary Report / Loop Recorder 

## 2017-11-01 ENCOUNTER — Other Ambulatory Visit: Payer: Self-pay | Admitting: Internal Medicine

## 2017-11-03 NOTE — Telephone Encounter (Signed)
Patient scheduled with Dr. Rayann Heman on 11/09/17.

## 2017-11-09 ENCOUNTER — Ambulatory Visit (INDEPENDENT_AMBULATORY_CARE_PROVIDER_SITE_OTHER): Payer: Medicare Other | Admitting: Internal Medicine

## 2017-11-09 ENCOUNTER — Encounter: Payer: Self-pay | Admitting: Internal Medicine

## 2017-11-09 VITALS — BP 126/64 | HR 53 | Ht 68.0 in | Wt 182.0 lb

## 2017-11-09 DIAGNOSIS — I480 Paroxysmal atrial fibrillation: Secondary | ICD-10-CM

## 2017-11-09 DIAGNOSIS — R55 Syncope and collapse: Secondary | ICD-10-CM

## 2017-11-09 DIAGNOSIS — I10 Essential (primary) hypertension: Secondary | ICD-10-CM

## 2017-11-09 HISTORY — PX: LOOP RECORDER REMOVAL: EP1215

## 2017-11-09 NOTE — Patient Instructions (Addendum)
Medication Instructions:  Your physician recommends that you continue on your current medications as directed. Please refer to the Current Medication list given to you today.   Labwork: None ordered  Testing/Procedures: None ordered  Follow-Up: Your physician recommends that you schedule a follow-up appointment in: 7-10 days in the device clinic for a wound check  Your physician recommends that you follow-up with Dr. Rayann Heman AS NEEDED    Any Other Special Instructions Will Be Listed Below (If Applicable).  ---YOU MAY SHOWER AFTER 24 HOURS---  ---YOU WILL BE SENT THE PACKAGE IN THE MAIL THAT WILL ARRIVE IN 6-8 WEEKS FOR YOUR TO RETURN YOUR EQUIPMENT--  If you need a refill on your cardiac medications before your next appointment, please call your pharmacy.

## 2017-11-09 NOTE — Progress Notes (Signed)
PCP: Glenda Chroman, MD Primary Cardiologist: Dr Domenic Polite Primary EP: Dr Rayann Heman  Alejandro [name] is a 84 y.o. male who presents today for routine electrophysiology followup.  His ILR has reached RRT and he is here for discussions. Since last being seen in our clinic, the patient reports doing very well. + chronic SOB, + orthostasis.  He has Alzheimers. Today, he denies symptoms of palpitations, chest pain,   lower extremity edema, dizziness, presyncope, or syncope.  The patient is otherwise without complaint today.   Past Medical History:  Diagnosis Date  . CAD (coronary artery disease)    Moderate multivessel March 2016  . Essential hypertension   . Memory loss   . NSVT (nonsustained ventricular tachycardia) ([name])    Negative EP study  . Paroxysmal atrial fibrillation ([name])   . Secondary cardiomyopathy ([place])    LVEF 40-45% January 2014  . Syncope   . TIA (transient ischemic attack)    Past Surgical History:  Procedure Laterality Date  . CARDIOVERSION N/A 10/19/2016   Procedure: CARDIOVERSION;  Surgeon: [name], MD;  [facility];  Service: Cardiovascular;  Laterality: N/A;  . CATARACT EXTRACTION    . COLONOSCOPY  unknown  . COLONOSCOPY N/A 09/05/2014   Procedure: COLONOSCOPY;  Surgeon: [name], MD;  [facility];  Service: Endoscopy;  Laterality: N/A;  145pm  . ELECTROPHYSIOLOGY STUDY N/A 08/05/2014   Procedure: ELECTROPHYSIOLOGY STUDY;  Surgeon: Deboraha Sprang, MD;  Location: [facility] CATH LAB;  Service: Cardiovascular;  Laterality: N/A;  . LAPAROSCOPIC APPENDECTOMY  November 2015   Dr. [name]  . LEFT HEART CATHETERIZATION WITH CORONARY ANGIOGRAM N/A 07/31/2014   Procedure: LEFT HEART CATHETERIZATION WITH CORONARY ANGIOGRAM;  Surgeon: [name], MD;  Location: [place] CATH LAB;  Service: Cardiovascular;  Laterality: N/A;  . LOOP RECORDER IMPLANT      ROS- all systems are reviewed and negatives except as per HPI above  Current Outpatient  Medications  Medication Sig Dispense Refill  . acetaminophen (TYLENOL) 500 MG tablet Take 1,000-1,500 mg by mouth every 6 (six) hours as needed for mild pain or moderate pain.    Marland Kitchen amiodarone (PACERONE) 200 MG tablet TAKE (1/2) TABLET BY MOUTH DAILY. 15 tablet 6  . furosemide (LASIX) 20 MG tablet Take 1 tablet (20 mg total) by mouth as needed (WEIGHT GAIN OF 2LBS OR MORE IN 24 HOURS). 30 tablet 3  . levothyroxine (SYNTHROID, LEVOTHROID) 25 MCG tablet Take 25 mcg by mouth daily before breakfast.    . midodrine (PROAMATINE) 5 MG tablet TAKE 1 TABLET BY MOUTH 3 TIMES DAILY WITH MEALS. 90 tablet 6  . Multiple Vitamins-Minerals (MULTIVITAMIN MEN 50+ PO) Take 1 tablet by mouth daily.    Alveda Reasons 15 MG TABS tablet TAKE 1 TABLET BY MOUTH DAILY WITH SUPPER. 30 tablet 6   No current facility-administered medications for this visit.     Physical Exam: Vitals:   11/09/17 1458  BP: 126/64  Pulse: (!) 53  SpO2: 90%  Weight: 182 lb (82.6 kg)  Height: 5\' 8"  (1.727 m)    GEN- The patient is well appearing, alert and oriented x 3 today.   Head- normocephalic, atraumatic Eyes-  Sclera clear, conjunctiva pink Ears- hearing intact Oropharynx- clear Lungs- Clear to ausculation bilaterally, normal work of breathing Heart- Regular rate and rhythm, no murmurs, rubs or gallops, PMI not laterally displaced GI- soft, NT, ND, + BS Extremities- no clubbing, cyanosis, or edema  Wt Readings from Last  3 Encounters:  11/09/17 182 lb (82.6 kg)  07/11/17 172 lb (78 kg)  02/01/17 176 lb (79.8 kg)    EKG tracing ordered today is personally reviewed and shows sinus rhythm 53 bpm, PR 262 msec, QRS 112 msec, Qtc 427 msec  Assessment and Plan:  1. Syncope No recurrence s/p ILR Negative prior EPS ILR is at end of battery service.  Options of leaving the device, replacing the device, and removal of the device were discussed at length with the patient today.  He is very clear that he would like to have his ILR  removed.  Risks and benefits of the procedure were discussed with the patient and his wife who wish to proceed at this time.  2. afib Controlled (AF burden by ILR is 2.4%) On xarelto  3. HTN Stable No change required today  4. Ischemic CM/ CAD/ chronic systolic dysfunction euvolemic No ischemic symptoms  Thompson Grayer MD, [facility] 11/09/2017 3:37 PM    Addendum:   PROCEDURES:   1. Implantable loop recorder explantation       DESCRIPTION OF PROCEDURE:  Informed written consent was obtained.  The patient required no sedation for the procedure today.   The patients left chest was therefore prepped and draped in the usual sterile fashion.  The skin overlying the ILR monitor was infiltrated with lidocaine for local analgesia.  A 0.5-cm incision was made over the site.  The previously implanted ILR was exposed and removed using a combination of sharp and blunt dissection.  Steri- Strips and a sterile dressing were then applied. EBL<55ml.  There were no early apparent complications.     CONCLUSIONS:   1. Successful explantation of a [employer] Reveal LINQ implantable loop recorder, previously implanted for syncope  2. No early apparent complications.        Thompson Grayer MD, [facility] 11/09/2017 4:38 PM

## 2017-11-15 LAB — CUP PACEART INCLINIC DEVICE CHECK
Implantable Pulse Generator Implant Date: 20160307
MDC IDC SESS DTM: 20190612195236

## 2017-11-18 DIAGNOSIS — Z299 Encounter for prophylactic measures, unspecified: Secondary | ICD-10-CM | POA: Diagnosis not present

## 2017-11-18 DIAGNOSIS — Z6828 Body mass index (BMI) 28.0-28.9, adult: Secondary | ICD-10-CM | POA: Diagnosis not present

## 2017-11-18 DIAGNOSIS — I10 Essential (primary) hypertension: Secondary | ICD-10-CM | POA: Diagnosis not present

## 2017-11-18 DIAGNOSIS — M25512 Pain in left shoulder: Secondary | ICD-10-CM | POA: Diagnosis not present

## 2017-11-24 ENCOUNTER — Ambulatory Visit (INDEPENDENT_AMBULATORY_CARE_PROVIDER_SITE_OTHER): Payer: Medicare Other | Admitting: *Deleted

## 2017-11-24 DIAGNOSIS — I639 Cerebral infarction, unspecified: Secondary | ICD-10-CM

## 2017-11-24 NOTE — Progress Notes (Signed)
Wound check appointment s/p LINQ explant. Steri-strips removed prior to appointment. Wound without redness or edema. Incision edges approximated, wound well healed. ROV with JA PRN.

## 2017-11-28 DIAGNOSIS — Z299 Encounter for prophylactic measures, unspecified: Secondary | ICD-10-CM | POA: Diagnosis not present

## 2017-11-28 DIAGNOSIS — I700 Atherosclerosis of aorta: Secondary | ICD-10-CM | POA: Diagnosis not present

## 2017-11-28 DIAGNOSIS — I639 Cerebral infarction, unspecified: Secondary | ICD-10-CM | POA: Diagnosis not present

## 2017-11-28 DIAGNOSIS — J189 Pneumonia, unspecified organism: Secondary | ICD-10-CM | POA: Diagnosis not present

## 2017-11-28 DIAGNOSIS — J8410 Pulmonary fibrosis, unspecified: Secondary | ICD-10-CM | POA: Diagnosis not present

## 2017-11-28 DIAGNOSIS — I429 Cardiomyopathy, unspecified: Secondary | ICD-10-CM | POA: Diagnosis not present

## 2017-11-28 DIAGNOSIS — Z789 Other specified health status: Secondary | ICD-10-CM | POA: Diagnosis not present

## 2017-11-28 DIAGNOSIS — R0602 Shortness of breath: Secondary | ICD-10-CM | POA: Diagnosis not present

## 2017-11-28 DIAGNOSIS — R0989 Other specified symptoms and signs involving the circulatory and respiratory systems: Secondary | ICD-10-CM | POA: Diagnosis not present

## 2017-11-28 DIAGNOSIS — R05 Cough: Secondary | ICD-10-CM | POA: Diagnosis not present

## 2017-11-28 DIAGNOSIS — Z6827 Body mass index (BMI) 27.0-27.9, adult: Secondary | ICD-10-CM | POA: Diagnosis not present

## 2017-12-07 LAB — CUP PACEART REMOTE DEVICE CHECK
MDC IDC PG IMPLANT DT: 20160307
MDC IDC SESS DTM: 20190603200730

## 2017-12-09 ENCOUNTER — Emergency Department (HOSPITAL_COMMUNITY)
Admission: EM | Admit: 2017-12-09 | Discharge: 2017-12-09 | Disposition: A | Discharge status: Home / Self Care | Payer: Medicare Other | Attending: Emergency Medicine | Admitting: Emergency Medicine

## 2017-12-09 ENCOUNTER — Other Ambulatory Visit: Payer: Self-pay

## 2017-12-09 ENCOUNTER — Encounter: Payer: Self-pay | Admitting: Physician Assistant

## 2017-12-09 DIAGNOSIS — G309 Alzheimer's disease, unspecified: Secondary | ICD-10-CM | POA: Insufficient documentation

## 2017-12-09 DIAGNOSIS — F0280 Dementia in other diseases classified elsewhere without behavioral disturbance: Secondary | ICD-10-CM | POA: Diagnosis not present

## 2017-12-09 DIAGNOSIS — I129 Hypertensive chronic kidney disease with stage 1 through stage 4 chronic kidney disease, or unspecified chronic kidney disease: Secondary | ICD-10-CM | POA: Insufficient documentation

## 2017-12-09 DIAGNOSIS — N183 Chronic kidney disease, stage 3 (moderate): Secondary | ICD-10-CM | POA: Diagnosis not present

## 2017-12-09 DIAGNOSIS — R21 Rash and other nonspecific skin eruption: Secondary | ICD-10-CM | POA: Diagnosis present

## 2017-12-09 DIAGNOSIS — Z79899 Other long term (current) drug therapy: Secondary | ICD-10-CM | POA: Insufficient documentation

## 2017-12-09 DIAGNOSIS — L259 Unspecified contact dermatitis, unspecified cause: Secondary | ICD-10-CM

## 2017-12-09 DIAGNOSIS — I2510 Atherosclerotic heart disease of native coronary artery without angina pectoris: Secondary | ICD-10-CM | POA: Insufficient documentation

## 2017-12-09 DIAGNOSIS — F0390 Unspecified dementia without behavioral disturbance: Secondary | ICD-10-CM | POA: Diagnosis not present

## 2017-12-09 DIAGNOSIS — Z8673 Personal history of transient ischemic attack (TIA), and cerebral infarction without residual deficits: Secondary | ICD-10-CM | POA: Insufficient documentation

## 2017-12-09 HISTORY — DX: Dementia in other diseases classified elsewhere, unspecified severity, without behavioral disturbance, psychotic disturbance, mood disturbance, and anxiety: F02.80

## 2017-12-09 HISTORY — DX: Chronic kidney disease, stage 3 unspecified: N18.30

## 2017-12-09 HISTORY — DX: Chronic kidney disease, stage 3 (moderate): N18.3

## 2017-12-09 HISTORY — DX: Alzheimer's disease, unspecified: G30.9

## 2017-12-09 HISTORY — DX: Orthostatic hypotension: I95.1

## 2017-12-09 MED ORDER — PREDNISONE 20 MG PO TABS: 40.0000 mg | ORAL_TABLET | Freq: Every day | ORAL | 0 refills | Status: DC

## 2017-12-09 MED ORDER — PREDNISONE 50 MG PO TABS
60.0000 mg | ORAL_TABLET | Freq: Once | ORAL | Status: AC
  Administered 2017-12-09: 60 mg via ORAL
  Filled 2017-12-09: qty 1

## 2017-12-09 NOTE — Discharge Instructions (Signed)
Take the prescription as directed.  Take over the counter benadryl, as directed on packaging, as needed for itching.  If the benadryl is too sedating, take an over the counter non-sedating antihistamine such as claritin, allegra or zyrtec, as directed on packaging.  Call your regular medical doctor on Monday to schedule a follow up appointment within the next 3 days.  Return to the Emergency Department immediately sooner if worsening.

## 2017-12-09 NOTE — Progress Notes (Deleted)
Cardiology Office Note    Date:  12/09/2017  ID:  Alejandro [name], DOB 08-Apr-1933, MRN [id number] PCP:  Glenda Chroman, MD  Cardiologist:  Rozann Lesches, MD  Chief Complaint: *** ?  History of Present Illness:  Alejandro [name] is a 84 y.o. male with history of Alzheimer's dementia, moderate CAD, cardiomyopathy, HTN, NSVT with negative EP study, PAF, CKD stage III, TIA, syncope, NICM EF 40-45%, orthostatic hypotension, chronic dyspnea on exertion who presents for evaluation of not feeling well.  Cardiac cath in 07/2014 showed aneurysmal right coronary artery with sluggish flow and possible chronic layered thrombus distally, borderline significant ostial left circumflex stenosis, otherwise no evidence of obstructive coronary artery disease. LVEF was 30-35% at that time. Medical therapy for coronary artery disease was recommended. The stenosis in the ostial left circumflex was not optimal for PCI due to heavy calcifications at the ostium of the LAD and having to jail a ramus branch. 2D Echo 09/2016 showed mild LVH, EF 40-45% with diffuse HK and HK of the basal mid-inferolateral myocardium, mild MR. Dr. Myles Gip notes have considered the cardiomyopathy non-ischemic and Dr. Jackalyn Lombard have called it ischemic. He's had a loop recorder in place for syncope, and has had diagnosis of atrial fib along the way and is on Xarelto and amiodarone. Last device check 6/12 showed 2.4% AF burden. Device was at [facility] at that time so it was removed. Labs 07/2017 showed Cr 1.36, Na 142, K 4.8, LFTS ok, WBC 13.2, Hgb 14.5, Plt 273, LDL 93, TSH wnl. With regards to his cardiomyopathy, he has had medication intolerances limiting regimen and Lasix is used very infrequently.  Cc? Any labs at pcp?  wife said alzheimers pfts eye exam lfts/TSh ok 07/2017 Check dose of anticoag  ***?cc Non-ischemic cardiomyopathy Orthostatic hypotension Paroxysmal atrial fibrillation CAD    Past Medical History:  Diagnosis Date  .  Alzheimer's dementia   . CAD (coronary artery disease)    a. ardiac cath in 07/2014 showed aneurysmal right coronary artery with sluggish flow and possible chronic layered thrombus distally, borderline significant ostial left circumflex stenosis, otherwise no evidence of obstructive coronary artery disease. Med rx recommended, EF 30-35% at that cath.  . Chronic shortness of breath   . CKD (chronic kidney disease), stage III ([place])   . Essential hypertension   . Memory loss   . NSVT (nonsustained ventricular tachycardia) ([name])    Negative EP study  . Orthostatic hypotension   . Paroxysmal atrial fibrillation ([name])   . Secondary cardiomyopathy ([place])    LVEF 40-45% January 2014  . Syncope   . TIA (transient ischemic attack)     Past Surgical History:  Procedure Laterality Date  . CARDIOVERSION N/A 10/19/2016   Procedure: CARDIOVERSION;  Surgeon: [name], MD;  [facility];  Service: Cardiovascular;  Laterality: N/A;  . CATARACT EXTRACTION    . COLONOSCOPY  unknown  . COLONOSCOPY N/A 09/05/2014   Procedure: COLONOSCOPY;  Surgeon: [name], MD;  [facility];  Service: Endoscopy;  Laterality: N/A;  145pm  . ELECTROPHYSIOLOGY STUDY N/A 08/05/2014   Procedure: ELECTROPHYSIOLOGY STUDY;  Surgeon: Deboraha Sprang, MD;  Location: [facility] CATH LAB;  Service: Cardiovascular;  Laterality: N/A;  . LAPAROSCOPIC APPENDECTOMY  November 2015   Dr. [name]  . LEFT HEART CATHETERIZATION WITH CORONARY ANGIOGRAM N/A 07/31/2014   Procedure: LEFT HEART CATHETERIZATION WITH CORONARY ANGIOGRAM;  Surgeon: [name], MD;  Location: [place] CATH LAB;  Service: Cardiovascular;  Laterality: N/A;  . LOOP RECORDER IMPLANT    . LOOP RECORDER REMOVAL  11/09/2017   MDT LINQ loop recorder removed for end of battery life of the device by Dr Rayann Heman    Current Medications: No outpatient medications have been marked as taking for the 12/13/17 encounter (Appointment) with Charlie Pitter, PA-C.    ***   Allergies:   Patient has no known allergies.   Social History   Socioeconomic History  . Marital status: Married    Spouse name: Not on file  . Number of children: Not on file  . Years of education: 20  . Highest education level: Not on file  Occupational History  . Occupation: Retired  Scientific laboratory technician  . Financial resource strain: Not on file  . Food insecurity:    Worry: Not on file    Inability: Not on file  . Transportation needs:    Medical: Not on file    Non-medical: Not on file  Tobacco Use  . Smoking status: Never Smoker  . Smokeless tobacco: Never Used  Substance and Sexual Activity  . Alcohol use: No    Alcohol/week: 0.0 oz  . Drug use: No  . Sexual activity: Not on file  Lifestyle  . Physical activity:    Days per week: Not on file    Minutes per session: Not on file  . Stress: Not on file  Relationships  . Social connections:    Talks on phone: Not on file    Gets together: Not on file    Attends religious service: Not on file    Active member of club or organization: Not on file    Attends meetings of clubs or organizations: Not on file    Relationship status: Not on file  Other Topics Concern  . Not on file  Social History Narrative   Caffeine: 2 drinks a day      Family History:  The patient's ***family history includes Alzheimer's disease in his mother; Arthritis in his father; Heart disease in his father.  ROS:   Please see the history of present illness. Otherwise, review of systems is positive for ***.  All other systems are reviewed and otherwise negative.    PHYSICAL EXAM:   VS:  There were no vitals taken for this visit.  BMI: There is no height or weight on file to calculate BMI. GEN: Well nourished, well developed, in no acute distress HEENT: normocephalic, atraumatic Neck: no JVD, carotid bruits, or masses Cardiac: ***RRR; no murmurs, rubs, or gallops, no edema  Respiratory:  clear to auscultation bilaterally, normal work  of breathing GI: soft, nontender, nondistended, + BS MS: no deformity or atrophy Skin: warm and dry, no rash Neuro:  Alert and Oriented x 3, Strength and sensation are intact, follows commands Psych: euthymic mood, full affect  Wt Readings from Last 3 Encounters:  11/09/17 182 lb (82.6 kg)  07/11/17 172 lb (78 kg)  02/01/17 176 lb (79.8 kg)      Studies/Labs Reviewed:   EKG:  EKG was ordered today and personally reviewed by me and demonstrates *** EKG was not ordered today.***  Recent Labs: No results found for requested labs within last 8760 hours.   Lipid Panel    Component Value Date/Time   CHOL 138 08/03/2014 0304   TRIG 73 08/03/2014 0304   HDL 33 (L) 08/03/2014 0304   CHOLHDL 4.2 08/03/2014 0304   VLDL 15 08/03/2014 0304   LDLCALC 90 08/03/2014 0304  Additional studies/ records that were reviewed today include: Summarized above.***    ASSESSMENT & PLAN:   1. ***  Disposition: F/u with ***   Medication Adjustments/Labs and Tests Ordered: Current medicines are reviewed at length with the patient today.  Concerns regarding medicines are outlined above. Medication changes, Labs and Tests ordered today are summarized above and listed in the Patient Instructions accessible in Encounters.   Signed, Charlie Pitter, PA-C  12/09/2017 1:09 PM    [place] Location in [place]. [place], [place] [address] Ph: [phone]; Fax [phone]

## 2017-12-09 NOTE — ED Provider Notes (Signed)
[facility] EMERGENCY DEPARTMENT Provider Note   CSN: [id number] Arrival date & time: 12/09/17  2038     History   Chief Complaint Chief Complaint  Patient presents with  . Rash    HPI Alejandro [name] is a 84 y.o. male.  The history is provided by the patient and the spouse. The history is limited by the condition of the patient (Hx dementia).  Rash     Pt was seen at 2145. Per pt and his wife, c/o gradual onset and persistence of constant "itching rash" to his anterior torso and bilat LE's for the past 1 week. Pt states the rash began "after I had an xray of my chest." Pt states he has been "putting alcohol on it." Pt cannot recall any new soaps, detergents, lotions, etc. Denies fevers, no SOB, no wheezing/stridor, no abd pain, no injury. Pt has significant hx of dementia.    Past Medical History:  Diagnosis Date  . Alzheimer's dementia   . CAD (coronary artery disease)    a. ardiac cath in 07/2014 showed aneurysmal right coronary artery with sluggish flow and possible chronic layered thrombus distally, borderline significant ostial left circumflex stenosis, otherwise no evidence of obstructive coronary artery disease. Med rx recommended, EF 30-35% at that cath.  . Chronic shortness of breath   . CKD (chronic kidney disease), stage III ([place])   . Essential hypertension   . Memory loss   . NSVT (nonsustained ventricular tachycardia) ([name])    Negative EP study  . Orthostatic hypotension   . Paroxysmal atrial fibrillation ([name])   . Secondary cardiomyopathy ([place])    LVEF 40-45% January 2014  . Syncope   . TIA (transient ischemic attack)     Patient Active Problem List   Diagnosis Date Noted  . Acute on chronic combined systolic and diastolic CHF (congestive heart failure) ([place]) 10/14/2016  . Paroxysmal A-fib ([place]) 10/14/2016  . Acute respiratory failure with hypoxia ([place]) 10/14/2016  . CKD (chronic kidney disease) stage 3, GFR 30-59 ml/min ([name]) 10/14/2016  . Heme positive  stool 08/06/2016  . Orthostatic hypotension 12/25/2014  . Right sided weakness 12/25/2014  . HLD (hyperlipidemia) 12/25/2014  . Hypotension 12/25/2014  . AKI (acute kidney injury) ([place]) 12/25/2014  . History of colonic polyps   . Rectal bleeding 09/03/2014  . Chronic anticoagulation 09/03/2014  . Chest tightness 08/13/2014  . Chest pain 08/13/2014  . Dizziness 08/13/2014  . Episodic lightheadedness   . NSVT (nonsustained ventricular tachycardia) ([place]) 08/05/2014  . Congestive dilated cardiomyopathy ([place]) 08/05/2014  . Syncope 08/05/2014  . Atrial fibrillation with RVR ([place]) 07/30/2014  . Secondary cardiomyopathy ([place]) 06/04/2014  . Essential hypertension 05/02/2013    Past Surgical History:  Procedure Laterality Date  . CARDIOVERSION N/A 10/19/2016   Procedure: CARDIOVERSION;  Surgeon: [name], MD;  [facility];  Service: Cardiovascular;  Laterality: N/A;  . CATARACT EXTRACTION    . COLONOSCOPY  unknown  . COLONOSCOPY N/A 09/05/2014   Procedure: COLONOSCOPY;  Surgeon: [name], MD;  [facility];  Service: Endoscopy;  Laterality: N/A;  145pm  . ELECTROPHYSIOLOGY STUDY N/A 08/05/2014   Procedure: ELECTROPHYSIOLOGY STUDY;  Surgeon: Deboraha Sprang, MD;  Location: [facility] CATH LAB;  Service: Cardiovascular;  Laterality: N/A;  . LAPAROSCOPIC APPENDECTOMY  November 2015   Dr. [name]  . LEFT HEART CATHETERIZATION WITH CORONARY ANGIOGRAM N/A 07/31/2014   Procedure: LEFT HEART CATHETERIZATION WITH CORONARY ANGIOGRAM;  Surgeon: [name], MD;  Location: [facility]  CATH LAB;  Service: Cardiovascular;  Laterality: N/A;  . LOOP RECORDER IMPLANT    . LOOP RECORDER REMOVAL  11/09/2017   MDT LINQ loop recorder removed for end of battery life of the device by Dr Rayann Heman        Home Medications    Prior to Admission medications   Medication Sig Start Date End Date Taking? Authorizing Provider  acetaminophen (TYLENOL) 500 MG tablet Take 1,000-1,500 mg by mouth  every 6 (six) hours as needed for mild pain or moderate pain.   Yes [provider]  amiodarone (PACERONE) 200 MG tablet TAKE (1/2) TABLET BY MOUTH DAILY. 08/15/17  Yes Satira Sark, MD  furosemide (LASIX) 20 MG tablet Take 1 tablet (20 mg total) by mouth as needed (WEIGHT GAIN OF 2LBS OR MORE IN 24 HOURS). 02/01/17  Yes Satira Sark, MD  levothyroxine (SYNTHROID, LEVOTHROID) 25 MCG tablet Take 25 mcg by mouth daily before breakfast.   Yes [provider]  midodrine (PROAMATINE) 5 MG tablet TAKE 1 TABLET BY MOUTH 3 TIMES DAILY WITH MEALS. 09/01/17  Yes Satira Sark, MD  Multiple Vitamins-Minerals (MULTIVITAMIN MEN 50+ PO) Take 1 tablet by mouth daily.   Yes [provider]  XARELTO 15 MG TABS tablet TAKE 1 TABLET BY MOUTH DAILY WITH SUPPER. 03/28/17  Yes Satira Sark, MD  amoxicillin-clavulanate (AUGMENTIN) 500-125 MG tablet Take 1 tablet by mouth 3 (three) times daily. 11/28/17   [provider]    Family History Family History  Problem Relation Age of Onset  . Heart disease Father        Diagnosed in his 8s  . Arthritis Father   . Alzheimer's disease Mother     Social History Social History   Tobacco Use  . Smoking status: Never Smoker  . Smokeless tobacco: Never Used  Substance Use Topics  . Alcohol use: No    Alcohol/week: 0.0 oz  . Drug use: No     Allergies   Patient has no known allergies.   Review of Systems Review of Systems  Unable to perform ROS: Dementia  Skin: Positive for rash.     Physical Exam Updated Vital Signs BP (!) 129/93 (BP Location: Right Arm)   Pulse 70   Temp 98.1 F (36.7 C) (Oral)   Resp 12   SpO2 93%   Physical Exam 2150: Physical examination:  Nursing notes reviewed; Vital signs and O2 SAT reviewed;  Constitutional: Well developed, Well nourished, Well hydrated, In no acute distress; Head:  Normocephalic, atraumatic; Eyes: EOMI, PERRL, No scleral icterus; ENMT: Mouth and pharynx  normal, Mucous membranes moist; Neck: Supple, Full range of motion, No lymphadenopathy; Cardiovascular: Regular rate and rhythm, No gallop; Respiratory: Breath sounds clear & equal bilaterally, No wheezes.  Speaking full sentences with ease, Normal respiratory effort/excursion; Chest: Nontender, Movement normal. +scattered hives to anterior chest and red horizontal welt to right anterior abd at belt line. No vesicles.; Abdomen: Soft, Nontender, Nondistended, Normal bowel sounds; Genitourinary: No CVA tenderness; Extremities: Peripheral pulses normal, No tenderness, No edema, No calf edema or asymmetry. +scattered small areas of erythema/welts to anterior tibial areas, no open wounds, no fluctuance, no streaking, no ecchymosis..; Neuro: Awake, alert, confused per hx dementia. Major CN grossly intact.  Speech clear. No gross focal motor or sensory deficits in extremities.; Skin: Color normal, Warm, Dry.   ED Treatments / Results  Labs (all labs ordered are listed, but only abnormal results are displayed)   EKG None  Radiology   Procedures Procedures (including critical care time)  Medications Ordered in ED Medications  predniSONE (DELTASONE) tablet 60 mg (has no administration in time range)     Initial Impression / Assessment and Plan / ED Course  I have reviewed the triage vital signs and the nursing notes.  Pertinent labs & imaging results that were available during my care of the patient were reviewed by me and considered in my medical decision making (see chart for details).  MDM Reviewed: previous chart, nursing note and vitals   2155:  Scattered red rash/welts to chest and belt line, as well as anterior tibial areas. Does not appear to be cellulitis at this time. Tx for contact dermatitis, f/u PMD. Dx d/w pt and family.  Questions answered.  Verb understanding, agreeable to d/c home with outpt f/u.        Final Clinical Impressions(s) / ED Diagnoses   Final diagnoses:    None    ED Discharge Orders    None       Francine Graven, DO 12/11/17 1753

## 2017-12-09 NOTE — ED Triage Notes (Signed)
Pt c/o rash to lower right leg and lower right abdomen that is red and itches x 5 days, pt took Benadryl last night with no relief

## 2017-12-12 DIAGNOSIS — I509 Heart failure, unspecified: Secondary | ICD-10-CM | POA: Diagnosis not present

## 2017-12-12 DIAGNOSIS — Z6828 Body mass index (BMI) 28.0-28.9, adult: Secondary | ICD-10-CM | POA: Diagnosis not present

## 2017-12-12 DIAGNOSIS — I10 Essential (primary) hypertension: Secondary | ICD-10-CM | POA: Diagnosis not present

## 2017-12-12 DIAGNOSIS — R21 Rash and other nonspecific skin eruption: Secondary | ICD-10-CM | POA: Diagnosis not present

## 2017-12-12 DIAGNOSIS — I639 Cerebral infarction, unspecified: Secondary | ICD-10-CM | POA: Diagnosis not present

## 2017-12-13 ENCOUNTER — Ambulatory Visit: Payer: Medicare Other | Admitting: Physician Assistant

## 2017-12-13 NOTE — Progress Notes (Signed)
Cardiology Office Note    Date:  12/16/2017  ID:  Alejandro NICKOLSON, DOB November 24, 1932, MRN [id number] PCP:  Glenda Chroman, MD  Cardiologist:  Rozann Lesches, MD  Chief Complaint: had episode of weakness  History of Present Illness:  Alejandro [name] is a 84 y.o. male with history of moderate CAD, ?dementia (per wife - she doesn't want patient to know), HTN, NSVT with negative EP study, PAF, CKD stage III, TIA, syncope, NICM EF 40-45%, orthostatic hypotension, chronic dyspnea on exertion who presents for evaluation of weakness.  For recap, cath in 07/2014 showed aneurysmal right coronary artery with sluggish flow and possible chronic layered thrombus distally, borderline significant ostial left circumflex stenosis, otherwise no evidence of obstructive coronary artery disease. LVEF was 30-35% at that time. Medical therapy for coronary artery disease was recommended. The stenosis in the ostial left circumflex was not optimal for PCI due to heavy calcifications at the ostium of the LAD and having to jail a ramus branch. 2D Echo 09/2016 showed mild LVH, EF 40-45% with diffuse HK and HK of the basal mid-inferolateral myocardium, mild MR. Dr. Myles Gip notes have considered the cardiomyopathy non-ischemic and Dr. Jackalyn Lombard have called it ischemic. He's had a loop recorder in place for syncope, and has had diagnosis of atrial fib along the way and is on Xarelto and amiodarone. Last device check 6/12 showed 2.4% AF burden. Device was at [facility] at that time so it was removed. Labs 07/2017 showed Cr 1.36, Na 142, K 4.8, LFTS ok, WBC 13.2, Hgb 14.5, Plt 273, LDL 93, TSH wnl. With regards to his cardiomyopathy, he has had medication intolerances limiting regimen and Lasix is used very infrequently.  He returns for follow-up at the direction of primary care for an episode of weakness that happened 2 weeks ago. In general he's been doing well but on 6/29, Saturday, he developed generalized weakness of his legs. He and his wife  both have a hard time describing this. It wasn't necessarily paralysis or foot drop, it was just that he did not have the strength to stand up. This came on in a gradual onset. It persisted until they saw PCP on Monday. They report primary care worried he may have PNA so put him on an antibiotic and sent him for CXR at [facility] which as reportedly negative for PNA or CHF. They do not think his BP or HR were abnormal at that visit. Primary care suggested he see cardiology. When he woke up on Tuesday his strength was fully back and he's felt absolutely fine since then. He did not have any visual changes, speech changes, facial asymmetry or focal weakness/paralysis - just a generalized weakness.  His wife followed me out of the room to quietly inform me she and the family feel he has Alzheimer's dementia with memory changes but they are not eager to inform the patient of the diagnosis because his family had it and he's previously expressed strong feelings about how he would feel if he ever ended up having the disease. During the visit I had asked about memory loss - she had said in front of him that he had had some ever since prior TIA, and he nodded.   Past Medical History:  Diagnosis Date  . Alzheimer's dementia   . CAD (coronary artery disease)    a. ardiac cath in 07/2014 showed aneurysmal right coronary artery with sluggish flow and possible chronic layered thrombus distally, borderline significant ostial left circumflex stenosis, otherwise no  evidence of obstructive coronary artery disease. Med rx recommended, EF 30-35% at that cath.  . Chronic shortness of breath   . CKD (chronic kidney disease), stage III ([place])   . Essential hypertension   . Memory loss   . NSVT (nonsustained ventricular tachycardia) ([name])    Negative EP study  . Orthostatic hypotension   . Paroxysmal atrial fibrillation ([name])   . Secondary cardiomyopathy ([place])    LVEF 40-45% January 2014  . Syncope   . TIA (transient  ischemic attack)     Past Surgical History:  Procedure Laterality Date  . CARDIOVERSION N/A 10/19/2016   Procedure: CARDIOVERSION;  Surgeon: [name], MD;  Location[facility];  Service: Cardiovascular;  Laterality: N/A;  . CATARACT EXTRACTION    . COLONOSCOPY  unknown  . COLONOSCOPY N/A 09/05/2014   Procedure: COLONOSCOPY;  Surgeon: [name], MD;  Location: [facility];  Service: Endoscopy;  Laterality: N/A;  145pm  . ELECTROPHYSIOLOGY STUDY N/A 08/05/2014   Procedure: ELECTROPHYSIOLOGY STUDY;  Surgeon: Deboraha Sprang, MD;  Location[facility] CATH LAB;  Service: Cardiovascular;  Laterality: N/A;  . LAPAROSCOPIC APPENDECTOMY  November 2015   Dr. [name]  . LEFT HEART CATHETERIZATION WITH CORONARY ANGIOGRAM N/A 07/31/2014   Procedure: LEFT HEART CATHETERIZATION WITH CORONARY ANGIOGRAM;  Surgeon: [name], MD;  Location: [place] CATH LAB;  Service: Cardiovascular;  Laterality: N/A;  . LOOP RECORDER IMPLANT    . LOOP RECORDER REMOVAL  11/09/2017   MDT LINQ loop recorder removed for end of battery life of the device by Dr Rayann Heman    Current Medications: Current Meds  Medication Sig  . acetaminophen (TYLENOL) 500 MG tablet Take 1,000-1,500 mg by mouth every 6 (six) hours as needed for mild pain or moderate pain.  Marland Kitchen amiodarone (PACERONE) 200 MG tablet TAKE (1/2) TABLET BY MOUTH DAILY.  . furosemide (LASIX) 20 MG tablet Take 1 tablet (20 mg total) by mouth as needed (WEIGHT GAIN OF 2LBS OR MORE IN 24 HOURS).  Marland Kitchen levothyroxine (SYNTHROID, LEVOTHROID) 25 MCG tablet Take 25 mcg by mouth daily before breakfast.  . midodrine (PROAMATINE) 5 MG tablet TAKE 1 TABLET BY MOUTH 3 TIMES DAILY WITH MEALS.  . Multiple Vitamins-Minerals (MULTIVITAMIN MEN 50+ PO) Take 1 tablet by mouth daily.  Alveda Reasons 15 MG TABS tablet TAKE 1 TABLET BY MOUTH DAILY WITH SUPPER.    Allergies:   Patient has no known allergies.   Social History   Socioeconomic History  . Marital status: Married    Spouse  name: Not on file  . Number of children: Not on file  . Years of education: 76  . Highest education level: Not on file  Occupational History  . Occupation: Retired  Scientific laboratory technician  . Financial resource strain: Not on file  . Food insecurity:    Worry: Not on file    Inability: Not on file  . Transportation needs:    Medical: Not on file    Non-medical: Not on file  Tobacco Use  . Smoking status: Never Smoker  . Smokeless tobacco: Never Used  Substance and Sexual Activity  . Alcohol use: No    Alcohol/week: 0.0 oz  . Drug use: No  . Sexual activity: Not on file  Lifestyle  . Physical activity:    Days per week: Not on file    Minutes per session: Not on file  . Stress: Not on file  Relationships  . Social connections:    Talks on  phone: Not on file    Gets together: Not on file    Attends religious service: Not on file    Active member of club or organization: Not on file    Attends meetings of clubs or organizations: Not on file    Relationship status: Not on file  Other Topics Concern  . Not on file  Social History Narrative   Caffeine: 2 drinks a day      Family History:  The patient's family history includes Alzheimer's disease in his mother; Arthritis in his father; Heart disease in his father.  ROS:   Please see the history of present illness.  All other systems are reviewed and otherwise negative.    PHYSICAL EXAM:   VS:  BP 120/78   Pulse 64   Ht 5\' 8"  (1.727 m)   Wt 183 lb (83 kg)   SpO2 94% Comment: on room air  BMI 27.83 kg/m   BMI: Body mass index is 27.83 kg/m. GEN: Well nourished, well developed WM, in no acute distress HEENT: normocephalic, atraumatic Neck: no JVD, carotid bruits, or masses Cardiac: RRR; no murmurs, rubs, or gallops, no edema  Respiratory:  clear to auscultation bilaterally, normal work of breathing GI: soft, nontender, nondistended, + BS MS: no deformity or atrophy Skin: warm and dry, no rash Neuro:  Alert and Oriented x  3, Strength and sensation are intact, follows commands, no focal deficit, strength equal bilaterally, pupils equal bilaterally, good eye contact, no facial asymmetry Psych: euthymic mood, full affect  Wt Readings from Last 3 Encounters:  12/16/17 183 lb (83 kg)  11/09/17 182 lb (82.6 kg)  07/11/17 172 lb (78 kg)      Studies/Labs Reviewed:   EKG:  EKG was ordered today and personally reviewed by me and demonstrates NSR 61bpm first degree AVB, LAFB, nonspecific TW changes, no acute change from prior.  Recent Labs: No results found for requested labs within last 8760 hours.   Lipid Panel    Component Value Date/Time   CHOL 138 08/03/2014 0304   TRIG 73 08/03/2014 0304   HDL 33 (L) 08/03/2014 0304   CHOLHDL 4.2 08/03/2014 0304   VLDL 15 08/03/2014 0304   LDLCALC 90 08/03/2014 0304    Additional studies/ records that were reviewed today include: Summarized above   ASSESSMENT & PLAN:   1. Weakness - the episode sounds extremely unusual for any specific cardiac pathology, so long as BP and HR were normal. It lasted approximately 2 days. The wife and patient deny any acute heart rate or blood pressure changes surrounding the event. He was reported to have possible PNA although dx unclear. He has felt fine for the past 13 days without recurrence. I would be more concerned for neurologic event. I'm not sure acute imaging would be of much utility given that symptoms have resolved. I will check screening labs today including CBC, lytes, renal function, and B12 level to r/o metabolic abnormality. The dementia diagnosis sounds presumptive per wife - in light of the above I feel a formal referral to neurology would be helpful. They are both amenable. 2. Non-ischemic cardiomyopathy - appears euvolemic. Med titration limited by orthostasis. Doing well. 3. Orthostatic hypotension - BP currently normal. Will have nurse Kisha call over to primary care to find out if there was a specific cardiac  finding at their office visit which prompted recommendation to see cardiology for the persistent weakness. 4. Paroxysmal atrial fibrillation - maintaining NSR. CrCl 91ml so dose of  Xarelto is appropriate. Check screening CBC with labs today given odd episode of weakness. He is on very low dose of amio by direction of EP. Further monitoring of organ systems while on amiodarone will be at discretion of primary cardiologist. Check thyroid. 5. CAD - asymptomatic. Continue to monitor. 6. Memory loss - as above. Refer to neurology.   Disposition: F/u with Dr. Domenic Polite in 4 months.   Medication Adjustments/Labs and Tests Ordered: Current medicines are reviewed at length with the patient today.  Concerns regarding medicines are outlined above. Medication changes, Labs and Tests ordered today are summarized above and listed in the Patient Instructions accessible in Encounters.   Signed, Charlie Pitter, PA-C  12/16/2017 1:29 PM    [facility] [place] Location in [place]. [place], [place] [address] Ph: [phone]; Fax [phone]

## 2017-12-16 ENCOUNTER — Telehealth: Payer: Self-pay | Admitting: *Deleted

## 2017-12-16 ENCOUNTER — Ambulatory Visit (INDEPENDENT_AMBULATORY_CARE_PROVIDER_SITE_OTHER): Payer: Medicare Other | Admitting: Physician Assistant

## 2017-12-16 ENCOUNTER — Other Ambulatory Visit (HOSPITAL_COMMUNITY)
Admission: RE | Admit: 2017-12-16 | Discharge: 2017-12-16 | Disposition: A | Discharge status: Home / Self Care | Payer: Medicare Other | Source: Ambulatory Visit | Attending: Physician Assistant | Admitting: Physician Assistant

## 2017-12-16 ENCOUNTER — Encounter: Payer: Self-pay | Admitting: Physician Assistant

## 2017-12-16 VITALS — BP 120/78 | HR 64 | Ht 68.0 in | Wt 183.0 lb

## 2017-12-16 DIAGNOSIS — R413 Other amnesia: Secondary | ICD-10-CM | POA: Diagnosis not present

## 2017-12-16 DIAGNOSIS — I428 Other cardiomyopathies: Secondary | ICD-10-CM

## 2017-12-16 DIAGNOSIS — I480 Paroxysmal atrial fibrillation: Secondary | ICD-10-CM | POA: Diagnosis not present

## 2017-12-16 DIAGNOSIS — I639 Cerebral infarction, unspecified: Secondary | ICD-10-CM | POA: Diagnosis not present

## 2017-12-16 DIAGNOSIS — I951 Orthostatic hypotension: Secondary | ICD-10-CM | POA: Diagnosis not present

## 2017-12-16 DIAGNOSIS — I2510 Atherosclerotic heart disease of native coronary artery without angina pectoris: Secondary | ICD-10-CM | POA: Diagnosis not present

## 2017-12-16 DIAGNOSIS — R531 Weakness: Secondary | ICD-10-CM

## 2017-12-16 LAB — CBC WITH DIFFERENTIAL/PLATELET
Basophils Absolute: 0 10*3/uL (ref 0.0–0.1)
Basophils Relative: 0 %
EOS PCT: 2 %
Eosinophils Absolute: 0.3 10*3/uL (ref 0.0–0.7)
HEMATOCRIT: 42.3 % (ref 39.0–52.0)
Hemoglobin: 14 g/dL (ref 13.0–17.0)
LYMPHS ABS: 4.4 10*3/uL — AB (ref 0.7–4.0)
LYMPHS PCT: 31 %
MCH: 31.8 pg (ref 26.0–34.0)
MCHC: 33.1 g/dL (ref 30.0–36.0)
MCV: 96.1 fL (ref 78.0–100.0)
MONO ABS: 1.7 10*3/uL — AB (ref 0.1–1.0)
MONOS PCT: 12 %
Neutro Abs: 7.9 10*3/uL (ref 1.7–7.7)
Neutrophils Relative %: 55 %
Platelets: 291 10*3/uL (ref 150–400)
RBC: 4.4 MIL/uL (ref 4.22–5.81)
RDW: 14.6 % (ref 11.5–15.5)
WBC: 14.3 10*3/uL — AB (ref 4.0–10.5)

## 2017-12-16 LAB — BASIC METABOLIC PANEL
Anion gap: 7 (ref 5–15)
BUN: 23 mg/dL (ref 8–23)
CO2: 26 mmol/L (ref 22–32)
CREATININE: 1.23 mg/dL (ref 0.61–1.24)
Calcium: 8.7 mg/dL — ABNORMAL LOW (ref 8.9–10.3)
Chloride: 103 mmol/L (ref 98–111)
GFR calc Af Amer: >60 mL/min (ref >60)
GFR, EST NON AFRICAN AMERICAN: 52 mL/min — AB (ref >60)
GLUCOSE: 91 mg/dL (ref 70–99)
POTASSIUM: 3.7 mmol/L (ref 3.5–5.1)
Sodium: 136 mmol/L (ref 135–145)

## 2017-12-16 LAB — VITAMIN B12: Vitamin B-12: 116 pg/mL — ABNORMAL LOW (ref 180–914)

## 2017-12-16 LAB — MAGNESIUM: Magnesium: 2.1 mg/dL (ref 1.7–2.4)

## 2017-12-16 LAB — TSH: TSH: 2.861 u[IU]/mL (ref 0.350–4.500)

## 2017-12-16 NOTE — Telephone Encounter (Signed)
Called Dr. Woody Seller office to obtain more information on the reason for pt's visit today. Spoke with Fraser Din who states pt with seen in office on 7/1 for HTN, SOB and CHF. Pt had a chest x-ray at that time that was noted to be negative for pneumonia.

## 2017-12-16 NOTE — Patient Instructions (Signed)
Medication Instructions:  Your physician recommends that you continue on your current medications as directed. Please refer to the Current Medication list given to you today.   Labwork: Your physician recommends that you return for lab work in: Today   Testing/Procedures: NONE   Follow-Up: Your physician recommends that you schedule a follow-up appointment in: 4 Months  You have been referred to Neurology   Any Other Special Instructions Will Be Listed Below (If Applicable).     If you need a refill on your cardiac medications before your next appointment, please call your pharmacy. Thank you for choosing [facility]!

## 2017-12-16 NOTE — Telephone Encounter (Signed)
The patient and his wife also state they were told CXR was neg for CHF. Clinically the patient is doing great, so please see if we can just get a copy of the CXR sent to my Epic inbox. Thanks. Dayna Dunn PA-C

## 2017-12-20 ENCOUNTER — Encounter: Payer: Self-pay | Admitting: *Deleted

## 2017-12-20 NOTE — Telephone Encounter (Signed)
Request sent 

## 2017-12-20 NOTE — Telephone Encounter (Signed)
CXR received and sent to Epic inbox

## 2017-12-30 ENCOUNTER — Encounter: Payer: Medicare Other | Admitting: Internal Medicine

## 2018-01-19 DIAGNOSIS — F0390 Unspecified dementia without behavioral disturbance: Secondary | ICD-10-CM | POA: Diagnosis not present

## 2018-01-19 DIAGNOSIS — Z299 Encounter for prophylactic measures, unspecified: Secondary | ICD-10-CM | POA: Diagnosis not present

## 2018-01-19 DIAGNOSIS — I10 Essential (primary) hypertension: Secondary | ICD-10-CM | POA: Diagnosis not present

## 2018-01-19 DIAGNOSIS — Z713 Dietary counseling and surveillance: Secondary | ICD-10-CM | POA: Diagnosis not present

## 2018-01-19 DIAGNOSIS — Z6828 Body mass index (BMI) 28.0-28.9, adult: Secondary | ICD-10-CM | POA: Diagnosis not present

## 2018-01-29 DIAGNOSIS — J849 Interstitial pulmonary disease, unspecified: Secondary | ICD-10-CM

## 2018-01-29 HISTORY — DX: Interstitial pulmonary disease, unspecified: J84.9

## 2018-01-31 ENCOUNTER — Inpatient Hospital Stay (HOSPITAL_COMMUNITY)
Admission: EM | Admit: 2018-01-31 | Discharge: 2018-02-05 | DRG: 291 | Disposition: A | Discharge status: Home Health | Payer: Medicare Other | Attending: Internal Medicine | Admitting: Internal Medicine

## 2018-01-31 ENCOUNTER — Other Ambulatory Visit: Payer: Self-pay

## 2018-01-31 ENCOUNTER — Emergency Department (HOSPITAL_COMMUNITY): Payer: Medicare Other

## 2018-01-31 ENCOUNTER — Observation Stay (HOSPITAL_BASED_OUTPATIENT_CLINIC_OR_DEPARTMENT_OTHER): Payer: Medicare Other

## 2018-01-31 ENCOUNTER — Encounter (HOSPITAL_COMMUNITY): Payer: Self-pay | Admitting: Emergency Medicine

## 2018-01-31 DIAGNOSIS — I509 Heart failure, unspecified: Secondary | ICD-10-CM | POA: Diagnosis not present

## 2018-01-31 DIAGNOSIS — N183 Chronic kidney disease, stage 3 unspecified: Secondary | ICD-10-CM | POA: Diagnosis present

## 2018-01-31 DIAGNOSIS — E785 Hyperlipidemia, unspecified: Secondary | ICD-10-CM | POA: Diagnosis present

## 2018-01-31 DIAGNOSIS — J9601 Acute respiratory failure with hypoxia: Secondary | ICD-10-CM | POA: Diagnosis not present

## 2018-01-31 DIAGNOSIS — G309 Alzheimer's disease, unspecified: Secondary | ICD-10-CM | POA: Diagnosis present

## 2018-01-31 DIAGNOSIS — I130 Hypertensive heart and chronic kidney disease with heart failure and stage 1 through stage 4 chronic kidney disease, or unspecified chronic kidney disease: Secondary | ICD-10-CM | POA: Diagnosis not present

## 2018-01-31 DIAGNOSIS — Z7901 Long term (current) use of anticoagulants: Secondary | ICD-10-CM

## 2018-01-31 DIAGNOSIS — I5023 Acute on chronic systolic (congestive) heart failure: Secondary | ICD-10-CM | POA: Diagnosis present

## 2018-01-31 DIAGNOSIS — N179 Acute kidney failure, unspecified: Secondary | ICD-10-CM | POA: Diagnosis not present

## 2018-01-31 DIAGNOSIS — I340 Nonrheumatic mitral (valve) insufficiency: Secondary | ICD-10-CM | POA: Diagnosis not present

## 2018-01-31 DIAGNOSIS — Z8673 Personal history of transient ischemic attack (TIA), and cerebral infarction without residual deficits: Secondary | ICD-10-CM

## 2018-01-31 DIAGNOSIS — I5043 Acute on chronic combined systolic (congestive) and diastolic (congestive) heart failure: Secondary | ICD-10-CM | POA: Diagnosis not present

## 2018-01-31 DIAGNOSIS — T462X5A Adverse effect of other antidysrhythmic drugs, initial encounter: Secondary | ICD-10-CM | POA: Diagnosis present

## 2018-01-31 DIAGNOSIS — I272 Pulmonary hypertension, unspecified: Secondary | ICD-10-CM | POA: Diagnosis present

## 2018-01-31 DIAGNOSIS — R079 Chest pain, unspecified: Secondary | ICD-10-CM | POA: Diagnosis not present

## 2018-01-31 DIAGNOSIS — I110 Hypertensive heart disease with heart failure: Secondary | ICD-10-CM | POA: Diagnosis not present

## 2018-01-31 DIAGNOSIS — I480 Paroxysmal atrial fibrillation: Secondary | ICD-10-CM | POA: Diagnosis present

## 2018-01-31 DIAGNOSIS — I10 Essential (primary) hypertension: Secondary | ICD-10-CM | POA: Diagnosis present

## 2018-01-31 DIAGNOSIS — E039 Hypothyroidism, unspecified: Secondary | ICD-10-CM | POA: Diagnosis present

## 2018-01-31 DIAGNOSIS — Z79890 Hormone replacement therapy (postmenopausal): Secondary | ICD-10-CM

## 2018-01-31 DIAGNOSIS — R0602 Shortness of breath: Secondary | ICD-10-CM

## 2018-01-31 DIAGNOSIS — I429 Cardiomyopathy, unspecified: Secondary | ICD-10-CM | POA: Diagnosis present

## 2018-01-31 DIAGNOSIS — J704 Drug-induced interstitial lung disorders, unspecified: Secondary | ICD-10-CM | POA: Diagnosis present

## 2018-01-31 DIAGNOSIS — I951 Orthostatic hypotension: Secondary | ICD-10-CM | POA: Diagnosis present

## 2018-01-31 DIAGNOSIS — F0280 Dementia in other diseases classified elsewhere without behavioral disturbance: Secondary | ICD-10-CM | POA: Diagnosis present

## 2018-01-31 DIAGNOSIS — I2510 Atherosclerotic heart disease of native coronary artery without angina pectoris: Secondary | ICD-10-CM | POA: Diagnosis present

## 2018-01-31 LAB — CBC WITH DIFFERENTIAL/PLATELET
BASOS ABS: 0.1 10*3/uL (ref 0.0–0.1)
Basophils Relative: 1 %
Eosinophils Absolute: 0.3 10*3/uL (ref 0.0–0.7)
Eosinophils Relative: 2 %
HEMATOCRIT: 35.6 % — AB (ref 39.0–52.0)
HEMOGLOBIN: 11.3 g/dL — AB (ref 13.0–17.0)
LYMPHS ABS: 2.1 10*3/uL (ref 0.7–4.0)
LYMPHS PCT: 19 %
MCH: 31 pg (ref 26.0–34.0)
MCHC: 31.7 g/dL (ref 30.0–36.0)
MCV: 97.8 fL (ref 78.0–100.0)
Monocytes Absolute: 1.2 10*3/uL — ABNORMAL HIGH (ref 0.1–1.0)
Monocytes Relative: 11 %
NEUTROS ABS: 7.2 10*3/uL (ref 1.7–7.7)
NEUTROS PCT: 67 %
PLATELETS: 229 10*3/uL (ref 150–400)
RBC: 3.64 MIL/uL — AB (ref 4.22–5.81)
RDW: 15.8 % — ABNORMAL HIGH (ref 11.5–15.5)
WBC: 10.7 10*3/uL — AB (ref 4.0–10.5)

## 2018-01-31 LAB — COMPREHENSIVE METABOLIC PANEL
ALBUMIN: 3.2 g/dL — AB (ref 3.5–5.0)
ALK PHOS: 44 U/L (ref 38–126)
ALT: 10 U/L (ref 0–44)
AST: 23 U/L (ref 15–41)
Anion gap: 8 (ref 5–15)
BUN: 16 mg/dL (ref 8–23)
CALCIUM: 8.4 mg/dL — AB (ref 8.9–10.3)
CHLORIDE: 109 mmol/L (ref 98–111)
CO2: 21 mmol/L — AB (ref 22–32)
CREATININE: 1.24 mg/dL (ref 0.61–1.24)
GFR calc non Af Amer: 52 mL/min — ABNORMAL LOW (ref >60)
GLUCOSE: 132 mg/dL — AB (ref 70–99)
Potassium: 3.5 mmol/L (ref 3.5–5.1)
SODIUM: 138 mmol/L (ref 135–145)
Total Bilirubin: 1.7 mg/dL — ABNORMAL HIGH (ref 0.3–1.2)
Total Protein: 6.8 g/dL (ref 6.5–8.1)

## 2018-01-31 LAB — BRAIN NATRIURETIC PEPTIDE: B NATRIURETIC PEPTIDE 5: 1122 pg/mL — AB (ref 0.0–100.0)

## 2018-01-31 LAB — TROPONIN I
Troponin I: 0.04 ng/mL (ref <0.03)
Troponin I: 0.04 ng/mL (ref <0.03)
Troponin I: 0.04 ng/mL (ref <0.03)

## 2018-01-31 LAB — ECHOCARDIOGRAM COMPLETE
HEIGHTINCHES: 71 in
Weight: 2927.71 oz

## 2018-01-31 LAB — TSH: TSH: 3.948 u[IU]/mL (ref 0.350–4.500)

## 2018-01-31 LAB — APTT: APTT: 44 s — AB (ref 24–36)

## 2018-01-31 LAB — PROTIME-INR
INR: 1.72
Prothrombin Time: 20 seconds — ABNORMAL HIGH (ref 11.4–15.2)

## 2018-01-31 MED ORDER — RIVAROXABAN 15 MG PO TABS
15.0000 mg | ORAL_TABLET | Freq: Every day | ORAL | Status: DC
  Administered 2018-01-31 – 2018-02-05 (×6): 15 mg via ORAL
  Filled 2018-01-31 (×6): qty 1

## 2018-01-31 MED ORDER — ADULT MULTIVITAMIN W/MINERALS CH
ORAL_TABLET | Freq: Every day | ORAL | Status: DC
  Administered 2018-02-01 – 2018-02-05 (×5): 1 via ORAL
  Filled 2018-01-31 (×5): qty 1

## 2018-01-31 MED ORDER — SODIUM CHLORIDE 0.9% FLUSH
3.0000 mL | Freq: Two times a day (BID) | INTRAVENOUS | Status: DC
  Administered 2018-01-31 – 2018-02-05 (×10): 3 mL via INTRAVENOUS

## 2018-01-31 MED ORDER — ACETAMINOPHEN 325 MG PO TABS
650.0000 mg | ORAL_TABLET | Freq: Four times a day (QID) | ORAL | Status: DC | PRN
  Administered 2018-02-04 – 2018-02-05 (×3): 650 mg via ORAL
  Filled 2018-01-31 (×3): qty 2

## 2018-01-31 MED ORDER — LEVOTHYROXINE SODIUM 25 MCG PO TABS
25.0000 ug | ORAL_TABLET | Freq: Every day | ORAL | Status: DC
  Administered 2018-02-01 – 2018-02-05 (×5): 25 ug via ORAL
  Filled 2018-01-31 (×5): qty 1

## 2018-01-31 MED ORDER — AMIODARONE HCL 200 MG PO TABS
100.0000 mg | ORAL_TABLET | Freq: Every day | ORAL | Status: DC
  Administered 2018-02-01 – 2018-02-02 (×2): 100 mg via ORAL
  Filled 2018-01-31 (×2): qty 1

## 2018-01-31 MED ORDER — ONDANSETRON HCL 4 MG/2ML IJ SOLN: 4.0000 mg | Freq: Four times a day (QID) | INTRAMUSCULAR | Status: DC | PRN

## 2018-01-31 MED ORDER — FUROSEMIDE 10 MG/ML IJ SOLN
40.0000 mg | Freq: Two times a day (BID) | INTRAMUSCULAR | Status: DC
  Administered 2018-01-31 – 2018-02-02 (×4): 40 mg via INTRAVENOUS
  Filled 2018-01-31 (×4): qty 4

## 2018-01-31 MED ORDER — SODIUM CHLORIDE 0.9% FLUSH: 3.0000 mL | INTRAVENOUS | Status: DC | PRN

## 2018-01-31 MED ORDER — SODIUM CHLORIDE 0.9 % IV SOLN
INTRAVENOUS | Status: DC
  Administered 2018-01-31: 11:00:00 via INTRAVENOUS

## 2018-01-31 MED ORDER — MIDODRINE HCL 5 MG PO TABS
5.0000 mg | ORAL_TABLET | Freq: Three times a day (TID) | ORAL | Status: DC
  Administered 2018-01-31 – 2018-02-02 (×7): 5 mg via ORAL
  Filled 2018-01-31 (×7): qty 1

## 2018-01-31 MED ORDER — ONDANSETRON HCL 4 MG PO TABS: 4.0000 mg | ORAL_TABLET | Freq: Four times a day (QID) | ORAL | Status: DC | PRN

## 2018-01-31 MED ORDER — SODIUM CHLORIDE 0.9 % IV SOLN: 250.0000 mL | INTRAVENOUS | Status: DC | PRN

## 2018-01-31 MED ORDER — ACETAMINOPHEN 650 MG RE SUPP: 650.0000 mg | Freq: Four times a day (QID) | RECTAL | Status: DC | PRN

## 2018-01-31 MED ORDER — FUROSEMIDE 10 MG/ML IJ SOLN
80.0000 mg | Freq: Once | INTRAMUSCULAR | Status: AC
  Administered 2018-01-31: 80 mg via INTRAVENOUS
  Filled 2018-01-31: qty 8

## 2018-01-31 NOTE — ED Triage Notes (Signed)
Patient complains of shortness of breath x 3 days. States dizziness if he has to walk more than 10 steps. Patient has labored breathing after taking 3 steps in triage. History of CHF. Patient is pale in triage.

## 2018-01-31 NOTE — ED Notes (Signed)
ED TO INPATIENT HANDOFF REPORT  Name/Age/Gender Alejandro [name] 84 y.o. male  Code Status    Code Status Orders  (From admission, onward)         Start     Ordered   01/31/18 1235  Full code  Continuous     01/31/18 1236        Code Status History    Date Active Date Inactive Code Status Order ID Comments User Context   10/14/2016 1824 10/21/2016 1830 Full Code [id number]  Orson Eva, MD Inpatient   12/25/2014 2133 12/26/2014 1421 Full Code [id number]  Waldemar Dickens, MD Inpatient   08/13/2014 0310 08/13/2014 2109 Full Code [id number]  Oswald Hillock, MD Inpatient   08/05/2014 1706 08/06/2014 1502 Full Code [id number]  Deboraha Sprang, MD Inpatient   07/31/2014 1752 08/05/2014 1706 Full Code [id number]  [name], MD Inpatient   07/30/2014 1552 07/31/2014 1752 Full Code [id number]  Erline Hau, MD Inpatient      Home/SNF/Other Home  Chief Complaint weakness sob  Level of Care/Admitting Diagnosis ED Disposition    ED Disposition Condition [place]: [facility] [[id number]]  Level of Care: Telemetry [5]  Diagnosis: CHF (congestive heart failure) [facility]) [[id number]]  Admitting Physician: Rodena Goldmann [[id number]]  Attending Physician: Heath Lark D [[id number]]  PT Class (Do Not Modify): Observation [104]  PT Acc Code (Do Not Modify): Observation [10022]       Medical History Past Medical History:  Diagnosis Date  . Alzheimer's dementia   . CAD (coronary artery disease)    a. ardiac cath in 07/2014 showed aneurysmal right coronary artery with sluggish flow and possible chronic layered thrombus distally, borderline significant ostial left circumflex stenosis, otherwise no evidence of obstructive coronary artery disease. Med rx recommended, EF 30-35% at that cath.  . Chronic shortness of breath   . CKD (chronic kidney disease), stage III ([place])   . Essential hypertension   . Memory loss   . NSVT (nonsustained ventricular tachycardia) ([name])     Negative EP study  . Orthostatic hypotension   . Paroxysmal atrial fibrillation ([name])   . Secondary cardiomyopathy ([place])    LVEF 40-45% January 2014  . Syncope   . TIA (transient ischemic attack)     Allergies No Known Allergies  IV Location/Drains/Wounds Patient Lines/Drains/Airways Status   Active Line/Drains/Airways    Name:   Placement date:   Placement time:   Site:   Days:   Peripheral IV 01/31/18 Left;Lateral Wrist   01/31/18    1027    Wrist   less than 1          Labs/Imaging Results for orders placed or performed during the hospital encounter of 01/31/18 (from the past 48 hour(s))  CBC with Differential/Platelet     Status: Abnormal   Collection Time: 01/31/18 10:27 AM  Result Value Ref Range   WBC 10.7 (H) 4.0 - 10.5 K/uL   RBC 3.64 (L) 4.22 - 5.81 MIL/uL   Hemoglobin 11.3 (L) 13.0 - 17.0 g/dL   HCT 35.6 (L) 39.0 - 52.0 %   MCV 97.8 78.0 - 100.0 fL   MCH 31.0 26.0 - 34.0 pg   MCHC 31.7 30.0 - 36.0 g/dL   RDW 15.8 (H) 11.5 - 15.5 %   Platelets 229 150 - 400 K/uL   Neutrophils Relative % 67 %   Neutro Abs 7.2 1.7 - 7.7 K/uL   Lymphocytes Relative 19 %  Lymphs Abs 2.1 0.7 - 4.0 K/uL   Monocytes Relative 11 %   Monocytes Absolute 1.2 (H) 0.1 - 1.0 K/uL   Eosinophils Relative 2 %   Eosinophils Absolute 0.3 0.0 - 0.7 K/uL   Basophils Relative 1 %   Basophils Absolute 0.1 0.0 - 0.1 K/uL    Comment: Performed at [facility], [address], [place], [place] [address]  Protime-INR     Status: Abnormal   Collection Time: 01/31/18 10:27 AM  Result Value Ref Range   Prothrombin Time 20.0 (H) 11.4 - 15.2 seconds   INR 1.72     Comment: Performed at [facility], [address], [place], [place] [address]  APTT     Status: Abnormal   Collection Time: 01/31/18 10:27 AM  Result Value Ref Range   aPTT 44 (H) 24 - 36 seconds    Comment:        IF BASELINE aPTT IS ELEVATED, SUGGEST PATIENT RISK ASSESSMENT BE USED TO DETERMINE APPROPRIATE ANTICOAGULANT  THERAPY. Performed at [facility], [address], [place], [place] [address]   Comprehensive metabolic panel     Status: Abnormal   Collection Time: 01/31/18 10:27 AM  Result Value Ref Range   Sodium 138 135 - 145 mmol/L   Potassium 3.5 3.5 - 5.1 mmol/L   Chloride 109 98 - 111 mmol/L   CO2 21 (L) 22 - 32 mmol/L   Glucose, Bld 132 (H) 70 - 99 mg/dL   BUN 16 8 - 23 mg/dL   Creatinine, Ser 1.24 0.61 - 1.24 mg/dL   Calcium 8.4 (L) 8.9 - 10.3 mg/dL   Total Protein 6.8 6.5 - 8.1 g/dL   Albumin 3.2 (L) 3.5 - 5.0 g/dL   AST 23 15 - 41 U/L   ALT 10 0 - 44 U/L   Alkaline Phosphatase 44 38 - 126 U/L   Total Bilirubin 1.7 (H) 0.3 - 1.2 mg/dL   GFR calc non Af Amer 52 (L) >60 mL/min   GFR calc Af Amer >60 >60 mL/min    Comment: (NOTE) The eGFR has been calculated using the CKD EPI equation. This calculation has not been validated in all clinical situations. eGFR's persistently <60 mL/min signify possible Chronic Kidney Disease.    Anion gap 8 5 - 15    Comment: Performed at [facility], [address] [place], [place] [address]  Troponin I     Status: Abnormal   Collection Time: 01/31/18 10:27 AM  Result Value Ref Range   Troponin I 0.04 (HH) <0.03 ng/mL    Comment: CRITICAL RESULT CALLED TO, READ BACK BY AND VERIFIED WITH: MINTER,R AT 11:20AM ON 01/31/18 BY [facility] Performed at [facility], [address], [place], [place] [address]   Brain natriuretic peptide     Status: Abnormal   Collection Time: 01/31/18 10:27 AM  Result Value Ref Range   B Natriuretic Peptide 1,122.0 (H) 0.0 - 100.0 pg/mL    Comment: Performed at [facility], [address], [place], [place] [address]   Dg Chest Port 1 View  Result Date: 01/31/2018 CLINICAL DATA:  Progressive chest pain and shortness of breath. EXAM: PORTABLE CHEST 1 VIEW COMPARISON:  11/28/2017 and 10/14/2016 FINDINGS: Heart size and pulmonary vascularity are within normal limits considering the AP portable technique. No infiltrates or  effusions. Interstitial markings are slightly accentuated on a chronic basis. No acute bone abnormality. IMPRESSION: No acute abnormalities. Slight chronic accentuation of the interstitial markings. Electronically Signed   By: Jeneen Rinks  Maxwell M.D.   On: 01/31/2018 10:41    Pending Labs Unresulted Labs (From admission, onward)    Start     Ordered   02/01/18 0500  Magnesium  Tomorrow morning,   R     01/31/18 1236   02/01/18 3735  Basic metabolic panel  Tomorrow morning,   R     01/31/18 1236   02/01/18 0500  CBC  Tomorrow morning,   R     01/31/18 1236   01/31/18 1237  TSH  Once,   R     01/31/18 1236   01/31/18 1236  Troponin I  Now then every 6 hours,   R     01/31/18 1236          Vitals/Pain Today's Vitals   01/31/18 1130 01/31/18 1200 01/31/18 1230 01/31/18 1300  BP: 100/78 107/83 100/66 106/84  Pulse: 63 91 (!) 51 (!) 51  Resp: (!) 22 (!) 23 18 (!) 25  Temp:      TempSrc:      SpO2: 95% 92% (!) 89% 91%  Weight:      Height:      PainSc:        Isolation Precautions No active isolations  Medications Medications  amiodarone (PACERONE) tablet 100 mg (has no administration in time range)  midodrine (PROAMATINE) tablet 5 mg (has no administration in time range)  levothyroxine (SYNTHROID, LEVOTHROID) tablet 25 mcg (has no administration in time range)  Rivaroxaban (XARELTO) tablet 15 mg (has no administration in time range)  multivitamin with minerals tablet (has no administration in time range)  sodium chloride flush (NS) 0.9 % injection 3 mL (3 mLs Intravenous Not Given 01/31/18 1245)  sodium chloride flush (NS) 0.9 % injection 3 mL (has no administration in time range)  0.9 %  sodium chloride infusion (has no administration in time range)  acetaminophen (TYLENOL) tablet 650 mg (has no administration in time range)    Or  acetaminophen (TYLENOL) suppository 650 mg (has no administration in time range)  ondansetron (ZOFRAN) tablet 4 mg (has no administration in time  range)    Or  ondansetron (ZOFRAN) injection 4 mg (has no administration in time range)  furosemide (LASIX) injection 40 mg (has no administration in time range)  furosemide (LASIX) injection 80 mg (80 mg Intravenous Given 01/31/18 1125)    Mobility walks with person assist

## 2018-01-31 NOTE — ED Notes (Signed)
CRITICAL VALUE ALERT  Critical Value:  Trop 0.04  Date & Time Notied:  01/31/18, 1119  Provider Notified: Evalee Jefferson, PA  Orders Received/Actions taken: No new orders at this time

## 2018-01-31 NOTE — ED Provider Notes (Signed)
[facility] EMERGENCY DEPARTMENT Provider Note   CSN: [id number] Arrival date & time: 01/31/18  3662     History   Chief Complaint Chief Complaint  Patient presents with  . Shortness of Breath    HPI Alejandro [name] is a 85 y.o. male with a history of CAD, chronic stage III kidney disease, HTN, paroxysmal afib on Xarelto, TIA and cardiomyopathy with CHF, EF 30-35% in 2016 and alzheimers presenting with a 1-2 week hx of  Increasing weakness and sob, worsening this am, reporting he has been unable to walk more than 3 steps before becoming significantly sob today. Associated symptoms include chest pain described as numbness across his chest.  He denies palpitations, n/v, focal weakness, headache, abdominal pain and has no peripheral edema.  He does not feel his afib when symptomatic, denies orthopnea, denies fevers, chills or significant coughing.   HPI  Past Medical History:  Diagnosis Date  . Alzheimer's dementia   . CAD (coronary artery disease)    a. ardiac cath in 07/2014 showed aneurysmal right coronary artery with sluggish flow and possible chronic layered thrombus distally, borderline significant ostial left circumflex stenosis, otherwise no evidence of obstructive coronary artery disease. Med rx recommended, EF 30-35% at that cath.  . Chronic shortness of breath   . CKD (chronic kidney disease), stage III ([place])   . Essential hypertension   . Memory loss   . NSVT (nonsustained ventricular tachycardia) ([name])    Negative EP study  . Orthostatic hypotension   . Paroxysmal atrial fibrillation ([name])   . Secondary cardiomyopathy ([place])    LVEF 40-45% January 2014  . Syncope   . TIA (transient ischemic attack)     Patient Active Problem List   Diagnosis Date Noted  . Acute on chronic combined systolic and diastolic CHF (congestive heart failure) ([place]) 10/14/2016  . PAF (paroxysmal atrial fibrillation) ([place]) 10/14/2016  . Acute respiratory failure with hypoxia ([place]) 10/14/2016    . CKD (chronic kidney disease) stage 3, GFR 30-59 ml/min ([name]) 10/14/2016  . Heme positive stool 08/06/2016  . Orthostatic hypotension 12/25/2014  . Right sided weakness 12/25/2014  . HLD (hyperlipidemia) 12/25/2014  . Hypotension 12/25/2014  . AKI (acute kidney injury) ([place]) 12/25/2014  . History of colonic polyps   . Rectal bleeding 09/03/2014  . Chronic anticoagulation 09/03/2014  . Chest tightness 08/13/2014  . Chest pain 08/13/2014  . Dizziness 08/13/2014  . Episodic lightheadedness   . NSVT (nonsustained ventricular tachycardia) ([place]) 08/05/2014  . Congestive dilated cardiomyopathy ([place]) 08/05/2014  . Syncope 08/05/2014  . Atrial fibrillation with RVR ([place]) 07/30/2014  . Secondary cardiomyopathy ([place]) 06/04/2014  . Essential hypertension 05/02/2013    Past Surgical History:  Procedure Laterality Date  . CARDIOVERSION N/A 10/19/2016   Procedure: CARDIOVERSION;  Surgeon: [name], MD;  [facility];  Service: Cardiovascular;  Laterality: N/A;  . CATARACT EXTRACTION    . COLONOSCOPY  unknown  . COLONOSCOPY N/A 09/05/2014   Procedure: COLONOSCOPY;  Surgeon: [name], MD;  Location: [facility] ENDO SUITE;  Service: Endoscopy;  Laterality: N/A;  145pm  . ELECTROPHYSIOLOGY STUDY N/A 08/05/2014   Procedure: ELECTROPHYSIOLOGY STUDY;  Surgeon: Deboraha Sprang, MD;  Location: [facility] CATH LAB;  Service: Cardiovascular;  Laterality: N/A;  . LAPAROSCOPIC APPENDECTOMY  November 2015   Dr. [name]  . LEFT HEART CATHETERIZATION WITH CORONARY ANGIOGRAM N/A 07/31/2014   Procedure: LEFT HEART CATHETERIZATION WITH CORONARY ANGIOGRAM;  Surgeon: [name], MD;  Location: [facility] CATH LAB;  Service: Cardiovascular;  Laterality: N/A;  . LOOP RECORDER IMPLANT    . LOOP RECORDER REMOVAL  11/09/2017   MDT LINQ loop recorder removed for end of battery life of the device by Dr Rayann Heman        Home Medications    Prior to Admission medications   Medication Sig Start Date End Date  Taking? Authorizing Provider  acetaminophen (TYLENOL) 500 MG tablet Take 1,000-1,500 mg by mouth every 6 (six) hours as needed for mild pain or moderate pain.    [provider]  amiodarone (PACERONE) 200 MG tablet TAKE (1/2) TABLET BY MOUTH DAILY. 08/15/17   Satira Sark, MD  furosemide (LASIX) 20 MG tablet Take 1 tablet (20 mg total) by mouth as needed (WEIGHT GAIN OF 2LBS OR MORE IN 24 HOURS). 02/01/17   Satira Sark, MD  levothyroxine (SYNTHROID, LEVOTHROID) 25 MCG tablet Take 25 mcg by mouth daily before breakfast.    [provider]  midodrine (PROAMATINE) 5 MG tablet TAKE 1 TABLET BY MOUTH 3 TIMES DAILY WITH MEALS. 09/01/17   Satira Sark, MD  Multiple Vitamins-Minerals (MULTIVITAMIN MEN 50+ PO) Take 1 tablet by mouth daily.    [provider]  XARELTO 15 MG TABS tablet TAKE 1 TABLET BY MOUTH DAILY WITH SUPPER. 03/28/17   Satira Sark, MD    Family History Family History  Problem Relation Age of Onset  . Heart disease Father        Diagnosed in his 64s  . Arthritis Father   . Alzheimer's disease Mother     Social History Social History   Tobacco Use  . Smoking status: Never Smoker  . Smokeless tobacco: Never Used  Substance Use Topics  . Alcohol use: No    Alcohol/week: 0.0 standard drinks  . Drug use: No     Allergies   Patient has no known allergies.   Review of Systems Review of Systems  Constitutional: Negative for fever.  HENT: Negative for congestion and sore throat.   Eyes: Negative.   Respiratory: Positive for shortness of breath.   Cardiovascular: Positive for chest pain. Negative for palpitations and leg swelling.  Gastrointestinal: Negative for abdominal distention, abdominal pain, nausea and vomiting.  Genitourinary: Negative.   Musculoskeletal: Negative for arthralgias, joint swelling and neck pain.  Skin: Negative.  Negative for rash and wound.  Neurological: Positive for weakness. Negative for  dizziness, light-headedness, numbness and headaches.  Psychiatric/Behavioral: Negative.      Physical Exam Updated Vital Signs BP 100/78   Pulse 63   Temp 98.1 F (36.7 C) (Oral)   Resp (!) 22   Ht 5\' 11"  (1.803 m)   Wt 83 kg   SpO2 95%   BMI 25.52 kg/m   Physical Exam  Constitutional: He appears well-developed and well-nourished.  HENT:  Head: Normocephalic and atraumatic.  Eyes: Conjunctivae are normal.  Neck: Normal range of motion.  Cardiovascular: Normal rate, regular rhythm, normal heart sounds and intact distal pulses.  Pulmonary/Chest: Effort normal. No accessory muscle usage. He has decreased breath sounds. He has no wheezes. He has rales in the right lower field.  Decreased breath sound right base. O2 saturation 88-90 on RA.  Abdominal: Soft. Bowel sounds are normal. There is no tenderness.  Musculoskeletal: Normal range of motion.  Neurological: He is alert.  Skin: Skin is warm and dry.  Psychiatric: He has a normal mood and affect.  Nursing note and vitals reviewed.    ED Treatments / Results  Labs (all labs ordered are listed, but only abnormal results are displayed) Labs Reviewed  CBC WITH DIFFERENTIAL/PLATELET - Abnormal; Notable for the following components:      Result Value   WBC 10.7 (*)    RBC 3.64 (*)    Hemoglobin 11.3 (*)    HCT 35.6 (*)    RDW 15.8 (*)    Monocytes Absolute 1.2 (*)    All other components within normal limits  PROTIME-INR - Abnormal; Notable for the following components:   Prothrombin Time 20.0 (*)    All other components within normal limits  APTT - Abnormal; Notable for the following components:   aPTT 44 (*)    All other components within normal limits  COMPREHENSIVE METABOLIC PANEL - Abnormal; Notable for the following components:   CO2 21 (*)    Glucose, Bld 132 (*)    Calcium 8.4 (*)    Albumin 3.2 (*)    Total Bilirubin 1.7 (*)    GFR calc non Af Amer 52 (*)    All other components within normal limits    TROPONIN I - Abnormal; Notable for the following components:   Troponin I 0.04 (*)    All other components within normal limits  BRAIN NATRIURETIC PEPTIDE - Abnormal; Notable for the following components:   B Natriuretic Peptide 1,122.0 (*)    All other components within normal limits    EKG EKG Interpretation  Date/Time:  Tuesday January 31 2018 10:05:26 EDT Ventricular Rate:  113 PR Interval:    QRS Duration: 115 QT Interval:  380 QTC Calculation: 463 R Axis:   -54 Text Interpretation:  Atrial fibrillation Ventricular bigeminy Incomplete left bundle branch block Confirmed by Elnora Morrison [phone]) on 01/31/2018 11:07:59 AM   Radiology Dg Chest Port 1 View  Result Date: 01/31/2018 CLINICAL DATA:  Progressive chest pain and shortness of breath. EXAM: PORTABLE CHEST 1 VIEW COMPARISON:  11/28/2017 and 10/14/2016 FINDINGS: Heart size and pulmonary vascularity are within normal limits considering the AP portable technique. No infiltrates or effusions. Interstitial markings are slightly accentuated on a chronic basis. No acute bone abnormality. IMPRESSION: No acute abnormalities. Slight chronic accentuation of the interstitial markings. Electronically Signed   By: Lorriane Shire M.D.   On: 01/31/2018 10:41    Procedures Procedures (including critical care time)  Medications Ordered in ED Medications  0.9 %  sodium chloride infusion ( Intravenous New Bag/Given 01/31/18 1041)  furosemide (LASIX) injection 80 mg (80 mg Intravenous Given 01/31/18 1125)     Initial Impression / Assessment and Plan / ED Course  I have reviewed the triage vital signs and the nursing notes.  Pertinent labs & imaging results that were available during my care of the patient were reviewed by me and considered in my medical decision making (see chart for details).     Pt with acute exacerbation of chf, troponin also elevated but is chronically so.  He was given lasix 80 mg IV here. Will need admission for  further tx.  Discussed with Dr. Manuella Ghazi who will see and admit pt.   Final Clinical Impressions(s) / ED Diagnoses   Final diagnoses:  Acute on chronic congestive heart failure, unspecified heart failure type ([place])  SOB (shortness of breath)    ED Discharge Orders    None       Landis Martins 01/31/18 1200    Elnora Morrison, MD 01/31/18 1234

## 2018-01-31 NOTE — ED Notes (Signed)
Report given to Val 300 RN at this time.

## 2018-01-31 NOTE — Progress Notes (Signed)
*  PRELIMINARY RESULTS* Echocardiogram 2D Echocardiogram has been performed.  Leavy Cella 01/31/2018, 3:49 PM

## 2018-01-31 NOTE — H&P (Addendum)
History and Physical    Alejandro [name] KZS:[id number] DOB: 1932/09/27 DOA: 01/31/2018  PCP: Glenda Chroman, MD   Patient coming from: Home  Chief Complaint: Worsening dyspnea  HPI: Alejandro [name] is a 84 y.o. male with medical history significant for CAD, CHF with LVEF of 40 to 45% and diffuse hypokinesis noted in 09/2016, CKD stage III, hypertension, PAF on Xarelto, and prior TIA who presented to the emergency department with worsening shortness of breath that began approximately 1 week ago.  He states that he has been taking his home Lasix and watching his diet as prescribed.  He has been monitoring his daily weights and states that his usual baseline weight is 168 pounds, but this has increased to 180 pounds.  He does not have any significant leg edema or orthopnea, but does tend to have some episodes of paroxysmal nocturnal dyspnea which awaken him at night.  He denies any chest pain, palpitations, nausea, vomiting, diaphoresis, cough, fever, or chills.  He recently was noted to visit the ED on 11/2017 with some dermatitis to his lower extremities and did take a prolonged course of prednisone which he had completed about a month ago.   ED Course: Vital signs are noted to be stable with some soft blood pressure readings.  He is noted to be mildly tachycardic and does have atrial fibrillation with rate of 100 to 110 bpm and EKG demonstrates the same with incomplete left bundle branch block.  1 view chest x-ray with no acute findings noted.  BNP of 1000 122.  Laboratory data with minimal leukocytosis of 10,700 and anemia with hemoglobin 11.3.  Creatinine is stable at 1.24.  Troponin noted to be 0.04.  He has just received a push of 80 mg of IV Lasix and has not diuresed yet.  He is currently on 2 L nasal cannula and does not wear home oxygen.  Review of Systems: All others reviewed and otherwise negative.  Past Medical History:  Diagnosis Date  . Alzheimer's dementia   . CAD (coronary artery disease)     a. ardiac cath in 07/2014 showed aneurysmal right coronary artery with sluggish flow and possible chronic layered thrombus distally, borderline significant ostial left circumflex stenosis, otherwise no evidence of obstructive coronary artery disease. Med rx recommended, EF 30-35% at that cath.  . Chronic shortness of breath   . CKD (chronic kidney disease), stage III ([place])   . Essential hypertension   . Memory loss   . NSVT (nonsustained ventricular tachycardia) ([name])    Negative EP study  . Orthostatic hypotension   . Paroxysmal atrial fibrillation ([name])   . Secondary cardiomyopathy ([place])    LVEF 40-45% January 2014  . Syncope   . TIA (transient ischemic attack)     Past Surgical History:  Procedure Laterality Date  . CARDIOVERSION N/A 10/19/2016   Procedure: CARDIOVERSION;  Surgeon: [name], MD;  [facility];  Service: Cardiovascular;  Laterality: N/A;  . CATARACT EXTRACTION    . COLONOSCOPY  unknown  . COLONOSCOPY N/A 09/05/2014   Procedure: COLONOSCOPY;  Surgeon: [name], MD;  Location[facility];  Service: Endoscopy;  Laterality: N/A;  145pm  . ELECTROPHYSIOLOGY STUDY N/A 08/05/2014   Procedure: ELECTROPHYSIOLOGY STUDY;  Surgeon: Deboraha Sprang, MD;  Location: [facility] CATH LAB;  Service: Cardiovascular;  Laterality: N/A;  . LAPAROSCOPIC APPENDECTOMY  November 2015   Dr. [name]  . LEFT HEART CATHETERIZATION WITH CORONARY ANGIOGRAM N/A 07/31/2014   Procedure:  LEFT HEART CATHETERIZATION WITH CORONARY ANGIOGRAM;  Surgeon: [name], MD;  Location: [facility] CATH LAB;  Service: Cardiovascular;  Laterality: N/A;  . LOOP RECORDER IMPLANT    . LOOP RECORDER REMOVAL  11/09/2017   MDT LINQ loop recorder removed for end of battery life of the device by Dr Rayann Heman     reports that he has never smoked. He has never used smokeless tobacco. He reports that he does not drink alcohol or use drugs.  No Known Allergies  Family History  Problem Relation Age of Onset   . Heart disease Father        Diagnosed in his 64s  . Arthritis Father   . Alzheimer's disease Mother     Prior to Admission medications   Medication Sig Start Date End Date Taking? Authorizing Provider  acetaminophen (TYLENOL) 500 MG tablet Take 1,000-1,500 mg by mouth every 6 (six) hours as needed for mild pain or moderate pain.    [provider]  amiodarone (PACERONE) 200 MG tablet TAKE (1/2) TABLET BY MOUTH DAILY. 08/15/17   Satira Sark, MD  furosemide (LASIX) 20 MG tablet Take 1 tablet (20 mg total) by mouth as needed (WEIGHT GAIN OF 2LBS OR MORE IN 24 HOURS). 02/01/17   Satira Sark, MD  levothyroxine (SYNTHROID, LEVOTHROID) 25 MCG tablet Take 25 mcg by mouth daily before breakfast.    [provider]  midodrine (PROAMATINE) 5 MG tablet TAKE 1 TABLET BY MOUTH 3 TIMES DAILY WITH MEALS. 09/01/17   Satira Sark, MD  Multiple Vitamins-Minerals (MULTIVITAMIN MEN 50+ PO) Take 1 tablet by mouth daily.    [provider]  XARELTO 15 MG TABS tablet TAKE 1 TABLET BY MOUTH DAILY WITH SUPPER. 03/28/17   Satira Sark, MD    Physical Exam: Vitals:   01/31/18 1030 01/31/18 1100 01/31/18 1130 01/31/18 1200  BP: 105/78 91/72 100/78 107/83  Pulse: (!) 53 (!) 37 63 91  Resp: 18 (!) 24 (!) 22 (!) 23  Temp:      TempSrc:      SpO2: 92% 92% 95% 92%  Weight:      Height:        Constitutional: NAD, calm, comfortable Vitals:   01/31/18 1030 01/31/18 1100 01/31/18 1130 01/31/18 1200  BP: 105/78 91/72 100/78 107/83  Pulse: (!) 53 (!) 37 63 91  Resp: 18 (!) 24 (!) 22 (!) 23  Temp:      TempSrc:      SpO2: 92% 92% 95% 92%  Weight:      Height:       Eyes: lids and conjunctivae normal ENMT: Mucous membranes are moist.  Neck: normal, supple Respiratory: clear to auscultation bilaterally. Normal respiratory effort. No accessory muscle use.  Currently on 2 L nasal cannula. Cardiovascular: Regular rate and rhythm, no murmurs. No extremity  edema. Abdomen: no tenderness, no distention. Bowel sounds positive.  Musculoskeletal:  No joint deformity upper and lower extremities.   Skin: no rashes, lesions, ulcers.  Psychiatric: Normal judgment and insight. Alert and oriented x 3. Normal mood.   Labs on Admission: I have personally reviewed following labs and imaging studies  CBC: Recent Labs  Lab 01/31/18 1027  WBC 10.7*  NEUTROABS 7.2  HGB 11.3*  HCT 35.6*  MCV 97.8  PLT 161   Basic Metabolic Panel: Recent Labs  Lab 01/31/18 1027  NA 138  K 3.5  CL 109  CO2 21*  GLUCOSE 132*  BUN 16  CREATININE  1.24  CALCIUM 8.4*   GFR: Estimated Creatinine Clearance: 48.1 mL/min (by C-G formula based on SCr of 1.24 mg/dL). Liver Function Tests: Recent Labs  Lab 01/31/18 1027  AST 23  ALT 10  ALKPHOS 44  BILITOT 1.7*  PROT 6.8  ALBUMIN 3.2*   No results for input(s): LIPASE, AMYLASE in the last 168 hours. No results for input(s): AMMONIA in the last 168 hours. Coagulation Profile: Recent Labs  Lab 01/31/18 1027  INR 1.72   Cardiac Enzymes: Recent Labs  Lab 01/31/18 1027  TROPONINI 0.04*   BNP (last 3 results) No results for input(s): PROBNP in the last 8760 hours. HbA1C: No results for input(s): HGBA1C in the last 72 hours. CBG: No results for input(s): GLUCAP in the last 168 hours. Lipid Profile: No results for input(s): CHOL, HDL, LDLCALC, TRIG, CHOLHDL, LDLDIRECT in the last 72 hours. Thyroid Function Tests: No results for input(s): TSH, T4TOTAL, FREET4, T3FREE, THYROIDAB in the last 72 hours. Anemia Panel: No results for input(s): VITAMINB12, FOLATE, FERRITIN, TIBC, IRON, RETICCTPCT in the last 72 hours. Urine analysis:    Component Value Date/Time   COLORURINE YELLOW 12/26/2014 0352   APPEARANCEUR CLEAR 12/26/2014 0352   LABSPEC 1.025 12/26/2014 0352   PHURINE 6.0 12/26/2014 0352   GLUCOSEU NEGATIVE 12/26/2014 0352   HGBUR NEGATIVE 12/26/2014 0352   BILIRUBINUR NEGATIVE 12/26/2014 0352    KETONESUR NEGATIVE 12/26/2014 0352   PROTEINUR NEGATIVE 12/26/2014 0352   UROBILINOGEN 0.2 12/26/2014 0352   NITRITE NEGATIVE 12/26/2014 0352   LEUKOCYTESUR SMALL (A) 12/26/2014 0352    Radiological Exams on Admission: Dg Chest Port 1 View  Result Date: 01/31/2018 CLINICAL DATA:  Progressive chest pain and shortness of breath. EXAM: PORTABLE CHEST 1 VIEW COMPARISON:  11/28/2017 and 10/14/2016 FINDINGS: Heart size and pulmonary vascularity are within normal limits considering the AP portable technique. No infiltrates or effusions. Interstitial markings are slightly accentuated on a chronic basis. No acute bone abnormality. IMPRESSION: No acute abnormalities. Slight chronic accentuation of the interstitial markings. Electronically Signed   By: Lorriane Shire M.D.   On: 01/31/2018 10:41    EKG: Independently reviewed. Atrial fib 113bpm, incomplete LBBB.  Assessment/Plan Principal Problem:   Acute respiratory failure with hypoxia ([name]) Active Problems:   Essential hypertension   Chronic anticoagulation   Acute on chronic combined systolic and diastolic CHF (congestive heart failure) ([name])   AF (paroxysmal atrial fibrillation) ([name])   CKD (chronic kidney disease) stage 3, GFR 30-59 ml/min ([name])   Hypothyroidism    1. Acute hypoxemic respiratory failure secondary to acute on chronic combined systolic and diastolic CHF.  IV Lasix 80 mg given in ED and awaiting response.  Will order 40 mg IV Lasix twice daily to start this evening.  Continue to monitor daily weights and strict input and output.  Repeat 2D echocardiogram as prior was in 09/2016 given significant dyspnea.  Wean oxygen as tolerated.  Limit fluid intake.  Monitor troponins.  Maintain on midodrine.  Consider cardiology evaluation as needed. 2. Paroxysmal atrial fibrillation.  He is currently in atrial fibrillation, but rate is controlled for this rhythm.  Continue on home Xarelto along with amiodarone. 3. Hypothyroidism.  Maintain on  levothyroxine and check TSH. 4. CKD stage III.  Appears to be at baseline and continue to monitor on repeat labs.   DVT prophylaxis: On Xarelto Code Status: Full Family Communication: Wife at bedside Disposition Plan:Admit for diuresis and 2D echo evaluation Consults called:None Admission status: Observation, Tele   Jhaden Pizzuto D  Manuella Ghazi DO [facility] Pager [phone]  If 7PM-7AM, please contact night-coverage [email or web address] Password [email or web address]  01/31/2018, 12:22 PM

## 2018-02-01 DIAGNOSIS — I10 Essential (primary) hypertension: Secondary | ICD-10-CM

## 2018-02-01 DIAGNOSIS — Z79890 Hormone replacement therapy (postmenopausal): Secondary | ICD-10-CM | POA: Diagnosis not present

## 2018-02-01 DIAGNOSIS — R0602 Shortness of breath: Secondary | ICD-10-CM | POA: Diagnosis not present

## 2018-02-01 DIAGNOSIS — I272 Pulmonary hypertension, unspecified: Secondary | ICD-10-CM | POA: Diagnosis present

## 2018-02-01 DIAGNOSIS — T462X5A Adverse effect of other antidysrhythmic drugs, initial encounter: Secondary | ICD-10-CM | POA: Diagnosis present

## 2018-02-01 DIAGNOSIS — Z8673 Personal history of transient ischemic attack (TIA), and cerebral infarction without residual deficits: Secondary | ICD-10-CM | POA: Diagnosis not present

## 2018-02-01 DIAGNOSIS — J704 Drug-induced interstitial lung disorders, unspecified: Secondary | ICD-10-CM | POA: Diagnosis present

## 2018-02-01 DIAGNOSIS — I480 Paroxysmal atrial fibrillation: Secondary | ICD-10-CM

## 2018-02-01 DIAGNOSIS — N183 Chronic kidney disease, stage 3 (moderate): Secondary | ICD-10-CM | POA: Diagnosis not present

## 2018-02-01 DIAGNOSIS — I130 Hypertensive heart and chronic kidney disease with heart failure and stage 1 through stage 4 chronic kidney disease, or unspecified chronic kidney disease: Secondary | ICD-10-CM | POA: Diagnosis present

## 2018-02-01 DIAGNOSIS — I5023 Acute on chronic systolic (congestive) heart failure: Secondary | ICD-10-CM | POA: Diagnosis not present

## 2018-02-01 DIAGNOSIS — I5043 Acute on chronic combined systolic (congestive) and diastolic (congestive) heart failure: Secondary | ICD-10-CM | POA: Diagnosis present

## 2018-02-01 DIAGNOSIS — N179 Acute kidney failure, unspecified: Secondary | ICD-10-CM | POA: Diagnosis not present

## 2018-02-01 DIAGNOSIS — E039 Hypothyroidism, unspecified: Secondary | ICD-10-CM

## 2018-02-01 DIAGNOSIS — J9601 Acute respiratory failure with hypoxia: Secondary | ICD-10-CM

## 2018-02-01 DIAGNOSIS — G309 Alzheimer's disease, unspecified: Secondary | ICD-10-CM | POA: Diagnosis present

## 2018-02-01 DIAGNOSIS — Z7901 Long term (current) use of anticoagulants: Secondary | ICD-10-CM

## 2018-02-01 DIAGNOSIS — R0609 Other forms of dyspnea: Secondary | ICD-10-CM | POA: Diagnosis not present

## 2018-02-01 DIAGNOSIS — F0280 Dementia in other diseases classified elsewhere without behavioral disturbance: Secondary | ICD-10-CM | POA: Diagnosis present

## 2018-02-01 DIAGNOSIS — I429 Cardiomyopathy, unspecified: Secondary | ICD-10-CM | POA: Diagnosis present

## 2018-02-01 DIAGNOSIS — I4891 Unspecified atrial fibrillation: Secondary | ICD-10-CM | POA: Diagnosis not present

## 2018-02-01 DIAGNOSIS — I951 Orthostatic hypotension: Secondary | ICD-10-CM | POA: Diagnosis present

## 2018-02-01 DIAGNOSIS — E785 Hyperlipidemia, unspecified: Secondary | ICD-10-CM | POA: Diagnosis present

## 2018-02-01 DIAGNOSIS — I2510 Atherosclerotic heart disease of native coronary artery without angina pectoris: Secondary | ICD-10-CM | POA: Diagnosis present

## 2018-02-01 DIAGNOSIS — J849 Interstitial pulmonary disease, unspecified: Secondary | ICD-10-CM | POA: Diagnosis not present

## 2018-02-01 LAB — CBC
HEMATOCRIT: 40.5 % (ref 39.0–52.0)
HEMOGLOBIN: 13.5 g/dL (ref 13.0–17.0)
MCH: 32.5 pg (ref 26.0–34.0)
MCHC: 33.3 g/dL (ref 30.0–36.0)
MCV: 97.6 fL (ref 78.0–100.0)
Platelets: 265 10*3/uL (ref 150–400)
RBC: 4.15 MIL/uL — ABNORMAL LOW (ref 4.22–5.81)
RDW: 15.7 % — ABNORMAL HIGH (ref 11.5–15.5)
WBC: 11.9 10*3/uL — AB (ref 4.0–10.5)

## 2018-02-01 LAB — BASIC METABOLIC PANEL
Anion gap: 10 (ref 5–15)
BUN: 24 mg/dL — AB (ref 8–23)
CHLORIDE: 102 mmol/L (ref 98–111)
CO2: 26 mmol/L (ref 22–32)
Calcium: 8.8 mg/dL — ABNORMAL LOW (ref 8.9–10.3)
Creatinine, Ser: 1.69 mg/dL — ABNORMAL HIGH (ref 0.61–1.24)
GFR calc Af Amer: 41 mL/min — ABNORMAL LOW (ref >60)
GFR, EST NON AFRICAN AMERICAN: 36 mL/min — AB (ref >60)
Glucose, Bld: 112 mg/dL — ABNORMAL HIGH (ref 70–99)
Potassium: 3.5 mmol/L (ref 3.5–5.1)
SODIUM: 138 mmol/L (ref 135–145)

## 2018-02-01 LAB — MAGNESIUM: MAGNESIUM: 2 mg/dL (ref 1.7–2.4)

## 2018-02-01 LAB — TROPONIN I: Troponin I: 0.05 ng/mL (ref <0.03)

## 2018-02-01 NOTE — Progress Notes (Signed)
PROGRESS NOTE    Alejandro [name]  BSW:[id number] DOB: 1932-09-29 DOA: 01/31/2018 PCP: Glenda Chroman, MD     Brief Narrative:  84 y.o. male with medical history significant for CAD, CHF with LVEF of 40 to 45% and diffuse hypokinesis noted in 09/2016, CKD stage III, hypertension, PAF on Xarelto, and prior TIA who presented to the emergency department with worsening shortness of breath that began approximately 1 week ago.  He states that he has been taking his home Lasix and watching his diet as prescribed.  He has been monitoring his daily weights and states that his usual baseline weight is 168 pounds, but this has increased to 180 pounds.  He does not have any significant leg edema or orthopnea, but does tend to have some episodes of paroxysmal nocturnal dyspnea which awaken him at night.  He denies any chest pain, palpitations, nausea, vomiting, diaphoresis, cough, fever, or chills.  He recently was noted to visit the ED on 11/2017 with some dermatitis to his lower extremities and did take a prolonged course of prednisone which he had completed about a month ago.   Assessment & Plan: 1-acute respiratory failure with hypoxia ([place]): In the setting of acute on chronic systolic heart failure. -Vascular congestion and elevated BNP appreciated on admission. -Improvement after IV diuresis initiated. -Still with fine crackles on examination, short of breath on exertion requiring oxygen supplementation. -Continue IV diuresis -Echo has demonstrated further decrease in his ejection fraction now down to 35% -No beta-blockers secondary to low heart rate -Will discuss with cardiology service. -No ACE/ARB with a history of renal failure and also low blood pressure.  2-Essential hypertension -Blood pressure is soft to low currently -Patient denies any symptoms -Follow vital signs -chronically using midodrine TID  3-Chronic anticoagulation -Continue Xarelto -No signs of acute upper bleeding.  4-AF  (paroxysmal atrial fibrillation) ([name]) -Rate controlled -Continue Xarelto for secondary prevention -Continue amiodarone.  5-CKD (chronic kidney disease) stage 3, GFR 30-59 ml/min ([name]) -Creatinine at baseline -Follow renal function trend closely while receiving IV diuresis.  6-Hypothyroidism -Continue Synthroid   DVT prophylaxis: Xarelto Code Status: Full code Family Communication: wife at Bedside Disposition Plan: Continue IV diuresis, follow electrolytes and replete and as needed, follow renal function closely.  Wean oxygen supplementation as tolerated.  Curbside cardiology service in the morning regarding decrease in his ejection fraction on repeat echo.    Consultants:   None   Procedures:   2D echo: - Left ventricle: The cavity size was normal. Wall thickness was   increased in a pattern of mild LVH. The estimated ejection   fraction was 35%. Diffuse hypokinesis. There is hypokinesis of   the basalinferolateral myocardium. The study was not technically   sufficient to allow evaluation of LV diastolic dysfunction due to   atrial fibrillation. - Aortic valve: Mildly calcified annulus. Trileaflet; mildly   calcified leaflets. There was trivial regurgitation. - Mitral valve: There was mild regurgitation. - Left atrium: The atrium was mildly dilated. - Right atrium: Central venous pressure (est): 3 mm Hg. - Atrial septum: No defect or patent foramen ovale was identified. - Tricuspid valve: There was trivial regurgitation. - Pulmonary arteries: Systolic pressure was moderately increased.   PA peak pressure: 50 mm Hg (S). - Pericardium, extracardiac: There was no pericardial effusion.  Antimicrobials:  Anti-infectives (From admission, onward)   None       Subjective: Afebrile, denies chest pain, no nausea, no vomiting.  Patient reports improvement in his breathing; even is  still short of breath with exertion and requiring oxygen supplementation.  Objective: Vitals:     02/01/18 1310 02/01/18 1317 02/01/18 1432 02/01/18 1700  BP: (!) 89/64 (!) 82/63 (!) 87/67 94/63  Pulse: (!) 51   (!) 58  Resp: 16     Temp: 98.6 F (37 C)     TempSrc: Oral     SpO2: 93%     Weight:      Height:        Intake/Output Summary (Last 24 hours) at 02/01/2018 1844 Last data filed at 02/01/2018 1700 Gross per 24 hour  Intake 935 ml  Output 450 ml  Net 485 ml   Filed Weights   01/31/18 1005 02/01/18 0500 02/01/18 0534  Weight: 83 kg 79.6 kg 79.4 kg    Examination: General exam: Alert, awake, oriented x 3.  Denies chest pain reports some improvement in his breathing.  Still experiencing shortness of breath with activity and is requiring 1-2 L nasal cannula oxygen supplementation. Respiratory system: Improved air movement, no wheezing, positive scattered rhonchi, fine crackles bibasilar. Cardiovascular system: Normal, no rubs, positive systolic ejection murmur, mild JVD. Gastrointestinal system: Abdomen is nondistended, soft and nontender. No organomegaly or masses felt. Normal bowel sounds heard. Central nervous system: Alert and oriented. No focal neurological deficits. Extremities: No cyanosis, no clubbing, no lower extremity edema.   Skin: No rashes, lesions or ulcers Psychiatry: Judgement and insight appear normal. Mood & affect appropriate.    Data Reviewed: I have personally reviewed following labs and imaging studies  CBC: Recent Labs  Lab 01/31/18 1027 02/01/18 0540  WBC 10.7* 11.9*  NEUTROABS 7.2  --   HGB 11.3* 13.5  HCT 35.6* 40.5  MCV 97.8 97.6  PLT 229 876   Basic Metabolic Panel: Recent Labs  Lab 01/31/18 1027 02/01/18 0540  NA 138 138  K 3.5 3.5  CL 109 102  CO2 21* 26  GLUCOSE 132* 112*  BUN 16 24*  CREATININE 1.24 1.69*  CALCIUM 8.4* 8.8*  MG  --  2.0   GFR: Estimated Creatinine Clearance: 35.3 mL/min (A) (by C-G formula based on SCr of 1.69 mg/dL (H)).   Liver Function Tests: Recent Labs  Lab 01/31/18 1027  AST 23  ALT  10  ALKPHOS 44  BILITOT 1.7*  PROT 6.8  ALBUMIN 3.2*   Coagulation Profile: Recent Labs  Lab 01/31/18 1027  INR 1.72   Cardiac Enzymes: Recent Labs  Lab 01/31/18 1027 01/31/18 1250 01/31/18 1844 02/01/18 0027  TROPONINI 0.04* 0.04* 0.04* 0.05*   Thyroid Function Tests: Recent Labs    01/31/18 1250  TSH 3.948   Urine analysis:    Component Value Date/Time   COLORURINE YELLOW 12/26/2014 0352   APPEARANCEUR CLEAR 12/26/2014 0352   LABSPEC 1.025 12/26/2014 0352   PHURINE 6.0 12/26/2014 0352   GLUCOSEU NEGATIVE 12/26/2014 0352   HGBUR NEGATIVE 12/26/2014 0352   BILIRUBINUR NEGATIVE 12/26/2014 0352   KETONESUR NEGATIVE 12/26/2014 0352   PROTEINUR NEGATIVE 12/26/2014 0352   UROBILINOGEN 0.2 12/26/2014 0352   NITRITE NEGATIVE 12/26/2014 0352   LEUKOCYTESUR SMALL (A) 12/26/2014 0352    Radiology Studies: Dg Chest Port 1 View  Result Date: 01/31/2018 CLINICAL DATA:  Progressive chest pain and shortness of breath. EXAM: PORTABLE CHEST 1 VIEW COMPARISON:  11/28/2017 and 10/14/2016 FINDINGS: Heart size and pulmonary vascularity are within normal limits considering the AP portable technique. No infiltrates or effusions. Interstitial markings are slightly accentuated on a chronic basis. No acute bone abnormality. IMPRESSION:  No acute abnormalities. Slight chronic accentuation of the interstitial markings. Electronically Signed   By: Lorriane Shire M.D.   On: 01/31/2018 10:41    Scheduled Meds: . amiodarone  100 mg Oral Daily  . furosemide  40 mg Intravenous Q12H  . levothyroxine  25 mcg Oral QAC breakfast  . midodrine  5 mg Oral TID WC  . multivitamin with minerals   Oral Daily  . Rivaroxaban  15 mg Oral Q supper  . sodium chloride flush  3 mL Intravenous Q12H   Continuous Infusions: . sodium chloride       LOS: 0 days    Time spent: 35 minutes. Greater than 50% of this time was spent in direct contact with the patient, coordinating care and discussing relevant  ongoing clinical issues, including need for IV diuretics, long discussion about sodium intake and daily weights. Family updated at bedside.     Barton Dubois, MD [facility] Pager [phone]  If 7PM-7AM, please contact night-coverage [email or web address] Password [facility] 02/01/2018, 6:44 PM

## 2018-02-02 ENCOUNTER — Encounter (HOSPITAL_COMMUNITY): Payer: Self-pay | Admitting: Physician Assistant

## 2018-02-02 DIAGNOSIS — I4891 Unspecified atrial fibrillation: Secondary | ICD-10-CM

## 2018-02-02 DIAGNOSIS — I5023 Acute on chronic systolic (congestive) heart failure: Secondary | ICD-10-CM

## 2018-02-02 DIAGNOSIS — R0609 Other forms of dyspnea: Secondary | ICD-10-CM

## 2018-02-02 LAB — SEDIMENTATION RATE: SED RATE: 68 mm/h — AB (ref 0–16)

## 2018-02-02 MED ORDER — MIDODRINE HCL 5 MG PO TABS
10.0000 mg | ORAL_TABLET | Freq: Three times a day (TID) | ORAL | Status: DC
  Administered 2018-02-03 – 2018-02-05 (×9): 10 mg via ORAL
  Filled 2018-02-02 (×9): qty 2

## 2018-02-02 MED ORDER — AMIODARONE HCL 200 MG PO TABS
200.0000 mg | ORAL_TABLET | Freq: Every day | ORAL | Status: DC
  Administered 2018-02-03: 200 mg via ORAL
  Filled 2018-02-02: qty 1

## 2018-02-02 NOTE — Progress Notes (Signed)
PROGRESS NOTE    Alejandro [name]  XVQ:[id number] DOB: 11-26-1932 DOA: 01/31/2018 PCP: Glenda Chroman, MD     Brief Narrative:  84 y.o. male with medical history significant for CAD, CHF with LVEF of 40 to 45% and diffuse hypokinesis noted in 09/2016, CKD stage III, hypertension, PAF on Xarelto, and prior TIA who presented to the emergency department with worsening shortness of breath that began approximately 1 week ago.  He states that he has been taking his home Lasix and watching his diet as prescribed.  He has been monitoring his daily weights and states that his usual baseline weight is 168 pounds, but this has increased to 180 pounds.  He does not have any significant leg edema or orthopnea, but does tend to have some episodes of paroxysmal nocturnal dyspnea which awaken him at night.  He denies any chest pain, palpitations, nausea, vomiting, diaphoresis, cough, fever, or chills.  He recently was noted to visit the ED on 11/2017 with some dermatitis to his lower extremities and did take a prolonged course of prednisone which he had completed about a month ago.   Assessment & Plan: 1-acute respiratory failure with hypoxia ([place]): In the setting of acute on chronic systolic heart failure. -Vascular congestion and elevated BNP appreciated on admission. -Still requiring oxygen supplementation with activity and experiencing dyspnea on exertion.  No crackles, no lower extremity swelling.   -now intravascular dry and with weight under baseline. Will stop diuresis -Echo has demonstrated further decrease in his ejection fraction down to 35% -No beta-blockers secondary to low BP -Will discuss with cardiology service. -No ACE/ARB with a history of renal failure and also low blood pressure.  2-Essential hypertension -Blood pressure remains soft to low. -Patient denies any dizziness today. -Follow vital signs -chronically using midodrine TID; dose adjusted to improved BP  3-Chronic  anticoagulation -Continue Xarelto -No signs of acute upper bleeding.  4-AF (paroxysmal atrial fibrillation) ([place]) -Rate controlled currently -Continue Xarelto for secondary prevention -Continue amiodarone; dose adjusted as per cardiology rec's.  5-CKD (chronic kidney disease) stage 3, GFR 30-59 ml/min ([name]) -Creatinine went up with diuresis, suggesting that he is intravascular dry now. -stop diuretics -follow renal function trend   6-Hypothyroidism -Continue Synthroid   DVT prophylaxis: Xarelto Code Status: Full code Family Communication: wife at Bedside Disposition Plan: Will stop diuresis.  follow electrolytes and replete them as needed, follow renal function closely.  Wean oxygen supplementation as tolerated.  Follow cardiology recommendations. Amiodarone dose adjusted.    Consultants:   Cardiology    Procedures:   2D echo: - Left ventricle: The cavity size was normal. Wall thickness was   increased in a pattern of mild LVH. The estimated ejection   fraction was 35%. Diffuse hypokinesis. There is hypokinesis of   the basalinferolateral myocardium. The study was not technically   sufficient to allow evaluation of LV diastolic dysfunction due to   atrial fibrillation. - Aortic valve: Mildly calcified annulus. Trileaflet; mildly   calcified leaflets. There was trivial regurgitation. - Mitral valve: There was mild regurgitation. - Left atrium: The atrium was mildly dilated. - Right atrium: Central venous pressure (est): 3 mm Hg. - Atrial septum: No defect or patent foramen ovale was identified. - Tricuspid valve: There was trivial regurgitation. - Pulmonary arteries: Systolic pressure was moderately increased.   PA peak pressure: 50 mm Hg (S). - Pericardium, extracardiac: There was no pericardial effusion.  Antimicrobials:  Anti-infectives (From admission, onward)   None  Subjective: Afebrile, denies chest pain, no nausea, no vomiting.  Patient reports  breathing is essentially back to baseline and currently denies any orthopnea.  He continues to have dyspnea on exertion and also oxygen desaturation on room air with activity.  While ambulating on room air his O2 sat drops to 86%.  Objective: Vitals:   02/01/18 2139 02/02/18 0647 02/02/18 0704 02/02/18 1524  BP: (!) 92/58 101/76 100/82 98/79  Pulse:  100 62 (!) 56  Resp:  16 17 20   Temp:   98.2 F (36.8 C) 98.5 F (36.9 C)  TempSrc:   Oral Oral  SpO2:  96% 95% 93%  Weight:   79.4 kg   Height:        Intake/Output Summary (Last 24 hours) at 02/02/2018 1736 Last data filed at 02/02/2018 1200 Gross per 24 hour  Intake 363 ml  Output 550 ml  Net -187 ml   Filed Weights   02/01/18 0500 02/01/18 0534 02/02/18 0704  Weight: 79.6 kg 79.4 kg 79.4 kg    Examination: General exam: Alert, awake, oriented x 3; patient denies chest pain and reports feeling much better overall.  Continued to have dyspnea on exertion and he is experiencing oxygen desaturation with activity on room air.  Using 2 L nasal cannula supplementation. Respiratory system: Improved from movement bilaterally, no wheezing, positive scattered rhonchi, no crackles. Cardiovascular system: Irregular, soft systolic ejection murmur appreciated on exam, no rubs, no gallops Gastrointestinal system: Abdomen is nondistended, soft and nontender. No organomegaly or masses felt. Normal bowel sounds heard. Central nervous system: Alert and oriented. No focal neurological deficits. Extremities: No C/C/E, +pedal pulses Skin: No rashes, lesions or ulcers Psychiatry: Judgement and insight appear normal. Mood & affect appropriate.    Data Reviewed: I have personally reviewed following labs and imaging studies  CBC: Recent Labs  Lab 01/31/18 1027 02/01/18 0540  WBC 10.7* 11.9*  NEUTROABS 7.2  --   HGB 11.3* 13.5  HCT 35.6* 40.5  MCV 97.8 97.6  PLT 229 060   Basic Metabolic Panel: Recent Labs  Lab 01/31/18 1027 02/01/18 0540   NA 138 138  K 3.5 3.5  CL 109 102  CO2 21* 26  GLUCOSE 132* 112*  BUN 16 24*  CREATININE 1.24 1.69*  CALCIUM 8.4* 8.8*  MG  --  2.0   GFR: Estimated Creatinine Clearance: 35.3 mL/min (A) (by C-G formula based on SCr of 1.69 mg/dL (H)).   Liver Function Tests: Recent Labs  Lab 01/31/18 1027  AST 23  ALT 10  ALKPHOS 44  BILITOT 1.7*  PROT 6.8  ALBUMIN 3.2*   Coagulation Profile: Recent Labs  Lab 01/31/18 1027  INR 1.72   Cardiac Enzymes: Recent Labs  Lab 01/31/18 1027 01/31/18 1250 01/31/18 1844 02/01/18 0027  TROPONINI 0.04* 0.04* 0.04* 0.05*   Thyroid Function Tests: Recent Labs    01/31/18 1250  TSH 3.948   Urine analysis:    Component Value Date/Time   COLORURINE YELLOW 12/26/2014 0352   APPEARANCEUR CLEAR 12/26/2014 0352   LABSPEC 1.025 12/26/2014 0352   PHURINE 6.0 12/26/2014 0352   GLUCOSEU NEGATIVE 12/26/2014 0352   HGBUR NEGATIVE 12/26/2014 0352   BILIRUBINUR NEGATIVE 12/26/2014 0352   KETONESUR NEGATIVE 12/26/2014 0352   PROTEINUR NEGATIVE 12/26/2014 0352   UROBILINOGEN 0.2 12/26/2014 0352   NITRITE NEGATIVE 12/26/2014 0352   LEUKOCYTESUR SMALL (A) 12/26/2014 0352    Radiology Studies: No results found.  Scheduled Meds: . [START ON 02/03/2018] amiodarone  200 mg Oral  Daily  . levothyroxine  25 mcg Oral QAC breakfast  . [START ON 02/03/2018] midodrine  10 mg Oral TID WC  . multivitamin with minerals   Oral Daily  . Rivaroxaban  15 mg Oral Q supper  . sodium chloride flush  3 mL Intravenous Q12H   Continuous Infusions: . sodium chloride       LOS: 1 day    Time spent: 30 minutes    Barton Dubois, MD [facility] Pager [phone]  If 7PM-7AM, please contact night-coverage [email or web address] Password [facility] 02/02/2018, 5:36 PM

## 2018-02-02 NOTE — Progress Notes (Signed)
SATURATION QUALIFICATIONS: (This note is used to comply with regulatory documentation for home oxygen)  Patient Saturations on Room Air at Rest  93%  Patient Saturations on Room Air while Ambulating = 86 %

## 2018-02-02 NOTE — Consult Note (Addendum)
Cardiology Consultation:   Patient ID: Alejandro [name]; [id number]; 1932-12-05   Admit date: 01/31/2018 Date of Consult: 02/02/2018  Primary Care Provider: Glenda Chroman, MD Primary Cardiologist: Alejandro Lesches, MD   Chief Complaint: SOB  Patient Profile:   Alejandro [name] is a 84 y.o. male with a hx of moderate CAD, probable dementia (per wife - she doesn't want patient to know), HTN, NSVT with negative EP study, PAF, CKD stage III (basleine Cr 1.2-1.3), TIA, syncope, NICM EF 40-45%, orthostatic hypotension, chronic dyspnea on exertion who is being seen today for the evaluation of CHF at the request of Dr. Dyann Kief.  History of Present Illness:   For recap, cath in 07/2014 showed aneurysmal right coronary artery with sluggish flow and possible chronic layered thrombus distally, borderline significant ostial left circumflex stenosis, otherwise no evidence of obstructive coronary artery disease. LVEF was 30-35% at that time. Medical therapy for coronary artery disease was recommended. The stenosis in the ostial left circumflex was not optimal for PCI due to heavy calcifications at the ostium of the LAD and having to jail a ramus branch. 2D Echo 09/2016 showed mild LVH, EF 40-45% with diffuse HK and HK of the basal mid-inferolateral myocardium, mild MR. Dr. Myles Gip notes have considered the cardiomyopathy non-ischemic and Dr. Jackalyn Lombard have called it ischemic. He's had a loop recorder in place for syncope, and has had diagnosis of atrial fib along the way and is on Xarelto and amiodarone. Last device check 11/09/17 showed 2.4% AF burden. Device was at [facility] at that time so it was removed. With regards to his cardiomyopathy, he has had medication intolerances/hypotension limiting regimen so Lasix is really the only med that has been used intermittently. I met him in 11/2017 following an episode of weakness where he had developed several days worth of inability to support his own weight. At that visit wife  privately informed me that her family has felt he has Alzheimer's but they were hesitant to inform him of their concerns because a family member had it and he's previously expressed strong negative feelings about how he would feel if he ever ended up having the disease. They were agreeable to refer him to neurology for formal workup. He did acknowledge some memory changes.  He [facility]2018 with complains of worsening SOB. He has chronic DOE but it progressed the point he thought he was going to collapse walking to the bathroom. Although he reported a dry weight of 168lb, OP weight when I saw him was 183lb in 11/2017 and 182lb when seen by Dr. Rayann Heman in 10/2017. He was 182 on admission - he has being diuresed with IV Lasix and put out -3.2L thus far with weight to 175lb. His BP drops intermittently low at times (down to 77/53), and he's also been hypoxic requiring O2 this admission. He denies any orthonpea, edema, or abdominal distention. Of note, pulse ox 90% at OV in 10/2017 and he was hypoxic during 09/2016 admission as well when VQ was low probability for PE. CXR no acute abnormalities. Labs showed BNP 1122, troponins are low/flat at 0.04-0.04-0.04-0.05, WBC mildly elevated to 11.9, Cr 1.24->1.69 with diuresis. He's been on amiodarone for several years. He denies any recent chest pain.  This admission he appears to be in persistent atrial fib but is relatively asymptomatic, HR 110s. He denies awareness. His breathing has improved with diuresis but he still feels unsteady.TSH wnl. 2D echo was checked 01/31/18 showing EF 35% with diffuse  HK but + hypokinesis of basal inferolateral myocardium, PASP moderately increased 91mHg.  Past Medical History:  Diagnosis Date  . Alzheimer's dementia   . CAD (coronary artery disease)    a. cardiac cath in 07/2014 showed aneurysmal right coronary artery with sluggish flow and possible chronic layered thrombus distally, borderline significant ostial  left circumflex stenosis, otherwise no evidence of obstructive coronary artery disease. Med rx recommended, EF 30-35% at that cath.  . Chronic shortness of breath   . CKD (chronic kidney disease), stage III [place]   . Essential hypertension   . Memory loss   . NSVT (nonsustained ventricular tachycardia) ([name])    Negative EP study  . Orthostatic hypotension   . Paroxysmal atrial fibrillation ([name])   . Secondary cardiomyopathy ([place]    LVEF 40-45% January 2014  . Syncope   . TIA (transient ischemic attack)     Past Surgical History:  Procedure Laterality Date  . CARDIOVERSION N/A 10/19/2016   Procedure: CARDIOVERSION;  Surgeon: KHerminio Commons MD;  Location[facility];  Service: Cardiovascular;  Laterality: N/A;  . CATARACT EXTRACTION    . COLONOSCOPY  unknown  . COLONOSCOPY N/A 09/05/2014   Procedure: COLONOSCOPY;  Surgeon: RDaneil Dolin MD;  Location: [facility];  Service: Endoscopy;  Laterality: N/A;  145pm  . ELECTROPHYSIOLOGY STUDY N/A 08/05/2014   Procedure: ELECTROPHYSIOLOGY STUDY;  Surgeon: SDeboraha Sprang MD;  [facility];  Service: Cardiovascular;  Laterality: N/A;  . LAPAROSCOPIC APPENDECTOMY  November 2015   Dr. DAnthony Sar . LEFT HEART CATHETERIZATION WITH CORONARY ANGIOGRAM N/A 07/31/2014   Procedure: LEFT HEART CATHETERIZATION WITH CORONARY ANGIOGRAM;  Surgeon: MWellington Hampshire MD;  [facility];  Service: Cardiovascular;  Laterality: N/A;  . LOOP RECORDER IMPLANT    . LOOP RECORDER REMOVAL  11/09/2017   MDT LINQ loop recorder removed for end of battery life of the device by Dr Allred     Inpatient Medications: Scheduled Meds: . amiodarone  100 mg Oral Daily  . furosemide  40 mg Intravenous Q12H  . levothyroxine  25 mcg Oral QAC breakfast  . midodrine  5 mg Oral TID WC  . multivitamin with minerals   Oral Daily  . Rivaroxaban  15 mg Oral Q supper  . sodium chloride flush  3 mL Intravenous Q12H   Continuous Infusions: . sodium chloride      PRN Meds: sodium chloride, acetaminophen **OR** acetaminophen, ondansetron **OR** ondansetron (ZOFRAN) IV, sodium chloride flush  Home Meds: Prior to Admission medications   Medication Sig Start Date End Date Taking? Authorizing Provider  amiodarone (PACERONE) 200 MG tablet TAKE (1/2) TABLET BY MOUTH DAILY. 08/15/17  Yes MSatira Sark MD  furosemide (LASIX) 20 MG tablet Take 1 tablet (20 mg total) by mouth as needed (WEIGHT GAIN OF 2LBS OR MORE IN 24 HOURS). 02/01/17  Yes MSatira Sark MD  levothyroxine (SYNTHROID, LEVOTHROID) 25 MCG tablet Take 25 mcg by mouth daily before breakfast.   Yes [provider]  midodrine (PROAMATINE) 5 MG tablet TAKE 1 TABLET BY MOUTH 3 TIMES DAILY WITH MEALS. 09/01/17  Yes MSatira Sark MD  Multiple Vitamins-Minerals (MULTIVITAMIN MEN 50+ PO) Take 1 tablet by mouth daily.   Yes [provider]  XARELTO 15 MG TABS tablet TAKE 1 TABLET BY MOUTH DAILY WITH SUPPER. 03/28/17  Yes MSatira Sark MD  acetaminophen (TYLENOL) 500 MG tablet Take 1,000-1,500 mg by mouth every 6 (six) hours as needed for mild  pain or moderate pain.    [provider]    Allergies:   No Known Allergies  Social History:   Social History   Socioeconomic History  . Marital status: Married    Spouse name: Not on file  . Number of children: Not on file  . Years of education: 64  . Highest education level: Not on file  Occupational History  . Occupation: Retired  Scientific laboratory technician  . Financial resource strain: Not on file  . Food insecurity:    Worry: Not on file    Inability: Not on file  . Transportation needs:    Medical: Not on file    Non-medical: Not on file  Tobacco Use  . Smoking status: Never Smoker  . Smokeless tobacco: Never Used  Substance and Sexual Activity  . Alcohol use: No    Alcohol/week: 0.0 standard drinks  . Drug use: No  . Sexual activity: Not on file  Lifestyle  . Physical activity:    Days per week: Not on  file    Minutes per session: Not on file  . Stress: Not on file  Relationships  . Social connections:    Talks on phone: Not on file    Gets together: Not on file    Attends religious service: Not on file    Active member of club or organization: Not on file    Attends meetings of clubs or organizations: Not on file    Relationship status: Not on file  . Intimate partner violence:    Fear of current or ex partner: Not on file    Emotionally abused: Not on file    Physically abused: Not on file    Forced sexual activity: Not on file  Other Topics Concern  . Not on file  Social History Narrative   Caffeine: 2 drinks a day     Family History:   The patient's family history includes Alzheimer's disease in his mother; Arthritis in his father; Heart disease in his father.  ROS:  Please see the history of present illness.  All other ROS reviewed and negative.     Physical Exam/Data:   Vitals:   02/01/18 2133 02/01/18 2139 02/02/18 0647 02/02/18 0704  BP: (!) 83/55 (!) 92/58 101/76 100/82  Pulse: 96  100 62  Resp: _0 Temp: 98.9 F (37.2 C)   98.2 F (36.8 C)  TempSrc: Oral   Oral  SpO2: 91%  96% 95%  Weight:    79.4 kg  Height:        Intake/Output Summary (Last 24 hours) at 02/02/2018 0913 Last data filed at 02/01/2018 2130 Gross per 24 hour  Intake 578 ml  Output 550 ml  Net 28 ml   Filed Weights   02/01/18 0500 02/01/18 0534 02/02/18 0704  Weight: 79.6 kg 79.4 kg 79.4 kg   Body mass index is 24.41 kg/m.  General: Frail appearing WM, in no acute distress. Head: Normocephalic, atraumatic, sclera non-icteric, no xanthomas, nares are without discharge. Neck: Negative for carotid bruits. JVD not elevated. Lungs: Clear bilaterally to auscultation without wheezes, rales, or rhonchi. Breathing is unlabored. Heart: RRR with S1 S2. No murmurs, rubs, or gallops appreciated. Abdomen: Soft, non-tender, non-distended with normoactive bowel sounds. No hepatomegaly. No  rebound/guarding. No obvious abdominal masses. Msk:  Strength and tone appear normal for age. Extremities: No clubbing or cyanosis. No edema.  Distal pedal pulses are 2+ and equal bilaterally. Neuro: Alert and oriented X  3. No facial asymmetry. No focal deficit. Moves all extremities spontaneously. Psych:  Responds to questions appropriately with a normal affect.  EKG:  The EKG was personally reviewed and demonstrates atrial fib ILBBB nonspecific changes, baseline wander  Laboratory Data:  Chemistry Recent Labs  Lab 01/31/18 1027 02/01/18 0540  NA 138 138  K 3.5 3.5  CL 109 102  CO2 21* 26  GLUCOSE 132* 112*  BUN 16 24*  CREATININE 1.24 1.69*  CALCIUM 8.4* 8.8*  GFRNONAA 52* 36*  GFRAA >60 41*  ANIONGAP 8 10    Recent Labs  Lab 01/31/18 1027  PROT 6.8  ALBUMIN 3.2*  AST 23  ALT 10  ALKPHOS 44  BILITOT 1.7*   Hematology Recent Labs  Lab 01/31/18 1027 02/01/18 0540  WBC 10.7* 11.9*  RBC 3.64* 4.15*  HGB 11.3* 13.5  HCT 35.6* 40.5  MCV 97.8 97.6  MCH 31.0 32.5  MCHC 31.7 33.3  RDW 15.8* 15.7*  PLT 229 265   Cardiac Enzymes Recent Labs  Lab 01/31/18 1027 01/31/18 1250 01/31/18 1844 02/01/18 0027  TROPONINI 0.04* 0.04* 0.04* 0.05*   No results for input(s): TROPIPOC in the last 168 hours.  BNP Recent Labs  Lab 01/31/18 1027  BNP 1,122.0*    DDimer No results for input(s): DDIMER in the last 168 hours.  Radiology/Studies:  Dg Chest Port 1 View  Result Date: 01/31/2018 CLINICAL DATA:  Progressive chest pain and shortness of breath. EXAM: PORTABLE CHEST 1 VIEW COMPARISON:  11/28/2017 and 10/14/2016 FINDINGS: Heart size and pulmonary vascularity are within normal limits considering the AP portable technique. No infiltrates or effusions. Interstitial markings are slightly accentuated on a chronic basis. No acute bone abnormality. IMPRESSION: No acute abnormalities. Slight chronic accentuation of the interstitial markings. Electronically Signed   By: Lorriane Shire M.D.   On: 01/31/2018 10:41    Assessment and Plan:    1. Dyspnea on exertion/unsteadiness 2. Acute recurrence of hypoxia 3. Acute on chronic systolic CHF with worsening LVEF 4. AKI on CKD stage III 5. Chronic orthostatic hypotension 6. Paroxysmal atrial fibrillation, on amiodarone (normal TSH this admission) 7. Moderate pulm HTN by echo  8. CAD in 2016 with minimally elevated troponins  Mr. Hanf presented with worsening SOB and unsteadiness. His SOB has improved somewhat with diuresis but he still notes DOE and is on [place] O2. I asked nurse to check O2 sat off oxygen and page me with result. CXR was relatively benign on admission and his lungs are clear this morning. Weight is now well below recent OP weights, and concordant with weights in the last 6-12 months. With diuresis, his Cr went from 1.24->1.69 along with rise in BUN suggesting there may be a concomitant process at play aside from just CHF causing his dyspnea. I suspect his rapid atrial fibrillation is playing a role but also question whether we need to consider amiodarone playing a role in intrinsic lung function. He is a nonsmoker without a history of COPD otherwise. His unsteadiness is likely related to his chronic hypotension, and consideration can be given to titrating his midodrine to 82m TID. His EF has dropped but upon further review has been as low as this in the past. I will review this complex patient's chart with Dr. RHarrington Challengerregarding further recommendations.  For questions or updates, please contact [place] VistaPlease consult [email or web address] for contact info under Cardiology/STEMI.    Signed, DCharlie Pitter PA-C  02/02/2018 9:13 AM   Pt seen and examined  I agree with findings of D Dunn above Pt is an 84 yo who is a poor historian Admitted with SOB and unsteadiness.  Hx of CAD, systolic CHF and PAF Since admit he has been diuresed  3.2 L   Breathing is OK   He still complains of unsteadiness with walking ,   Denies CP   Remains in atrial fibrillation    ON exam:  JVP is normal Lungs with occasional rale at L base Cardiac exam   Irreg Irreg   NO S3   No signif murmurs Ext are wihtout edema  1  Acute on chronic systolic CHF   Current volume status is not bad  Hold back on further diuresis given bump in Cr   Follow  Spoke to pt about salt in diet   Uses some   Family will need to review intake as well  2  Atrial fibrillatoin   Paroxysmal in past   NOw in it   Rates are borderline high   Will increase amiodarone to 200 daily for now   Continue Xarelto  3  Hypotension   I agree with increase in midodrine to 10 tid   Follow BP   4  Dyspnea, hypoxia   Pt  Needs PFTs  He is on amiodarone    Will check WESR  5  CAD   I am not convinced of active ischemia.    Will continue to follow.  Dorris Carnes

## 2018-02-02 NOTE — Care Management (Deleted)
Pt with CHF. Plan [facility]. CM discussed Reds vest technology which patient who is very interested in that after DC from SNF. [facility], Tim, given referral and will follow pt at [facility].

## 2018-02-03 ENCOUNTER — Inpatient Hospital Stay (HOSPITAL_COMMUNITY): Payer: Medicare Other

## 2018-02-03 LAB — BASIC METABOLIC PANEL
Anion gap: 11 (ref 5–15)
BUN: 40 mg/dL — AB (ref 8–23)
CHLORIDE: 102 mmol/L (ref 98–111)
CO2: 25 mmol/L (ref 22–32)
Calcium: 9.1 mg/dL (ref 8.9–10.3)
Creatinine, Ser: 1.69 mg/dL — ABNORMAL HIGH (ref 0.61–1.24)
GFR calc Af Amer: 41 mL/min — ABNORMAL LOW (ref >60)
GFR calc non Af Amer: 36 mL/min — ABNORMAL LOW (ref >60)
Glucose, Bld: 113 mg/dL — ABNORMAL HIGH (ref 70–99)
POTASSIUM: 3.5 mmol/L (ref 3.5–5.1)
Sodium: 138 mmol/L (ref 135–145)

## 2018-02-03 MED ORDER — METOPROLOL TARTRATE 25 MG PO TABS
12.5000 mg | ORAL_TABLET | Freq: Three times a day (TID) | ORAL | Status: DC
  Administered 2018-02-03 – 2018-02-04 (×3): 12.5 mg via ORAL
  Filled 2018-02-03 (×3): qty 1

## 2018-02-03 NOTE — Progress Notes (Signed)
PROGRESS NOTE    Alejandro [name]  BWL:[id number] DOB: 1932-12-27 DOA: 01/31/2018 PCP: Glenda Chroman, MD     Brief Narrative:  84 y.o. male with medical history significant for CAD, CHF with LVEF of 40 to 45% and diffuse hypokinesis noted in 09/2016, CKD stage III, hypertension, PAF on Xarelto, and prior TIA who presented to the emergency department with worsening shortness of breath that began approximately 1 week ago.  He states that he has been taking his home Lasix and watching his diet as prescribed.  He has been monitoring his daily weights and states that his usual baseline weight is 168 pounds, but this has increased to 180 pounds.  He does not have any significant leg edema or orthopnea, but does tend to have some episodes of paroxysmal nocturnal dyspnea which awaken him at night.  He denies any chest pain, palpitations, nausea, vomiting, diaphoresis, cough, fever, or chills.  He recently was noted to visit the ED on 11/2017 with some dermatitis to his lower extremities and did take a prolonged course of prednisone which he had completed about a month ago.   Assessment & Plan: 1-acute respiratory failure with hypoxia ([place]): In the setting of acute on chronic systolic heart failure. -Vascular congestion and elevated BNP appreciated on admission. -Still requiring oxygen supplementation with activity and experiencing dyspnea on exertion.  No crackles, no lower extremity swelling.   -now intravascular dry and with weight under baseline.  -continue to monitor him w/o diuretics. -Echo has demonstrated further decrease in his ejection fraction down to 35% -started on metoprolol TID (for rate controlled) -Will follow cardiology service rec's. -No ACE/ARB with a history of renal failure and also low blood pressure.  2-Essential hypertension -Blood pressure remains soft to low. -Patient denies any dizziness today. -Follow vital signs -chronically using midodrine TID; dose adjusted to 1m  TID. -continue follow VS.  3-Chronic anticoagulation -Continue Xarelto -No signs of acute overt bleeding.  4-AF (paroxysmal atrial fibrillation) ([name]) -rate is not controlled and irregular now. -with concerns for amiodarone toxicity, amiodarone has been stopped -patient started on metoprolol -elevated ESR seen and CT scan suggesting interstitial lung disease.  -Continue Xarelto for secondary prevention -follow on telemetry and follow cardiology rec's.   5-CKD (chronic kidney disease) stage 3, GFR 30-59 ml/min ([name]) -Creatinine went up with diuresis, suggesting that he is intravascular dry now. -Continue to hold diuresis. -follow renal function trend. -currently stable.  6-Hypothyroidism -Continue Synthroid   DVT prophylaxis: Xarelto Code Status: Full code Family Communication: wife at Bedside Disposition Plan: Continue to monitor patient without diuretics.  Follow electrolytes and replete them as needed, follow renal function closely.  Continue to Wean oxygen supplementation as tolerated.  Follow cardiology recommendations. Amiodarone stopped.    Consultants:   Cardiology    Procedures:   See below for x-ray reports.  2D echo: - Left ventricle: The cavity size was normal. Wall thickness was   increased in a pattern of mild LVH. The estimated ejection   fraction was 35%. Diffuse hypokinesis. There is hypokinesis of   the basalinferolateral myocardium. The study was not technically   sufficient to allow evaluation of LV diastolic dysfunction due to   atrial fibrillation. - Aortic valve: Mildly calcified annulus. Trileaflet; mildly   calcified leaflets. There was trivial regurgitation. - Mitral valve: There was mild regurgitation. - Left atrium: The atrium was mildly dilated. - Right atrium: Central venous pressure (est): 3 mm Hg. - Atrial septum: No defect or patent foramen ovale  was identified. - Tricuspid valve: There was trivial regurgitation. - Pulmonary  arteries: Systolic pressure was moderately increased.   PA peak pressure: 50 mm Hg (S). - Pericardium, extracardiac: There was no pericardial effusion.  Antimicrobials:  Anti-infectives (From admission, onward)   None       Subjective: No fever, no chest pain, no nausea, no vomiting.  Patient breathing has remained stable at rest; but he continues to experience dyspnea on exertion and is requiring oxygen supplementation with activity.  Objective: Vitals:   02/03/18 0539 02/03/18 1136 02/03/18 1520 02/03/18 1604  BP: 104/75 102/83 105/70   Pulse: (!) 55 (!) 101 (!) 46   Resp: 18  (!) 21   Temp: 98.2 F (36.8 C)  98 F (36.7 C)   TempSrc:   Oral   SpO2: 91% 92% 96% 92%  Weight:      Height:        Intake/Output Summary (Last 24 hours) at 02/03/2018 1626 Last data filed at 02/03/2018 0900 Gross per 24 hour  Intake 240 ml  Output 500 ml  Net -260 ml   Filed Weights   02/01/18 0534 02/02/18 0704 02/03/18 0500  Weight: 79.4 kg 79.4 kg 77.4 kg    Examination: General exam: Alert, awake, oriented x 3. Still requiring oxygen supplementation. Denies CP and reported breathing is stable. SOB and dyspnea on exertion reported. Respiratory system: no wheezing, no crackles. positive rhonchi.  Cardiovascular system: irregular.  Soft systolic ejection murmur appreciated , No rubs, no gallops.  No JVD.  Gastrointestinal system: Abdomen is nondistended, soft and nontender. No organomegaly or masses felt. Normal bowel sounds heard. Central nervous system: Alert and oriented. No focal neurological deficits. Extremities: No C/C/E, +pedal pulses Skin: No rashes, lesions or ulcers Psychiatry: Judgement and insight appear normal. Mood & affect appropriate.    Data Reviewed: I have personally reviewed following labs and imaging studies  CBC: Recent Labs  Lab 01/31/18 1027 02/01/18 0540  WBC 10.7* 11.9*  NEUTROABS 7.2  --   HGB 11.3* 13.5  HCT 35.6* 40.5  MCV 97.8 97.6  PLT 229 130    Basic Metabolic Panel: Recent Labs  Lab 01/31/18 1027 02/01/18 0540 02/03/18 0504  NA 138 138 138  K 3.5 3.5 3.5  CL 109 102 102  CO2 21* 26 25  GLUCOSE 132* 112* 113*  BUN 16 24* 40*  CREATININE 1.24 1.69* 1.69*  CALCIUM 8.4* 8.8* 9.1  MG  --  2.0  --    GFR: Estimated Creatinine Clearance: 35.3 mL/min (A) (by C-G formula based on SCr of 1.69 mg/dL (H)).   Liver Function Tests: Recent Labs  Lab 01/31/18 1027  AST 23  ALT 10  ALKPHOS 44  BILITOT 1.7*  PROT 6.8  ALBUMIN 3.2*   Coagulation Profile: Recent Labs  Lab 01/31/18 1027  INR 1.72   Cardiac Enzymes: Recent Labs  Lab 01/31/18 1027 01/31/18 1250 01/31/18 1844 02/01/18 0027  TROPONINI 0.04* 0.04* 0.04* 0.05*   Urine analysis:    Component Value Date/Time   COLORURINE YELLOW 12/26/2014 0352   APPEARANCEUR CLEAR 12/26/2014 0352   LABSPEC 1.025 12/26/2014 0352   PHURINE 6.0 12/26/2014 0352   GLUCOSEU NEGATIVE 12/26/2014 0352   HGBUR NEGATIVE 12/26/2014 0352   BILIRUBINUR NEGATIVE 12/26/2014 0352   KETONESUR NEGATIVE 12/26/2014 0352   PROTEINUR NEGATIVE 12/26/2014 0352   UROBILINOGEN 0.2 12/26/2014 0352   NITRITE NEGATIVE 12/26/2014 0352   LEUKOCYTESUR SMALL (A) 12/26/2014 0352    Radiology Studies: Ct Chest High  Resolution  Result Date: 02/03/2018 CLINICAL DATA:  84 year old male with history of chronic cough for the past several months. Desaturations. EXAM: CT CHEST WITHOUT CONTRAST TECHNIQUE: Multidetector CT imaging of the chest was performed following the standard protocol without intravenous contrast. High resolution imaging of the lungs, as well as inspiratory and expiratory imaging, was performed. COMPARISON:  Chest CT 07/20/2014. FINDINGS: Cardiovascular: Heart size is borderline enlarged. There is no significant pericardial fluid, thickening or pericardial calcification. There is aortic atherosclerosis, as well as atherosclerosis of the great vessels of the mediastinum and the coronary  arteries, including calcified atherosclerotic plaque in the left main, left anterior descending, left circumflex and right coronary arteries. Calcifications of the aortic valve. Mediastinum/Nodes: Multiple prominent borderline enlarged mediastinal and bilateral hilar lymph nodes, nonspecific. Esophagus is unremarkable in appearance. No axillary lymphadenopathy. Lungs/Pleura: Widespread areas of ground-glass attenuation and septal thickening are noted throughout the lungs bilaterally with a mild craniocaudal gradient. Some mild cylindrical bronchiectasis and peripheral bronchiolectasis. There is a suggestion of developing honeycombing, particularly in the medial aspect of the left lower lobe near the lung base. Inspiratory and expiratory imaging demonstrates some mild air trapping, indicative of mild small airways disease. No acute consolidative airspace disease. No pleural effusions. No definite suspicious appearing pulmonary nodules or masses. Calcified granuloma in the left upper lobe. Upper Abdomen: 2.7 cm exophytic low-attenuation lesion in the upper pole of the right kidney, incompletely characterized on today's noncontrast CT examination, but statistically likely to represent a cyst. Aortic atherosclerosis. Musculoskeletal: There are no aggressive appearing lytic or blastic lesions noted in the visualized portions of the skeleton. IMPRESSION: 1. The appearance of the lungs is compatible with interstitial lung disease, with a probable usual interstitial pneumonia (UIP) CT pattern. Repeat high-resolution chest CT is suggested in 12 months to assess for temporal changes in the appearance of the lung parenchyma. 2. Aortic atherosclerosis, in addition to left main and 3 vessel coronary artery disease. 3. There are calcifications of the aortic valve. Echocardiographic correlation for evaluation of potential valvular dysfunction may be warranted if clinically indicated. Aortic Atherosclerosis (ICD10-I70.0).  Electronically Signed   By: Vinnie Langton M.D.   On: 02/03/2018 13:32    Scheduled Meds: . levothyroxine  25 mcg Oral QAC breakfast  . metoprolol tartrate  12.5 mg Oral TID  . midodrine  10 mg Oral TID WC  . multivitamin with minerals   Oral Daily  . Rivaroxaban  15 mg Oral Q supper  . sodium chloride flush  3 mL Intravenous Q12H   Continuous Infusions: . sodium chloride       LOS: 2 days    Time spent: 30 minutes   Barton Dubois, MD [facility] Pager [phone]  If 7PM-7AM, please contact night-coverage [email or web address] Password [email or web address] 02/03/2018, 4:26 PM

## 2018-02-03 NOTE — Progress Notes (Signed)
CT reviewed with Dr. Harrington Challenger. Concern for interstitial lung disease. She plans to review scan with radiology. She recommends hold amiodarone for now. Dayna Dunn PA-C

## 2018-02-03 NOTE — Evaluation (Signed)
Physical Therapy Evaluation Patient Details Name: Alejandro [name] MRN: [id number] DOB: 04-08-1933 Today's Date: 02/03/2018   History of Present Illness  Alejandro [name] is a 84 y.o. male with medical history significant for CAD, CHF with LVEF of 40 to 45% and diffuse hypokinesis noted in 09/2016, CKD stage III, hypertension, PAF on Xarelto, and prior TIA who presented to the emergency department with worsening shortness of breath that began approximately 1 week ago.  He states that he has been taking his home Lasix and watching his diet as prescribed.  He has been monitoring his daily weights and states that his usual baseline weight is 168 pounds, but this has increased to 180 pounds.  He does not have any significant leg edema or orthopnea, but does tend to have some episodes of paroxysmal nocturnal dyspnea which awaken him at night.  He denies any chest pain, palpitations, nausea, vomiting, diaphoresis, cough, fever, or chills.  He recently was noted to visit the ED on 11/2017 with some dermatitis to his lower extremities and did take a prolonged course of prednisone which he had completed about a month ago.    Clinical Impression  Patient demonstrates slightly labored unsteady cadence when walking with O2 saturation dropping to 85% while on room air, had to be put on 4 LPM before (RN present and assisting) walking back to room, instructed in pursed lipped breathing with fair carryover and tolerated sitting up in chair after therapy with spouse present in room.  Patient will benefit from continued physical therapy in hospital and recommended venue below to increase strength, balance, endurance for safe ADLs and gait.    Follow Up Recommendations Home health PT;Supervision for mobility/OOB;Supervision - Intermittent    Equipment Recommendations  None recommended by PT    Recommendations for Other Services       Precautions / Restrictions Precautions Precautions: Fall Precaution Comments: monitor  O2 saturation Restrictions Weight Bearing Restrictions: No      Mobility  Bed Mobility Overal bed mobility: Modified Independent             General bed mobility comments: increased time  Transfers Overall transfer level: Needs assistance Equipment used: None;1 person hand held assist Transfers: Sit to/from Stand;Stand Pivot Transfers Sit to Stand: Min guard Stand pivot transfers: Min guard       General transfer comment: slow unsteady movement  Ambulation/Gait Ambulation/Gait assistance: Min guard Gait Distance (Feet): 60 Feet Assistive device: None;1 person hand held assist Gait Pattern/deviations: Decreased step length - right;Decreased step length - left;Decreased stride length Gait velocity: decreased   General Gait Details: demonstrates slow labored cadence with frequent standing rest breaks due to diffiuclty breathing with O2 saturation dropping to 85-87% on room air, able to maintain at 90% on 4 LPM of O2  Stairs            Wheelchair Mobility    Modified Rankin (Stroke Patients Only)       Balance Overall balance assessment: Needs assistance Sitting-balance support: Feet supported;No upper extremity supported Sitting balance-Leahy Scale: Good     Standing balance support: During functional activity;No upper extremity supported Standing balance-Leahy Scale: Poor Standing balance comment: fair/poor with hand held assist [facility])                             Pertinent Vitals/Pain      Home Living Family/patient expects to be discharged to:: Private residence Living Arrangements: Spouse/significant other Available Help  at Discharge: Family Type of Home: House Home Access: Stairs to enter Entrance Stairs-Rails: Left;Right;Can reach both Technical brewer of Steps: 3 Home Layout: Two level;Bed/bath upstairs Home Equipment: [place] - 2 wheels;Cane - single point      Prior Function Level of Independence: Independent          Comments: community ambulator, drives "per patient"     Hand Dominance   Dominant Hand: Right    Extremity/Trunk Assessment   Upper Extremity Assessment Upper Extremity Assessment: Generalized weakness    Lower Extremity Assessment Lower Extremity Assessment: Generalized weakness    Cervical / Trunk Assessment Cervical / Trunk Assessment: Normal  Communication   Communication: No difficulties  Cognition Arousal/Alertness: Awake/alert Behavior During Therapy: WFL for tasks assessed/performed Overall Cognitive Status: Within Functional Limits for tasks assessed                                        General Comments      Exercises     Assessment/Plan    PT Assessment Patient needs continued PT services  PT Problem List Decreased strength;Decreased balance;Decreased activity tolerance;Decreased mobility       PT Treatment Interventions Gait training;Stair training;Functional mobility training;Therapeutic activities;Therapeutic exercise;Patient/family education    PT Goals (Current goals can be found in the Care Plan section)  Acute Rehab PT Goals Patient Stated Goal: return home with family to assist PT Goal Formulation: With patient/family Time For Goal Achievement: 02/10/18 Potential to Achieve Goals: Good    Frequency Min 3X/week   Barriers to discharge        Co-evaluation               AM-PAC PT "6 Clicks" Daily Activity  Outcome Measure Difficulty turning over in bed (including adjusting bedclothes, sheets and blankets)?: None Difficulty moving from lying on back to sitting on the side of the bed? : None Difficulty sitting down on and standing up from a chair with arms (e.g., wheelchair, bedside commode, etc,.)?: A Little Help needed moving to and from a bed to chair (including a wheelchair)?: A Little Help needed walking in hospital room?: A Little Help needed climbing 3-5 steps with a railing? : A Lot 6 Click Score:  19    End of Session Equipment Utilized During Treatment: Oxygen Activity Tolerance: Patient tolerated treatment well;Patient limited by fatigue(limited by SOB) Patient left: in chair;with call bell/phone within reach Nurse Communication: Mobility status PT Visit Diagnosis: Unsteadiness on feet (R26.81);Other abnormalities of gait and mobility (R26.89);Muscle weakness (generalized) (M62.81)    Time: 5830-9407 PT Time Calculation (min) (ACUTE ONLY): 28 min   Charges:   PT Evaluation $PT Eval Moderate Complexity: 1 Mod PT Treatments $Therapeutic Activity: 23-37 mins        3:18 PM, 02/03/18 Lonell Grandchild, MPT Physical Therapist with [facility] [phone] office [phone] mobile phone

## 2018-02-03 NOTE — Progress Notes (Addendum)
Progress Note  Patient Name: Alejandro [name] Date of Encounter: 02/03/2018  Primary Cardiologist: Rozann Lesches, MD  Subjective   "I feel just fine." Desatted with ambulation yesterday to 86%. Remains in atrial fib, rates 100-110. No edema. Wife does mention he's had a hacky cough for a few months but they didn't think to mention it earlier because she's been treating him with OTC claritin.  Inpatient Medications    Scheduled Meds: . amiodarone  200 mg Oral Daily  . levothyroxine  25 mcg Oral QAC breakfast  . midodrine  10 mg Oral TID WC  . multivitamin with minerals   Oral Daily  . Rivaroxaban  15 mg Oral Q supper  . sodium chloride flush  3 mL Intravenous Q12H   Continuous Infusions: . sodium chloride     PRN Meds: sodium chloride, acetaminophen **OR** acetaminophen, ondansetron **OR** ondansetron (ZOFRAN) IV, sodium chloride flush   Vital Signs    Vitals:   02/02/18 1957 02/02/18 2206 02/03/18 0500 02/03/18 0539  BP:  107/72  104/75  Pulse:  (!) 54  (!) 55  Resp:  20  18  Temp:  98.1 F (36.7 C)  98.2 F (36.8 C)  TempSrc:  Oral    SpO2: 92% 94%  91%  Weight:   77.4 kg   Height:        Intake/Output Summary (Last 24 hours) at 02/03/2018 0901 Last data filed at 02/03/2018 0541 Gross per 24 hour  Intake 120 ml  Output 500 ml  Net -380 ml   Filed Weights   02/01/18 0534 02/02/18 0704 02/03/18 0500  Weight: 79.4 kg 79.4 kg 77.4 kg    Telemetry    Atrial fib 100-110 - Personally Reviewed  Physical Exam   GEN: No acute distress. Lying flat without acute dyspnea HEENT: Normocephalic, atraumatic, sclera non-icteric. Neck: No JVD or bruits. Cardiac: Irregularly irregular, rate mildly elevated, no murmurs, rubs, or gallops.  Radials/DP/PT 1+ and equal bilaterally.  Respiratory: Clear to auscultation bilaterally. Breathing is unlabored. GI: Soft, nontender, non-distended, BS +x 4. MS: no deformity. Extremities: No clubbing or cyanosis. No edema. Distal  pedal pulses are 2+ and equal bilaterally. Neuro:  AAOx3. Follows commands. Psych:  Responds to questions appropriately with a normal affect.  Labs    Chemistry Recent Labs  Lab 01/31/18 1027 02/01/18 0540 02/03/18 0504  NA 138 138 138  K 3.5 3.5 3.5  CL 109 102 102  CO2 21* 26 25  GLUCOSE 132* 112* 113*  BUN 16 24* 40*  CREATININE 1.24 1.69* 1.69*  CALCIUM 8.4* 8.8* 9.1  PROT 6.8  --   --   ALBUMIN 3.2*  --   --   AST 23  --   --   ALT 10  --   --   ALKPHOS 44  --   --   BILITOT 1.7*  --   --   GFRNONAA 52* 36* 36*  GFRAA >60 41* 41*  ANIONGAP 8 10 11      Hematology Recent Labs  Lab 01/31/18 1027 02/01/18 0540  WBC 10.7* 11.9*  RBC 3.64* 4.15*  HGB 11.3* 13.5  HCT 35.6* 40.5  MCV 97.8 97.6  MCH 31.0 32.5  MCHC 31.7 33.3  RDW 15.8* 15.7*  PLT 229 265    Cardiac Enzymes Recent Labs  Lab 01/31/18 1027 01/31/18 1250 01/31/18 1844 02/01/18 0027  TROPONINI 0.04* 0.04* 0.04* 0.05*   No results for input(s): TROPIPOC in the last 168 hours.   BNP  Recent Labs  Lab 01/31/18 1027  BNP 1,122.0*     DDimer No results for input(s): DDIMER in the last 168 hours.   Radiology    No results found.  Patient Profile     84 y.o. male with a hx of moderate CAD, probable dementia (per wife - she doesn't want patient to know),HTN, NSVT with negative EP study, PAF, CKD stage III (basleine Cr 1.2-1.3), TIA, syncope, NICM EF 40-45%, orthostatic hypotension, chronic dyspnea on exertion who is being seen today for the evaluation of CHF at the request of Dr. Dyann Kief.  Assessment & Plan    1. Acute recurrence of hypoxia, question multifactorial related to acute on chronic systolic CHF with mild decrease in EF, with question component of underlying lung disease - despite good diuresis and weight now below baseline, continues to have some hypoxia on ambulation. With elevated ESR and chronic amio use, concern for amio toxicity. Will obtain high res CT and PFTs. Consider  pulmonary evaluation today. Likely needs home O2.  2. AKI on CKD stage III - BUN/Cr remain elevated post-diuresis. No further Lasix at this time.  3. Chronic orthostatic hypotension - likely accounting for unsteadiness, appears stable.  MIdodrine was increased    4. Paroxysmal atrial fibrillation, on amiodarone (normal TSH this admission) - hold course for the moment until more info is know about #1 - question whether we can consider adding some low dose metoprolol, I.e. Toprol 12.6m XL daily. Very challenging situation given that he has hypotension (on midodrine) and is  not a good candidate for many other interventions/antiarrhythmics given his CAD/CHF/labile renal function. Alternative might be Tikosyn but has to be off amio x 3 months with undetectable level before this is entertained.  5. Moderate pulm HTN by echo - will eval lung disease above review with MD.  6. CAD in 2016 with minimally elevated troponins, not suggestive of ACS - no chest pain, not convinced of active ischemia.  For questions or updates, please contact CWilcoxPlease consult [email or web address] for contact info under Cardiology/STEMI.  Signed, DCharlie Pitter PA-C 02/03/2018, 9:01 AM    Pt seen and examined  I agree with findings of D DUnn above   I have amended note to reflect my findings \ Pt is comfortable in bed ON exam: HR 100s  Sats 91-94% on 2 L JVP is minimally elevated Lungs with fine rales at R base Cardiac exam Irreg irreg   NO S3  Crisp valve sounds Abd is benign Ext withoout edema  Labs signif for ESR of 68       1   Hypoxia.   Pt has diuresed since admit   Acute on chronic systolic / diastolic dysfunction in setting of recurrent afib with RVR  BOth could have exacerbated presentation   He has received IV lasix   I would not diurese further for now. Follow labs Despite diuresis he needs O2    Concerning with elevated  ESR and rales for amiodarone lung toxicity CT ordered    COntinue amiodarone  today   2  Atrial fibrillation   Back in June when he had ILR he had rare afib    Explanted at that time    NOw he is in afib and rates are not controlled   I would add metoprolol (12.5 bid)   Follow BP and HR   Titrate as needed / BP tolerates  (on midodrine as well) With amiodarone use and probable stopping, labile renal functon and  CAD options are limited    COnsider DCCV (with possible TEE) but ? If he would maintatin  SR  3  Hypotension   Follo on midodrine     Dorris Carnes

## 2018-02-03 NOTE — Care Management Important Message (Signed)
Important Message  Patient Details  Name: Alejandro [name] MRN: [id number] Date of Birth: 1932-12-06   Medicare Important Message Given:  Yes    Shelda Altes 02/03/2018, 11:42 AM

## 2018-02-03 NOTE — Plan of Care (Signed)
  Problem: Acute Rehab PT Goals(only PT should resolve) Goal: Patient Will Transfer Sit To/From Stand Outcome: Progressing Flowsheets (Taken 02/03/2018 1520) Patient will transfer sit to/from stand: with supervision Goal: Pt Will Transfer Bed To Chair/Chair To Bed Outcome: Progressing Flowsheets (Taken 02/03/2018 1520) Pt will Transfer Bed to Chair/Chair to Bed: with supervision Goal: Pt Will Ambulate Outcome: Progressing Flowsheets (Taken 02/03/2018 1520) Pt will Ambulate: 75 feet; with supervision; with cane; with rolling walker   3:20 PM, 02/03/18 Lonell Grandchild, MPT Physical Therapist with [facility] 336 [phone] office [phone] mobile phone

## 2018-02-03 NOTE — Care Management (Signed)
Pt from home with wife, ind with ADL's. Pt drives, has PCP, transportation and insurance with drug coverage. Pt qualifies for home O2, CM discussed getting that set up today in prep for potential DC home over weekend. Wife asked that we contact granddaughter to discuss DME provider. CM called granddaughter who requested we sent DME referral to the company she works for, this CM has never heard of this provider. Granddaughter was to get contact info for rep and call back to provide info. Granddaughter has not called back. PT has no recommended HH PT. Granddaughter did not think pt would need HH. This has no yet been discussed with patient.

## 2018-02-03 NOTE — Progress Notes (Signed)
SATURATION QUALIFICATIONS: (This note is used to comply with regulatory documentation for home oxygen)  Patient Saturations on Room Air at Rest =92%  Patient Saturations on Room Air while Ambulating =86%  Patient Saturations on 4 Liters of oxygen while Ambulating =92%  Please briefly explain why patient needs home oxygen:  Desats with ambualtion

## 2018-02-04 LAB — BASIC METABOLIC PANEL
ANION GAP: 10 (ref 5–15)
BUN: 37 mg/dL — AB (ref 8–23)
CALCIUM: 8.9 mg/dL (ref 8.9–10.3)
CO2: 24 mmol/L (ref 22–32)
Chloride: 103 mmol/L (ref 98–111)
Creatinine, Ser: 1.5 mg/dL — ABNORMAL HIGH (ref 0.61–1.24)
GFR calc Af Amer: 48 mL/min — ABNORMAL LOW (ref >60)
GFR, EST NON AFRICAN AMERICAN: 41 mL/min — AB (ref >60)
GLUCOSE: 111 mg/dL — AB (ref 70–99)
Potassium: 3.8 mmol/L (ref 3.5–5.1)
SODIUM: 137 mmol/L (ref 135–145)

## 2018-02-04 MED ORDER — METOPROLOL TARTRATE 25 MG PO TABS
25.0000 mg | ORAL_TABLET | Freq: Two times a day (BID) | ORAL | Status: DC
  Administered 2018-02-05: 25 mg via ORAL
  Filled 2018-02-04 (×2): qty 1

## 2018-02-04 NOTE — Progress Notes (Signed)
PROGRESS NOTE    Alejandro [name]  ZGY:[id number] DOB: 21-Oct-1932 DOA: 01/31/2018 PCP: Glenda Chroman, MD     Brief Narrative:  84 y.o. male with medical history significant for CAD, CHF with LVEF of 40 to 45% and diffuse hypokinesis noted in 09/2016, CKD stage III, hypertension, PAF on Xarelto, and prior TIA who presented to the emergency department with worsening shortness of breath that began approximately 1 week ago.  He states that he has been taking his home Lasix and watching his diet as prescribed.  He has been monitoring his daily weights and states that his usual baseline weight is 168 pounds, but this has increased to 180 pounds.  He does not have any significant leg edema or orthopnea, but does tend to have some episodes of paroxysmal nocturnal dyspnea which awaken him at night.  He denies any chest pain, palpitations, nausea, vomiting, diaphoresis, cough, fever, or chills.  He recently was noted to visit the ED on 11/2017 with some dermatitis to his lower extremities and did take a prolonged course of prednisone which he had completed about a month ago.   Assessment & Plan: 1-acute respiratory failure with hypoxia ([place]): In the setting of acute on chronic systolic heart failure. -Vascular congestion and elevated BNP appreciated on admission. -Still requiring oxygen supplementation with activity and experiencing dyspnea on exertion.  No crackles, no lower extremity swelling.   -now intravascular dry and with weight under baseline.  -continue to monitor him w/o diuretics. -Echo has demonstrated further decrease in his ejection fraction down to 35% -continue metoprolol; dose adjusted to BID; will follow HR and hopefully discharge home tomorrow. -Will follow cardiology service rec's. -No ACE/ARB with a history of renal failure and also low blood pressure. -patient will need O2 supplementation at discharge.   2-Essential hypertension -Blood pressure remains soft to low. -Patient denies  any dizziness today. -Follow vital signs -chronically using midodrine; dose adjusted to 25m TID. -continue follow VS.  3-Chronic anticoagulation -Continue Xarelto -No signs of acute overt bleeding.  4-AF (paroxysmal atrial fibrillation) ([name]) -rate is better controlled. -with concerns for amiodarone toxicity, amiodarone has been stopped -patient started on metoprolol; dose adjusted and changed to BID. Continue to monitor on telemetry; hopefully discharge tomorrow. 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  -Continue Xarelto for secondary prevention -follow on telemetry and follow cardiology rec's.  -will need O2 supplementation.   5-CKD (chronic kidney disease) stage 3, GFR 30-59 ml/min ([name]) -Creatinine went up with diuresis, suggesting that he is intravascular dry now. -Continue to hold diuresis. -Cr down to 1.5 -follow BMET in am  6-Hypothyroidism -Continue Synthroid   DVT prophylaxis: Xarelto Code Status: Full code Family Communication: wife at Bedside Disposition Plan: Continue to monitor patient without diuretics.  Follow electrolytes and replete them as needed, follow renal function closely.  Continue to Wean oxygen supplementation as tolerated.  Follow cardiology recommendations. continue adjusting metoprolol. Consultants:   Cardiology    Procedures:   See below for x-ray reports.  2D echo: - Left ventricle: The cavity size was normal. Wall thickness was   increased in a pattern of mild LVH. The estimated ejection   fraction was 35%. Diffuse hypokinesis. There is hypokinesis of   the basalinferolateral myocardium. The study was not technically   sufficient to allow evaluation of LV diastolic dysfunction due to   atrial fibrillation. - Aortic valve: Mildly calcified annulus. Trileaflet; mildly   calcified leaflets. There was trivial regurgitation. -  Mitral valve: There was mild regurgitation. - Left atrium:  The atrium was mildly dilated. - Right atrium: Central venous pressure (est): 3 mm Hg. - Atrial septum: No defect or patent foramen ovale was identified. - Tricuspid valve: There was trivial regurgitation. - Pulmonary arteries: Systolic pressure was moderately increased.   PA peak pressure: 50 mm Hg (S). - Pericardium, extracardiac: There was no pericardial effusion.  Antimicrobials:  Anti-infectives (From admission, onward)   None       Subjective: Afebrile, denies chest pain, no palpitations, no nausea, no vomiting.  Patient's breathing has remained stable especially at rest and is having good oxygen saturation with 2.5 L oxygen supplementation.  Still feeling winded and short of breath with activity.  Objective: Vitals:   02/03/18 1934 02/03/18 2219 02/04/18 0617 02/04/18 0629  BP:  98/74 (!) 89/65 98/60  Pulse:  (!) 47 (!) 56   Resp:  17 17   Temp:  98.2 F (36.8 C) 98.3 F (36.8 C)   TempSrc:  Oral Oral   SpO2: 92% 92% 94%   Weight:   79.2 kg   Height:        Intake/Output Summary (Last 24 hours) at 02/04/2018 1339 Last data filed at 02/04/2018 0830 Gross per 24 hour  Intake 420 ml  Output 400 ml  Net 20 ml   Filed Weights   02/02/18 0704 02/03/18 0500 02/04/18 0617  Weight: 79.4 kg 77.4 kg 79.2 kg    Examination: General exam: Alert, awake, oriented x 3. Breathing comfortable on oxygen supplementation. Denies CP and palpitations. Still with mild SOB on exertion. Telemetry reviewed and demonstrated atrial fibrillation with control HR for the most parts.    Respiratory system: no crackles, positive rhonchi, no wheezing.  Cardiovascular system:Rate controlled currently, soft systolic murmur appreciated on exam; no rubs, no gallops, no JVD. Gastrointestinal system: Abdomen is nondistended, soft and nontender. No organomegaly or masses felt. Normal bowel sounds heard. Central nervous system: Alert and oriented. No focal neurological deficits. Extremities: No C/C/E,  +pedal pulses Skin: No rashes, lesions or ulcers Psychiatry: Judgement and insight appear normal. Mood & affect appropriate.    Data Reviewed: I have personally reviewed following labs and imaging studies  CBC: Recent Labs  Lab 01/31/18 1027 02/01/18 0540  WBC 10.7* 11.9*  NEUTROABS 7.2  --   HGB 11.3* 13.5  HCT 35.6* 40.5  MCV 97.8 97.6  PLT 229 654   Basic Metabolic Panel: Recent Labs  Lab 01/31/18 1027 02/01/18 0540 02/03/18 0504 02/04/18 0843  NA 138 138 138 137  K 3.5 3.5 3.5 3.8  CL 109 102 102 103  CO2 21* 26 25 24   GLUCOSE 132* 112* 113* 111*  BUN 16 24* 40* 37*  CREATININE 1.24 1.69* 1.69* 1.50*  CALCIUM 8.4* 8.8* 9.1 8.9  MG  --  2.0  --   --    GFR: Estimated Creatinine Clearance: 39.7 mL/min (A) (by C-G formula based on SCr of 1.5 mg/dL (H)).   Liver Function Tests: Recent Labs  Lab 01/31/18 1027  AST 23  ALT 10  ALKPHOS 44  BILITOT 1.7*  PROT 6.8  ALBUMIN 3.2*   Coagulation Profile: Recent Labs  Lab 01/31/18 1027  INR 1.72   Cardiac Enzymes: Recent Labs  Lab 01/31/18 1027 01/31/18 1250 01/31/18 1844 02/01/18 0027  TROPONINI 0.04* 0.04* 0.04* 0.05*   Urine analysis:    Component Value Date/Time   COLORURINE YELLOW 12/26/2014 [place] 12/26/2014 0352   LABSPEC  1.025 12/26/2014 0352   PHURINE 6.0 12/26/2014 0352   GLUCOSEU NEGATIVE 12/26/2014 0352   HGBUR NEGATIVE 12/26/2014 0352   BILIRUBINUR NEGATIVE 12/26/2014 0352   KETONESUR NEGATIVE 12/26/2014 0352   PROTEINUR NEGATIVE 12/26/2014 0352   UROBILINOGEN 0.2 12/26/2014 0352   NITRITE NEGATIVE 12/26/2014 0352   LEUKOCYTESUR SMALL (A) 12/26/2014 0352    Radiology Studies: Ct Chest High Resolution  Result Date: 02/03/2018 CLINICAL DATA:  84 year old male with history of chronic cough for the past several months. Desaturations. EXAM: CT CHEST WITHOUT CONTRAST TECHNIQUE: Multidetector CT imaging of the chest was performed following the standard protocol without  intravenous contrast. High resolution imaging of the lungs, as well as inspiratory and expiratory imaging, was performed. COMPARISON:  Chest CT 07/20/2014. FINDINGS: Cardiovascular: Heart size is borderline enlarged. There is no significant pericardial fluid, thickening or pericardial calcification. There is aortic atherosclerosis, as well as atherosclerosis of the great vessels of the mediastinum and the coronary arteries, including calcified atherosclerotic plaque in the left main, left anterior descending, left circumflex and right coronary arteries. Calcifications of the aortic valve. Mediastinum/Nodes: Multiple prominent borderline enlarged mediastinal and bilateral hilar lymph nodes, nonspecific. Esophagus is unremarkable in appearance. No axillary lymphadenopathy. Lungs/Pleura: Widespread areas of ground-glass attenuation and septal thickening are noted throughout the lungs bilaterally with a mild craniocaudal gradient. Some mild cylindrical bronchiectasis and peripheral bronchiolectasis. There is a suggestion of developing honeycombing, particularly in the medial aspect of the left lower lobe near the lung base. Inspiratory and expiratory imaging demonstrates some mild air trapping, indicative of mild small airways disease. No acute consolidative airspace disease. No pleural effusions. No definite suspicious appearing pulmonary nodules or masses. Calcified granuloma in the left upper lobe. Upper Abdomen: 2.7 cm exophytic low-attenuation lesion in the upper pole of the right kidney, incompletely characterized on today's noncontrast CT examination, but statistically likely to represent a cyst. Aortic atherosclerosis. Musculoskeletal: There are no aggressive appearing lytic or blastic lesions noted in the visualized portions of the skeleton. IMPRESSION: 1. The appearance of the lungs is compatible with interstitial lung disease, with a probable usual interstitial pneumonia (UIP) CT pattern. Repeat  high-resolution chest CT is suggested in 12 months to assess for temporal changes in the appearance of the lung parenchyma. 2. Aortic atherosclerosis, in addition to left main and 3 vessel coronary artery disease. 3. There are calcifications of the aortic valve. Echocardiographic correlation for evaluation of potential valvular dysfunction may be warranted if clinically indicated. Aortic Atherosclerosis (ICD10-I70.0). Electronically Signed   By: Vinnie Langton M.D.   On: 02/03/2018 13:32    Scheduled Meds: . levothyroxine  25 mcg Oral QAC breakfast  . metoprolol tartrate  12.5 mg Oral TID  . midodrine  10 mg Oral TID WC  . multivitamin with minerals   Oral Daily  . Rivaroxaban  15 mg Oral Q supper  . sodium chloride flush  3 mL Intravenous Q12H   Continuous Infusions: . sodium chloride       LOS: 3 days    Time spent: 30 minutes   Barton Dubois, MD [facility] Pager [phone]  If 7PM-7AM, please contact night-coverage [email or web address] Password [email or web address] 02/04/2018, 1:39 PM

## 2018-02-04 NOTE — Progress Notes (Signed)
Patient's oxygen saturation on room air at rest is 93%,HR is 62. Patient's oxygen level on room air ambulating is 88% and HR is 90, when patient returned to bed oxygen level increased to 90% and 96% at rest with O2 at 2L via nasal cannula.

## 2018-02-04 NOTE — Progress Notes (Signed)
Returned call not received from [employer].  Dr Myna Hidalgo paged with return call received.  MD notified of ongoing hypotension.  Per MD Metoprolol evening dose held.

## 2018-02-05 DIAGNOSIS — N179 Acute kidney failure, unspecified: Secondary | ICD-10-CM

## 2018-02-05 LAB — BASIC METABOLIC PANEL
Anion gap: 9 (ref 5–15)
BUN: 34 mg/dL — ABNORMAL HIGH (ref 8–23)
CO2: 26 mmol/L (ref 22–32)
CREATININE: 1.4 mg/dL — AB (ref 0.61–1.24)
Calcium: 8.9 mg/dL (ref 8.9–10.3)
Chloride: 103 mmol/L (ref 98–111)
GFR, EST AFRICAN AMERICAN: 52 mL/min — AB (ref >60)
GFR, EST NON AFRICAN AMERICAN: 45 mL/min — AB (ref >60)
Glucose, Bld: 96 mg/dL (ref 70–99)
POTASSIUM: 3.7 mmol/L (ref 3.5–5.1)
SODIUM: 138 mmol/L (ref 135–145)

## 2018-02-05 LAB — MAGNESIUM: Magnesium: 2.4 mg/dL (ref 1.7–2.4)

## 2018-02-05 MED ORDER — MIDODRINE HCL 10 MG PO TABS: 10.0000 mg | ORAL_TABLET | Freq: Three times a day (TID) | ORAL | 2 refills | Status: DC

## 2018-02-05 MED ORDER — METOPROLOL TARTRATE 25 MG PO TABS: 25.0000 mg | ORAL_TABLET | Freq: Two times a day (BID) | ORAL | 1 refills | Status: DC

## 2018-02-05 MED ORDER — FUROSEMIDE 20 MG PO TABS: 20.0000 mg | ORAL_TABLET | ORAL | 3 refills | Status: DC | PRN

## 2018-02-05 NOTE — Progress Notes (Addendum)
PT d/c once advance brought home oxygen supplies. PT and wife escorted off unit to car. PT transported by nurse and wife via wheelchair. PT shows no s/s of respiratory distress, chest, pain or SOB. IV removed by day nurse. D/C instructions reviewed. PT belongings gathered with sent home with patient.

## 2018-02-05 NOTE — Progress Notes (Signed)
IV removed and discharge instructions reviewed.  Patient and wife understand that O2 will be delivered here before discharge.  Scripts given.  Wife at bedside

## 2018-02-05 NOTE — Discharge Summary (Signed)
Physician Discharge Summary  WESTLEY BLASS FBP:[id number] DOB: February 27, 1933 DOA: 01/31/2018  PCP: Glenda Chroman, MD  Admit date: 01/31/2018 Discharge date: 02/05/2018  Time spent: 35 minutes  Recommendations for Outpatient Follow-up:  1. Repeat BMET to follow electrolytes and renal function  2. Follow up with cardiologist/EP for further management of his A. Fib.    Discharge Diagnoses:  Principal Problem:   Acute respiratory failure with hypoxia ([name]) Active Problems:   Essential hypertension   Chronic anticoagulation   Acute on chronic combined systolic and diastolic CHF (congestive heart failure) ([name])   AF (paroxysmal atrial fibrillation) ([name])   CKD (chronic kidney disease) stage 3, GFR 30-59 ml/min ([name])   Hypothyroidism   CHF (congestive heart failure) ([name])   Acute on chronic systolic HF (heart failure) ([place])   Discharge Condition: stable and improved. Discharge home with instructions to follow up with PCP and cardiology service at discharge.  Diet recommendation: heart healthy/low sodium diet   Filed Weights   02/03/18 0500 02/04/18 0617 02/05/18 0500  Weight: 77.4 kg 79.2 kg 79.7 kg    History of present illness:  84 y.o.malewith medical history significant forCAD, CHF with LVEF of 40 to 45% and diffuse hypokinesis noted in 09/2016, CKD stage III, hypertension, PAF on Xarelto, and prior TIA who presented to the emergency department with worsening shortness of breath that began approximately 1 week ago. He states that he has been taking his home Lasix and watching his diet as prescribed. He has been monitoring his daily weights and states that his usual baseline weight is 168 pounds, but this has increased to 180 pounds. He does not have any significant leg edema or orthopnea, but does tend to have some episodes of paroxysmal nocturnal dyspnea which awaken him at night. He denies any chest pain, palpitations, nausea, vomiting, diaphoresis, cough, fever, or chills. He  recently was noted to visit the ED on 11/2017 with some dermatitis to his lower extremities and did take a prolonged course of prednisone which he had completed about a month ago.  Hospital Course:  1-acute respiratory failure with hypoxia [facility]): In the setting of acute on chronic systolic heart failure. -Vascular congestion and elevated BNP appreciated on admission. -Still requiring oxygen supplementation with activity and experiencing very little dyspnea on exertion.  No crackles, no lower extremity swelling.   -patient diuresed until below dry weight and Cr bumped. -discharge on PRN diuretics only. -Echo has demonstrated further decrease in his ejection fraction down to 35% -continue metoprolol; dose adjusted to BID. -Will follow up with cardiology service at discharge. -No ACE/ARB with a history of renal failure and also low blood pressure. -patient will need O2 supplementation at discharge.   2-Essential hypertension -Blood pressure remains soft to low. -Patient denies any dizziness today. -chronically has been using midodrine; dose adjusted to 33m TID. -follow up VS.  3-Chronic anticoagulation -Continue Xarelto -No signs of acute overt bleeding.  4-AF (paroxysmal atrial fibrillation) ([name]) -rate is better controlled. -with concerns for amiodarone toxicity, amiodarone has been stopped -patient started on metoprolol; dose adjusted and changed to BID. -HR stable so far tolerated -elevated ESR seen and CT scan suggesting interstitial lung disease from amiodarone toxicity; see below.  -Continue Xarelto for secondary prevention -follow up cardiology service and EP as an outpatient   5-mild acute renal injury in CKD (chronic kidney disease) stage 3, GFR 30-59 ml/min ([name]) -Creatinine went up with diuresis, suggesting that he was intravascular dry. -at discharge renal function back to his  baseline -Cr 1.4 -diuretics changed to PRN usage only  -please follow BMET at upcoming  visit to reassess electrolytes and renal function   6-Hypothyroidism -Continue Synthroid  7-hypoemia: with concerns for amiodarone toxicity  -elevated ESR and interstitial lung disease seen on CT scan. -amiodarone discontinued by cardiology service -patient discharge on oxygen supplementation -weaned oxygen slowly as tolerated and further determine needs for long term supplementation.   Procedures:  See below for x-ray reports.  2D echo: - Left ventricle: The cavity size was normal. Wall thickness was increased in a pattern of mild LVH. The estimated ejection fraction was 35%. Diffuse hypokinesis. There is hypokinesis of the basalinferolateral myocardium. The study was not technically sufficient to allow evaluation of LV diastolic dysfunction due to atrial fibrillation. - Aortic valve: Mildly calcified annulus. Trileaflet; mildly calcified leaflets. There was trivial regurgitation. - Mitral valve: There was mild regurgitation. - Left atrium: The atrium was mildly dilated. - Right atrium: Central venous pressure (est): 3 mm Hg. - Atrial septum: No defect or patent foramen ovale was identified. - Tricuspid valve: There was trivial regurgitation. - Pulmonary arteries: Systolic pressure was moderately increased. PA peak pressure: 50 mm Hg (S). - Pericardium, extracardiac: There was no pericardial effusion.  Consultations:  Cardiology   Discharge Exam: Vitals:   02/05/18 0647 02/05/18 1249  BP: (!) 148/113 94/68  Pulse: 85 66  Resp: 16   Temp:    SpO2: 94% 91%   General exam: Alert, awake, oriented x 3. Breathing comfortable on oxygen supplementation. Denies CP and palpitations. Reporting very little SOB on exertion. Telemetry reviewed and demonstrated atrial fibrillation with controlled HR.    Respiratory system: no crackles, positive rhonchi, no wheezing.  Cardiovascular system:Rate controlled currently, soft systolic murmur appreciated on exam; no rubs, no  gallops, no JVD. Gastrointestinal system: Abdomen is nondistended, soft and nontender. No organomegaly or masses felt. Normal bowel sounds heard. Central nervous system: Alert and oriented. No focal neurological deficits. Extremities: No C/C/E, +pedal pulses Skin: No rashes, lesions or ulcers Psychiatry: Judgement and insight appear normal. Mood & affect appropriate.     Discharge Instructions   Discharge Instructions    (HEART FAILURE PATIENTS) Call MD:  Anytime you have any of the following symptoms: 1) 3 pound weight gain in 24 hours or 5 pounds in 1 week 2) shortness of breath, with or without a dry hacking cough 3) swelling in the hands, feet or stomach 4) if you have to sleep on extra pillows at night in order to breathe.   Complete by:  As directed    Diet - low sodium heart healthy   Complete by:  As directed    Discharge instructions   Complete by:  As directed    Maintain adequate hydration Take medications as prescribed Stop use of amiodarone Wear oxygen 2.5 L 24/7 (mainly for exertion) Follow up with cardiology service in 1 week Arrange follow up with PCP in 2 weeks Check weight on daily basis and follow low sodium diet (< 2,000 mg sodium daily)     Allergies as of 02/05/2018   No Known Allergies     Medication List    STOP taking these medications   amiodarone 200 MG tablet Commonly known as:  PACERONE     TAKE these medications   acetaminophen 500 MG tablet Commonly known as:  TYLENOL Take 1,000-1,500 mg by mouth every 6 (six) hours as needed for mild pain or moderate pain.   furosemide 20 MG tablet Commonly known  as:  LASIX Take 1 tablet (20 mg total) by mouth as needed (WEIGHT GAIN OF 3 LBS OR MORE IN 24 HOURS). What changed:  reasons to take this   levothyroxine 25 MCG tablet Commonly known as:  SYNTHROID, LEVOTHROID Take 25 mcg by mouth daily before breakfast.   metoprolol tartrate 25 MG tablet Commonly known as:  LOPRESSOR Take 1 tablet (25 mg  total) by mouth 2 (two) times daily.   midodrine 10 MG tablet Commonly known as:  PROAMATINE Take 1 tablet (10 mg total) by mouth 3 (three) times daily with meals. What changed:    medication strength  See the new instructions.   MULTIVITAMIN MEN 50+ PO Take 1 tablet by mouth daily.   XARELTO 15 MG Tabs tablet Generic drug:  Rivaroxaban TAKE 1 TABLET BY MOUTH DAILY WITH SUPPER.            Durable Medical Equipment  (From admission, onward)         Start     Ordered   02/04/18 1338  For home use only DME oxygen  Once    Question Answer Comment  Mode or (Route) Nasal cannula   Liters per Minute 2.5   Frequency Continuous (stationary and portable oxygen unit needed)   Oxygen conserving device Yes   Oxygen delivery system Gas      02/04/18 1338         No Known Allergies Follow-up Information    [name], Dhruv B, MD. Schedule an appointment as soon as possible for a visit in 2 week(s).   Specialty:  Internal Medicine Contact information: [place] [address] [phone]        Satira Sark, MD. Schedule an appointment as soon as possible for a visit in 1 week(s).   Specialty:  Cardiology Contact information: [place] [address] [phone]        Thompson Grayer, MD .   Specialty:  Cardiology Contact information: [place] [address] [phone]           The results of significant diagnostics from this hospitalization (including imaging, microbiology, ancillary and laboratory) are listed below for reference.    Significant Diagnostic Studies: Ct Chest High Resolution  Result Date: 02/03/2018 CLINICAL DATA:  84 year old male with history of chronic cough for the past several months. Desaturations. EXAM: CT CHEST WITHOUT CONTRAST TECHNIQUE: Multidetector CT imaging of the chest was performed following the standard protocol without intravenous contrast. High resolution imaging of the  lungs, as well as inspiratory and expiratory imaging, was performed. COMPARISON:  Chest CT 07/20/2014. FINDINGS: Cardiovascular: Heart size is borderline enlarged. There is no significant pericardial fluid, thickening or pericardial calcification. There is aortic atherosclerosis, as well as atherosclerosis of the great vessels of the mediastinum and the coronary arteries, including calcified atherosclerotic plaque in the left main, left anterior descending, left circumflex and right coronary arteries. Calcifications of the aortic valve. Mediastinum/Nodes: Multiple prominent borderline enlarged mediastinal and bilateral hilar lymph nodes, nonspecific. Esophagus is unremarkable in appearance. No axillary lymphadenopathy. Lungs/Pleura: Widespread areas of ground-glass attenuation and septal thickening are noted throughout the lungs bilaterally with a mild craniocaudal gradient. Some mild cylindrical bronchiectasis and peripheral bronchiolectasis. There is a suggestion of developing honeycombing, particularly in the medial aspect of the left lower lobe near the lung base. Inspiratory and expiratory imaging demonstrates some mild air trapping, indicative of mild small airways disease. No acute consolidative airspace disease. No pleural  effusions. No definite suspicious appearing pulmonary nodules or masses. Calcified granuloma in the left upper lobe. Upper Abdomen: 2.7 cm exophytic low-attenuation lesion in the upper pole of the right kidney, incompletely characterized on today's noncontrast CT examination, but statistically likely to represent a cyst. Aortic atherosclerosis. Musculoskeletal: There are no aggressive appearing lytic or blastic lesions noted in the visualized portions of the skeleton. IMPRESSION: 1. The appearance of the lungs is compatible with interstitial lung disease, with a probable usual interstitial pneumonia (UIP) CT pattern. Repeat high-resolution chest CT is suggested in 12 months to assess for  temporal changes in the appearance of the lung parenchyma. 2. Aortic atherosclerosis, in addition to left main and 3 vessel coronary artery disease. 3. There are calcifications of the aortic valve. Echocardiographic correlation for evaluation of potential valvular dysfunction may be warranted if clinically indicated. Aortic Atherosclerosis (ICD10-I70.0). Electronically Signed   By: Vinnie Langton M.D.   On: 02/03/2018 13:32   Dg Chest Port 1 View  Result Date: 01/31/2018 CLINICAL DATA:  Progressive chest pain and shortness of breath. EXAM: PORTABLE CHEST 1 VIEW COMPARISON:  11/28/2017 and 10/14/2016 FINDINGS: Heart size and pulmonary vascularity are within normal limits considering the AP portable technique. No infiltrates or effusions. Interstitial markings are slightly accentuated on a chronic basis. No acute bone abnormality. IMPRESSION: No acute abnormalities. Slight chronic accentuation of the interstitial markings. Electronically Signed   By: Lorriane Shire M.D.   On: 01/31/2018 10:41    Microbiology: No results found for this or any previous visit (from the past 240 hour(s)).   Labs: Basic Metabolic Panel: Recent Labs  Lab 01/31/18 1027 02/01/18 0540 02/03/18 0504 02/04/18 0843 02/05/18 0614  NA 138 138 138 137 138  K 3.5 3.5 3.5 3.8 3.7  CL 109 102 102 103 103  CO2 21* _0 GLUCOSE 132* 112* 113* 111* 96  BUN 16 24* 40* 37* 34*  CREATININE 1.24 1.69* 1.69* 1.50* 1.40*  CALCIUM 8.4* 8.8* 9.1 8.9 8.9  MG  --  2.0  --   --  2.4   Liver Function Tests: Recent Labs  Lab 01/31/18 1027  AST 23  ALT 10  ALKPHOS 44  BILITOT 1.7*  PROT 6.8  ALBUMIN 3.2*   CBC: Recent Labs  Lab 01/31/18 1027 02/01/18 0540  WBC 10.7* 11.9*  NEUTROABS 7.2  --   HGB 11.3* 13.5  HCT 35.6* 40.5  MCV 97.8 97.6  PLT 229 265   Cardiac Enzymes: Recent Labs  Lab 01/31/18 1027 01/31/18 1250 01/31/18 1844 02/01/18 0027  TROPONINI 0.04* 0.04* 0.04* 0.05*   BNP: BNP (last 3  results) Recent Labs    01/31/18 1027  BNP 1,122.0*    Signed:  Barton Dubois MD.  [facility] 02/05/2018, 1:43 PM

## 2018-02-05 NOTE — Care Management (Addendum)
Spoke with patient's wife regarding oxygen provider.  Wife wants to let granddaughter arrange.  Wife will call me back when has talked to granddaughter.  I also attempted to call granddaughter with no answer.  1425: No answer at wife's number.  1445: Reached wife via phone.  She is unable to reach granddaughter and is agreeable to [facility] providing DME and HHRN.  Jermaine with [facility] contacted.  Oxygen will be delivered to pt's room prior to dc.

## 2018-02-05 NOTE — Progress Notes (Addendum)
SATURATION QUALIFICATIONS: (This note is used to comply with regulatory documentation for home oxygen)  Patient Saturations on Room Air at Rest = 92%  Patient Saturations on Room Air while Ambulating =86%  Patient Saturations on 2 Liters of oxygen while Ambulating = 96%  Please briefly explain why patient needs home oxygen:  DESATS WHILE AMBULATING

## 2018-02-06 DIAGNOSIS — I2510 Atherosclerotic heart disease of native coronary artery without angina pectoris: Secondary | ICD-10-CM | POA: Diagnosis not present

## 2018-02-06 DIAGNOSIS — I130 Hypertensive heart and chronic kidney disease with heart failure and stage 1 through stage 4 chronic kidney disease, or unspecified chronic kidney disease: Secondary | ICD-10-CM | POA: Diagnosis not present

## 2018-02-06 DIAGNOSIS — F0280 Dementia in other diseases classified elsewhere without behavioral disturbance: Secondary | ICD-10-CM | POA: Diagnosis not present

## 2018-02-06 DIAGNOSIS — Z9981 Dependence on supplemental oxygen: Secondary | ICD-10-CM | POA: Diagnosis not present

## 2018-02-06 DIAGNOSIS — G309 Alzheimer's disease, unspecified: Secondary | ICD-10-CM | POA: Diagnosis not present

## 2018-02-06 DIAGNOSIS — N183 Chronic kidney disease, stage 3 (moderate): Secondary | ICD-10-CM | POA: Diagnosis not present

## 2018-02-06 DIAGNOSIS — E039 Hypothyroidism, unspecified: Secondary | ICD-10-CM | POA: Diagnosis not present

## 2018-02-06 DIAGNOSIS — I480 Paroxysmal atrial fibrillation: Secondary | ICD-10-CM | POA: Diagnosis not present

## 2018-02-06 DIAGNOSIS — I5043 Acute on chronic combined systolic (congestive) and diastolic (congestive) heart failure: Secondary | ICD-10-CM | POA: Diagnosis not present

## 2018-02-06 DIAGNOSIS — I2729 Other secondary pulmonary hypertension: Secondary | ICD-10-CM | POA: Diagnosis not present

## 2018-02-06 DIAGNOSIS — I951 Orthostatic hypotension: Secondary | ICD-10-CM | POA: Diagnosis not present

## 2018-02-06 DIAGNOSIS — Z8673 Personal history of transient ischemic attack (TIA), and cerebral infarction without residual deficits: Secondary | ICD-10-CM | POA: Diagnosis not present

## 2018-02-08 DIAGNOSIS — I130 Hypertensive heart and chronic kidney disease with heart failure and stage 1 through stage 4 chronic kidney disease, or unspecified chronic kidney disease: Secondary | ICD-10-CM | POA: Diagnosis not present

## 2018-02-08 DIAGNOSIS — Z6827 Body mass index (BMI) 27.0-27.9, adult: Secondary | ICD-10-CM | POA: Diagnosis not present

## 2018-02-08 DIAGNOSIS — Z299 Encounter for prophylactic measures, unspecified: Secondary | ICD-10-CM | POA: Diagnosis not present

## 2018-02-08 DIAGNOSIS — I5043 Acute on chronic combined systolic (congestive) and diastolic (congestive) heart failure: Secondary | ICD-10-CM | POA: Diagnosis not present

## 2018-02-08 DIAGNOSIS — I10 Essential (primary) hypertension: Secondary | ICD-10-CM | POA: Diagnosis not present

## 2018-02-08 DIAGNOSIS — G309 Alzheimer's disease, unspecified: Secondary | ICD-10-CM | POA: Diagnosis not present

## 2018-02-08 DIAGNOSIS — I509 Heart failure, unspecified: Secondary | ICD-10-CM | POA: Diagnosis not present

## 2018-02-08 DIAGNOSIS — N183 Chronic kidney disease, stage 3 (moderate): Secondary | ICD-10-CM | POA: Diagnosis not present

## 2018-02-08 DIAGNOSIS — I480 Paroxysmal atrial fibrillation: Secondary | ICD-10-CM | POA: Diagnosis not present

## 2018-02-08 DIAGNOSIS — I2510 Atherosclerotic heart disease of native coronary artery without angina pectoris: Secondary | ICD-10-CM | POA: Diagnosis not present

## 2018-02-08 DIAGNOSIS — I429 Cardiomyopathy, unspecified: Secondary | ICD-10-CM | POA: Diagnosis not present

## 2018-02-14 DIAGNOSIS — N183 Chronic kidney disease, stage 3 (moderate): Secondary | ICD-10-CM | POA: Diagnosis not present

## 2018-02-14 DIAGNOSIS — I2510 Atherosclerotic heart disease of native coronary artery without angina pectoris: Secondary | ICD-10-CM | POA: Diagnosis not present

## 2018-02-14 DIAGNOSIS — I130 Hypertensive heart and chronic kidney disease with heart failure and stage 1 through stage 4 chronic kidney disease, or unspecified chronic kidney disease: Secondary | ICD-10-CM | POA: Diagnosis not present

## 2018-02-14 DIAGNOSIS — I5043 Acute on chronic combined systolic (congestive) and diastolic (congestive) heart failure: Secondary | ICD-10-CM | POA: Diagnosis not present

## 2018-02-14 DIAGNOSIS — I480 Paroxysmal atrial fibrillation: Secondary | ICD-10-CM | POA: Diagnosis not present

## 2018-02-14 DIAGNOSIS — G309 Alzheimer's disease, unspecified: Secondary | ICD-10-CM | POA: Diagnosis not present

## 2018-02-15 ENCOUNTER — Ambulatory Visit (INDEPENDENT_AMBULATORY_CARE_PROVIDER_SITE_OTHER): Payer: Medicare Other | Admitting: Neurology

## 2018-02-15 ENCOUNTER — Encounter: Payer: Self-pay | Admitting: Neurology

## 2018-02-15 VITALS — BP 113/72 | HR 42 | Ht 68.0 in | Wt 179.0 lb

## 2018-02-15 DIAGNOSIS — I639 Cerebral infarction, unspecified: Secondary | ICD-10-CM

## 2018-02-15 DIAGNOSIS — R413 Other amnesia: Secondary | ICD-10-CM

## 2018-02-15 NOTE — Progress Notes (Signed)
Subjective:    Patient ID: Alejandro [name] is a 84 y.o. [name].  HPI     Alejandro Age, MD, PhD [facility] [address] [place], [place] [address]  Dear Lisbeth Renshaw,   I saw your patient, Alejandro [name], upon your kind request, in my neurologic clinic today for initial consultation of his memory loss. The patient is accompanied by his wife today. As you know, Alejandro [name] is an 84 year old right handed gentleman with an underlying complex medical history of coronary artery disease, congestive heart failure, cardiomyopathy, history of TIA, history of syncope and orthostatic hypotension, nonsustained ventricular tachycardia, hypertension, chronic kidney disease, and paroxysmal A. fib, who reports that his memory is good and there is no issues, wife is indicating, that he has short-term memory issues, perhaps for the past few weeks or few months.  He was recently hospitalized this month for acute and chronic congestive heart failure with acute respiratory failure and hypoxia. He was hospitalized from 01/31/2018 through 02/05/2018. I reviewed the hospital records. I have previously met him about a year and a half ago at the request of his primary care physician for gait disorder and near syncopal episodes. I ordered a brain MRI at this time as well as an EEG. He did not have the EEG.  His brain MRI from 10/11/2016 was reviewed: IMPRESSION: No acute intracranial process.   Similar atrophy, and disproportionate midbrain and tectal volume loss is nonspecific though, can be seen with supranuclear palsy.   Old small anterior posterior circulation infarcts. Moderate chronic small vessel ischemic disease.    He was discharged on O2 24/7, 2.5 lpm currently, day and night. Memory loss per wife started a few months ago.  Patient felt, he had a spell of weakness, generalized, and improved after days. He feels, his memory is good for his [name]. 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 He does not smoke or drink alcohol. He does not drink water very much, sips at a time, maybe total of 2 cups per day, liked Dr. Malachi Bonds, but has reduced his soda intake, drinks sweet tea with meals, maybe 2 cups per day and one cup of juice in the morning, does not drink coffee. His wife reports that he is able to drive quite well, he drives well. He drives to [place]. He is not agreeable to my recommendation of stopping to drive. He does not report any memory loss. Wife feels that he has had some memory loss may be for the past few weeks or months. She is not sure. His mother had dementia or maybe Alzheimer's disease. She lived to be around 25. Father lived to be 64 years old or perhaps into his mid 15s, he is not sure. Father had a heart attack. He had one half-sister on father's side who died at the [name] of 45.  Previously:   10/05/16: Alejandro [name] is a 84 year old right-handed gentleman with an underlying medical history of reflux disease, coronary artery disease, hypertension, memory loss, paroxysmal A. fib, cardiomyopathy, history of TIA, history of stroke, history of syncope, who presented to the emergency room on 08/27/2016 with 3 episodes of near syncope. He also reported flare up of his reflux. I reviewed the emergency room records. Head CT without contrast was negative for any acute changes, chest x-ray also without acute findings and repeat cardiac enzymes were unremarkable. He did not have any significant orthostatic hypotension and he also was not symptomatic with position  changes.  I reviewed his head CT report from 08/27/2016: IMPRESSION: 1. No acute intracranial pathology seen on CT. 2. Moderate cortical volume loss and scattered small vessel ischemic microangiopathy. 3. Dense calcification along the left vertebral artery.  He had a brain MRI without contrast on 12/25/2014 which I reviewed: IMPRESSION: 1. No acute intracranial abnormality. 2. Expected evolution of left  occipital lobe infarct. 3. Stable atrophy and white matter disease. This likely reflects the sequela of chronic microvascular ischemia. 4. Mild anterior paranasal sinus disease without a fluid level.   I reviewed your office note from 09/23/2016, which you kindly included. He has been complaining of feeling dizzy when he bends over or stands up but denies sensation of room spinning. He was advised to start using a walker. He was also advised to stop driving. He was referred to cardiology as well. He was found to have A fib in 2016. He was recently started on Florinef 0.1 for [facility]. He feels that it causes him to be more tired, lethargic. He has no energy to even check his mail and when he comes back he feels strange and lightheaded. He does not exercise on a regular basis, feels that he cannot walk and becomes short of breath. They do have an exercise bike which he has not been using. He also does not drink enough water, wife reports that he likes to drink Dr. Malachi Bonds, 2 or 3 cans per day, hardly any water, just enough to take his medications. She denies any significant snoring or sleep disturbance in his case. He denies waking up with a sense of choking. After he was diagnosed with a stroke in 2016 he recovered quite well. He denies any one-sided weakness or numbness or slurring of speech or facial droop recently. He does not have a cane or walker. She states that he would not use one.   His Past Medical History Is Significant For: Past Medical History:  Diagnosis Date  . Alzheimer's dementia   . CAD (coronary artery disease)    a. 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 Med rx recommended, EF 30-35% at that cath.  . Chronic shortness of breath   . CKD (chronic kidney disease), stage III ([place])   .  Essential hypertension   . Memory loss   . NSVT (nonsustained ventricular tachycardia) ([name])    Negative EP study  . Orthostatic hypotension   . Paroxysmal atrial fibrillation ([name])   . Secondary cardiomyopathy ([place])    LVEF 40-45% January 2014  . Syncope   . TIA (transient ischemic attack)     His Past Surgical History Is Significant For: Past Surgical History:  Procedure Laterality Date  . CARDIOVERSION N/A 10/19/2016   Procedure: CARDIOVERSION;  Surgeon: [name], MD;  Location: [facility];  Service: Cardiovascular;  Laterality: N/A;  . CATARACT EXTRACTION    . COLONOSCOPY  unknown  . COLONOSCOPY N/A 09/05/2014   Procedure: COLONOSCOPY;  Surgeon: [name], MD;  Location: [facility];  Service: Endoscopy;  Laterality: N/A;  145pm  . ELECTROPHYSIOLOGY STUDY N/A 08/05/2014   Procedure: ELECTROPHYSIOLOGY STUDY;  Surgeon: Deboraha Sprang, MD;  Location: [facility] CATH LAB;  Service: Cardiovascular;  Laterality: N/A;  . LAPAROSCOPIC APPENDECTOMY  November 2015   Dr. [name]  . LEFT HEART CATHETERIZATION WITH CORONARY ANGIOGRAM N/A 07/31/2014   Procedure:  LEFT HEART CATHETERIZATION WITH CORONARY ANGIOGRAM;  Surgeon: [name], MD;  Location: [facility] CATH LAB;  Service: Cardiovascular;  Laterality: N/A;  . LOOP RECORDER IMPLANT    . LOOP RECORDER REMOVAL  11/09/2017   MDT LINQ loop recorder removed for end of battery life of the device by Dr Rayann Heman    His Family History Is Significant For: Family History  Problem Relation [name] of Onset  . Heart disease Father        Diagnosed in his 90s  . Arthritis Father   . Alzheimer's disease Mother     His Social History Is Significant For: Social History   Socioeconomic History  . Marital status: Married    Spouse name: Not on file  . Number of children: Not on file  . Years of education: 6  . Highest education level: Not on file  Occupational History  . Occupation: Retired  Scientific laboratory technician  . Financial resource strain:  Not on file  . Food insecurity:    Worry: Not on file    Inability: Not on file  . Transportation needs:    Medical: Not on file    Non-medical: Not on file  Tobacco Use  . Smoking status: Never Smoker  . Smokeless tobacco: Never Used  Substance and Sexual Activity  . Alcohol use: No    Alcohol/week: 0.0 standard drinks  . Drug use: No  . Sexual activity: Not on file  Lifestyle  . Physical activity:    Days per week: Not on file    Minutes per session: Not on file  . Stress: Not on file  Relationships  . Social connections:    Talks on phone: Not on file    Gets together: Not on file    Attends religious service: Not on file    Active member of club or organization: Not on file    Attends meetings of clubs or organizations: Not on file    Relationship status: Not on file  Other Topics Concern  . Not on file  Social History Narrative   Caffeine: 2 drinks a day     His Allergies Are:  No Known Allergies:   His Current Medications Are:  Outpatient Encounter Medications as of 02/15/2018  Medication Sig  . acetaminophen (TYLENOL) 500 MG tablet Take 1,000-1,500 mg by mouth every 6 (six) hours as needed for mild pain or moderate pain.  . furosemide (LASIX) 20 MG tablet Take 1 tablet (20 mg total) by mouth as needed (WEIGHT GAIN OF 3 LBS OR MORE IN 24 HOURS).  Marland Kitchen levothyroxine (SYNTHROID, LEVOTHROID) 25 MCG tablet Take 25 mcg by mouth daily before breakfast.  . metoprolol tartrate (LOPRESSOR) 25 MG tablet Take 1 tablet (25 mg total) by mouth 2 (two) times daily.  . midodrine (PROAMATINE) 5 MG tablet Take 5 mg by mouth 2 (two) times daily with a meal.   . Multiple Vitamins-Minerals (MULTIVITAMIN MEN 50+ PO) Take 1 tablet by mouth daily.  Alveda Reasons 15 MG TABS tablet TAKE 1 TABLET BY MOUTH DAILY WITH SUPPER.  . [DISCONTINUED] midodrine (PROAMATINE) 10 MG tablet Take 1 tablet (10 mg total) by mouth 3 (three) times daily with meals.   No facility-administered encounter medications  on file as of 02/15/2018.   :   Review of Systems:  Out of a complete 14 point review of systems, all are reviewed and negative with the exception of these symptoms as listed below:    Review of Systems  Neurological:  Pt presents today in follow up of his hospital stay. Pt reports that his weakness is improving. Pt reports that he is still able to drive well.    Objective:  Neurological Exam  Physical Exam Physical Examination:   Vitals:   02/15/18 0956  BP: 113/72  Pulse: (!) 42   General Examination: The patient is a very pleasant 84 y.o. [name] in no acute distress. He appears well-developed and well-nourished and well groomed. Minimally verbal, not very forthcoming with information, especially regarding memory loss. Wife reports very limited information.   HEENT: Normocephalic, atraumatic, pupils are equal, round and reactive to light and accommodation. He is status post cataract repairs. No nystagmus is noted. Hearing appears to be mildly impaired. Extraocular tracking is good. Face is symmetric with normal facial animation and normal facial sensation. O2 via [place], came with O2 tank. There is no hypophonia. There is no lip, neck/head, jaw or voice tremor. Neck shows FROM. Oropharynx exam reveals: mild to moderate mouth dryness, adequate dental hygiene. Tongue protrudes centrally and palate elevates symmetrically.   Chest: Clear to auscultation without wheezing, rhonchi or crackles noted.  Heart: S1+S2+0, regular and normal without murmurs, rubs or gallops noted.   Abdomen: Soft, non-tender and non-distended with normal bowel sounds appreciated on auscultation.  Extremities: There is no pitting edema in the distal lower extremities bilaterally.   Skin: Warm and dry without trophic changes noted.  Musculoskeletal: exam reveals no obvious joint deformities, tenderness or joint swelling or erythema.   Neurologically:  Mental status: The patient is awake, alert and  oriented in all 4 spheres. His immediate and remote memory, attention, language skills and fund of knowledge appear are impaired. Speech is scant, answers in few words. Mood is normal and affect is blunted.  On 02/15/2018: MMSE: 14/30, CDT: 1.5/4, AFT: 8/min.    Cranial nerves II - XII are as described above under HEENT exam. Motor exam: Globally thin bulk, global strength of 4+ out of 5, tone appears to be normal. He has no resting tremor, reflexes are 1+ throughout, except absent in the ankles. Fine motor skills are mildly impaired globally.  Cerebellar testing: No dysmetria or intention tremor on finger to nose testing. There is no truncal or gait ataxia.  Sensory exam: intact to light touch, vibration, and temperature sense in the upper and lower extremities.  Gait, station and balance: He stands with difficulty and pushes himself up. He stands slightly wide-based initially. He walks without assistance. Tandem walk is not testable for safety. He has no limp, he has preserved arm swing.  Assessment and Plan:   In summary, Alejandro [name] is a very pleasant 84 year old [name] with an underlying medical history of reflux disease, coronary artery disease, hypertension, recent hospitalization, history of TIA, history of stroke, history of syncope, who presents for evaluation of his memory loss. He reports a family history of dementia, mother may have had Alzheimer's disease. He has an abnormal memory score of 14 out of 30 today in our office. Unfortunately, more details are not available. He may have tried a memory medication in the past with his primary care physician and wife reports that he would not be able to take it again, she does not recall the name. He did not have an allergic reaction but did not like the way it made him feel after a few days so he stopped it. Given his memory dysfunction and abnormality noted with the copying of the intersecting figures and difficulty with clock  drawing, he likely  also has visuospatial dysfunction. He has multiple vascular risk factors, has changes on his brain scan in keeping with atherosclerosis and vascular changes as well as atrophy. He is at risk for vascular dementia. I would recommend pursuing neuropsychological testing and I recommended today that he no longer drives. He is not open to this possibility and states that he will continue to drive. He does not wish to pursue any further testing at this time and his wife reports that he would not be able to take any medication for memory loss although I explained to them that there may be other options if he tried only one medication in the past, she is encouraged to call their pharmacy to get the detailed on the prescription tried in the past, I will also copy his primary care physician today and my note. We talked about the importance of healthy lifestyle and good hydration as he does not drink water very much at all. He is encouraged to increase his water intake. At this juncture I would be happy to see him back on an as-needed basis, they are encouraged to call our office should he change his mind and decided to pursue neuropsychological evaluation with a neuropsychologist. Thank you very much for allowing me to participate in the care of this nice patient. If I can be of any further assistance to you please do not hesitate to call me at [phone].  Sincerely,   Alejandro Age, MD, PhD

## 2018-02-15 NOTE — Patient Instructions (Signed)
Your memory score in our office is not normal and likely more advanced for age. You feel, you have tried a medication for memory in the past, and did not like the way it made you feel. You may call your pharmacy to find out the name of the medication.   I would recommend we pursue a formal neuropsychological test (aka cognitive testing) for your memory complaints. This requires a referral to a trained and licensed neuropsychologist and will be a separate appointment at a different clinic. As you do not wish to go that route yet, I will hold off on the referral at this time. If you change your mind, I will be happy to place a referral and then also see you back. I will see you back as needed at this point.   I am worried about your driving skills, based on your history and your memory scores, I would recommend that you NOT drive any longer.  Please talk to Dr. Woody Seller about this issue as well.

## 2018-02-17 DIAGNOSIS — I5043 Acute on chronic combined systolic (congestive) and diastolic (congestive) heart failure: Secondary | ICD-10-CM | POA: Diagnosis not present

## 2018-02-17 DIAGNOSIS — N183 Chronic kidney disease, stage 3 (moderate): Secondary | ICD-10-CM | POA: Diagnosis not present

## 2018-02-17 DIAGNOSIS — I130 Hypertensive heart and chronic kidney disease with heart failure and stage 1 through stage 4 chronic kidney disease, or unspecified chronic kidney disease: Secondary | ICD-10-CM | POA: Diagnosis not present

## 2018-02-17 DIAGNOSIS — G309 Alzheimer's disease, unspecified: Secondary | ICD-10-CM | POA: Diagnosis not present

## 2018-02-17 DIAGNOSIS — I480 Paroxysmal atrial fibrillation: Secondary | ICD-10-CM | POA: Diagnosis not present

## 2018-02-17 DIAGNOSIS — I2510 Atherosclerotic heart disease of native coronary artery without angina pectoris: Secondary | ICD-10-CM | POA: Diagnosis not present

## 2018-02-22 DIAGNOSIS — N183 Chronic kidney disease, stage 3 (moderate): Secondary | ICD-10-CM | POA: Diagnosis not present

## 2018-02-22 DIAGNOSIS — I2510 Atherosclerotic heart disease of native coronary artery without angina pectoris: Secondary | ICD-10-CM | POA: Diagnosis not present

## 2018-02-22 DIAGNOSIS — I5043 Acute on chronic combined systolic (congestive) and diastolic (congestive) heart failure: Secondary | ICD-10-CM | POA: Diagnosis not present

## 2018-02-22 DIAGNOSIS — I480 Paroxysmal atrial fibrillation: Secondary | ICD-10-CM | POA: Diagnosis not present

## 2018-02-22 DIAGNOSIS — I130 Hypertensive heart and chronic kidney disease with heart failure and stage 1 through stage 4 chronic kidney disease, or unspecified chronic kidney disease: Secondary | ICD-10-CM | POA: Diagnosis not present

## 2018-02-22 DIAGNOSIS — G309 Alzheimer's disease, unspecified: Secondary | ICD-10-CM | POA: Diagnosis not present

## 2018-02-24 NOTE — Progress Notes (Signed)
Cardiology Office Note  Date: 02/27/2018   ID: Alejandro [name], DOB Oct 09, 1932, MRN [id number]  PCP: Glenda Chroman, MD  Primary Cardiologist: Rozann Lesches, MD   Chief Complaint  Patient presents with  . Cardiomyopathy    History of Present Illness: Alejandro [name] is an 84 y.o. male last seen by Ms. Dunn PA-C in July.  He was recently hospitalized in September with worsening shortness of breath and hypoxic respiratory failure with weight gain and evidence of acute on chronic combined heart failure.  Improved with IV diuresis and follow-up echocardiogram was obtained as detailed below.  He was also taken off amiodarone given concerns about toxicity with interstitial findings on chest CT and elevated ESR.  He is here today with his wife for a follow-up visit.  He states that he has been doing reasonably well.  He uses a walker, oxygen at nighttime and as needed during the daytime.  Weight has been stable since hospital discharge.  We reviewed his medications which are outlined below.  He has persistent atrial fibrillation, will likely managed with strategy of heart rate control and anticoagulation now that amiodarone has been discontinued.  Heart rate is adequately controlled today on Lopressor.  Past Medical History:  Diagnosis Date  . Alzheimer's dementia   . CAD (coronary artery disease)    a. 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 Med rx recommended, EF 30-35% at that cath.  . Chronic shortness of breath   . CKD (chronic kidney disease), stage III ([place])   . Essential hypertension   . Memory loss   . NSVT (nonsustained ventricular tachycardia) ([name])    Negative EP study  . Orthostatic hypotension   . Paroxysmal atrial fibrillation ([name])   . Secondary cardiomyopathy ([place])      LVEF 40-45% January 2014  . Syncope   . TIA (transient ischemic attack)     Past Surgical History:  Procedure Laterality Date  . CARDIOVERSION N/A 10/19/2016   Procedure: CARDIOVERSION;  Surgeon: [name], MD;  [facility];  Service: Cardiovascular;  Laterality: N/A;  . CATARACT EXTRACTION    . COLONOSCOPY  unknown  . COLONOSCOPY N/A 09/05/2014   Procedure: COLONOSCOPY;  Surgeon: [name], MD;  [facility]: [facility];  Service: Endoscopy;  Laterality: N/A;  145pm  . ELECTROPHYSIOLOGY STUDY N/A 08/05/2014   Procedure: ELECTROPHYSIOLOGY STUDY;  Surgeon: Deboraha Sprang, MD;  Location: [facility] CATH LAB;  Service: Cardiovascular;  Laterality: N/A;  . LAPAROSCOPIC APPENDECTOMY  November 2015   Dr. [name]  . LEFT HEART CATHETERIZATION WITH CORONARY ANGIOGRAM N/A 07/31/2014   Procedure: LEFT HEART CATHETERIZATION WITH CORONARY ANGIOGRAM;  Surgeon: [name], MD;  Location: [facility] CATH LAB;  Service: Cardiovascular;  Laterality: N/A;  . LOOP RECORDER IMPLANT    . LOOP RECORDER REMOVAL  11/09/2017   MDT LINQ loop recorder removed for end of battery life of the device by Dr Rayann Heman    Current Outpatient Medications  Medication Sig Dispense Refill  . acetaminophen (TYLENOL) 500 MG tablet Take 1,000-1,500 mg by mouth every 6 (six) hours as needed for mild pain or moderate pain.    . furosemide (LASIX) 20 MG tablet Take 1 tablet (20 mg total) by mouth as needed (WEIGHT GAIN OF 3 LBS OR MORE IN 24 HOURS). 30 tablet 3  .  levothyroxine (SYNTHROID, LEVOTHROID) 25 MCG tablet Take 25 mcg by mouth daily before breakfast.    . metoprolol tartrate (LOPRESSOR) 25 MG tablet Take 1 tablet (25 mg total) by mouth 2 (two) times daily. 60 tablet 1  . midodrine (PROAMATINE) 5 MG tablet Take 5 mg by mouth 2 (two) times daily with a meal.     . Multiple Vitamins-Minerals (MULTIVITAMIN MEN 50+ PO) Take 1 tablet by mouth daily.    Alveda Reasons 15 MG TABS tablet TAKE 1 TABLET BY MOUTH DAILY WITH  SUPPER. 30 tablet 6   No current facility-administered medications for this visit.    Allergies:  Patient has no known allergies.   Social History: The patient  reports that he has never smoked. He has never used smokeless tobacco. He reports that he does not drink alcohol or use drugs.   ROS:  Please see the history of present illness. Otherwise, complete review of systems is positive for memory and hearing loss.  All other systems are reviewed and negative.   Physical Exam: VS:  BP 90/64   Pulse 70   Ht _0  (1.727 m)   Wt 176 lb (79.8 kg)   SpO2 92%   BMI 26.76 kg/m , BMI Body mass index is 26.76 kg/m.  Wt Readings from Last 3 Encounters:  02/27/18 176 lb (79.8 kg)  02/15/18 179 lb (81.2 kg)  02/05/18 175 lb 11.3 oz (79.7 kg)    General: Elderly male, appears comfortable at rest. HEENT: Conjunctiva and lids normal, oropharynx clear. Neck: Supple, no elevated JVP or carotid bruits, no thyromegaly. Lungs: Clear to auscultation, nonlabored breathing at rest. Cardiac: Irregularly irregular, no S3, soft systolic murmur, no pericardial rub. Abdomen: Soft, nontender, bowel sounds present. Extremities: No pitting edema, distal pulses 2+. Skin: Warm and dry. Musculoskeletal: No kyphosis. Neuropsychiatric: Alert and oriented x3, affect grossly appropriate.  ECG: I personally reviewed the tracing from 01/31/2018 which showed atrial fibrillation with IVCD.  Recent Labwork: 01/31/2018: ALT 10; AST 23; B Natriuretic Peptide 1,122.0; TSH 3.948 02/01/2018: Hemoglobin 13.5; Platelets 265 02/05/2018: BUN 34; Creatinine, Ser 1.40; Magnesium 2.4; Potassium 3.7; Sodium 138     Component Value Date/Time   CHOL 138 08/03/2014 0304   TRIG 73 08/03/2014 0304   HDL 33 (L) 08/03/2014 0304   CHOLHDL 4.2 08/03/2014 0304   VLDL 15 08/03/2014 0304   LDLCALC 90 08/03/2014 0304    Other Studies Reviewed Today:  Chest CT 02/03/2018: IMPRESSION: 1. The appearance of the lungs is compatible with  interstitial lung disease, with a probable usual interstitial pneumonia (UIP) CT pattern. Repeat high-resolution chest CT is suggested in 12 months to assess for temporal changes in the appearance of the lung parenchyma. 2. Aortic atherosclerosis, in addition to left main and 3 vessel coronary artery disease. 3. There are calcifications of the aortic valve. Echocardiographic correlation for evaluation of potential valvular dysfunction may be warranted if clinically indicated.  Echocardiogram 01/31/2018: Study Conclusions  - Left ventricle: The cavity size was normal. Wall thickness was   increased in a pattern of mild LVH. The estimated ejection   fraction was 35%. Diffuse hypokinesis. There is hypokinesis of   the basalinferolateral myocardium. The study was not technically   sufficient to allow evaluation of LV diastolic dysfunction due to   atrial fibrillation. - Aortic valve: Mildly calcified annulus. Trileaflet; mildly   calcified leaflets. There was trivial regurgitation. - Mitral valve: There was mild regurgitation. - Left atrium: The atrium was mildly dilated. - Right  atrium: Central venous pressure (est): 3 mm Hg. - Atrial septum: No defect or patent foramen ovale was identified. - Tricuspid valve: There was trivial regurgitation. - Pulmonary arteries: Systolic pressure was moderately increased.   PA peak pressure: 50 mm Hg (S). - Pericardium, extracardiac: There was no pericardial effusion.  Assessment and Plan:  1.  Persistent atrial fibrillation.  We will likely manage with strategy of heart rate control and anticoagulation going forward.  Amiodarone was discontinued due to recent concerns about possible pulmonary toxicity.  Continue Xarelto for stroke prophylaxis and Lopressor.  2.  Nonischemic cardiomyopathy, LVEF 35% by recent assessment.  He has prior intolerances to ARB and ACE inhibitor with intermittent hypotension.  Continue beta-blocker and Lasix for fluid  control.  Managing this conservatively.  3.  History of syncope.  He had an ILR in place that was explanted by Dr. Rayann Heman with battery at end of service.  No recent episodes.  4.  Orthostatic hypotension, continues on midodrine.  Current medicines were reviewed with the patient today.   Orders Placed This Encounter  Procedures  . Basic metabolic panel     Disposition: Follow-up in 6 to 8 weeks.   Signed, Satira Sark, MD, [facility] 02/27/2018 10:10 AM    [facility] [address], [place], [place] [address] Phone: [phone]; Fax: [phone]

## 2018-02-27 ENCOUNTER — Encounter: Payer: Self-pay | Admitting: Cardiology

## 2018-02-27 ENCOUNTER — Ambulatory Visit (INDEPENDENT_AMBULATORY_CARE_PROVIDER_SITE_OTHER): Payer: Medicare Other | Admitting: Cardiology

## 2018-02-27 VITALS — BP 90/64 | HR 70 | Ht 68.0 in | Wt 176.0 lb

## 2018-02-27 DIAGNOSIS — I639 Cerebral infarction, unspecified: Secondary | ICD-10-CM | POA: Diagnosis not present

## 2018-02-27 DIAGNOSIS — I481 Persistent atrial fibrillation: Secondary | ICD-10-CM

## 2018-02-27 DIAGNOSIS — I428 Other cardiomyopathies: Secondary | ICD-10-CM | POA: Diagnosis not present

## 2018-02-27 DIAGNOSIS — Z87898 Personal history of other specified conditions: Secondary | ICD-10-CM

## 2018-02-27 DIAGNOSIS — I951 Orthostatic hypotension: Secondary | ICD-10-CM | POA: Diagnosis not present

## 2018-02-27 DIAGNOSIS — I4819 Other persistent atrial fibrillation: Secondary | ICD-10-CM

## 2018-02-27 NOTE — Patient Instructions (Signed)
Medication Instructions:  Continue all current medications.  Labwork:  BMET - do just prior to next office visit.  Order given today.   Testing/Procedures: none  Follow-Up: 6-8 weeks   Any Other Special Instructions Will Be Listed Below (If Applicable).  If you need a refill on your cardiac medications before your next appointment, please call your pharmacy.

## 2018-02-28 DIAGNOSIS — I5043 Acute on chronic combined systolic (congestive) and diastolic (congestive) heart failure: Secondary | ICD-10-CM | POA: Diagnosis not present

## 2018-02-28 DIAGNOSIS — I130 Hypertensive heart and chronic kidney disease with heart failure and stage 1 through stage 4 chronic kidney disease, or unspecified chronic kidney disease: Secondary | ICD-10-CM | POA: Diagnosis not present

## 2018-02-28 DIAGNOSIS — N183 Chronic kidney disease, stage 3 (moderate): Secondary | ICD-10-CM | POA: Diagnosis not present

## 2018-02-28 DIAGNOSIS — G309 Alzheimer's disease, unspecified: Secondary | ICD-10-CM | POA: Diagnosis not present

## 2018-02-28 DIAGNOSIS — I2510 Atherosclerotic heart disease of native coronary artery without angina pectoris: Secondary | ICD-10-CM | POA: Diagnosis not present

## 2018-02-28 DIAGNOSIS — I480 Paroxysmal atrial fibrillation: Secondary | ICD-10-CM | POA: Diagnosis not present

## 2018-03-08 DIAGNOSIS — I130 Hypertensive heart and chronic kidney disease with heart failure and stage 1 through stage 4 chronic kidney disease, or unspecified chronic kidney disease: Secondary | ICD-10-CM | POA: Diagnosis not present

## 2018-03-08 DIAGNOSIS — I2510 Atherosclerotic heart disease of native coronary artery without angina pectoris: Secondary | ICD-10-CM | POA: Diagnosis not present

## 2018-03-08 DIAGNOSIS — N183 Chronic kidney disease, stage 3 (moderate): Secondary | ICD-10-CM | POA: Diagnosis not present

## 2018-03-08 DIAGNOSIS — I480 Paroxysmal atrial fibrillation: Secondary | ICD-10-CM | POA: Diagnosis not present

## 2018-03-08 DIAGNOSIS — G309 Alzheimer's disease, unspecified: Secondary | ICD-10-CM | POA: Diagnosis not present

## 2018-03-08 DIAGNOSIS — I5043 Acute on chronic combined systolic (congestive) and diastolic (congestive) heart failure: Secondary | ICD-10-CM | POA: Diagnosis not present

## 2018-03-15 ENCOUNTER — Other Ambulatory Visit: Payer: Self-pay

## 2018-03-15 ENCOUNTER — Emergency Department (HOSPITAL_COMMUNITY): Payer: Medicare Other

## 2018-03-15 ENCOUNTER — Inpatient Hospital Stay (HOSPITAL_COMMUNITY)
Admission: EM | Admit: 2018-03-15 | Discharge: 2018-03-21 | DRG: 193 | Disposition: A | Discharge status: Home Health | Payer: Medicare Other | Attending: Internal Medicine | Admitting: Internal Medicine

## 2018-03-15 ENCOUNTER — Encounter (HOSPITAL_COMMUNITY): Payer: Self-pay | Admitting: Emergency Medicine

## 2018-03-15 DIAGNOSIS — Z9981 Dependence on supplemental oxygen: Secondary | ICD-10-CM

## 2018-03-15 DIAGNOSIS — I493 Ventricular premature depolarization: Secondary | ICD-10-CM | POA: Diagnosis not present

## 2018-03-15 DIAGNOSIS — R008 Other abnormalities of heart beat: Secondary | ICD-10-CM | POA: Diagnosis not present

## 2018-03-15 DIAGNOSIS — Z8673 Personal history of transient ischemic attack (TIA), and cerebral infarction without residual deficits: Secondary | ICD-10-CM

## 2018-03-15 DIAGNOSIS — I130 Hypertensive heart and chronic kidney disease with heart failure and stage 1 through stage 4 chronic kidney disease, or unspecified chronic kidney disease: Secondary | ICD-10-CM | POA: Diagnosis present

## 2018-03-15 DIAGNOSIS — R0602 Shortness of breath: Secondary | ICD-10-CM | POA: Diagnosis not present

## 2018-03-15 DIAGNOSIS — N183 Chronic kidney disease, stage 3 unspecified: Secondary | ICD-10-CM | POA: Diagnosis present

## 2018-03-15 DIAGNOSIS — J84112 Idiopathic pulmonary fibrosis: Secondary | ICD-10-CM | POA: Diagnosis not present

## 2018-03-15 DIAGNOSIS — I9589 Other hypotension: Secondary | ICD-10-CM | POA: Diagnosis not present

## 2018-03-15 DIAGNOSIS — E785 Hyperlipidemia, unspecified: Secondary | ICD-10-CM | POA: Diagnosis not present

## 2018-03-15 DIAGNOSIS — J849 Interstitial pulmonary disease, unspecified: Secondary | ICD-10-CM

## 2018-03-15 DIAGNOSIS — F0280 Dementia in other diseases classified elsewhere without behavioral disturbance: Secondary | ICD-10-CM | POA: Diagnosis present

## 2018-03-15 DIAGNOSIS — I4821 Permanent atrial fibrillation: Secondary | ICD-10-CM | POA: Diagnosis present

## 2018-03-15 DIAGNOSIS — Z7901 Long term (current) use of anticoagulants: Secondary | ICD-10-CM

## 2018-03-15 DIAGNOSIS — Z8249 Family history of ischemic heart disease and other diseases of the circulatory system: Secondary | ICD-10-CM

## 2018-03-15 DIAGNOSIS — I509 Heart failure, unspecified: Secondary | ICD-10-CM | POA: Diagnosis not present

## 2018-03-15 DIAGNOSIS — Z299 Encounter for prophylactic measures, unspecified: Secondary | ICD-10-CM | POA: Diagnosis not present

## 2018-03-15 DIAGNOSIS — G309 Alzheimer's disease, unspecified: Secondary | ICD-10-CM | POA: Diagnosis present

## 2018-03-15 DIAGNOSIS — Z820 Family history of epilepsy and other diseases of the nervous system: Secondary | ICD-10-CM | POA: Diagnosis not present

## 2018-03-15 DIAGNOSIS — I2510 Atherosclerotic heart disease of native coronary artery without angina pectoris: Secondary | ICD-10-CM | POA: Diagnosis present

## 2018-03-15 DIAGNOSIS — E039 Hypothyroidism, unspecified: Secondary | ICD-10-CM | POA: Diagnosis present

## 2018-03-15 DIAGNOSIS — I5023 Acute on chronic systolic (congestive) heart failure: Secondary | ICD-10-CM

## 2018-03-15 DIAGNOSIS — I428 Other cardiomyopathies: Secondary | ICD-10-CM | POA: Diagnosis present

## 2018-03-15 DIAGNOSIS — Z79890 Hormone replacement therapy (postmenopausal): Secondary | ICD-10-CM

## 2018-03-15 DIAGNOSIS — I10 Essential (primary) hypertension: Secondary | ICD-10-CM | POA: Diagnosis not present

## 2018-03-15 DIAGNOSIS — Y95 Nosocomial condition: Secondary | ICD-10-CM | POA: Diagnosis present

## 2018-03-15 DIAGNOSIS — I25118 Atherosclerotic heart disease of native coronary artery with other forms of angina pectoris: Secondary | ICD-10-CM | POA: Diagnosis not present

## 2018-03-15 DIAGNOSIS — Z79899 Other long term (current) drug therapy: Secondary | ICD-10-CM

## 2018-03-15 DIAGNOSIS — J189 Pneumonia, unspecified organism: Secondary | ICD-10-CM | POA: Diagnosis not present

## 2018-03-15 DIAGNOSIS — I5043 Acute on chronic combined systolic (congestive) and diastolic (congestive) heart failure: Secondary | ICD-10-CM | POA: Diagnosis present

## 2018-03-15 DIAGNOSIS — J9621 Acute and chronic respiratory failure with hypoxia: Secondary | ICD-10-CM | POA: Diagnosis present

## 2018-03-15 DIAGNOSIS — R0902 Hypoxemia: Secondary | ICD-10-CM

## 2018-03-15 DIAGNOSIS — I4891 Unspecified atrial fibrillation: Secondary | ICD-10-CM | POA: Diagnosis not present

## 2018-03-15 DIAGNOSIS — I959 Hypotension, unspecified: Secondary | ICD-10-CM | POA: Diagnosis not present

## 2018-03-15 DIAGNOSIS — R05 Cough: Secondary | ICD-10-CM | POA: Diagnosis not present

## 2018-03-15 DIAGNOSIS — Z6827 Body mass index (BMI) 27.0-27.9, adult: Secondary | ICD-10-CM | POA: Diagnosis not present

## 2018-03-15 DIAGNOSIS — I110 Hypertensive heart disease with heart failure: Secondary | ICD-10-CM | POA: Diagnosis not present

## 2018-03-15 DIAGNOSIS — R079 Chest pain, unspecified: Secondary | ICD-10-CM | POA: Diagnosis not present

## 2018-03-15 HISTORY — DX: Dependence on other enabling machines and devices: Z99.89

## 2018-03-15 HISTORY — DX: Interstitial pulmonary disease, unspecified: J84.9

## 2018-03-15 HISTORY — DX: Dependence on supplemental oxygen: Z99.81

## 2018-03-15 HISTORY — DX: Other cardiomyopathies: I42.8

## 2018-03-15 LAB — BASIC METABOLIC PANEL
Anion gap: 8 (ref 5–15)
BUN: 17 mg/dL (ref 8–23)
CO2: 23 mmol/L (ref 22–32)
CREATININE: 1.22 mg/dL (ref 0.61–1.24)
Calcium: 8.8 mg/dL — ABNORMAL LOW (ref 8.9–10.3)
Chloride: 104 mmol/L (ref 98–111)
GFR calc Af Amer: >60 mL/min (ref >60)
GFR calc non Af Amer: 53 mL/min — ABNORMAL LOW (ref >60)
Glucose, Bld: 106 mg/dL — ABNORMAL HIGH (ref 70–99)
Potassium: 3.8 mmol/L (ref 3.5–5.1)
SODIUM: 135 mmol/L (ref 135–145)

## 2018-03-15 LAB — CBC WITH DIFFERENTIAL/PLATELET
ABS IMMATURE GRANULOCYTES: 0.05 10*3/uL (ref 0.00–0.07)
Basophils Absolute: 0.1 10*3/uL (ref 0.0–0.1)
Basophils Relative: 1 %
Eosinophils Absolute: 0.6 10*3/uL — ABNORMAL HIGH (ref 0.0–0.5)
Eosinophils Relative: 4 %
HEMATOCRIT: 42.5 % (ref 39.0–52.0)
Hemoglobin: 13.3 g/dL (ref 13.0–17.0)
IMMATURE GRANULOCYTES: 0 %
LYMPHS PCT: 18 %
Lymphs Abs: 2.6 10*3/uL (ref 0.7–4.0)
MCH: 30.8 pg (ref 26.0–34.0)
MCHC: 31.3 g/dL (ref 30.0–36.0)
MCV: 98.4 fL (ref 80.0–100.0)
MONO ABS: 1.3 10*3/uL — AB (ref 0.1–1.0)
MONOS PCT: 9 %
NEUTROS ABS: 10 10*3/uL — AB (ref 1.7–7.7)
NEUTROS PCT: 68 %
PLATELETS: 299 10*3/uL (ref 150–400)
RBC: 4.32 MIL/uL (ref 4.22–5.81)
RDW: 14.3 % (ref 11.5–15.5)
WBC: 14.7 10*3/uL — ABNORMAL HIGH (ref 4.0–10.5)
nRBC: 0 % (ref 0.0–0.2)

## 2018-03-15 LAB — GLUCOSE, CAPILLARY: Glucose-Capillary: 116 mg/dL — ABNORMAL HIGH (ref 70–99)

## 2018-03-15 LAB — BRAIN NATRIURETIC PEPTIDE: B NATRIURETIC PEPTIDE 5: 1477 pg/mL — AB (ref 0.0–100.0)

## 2018-03-15 LAB — INFLUENZA PANEL BY PCR (TYPE A & B)
Influenza A By PCR: NEGATIVE
Influenza B By PCR: NEGATIVE

## 2018-03-15 LAB — TROPONIN I

## 2018-03-15 LAB — TSH: TSH: 2.052 u[IU]/mL (ref 0.350–4.500)

## 2018-03-15 MED ORDER — VANCOMYCIN HCL 10 G IV SOLR
1500.0000 mg | Freq: Once | INTRAVENOUS | Status: AC
  Administered 2018-03-15: 1500 mg via INTRAVENOUS
  Filled 2018-03-15: qty 1500

## 2018-03-15 MED ORDER — VANCOMYCIN HCL 10 G IV SOLR: 1250.0000 mg | INTRAVENOUS | Status: DC

## 2018-03-15 MED ORDER — ADULT MULTIVITAMIN W/MINERALS CH
ORAL_TABLET | Freq: Every day | ORAL | Status: DC
  Administered 2018-03-16 – 2018-03-21 (×6): 1 via ORAL
  Filled 2018-03-15 (×9): qty 1

## 2018-03-15 MED ORDER — SODIUM CHLORIDE 0.9 % IV SOLN
1.0000 g | Freq: Three times a day (TID) | INTRAVENOUS | Status: DC
  Administered 2018-03-15: 1 g via INTRAVENOUS
  Filled 2018-03-15 (×4): qty 1

## 2018-03-15 MED ORDER — ACETAMINOPHEN 650 MG RE SUPP: 650.0000 mg | Freq: Four times a day (QID) | RECTAL | Status: DC | PRN

## 2018-03-15 MED ORDER — FUROSEMIDE 10 MG/ML IJ SOLN
40.0000 mg | Freq: Once | INTRAMUSCULAR | Status: AC
  Administered 2018-03-15: 40 mg via INTRAVENOUS
  Filled 2018-03-15: qty 4

## 2018-03-15 MED ORDER — METOPROLOL TARTRATE 25 MG PO TABS
25.0000 mg | ORAL_TABLET | Freq: Two times a day (BID) | ORAL | Status: DC
  Administered 2018-03-16 (×2): 25 mg via ORAL
  Filled 2018-03-15 (×4): qty 1

## 2018-03-15 MED ORDER — SODIUM CHLORIDE 0.9 % IV BOLUS
500.0000 mL | Freq: Once | INTRAVENOUS | Status: AC
  Administered 2018-03-15: 500 mL via INTRAVENOUS

## 2018-03-15 MED ORDER — FUROSEMIDE 10 MG/ML IJ SOLN
40.0000 mg | Freq: Every day | INTRAMUSCULAR | Status: DC
  Administered 2018-03-15 – 2018-03-16 (×2): 40 mg via INTRAVENOUS
  Filled 2018-03-15 (×2): qty 4

## 2018-03-15 MED ORDER — ONDANSETRON HCL 4 MG/2ML IJ SOLN: 4.0000 mg | Freq: Four times a day (QID) | INTRAMUSCULAR | Status: DC | PRN

## 2018-03-15 MED ORDER — RIVAROXABAN 15 MG PO TABS
15.0000 mg | ORAL_TABLET | Freq: Every day | ORAL | Status: DC
  Administered 2018-03-16 – 2018-03-20 (×4): 15 mg via ORAL
  Filled 2018-03-15 (×5): qty 1

## 2018-03-15 MED ORDER — ORAL CARE MOUTH RINSE
15.0000 mL | Freq: Two times a day (BID) | OROMUCOSAL | Status: DC
  Administered 2018-03-16 – 2018-03-21 (×6): 15 mL via OROMUCOSAL

## 2018-03-15 MED ORDER — ASPIRIN EC 81 MG PO TBEC
81.0000 mg | DELAYED_RELEASE_TABLET | Freq: Every day | ORAL | Status: DC
  Administered 2018-03-16 – 2018-03-20 (×5): 81 mg via ORAL
  Filled 2018-03-15 (×6): qty 1

## 2018-03-15 MED ORDER — SODIUM CHLORIDE 0.9 % IV SOLN
1.0000 g | Freq: Three times a day (TID) | INTRAVENOUS | Status: DC
  Administered 2018-03-15 – 2018-03-16 (×2): 1 g via INTRAVENOUS
  Filled 2018-03-15 (×8): qty 1

## 2018-03-15 MED ORDER — SODIUM CHLORIDE 0.9% FLUSH
3.0000 mL | Freq: Two times a day (BID) | INTRAVENOUS | Status: DC
  Administered 2018-03-15 – 2018-03-20 (×6): 3 mL via INTRAVENOUS

## 2018-03-15 MED ORDER — ACETAMINOPHEN 325 MG PO TABS: 650.0000 mg | ORAL_TABLET | Freq: Four times a day (QID) | ORAL | Status: DC | PRN

## 2018-03-15 MED ORDER — ONDANSETRON HCL 4 MG PO TABS: 4.0000 mg | ORAL_TABLET | Freq: Four times a day (QID) | ORAL | Status: DC | PRN

## 2018-03-15 MED ORDER — LEVOTHYROXINE SODIUM 25 MCG PO TABS
25.0000 ug | ORAL_TABLET | Freq: Every day | ORAL | Status: DC
  Administered 2018-03-16 – 2018-03-21 (×6): 25 ug via ORAL
  Filled 2018-03-15 (×6): qty 1

## 2018-03-15 MED ORDER — SODIUM CHLORIDE 0.9% FLUSH: 3.0000 mL | INTRAVENOUS | Status: DC | PRN

## 2018-03-15 MED ORDER — IPRATROPIUM-ALBUTEROL 0.5-2.5 (3) MG/3ML IN SOLN
3.0000 mL | Freq: Once | RESPIRATORY_TRACT | Status: AC
  Administered 2018-03-15: 3 mL via RESPIRATORY_TRACT
  Filled 2018-03-15: qty 3

## 2018-03-15 MED ORDER — SODIUM CHLORIDE 0.9 % IV SOLN
250.0000 mL | INTRAVENOUS | Status: DC | PRN
  Administered 2018-03-18 – 2018-03-20 (×3): 250 mL via INTRAVENOUS

## 2018-03-15 MED ORDER — SENNOSIDES-DOCUSATE SODIUM 8.6-50 MG PO TABS: 1.0000 | ORAL_TABLET | Freq: Every evening | ORAL | Status: DC | PRN

## 2018-03-15 MED ORDER — ALBUTEROL SULFATE (2.5 MG/3ML) 0.083% IN NEBU
2.5000 mg | INHALATION_SOLUTION | Freq: Once | RESPIRATORY_TRACT | Status: AC
  Administered 2018-03-15: 2.5 mg via RESPIRATORY_TRACT
  Filled 2018-03-15: qty 3

## 2018-03-15 NOTE — Progress Notes (Signed)
Pharmacy Antibiotic Note  Alejandro [name] is a 84 y.o. male admitted on 03/15/2018 with pneumonia.  Pharmacy has been consulted for Vancomycin dosing.  Plan: Vancomycin 1250 mg IV every 24 hours.  Goal trough 15-20 mcg/mL.  Cefepime 1000 mg IV every 8 hours. Monitor labs, c/s, and vanco trough as indicated.  Height: 5\' 8"  (172.7 cm) Weight: 184 lb (83.5 kg) IBW/kg (Calculated) : 68.4  Temp (24hrs), Avg:98 F (36.7 C), Min:98 F (36.7 C), Max:98 F (36.7 C)  Recent Labs  Lab 03/15/18 1328  WBC 14.7*  CREATININE 1.22    Estimated Creatinine Clearance: 48.3 mL/min (by C-G formula based on SCr of 1.22 mg/dL).    No Known Allergies  Antimicrobials this admission: Vanco 10/16 >>  Cefepime 10/16 >>   Microbiology results: 10/16 BCx: pending   Thank you for allowing pharmacy to be a part of this patient's care.   Margot Ables, PharmD Clinical Pharmacist 03/15/2018 4:01 PM

## 2018-03-15 NOTE — ED Provider Notes (Signed)
[facility] EMERGENCY DEPARTMENT Provider Note   CSN: [id number] Arrival date & time: 03/15/18  1310     History   Chief Complaint Chief Complaint  Patient presents with  . Cough  . Shortness of Breath    HPI Alejandro [name] is a 84 y.o. male.  The history is provided by the patient and the spouse. The history is limited by the condition of the patient (Hx dementia).  Cough  Associated symptoms include shortness of breath.  Shortness of Breath  Associated symptoms include cough.    Pt was seen at 1320.  Per pt and his wife: Pt c/o increasing SOB for the past 3 to 4 days. Has been associated with productive cough. Pt was evaluated by his PMD PTA, then sent to the ED "to see if maybe he had pneumonia." Pt's wife states pt has hx of "pulmonary fibrosis." Denies fevers, no CP/palpitations, no abd pain, no N/V/D, no back pain, no focal motor weakness.   Past Medical History:  Diagnosis Date  . Alzheimer's dementia ([place])   . CAD (coronary artery disease)    a. 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 Med rx recommended, EF 30-35% at that cath.  . CHF (congestive heart failure) ([place])   . Chronic shortness of breath   . CKD (chronic kidney disease), stage III ([place])   . Essential hypertension   . ILD (interstitial lung disease) ([place]) 01/2018   via CT scan   . Memory loss   . Non-ischemic cardiomyopathy ([place])    EF 35%  . NSVT (nonsustained ventricular tachycardia) ([name])    Negative EP study  . On home O2    prn during day, continuous qhs  . Orthostatic hypotension   . Paroxysmal atrial fibrillation ([name])   . Secondary cardiomyopathy ([place])    LVEF 40-45% January 2014  . Syncope   . TIA (transient ischemic attack)   . Uses walker     Patient Active Problem List   Diagnosis Date Noted  . Acute on chronic systolic HF (heart failure) ([place]) 02/01/2018  . Hypothyroidism 01/31/2018  . CHF (congestive heart failure) ([place]) 01/31/2018  . Acute on chronic combined systolic and diastolic CHF (congestive heart failure) ([place]) 10/14/2016  . AF (paroxysmal atrial fibrillation) ([place]) 10/14/2016  . Acute respiratory failure with hypoxia ([place]) 10/14/2016  . CKD (chronic kidney disease) stage 3, GFR 30-59 ml/min ([name]) 10/14/2016  . Heme positive stool 08/06/2016  . Orthostatic hypotension 12/25/2014  . Right sided weakness 12/25/2014  . HLD (hyperlipidemia) 12/25/2014  . Hypotension 12/25/2014  . AKI (acute kidney injury) ([place]) 12/25/2014  . History of colonic polyps   . Rectal bleeding 09/03/2014  . Chronic anticoagulation 09/03/2014  . Chest tightness 08/13/2014  . Chest pain 08/13/2014  . Dizziness 08/13/2014  . Episodic lightheadedness   . NSVT (nonsustained ventricular tachycardia) ([place]) 08/05/2014  . Congestive dilated cardiomyopathy ([place]) 08/05/2014  . Syncope 08/05/2014  . Atrial fibrillation with RVR ([place]) 07/30/2014  . Secondary cardiomyopathy ([place]) 06/04/2014  . Essential hypertension 05/02/2013    Past Surgical History:  Procedure Laterality Date  . CARDIOVERSION N/A 10/19/2016   Procedure: CARDIOVERSION;  Surgeon: [name], MD;  [facility];  Service: Cardiovascular;  Laterality: N/A;  . CATARACT EXTRACTION    . COLONOSCOPY  unknown  . COLONOSCOPY N/A 09/05/2014   Procedure:  COLONOSCOPY;  Surgeon: [name], MD;  [facility];  Service: Endoscopy;  Laterality: N/A;  145pm  . ELECTROPHYSIOLOGY STUDY N/A 08/05/2014   Procedure: ELECTROPHYSIOLOGY STUDY;  Surgeon: Deboraha Sprang, MD;  Location: [facility] CATH LAB;  Service: Cardiovascular;  Laterality: N/A;  . LAPAROSCOPIC APPENDECTOMY  November 2015   Dr. [name]  . LEFT HEART CATHETERIZATION WITH CORONARY ANGIOGRAM N/A 07/31/2014   Procedure: LEFT HEART  CATHETERIZATION WITH CORONARY ANGIOGRAM;  Surgeon: [name], MD;  Location: [facility] CATH LAB;  Service: Cardiovascular;  Laterality: N/A;  . LOOP RECORDER IMPLANT    . LOOP RECORDER REMOVAL  11/09/2017   MDT LINQ loop recorder removed for end of battery life of the device by Dr Rayann Heman        Home Medications    Prior to Admission medications   Medication Sig Start Date End Date Taking? Authorizing Provider  acetaminophen (TYLENOL) 500 MG tablet Take 1,000-1,500 mg by mouth every 6 (six) hours as needed for mild pain or moderate pain.    [provider]  furosemide (LASIX) 20 MG tablet Take 1 tablet (20 mg total) by mouth as needed (WEIGHT GAIN OF 3 LBS OR MORE IN 24 HOURS). 02/05/18 05/06/18  Barton Dubois, MD  levothyroxine (SYNTHROID, LEVOTHROID) 25 MCG tablet Take 25 mcg by mouth daily before breakfast.    [provider]  metoprolol tartrate (LOPRESSOR) 25 MG tablet Take 1 tablet (25 mg total) by mouth 2 (two) times daily. 02/05/18   Barton Dubois, MD  midodrine (PROAMATINE) 5 MG tablet Take 5 mg by mouth 2 (two) times daily with a meal.     [provider]  Multiple Vitamins-Minerals (MULTIVITAMIN MEN 50+ PO) Take 1 tablet by mouth daily.    [provider]  XARELTO 15 MG TABS tablet TAKE 1 TABLET BY MOUTH DAILY WITH SUPPER. 03/28/17   Satira Sark, MD    Family History Family History  Problem Relation Age of Onset  . Heart disease Father        Diagnosed in his 52s  . Arthritis Father   . Alzheimer's disease Mother     Social History Social History   Tobacco Use  . Smoking status: Never Smoker  . Smokeless tobacco: Never Used  Substance Use Topics  . Alcohol use: No    Alcohol/week: 0.0 standard drinks  . Drug use: No     Allergies   Patient has no known allergies.   Review of Systems Review of Systems  Unable to perform ROS: Dementia  Respiratory: Positive for cough and shortness of breath.      Physical  Exam Updated Vital Signs BP 96/77 (BP Location: Left Arm)   Pulse (!) 50   Temp 98 F (36.7 C) (Oral)   Resp (!) 22   Ht 5\' 8"  (1.727 m)   Wt 83.5 kg   SpO2 95%   BMI 27.98 kg/m    Patient Vitals for the past 24 hrs:  BP Temp Temp src Pulse Resp SpO2 Height Weight  03/15/18 1345 - - - - - 95 % - -  03/15/18 1320 96/77 98 F (36.7 C) Oral (!) 50 (!) 22 (!) 86 % - -  03/15/18 1319 - - - - - (!) 88 % 5\' 8"  (1.727 m) 83.5 kg     Physical Exam 1325: Physical examination:  Nursing notes reviewed; Vital signs and O2 SAT reviewed;  Constitutional: Well developed, Well nourished, In no acute distress; Head:  Normocephalic, atraumatic; Eyes: EOMI, PERRL, No scleral icterus; ENMT: Mouth and pharynx normal, Mucous membranes dry; Neck: Supple, Full range of motion, No lymphadenopathy; Cardiovascular: Irregular rate and rhythm, No gallop; Respiratory: Breath sounds coarse & equal bilaterally, No wheezes.  Speaking full sentences with ease, Normal respiratory effort/excursion; Chest: Nontender, Movement normal; Abdomen: Soft, Nontender, Nondistended, Normal bowel sounds; Genitourinary: No CVA tenderness; Extremities: Peripheral pulses normal, No tenderness, No edema, No calf edema or asymmetry.; Neuro: Awake, alert, confused per hx dementia. No facial droop.  Speech clear. No gross focal motor deficits in extremities.; Skin: Color normal, Warm, Dry.   ED Treatments / Results  Labs (all labs ordered are listed, but only abnormal results are displayed)   EKG EKG Interpretation  Date/Time:  Wednesday March 15 2018 13:23:48 EDT Ventricular Rate:  104 PR Interval:    QRS Duration: 113 QT Interval:  364 QTC Calculation: 479 R Axis:   -64 Text Interpretation:  Atrial fibrillation Ventricular premature complex Left axis deviation Left anterior fascicular block Borderline low voltage, extremity leads Abnormal R-wave progression, late transition Borderline prolonged QT interval When compared with  ECG of 01/31/2018 No significant change was found Confirmed by Francine Graven [id number]) on 03/15/2018 1:35:28 PM   Radiology   Procedures Procedures (including critical care time)  Medications Ordered in ED Medications  ipratropium-albuterol (DUONEB) 0.5-2.5 (3) MG/3ML nebulizer solution 3 mL (3 mLs Nebulization Given 03/15/18 1344)  albuterol (PROVENTIL) (2.5 MG/3ML) 0.083% nebulizer solution 2.5 mg (2.5 mg Nebulization Given 03/15/18 1344)     Initial Impression / Assessment and Plan / ED Course  I have reviewed the triage vital signs and the nursing notes.  Pertinent labs & imaging results that were available during my care of the patient were reviewed by me and considered in my medical decision making (see chart for details).  MDM Reviewed: previous chart, nursing note and vitals Reviewed previous: labs and ECG Interpretation: labs, ECG and x-ray Total time providing critical care: 30-74 minutes. This excludes time spent performing separately reportable procedures and services. Consults: admitting MD   CRITICAL CARE Performed by: Francine Graven Total critical care time: 35 minutes Critical care time was exclusive of separately billable procedures and treating other patients. Critical care was necessary to treat or prevent imminent or life-threatening deterioration. Critical care was time spent personally by me on the following activities: development of treatment plan with patient and/or surrogate as well as nursing, discussions with consultants, evaluation of patient's response to treatment, examination of patient, obtaining history from patient or surrogate, ordering and performing treatments and interventions, ordering and review of laboratory studies, ordering and review of radiographic studies, pulse oximetry and re-evaluation of patient's condition.   Results for orders placed or performed during the hospital encounter of 60/45/40  Basic metabolic panel  Result Value  Ref Range   Sodium 135 135 - 145 mmol/L   Potassium 3.8 3.5 - 5.1 mmol/L   Chloride 104 98 - 111 mmol/L   CO2 23 22 - 32 mmol/L   Glucose, Bld 106 (H) 70 - 99 mg/dL   BUN 17 8 - 23 mg/dL   Creatinine, Ser 1.22 0.61 - 1.24 mg/dL   Calcium 8.8 (L) 8.9 - 10.3 mg/dL   GFR calc non Af Amer 53 (L) >60 mL/min   GFR calc Af Amer >60 >60 mL/min   Anion gap 8 5 - 15  Brain natriuretic peptide  Result Value Ref Range   B Natriuretic Peptide 1,477.0 (H) 0.0 - 100.0 pg/mL  Troponin  I  Result Value Ref Range   Troponin I <0.03 <0.03 ng/mL  CBC with Differential  Result Value Ref Range   WBC 14.7 (H) 4.0 - 10.5 K/uL   RBC 4.32 4.22 - 5.81 MIL/uL   Hemoglobin 13.3 13.0 - 17.0 g/dL   HCT 42.5 39.0 - 52.0 %   MCV 98.4 80.0 - 100.0 fL   MCH 30.8 26.0 - 34.0 pg   MCHC 31.3 30.0 - 36.0 g/dL   RDW 14.3 11.5 - 15.5 %   Platelets 299 150 - 400 K/uL   nRBC 0.0 0.0 - 0.2 %   Neutrophils Relative % 68 %   Neutro Abs 10.0 (H) 1.7 - 7.7 K/uL   Lymphocytes Relative 18 %   Lymphs Abs 2.6 0.7 - 4.0 K/uL   Monocytes Relative 9 %   Monocytes Absolute 1.3 (H) 0.1 - 1.0 K/uL   Eosinophils Relative 4 %   Eosinophils Absolute 0.6 (H) 0.0 - 0.5 K/uL   Basophils Relative 1 %   Basophils Absolute 0.1 0.0 - 0.1 K/uL   Immature Granulocytes 0 %   Abs Immature Granulocytes 0.05 0.00 - 0.07 K/uL   Reactive, Benign Lymphocytes PRESENT    Dg Chest Port 1 View Result Date: 03/15/2018 CLINICAL DATA:  Productive cough and dyspnea EXAM: PORTABLE CHEST 1 VIEW COMPARISON:  01/31/2018 CXR and chest CT 02/03/2018 FINDINGS: Stable cardiomegaly with minimal aortic atherosclerosis. Superimposed on chronic interstitial prominence are subtle areas of confluent opacity at the left mid and lower lung. Findings are suspicious for atelectasis and/or minimal bronchopneumonia. IMPRESSION: Superimposed on chronic interstitial lung disease or or subtle airspace opacities in the left mid and lower lung. Atelectasis and/or bronchopneumonia  might account for this appearance. Stable cardiomegaly with aortic atherosclerosis. Electronically Signed   By: Ashley Royalty M.D.   On: 03/15/2018 14:30    Results for BRAYLIN, XU (MRN [id number]) as of 03/15/2018 14:50  Ref. Range 08/02/2014 02:55 10/14/2016 12:24 01/31/2018 10:27 03/15/2018 13:28  B Natriuretic Peptide Latest Ref Range: 0.0 - 100.0 pg/mL 285.1 (H) 794.0 (H) 1,122.0 (H) 1,477.0 (H)    1455:  Short neb given for hypoxia on arrival with O2 Sats increasing from 86% R/A to 95% while on O2 3L N/C. Pt ambulated around ED with O2 Sats dropping to 77% R/A, HR increasing to 150's and pt c/o increasing SOB.  IV abx started for HCAP. IV lasix given for acute on chronic CHF.  Dx and testing d/w pt and family.  Questions answered.  Verb understanding, agreeable to admit. T/C returned from [facility] Dr. Jerilee Hoh, case discussed, including:  HPI, pertinent PM/SHx, VS/PE, dx testing, ED course and treatment:  Agreeable to admit.        Final Clinical Impressions(s) / ED Diagnoses   Final diagnoses:  None    ED Discharge Orders    None       Francine Graven, DO 03/16/18 1428

## 2018-03-15 NOTE — ED Notes (Signed)
Pt ambulated around ED per Dr. Thurnell Garbe.   Pt initial o2 was 94%. Pt sats dropped to 77% at lowest.   Pt initial Hr was 108. Pt HR jumped up and down during ambulation. Highest Hr was 154.   Pt visibly SOB. Pt states he "has to stop every so often" to catch his breath. Pt states he does this at home as well. Pt gait unsteady. Used w/c as walker.

## 2018-03-15 NOTE — Progress Notes (Signed)
Pt requested all side rails to be up because he doesn't want to fall. Lifted rails upon patient request.

## 2018-03-15 NOTE — Progress Notes (Signed)
Pt reports already taking dose of Xarelto on 10/16. Spoke with pharmacist about rescheduling for 10/17 at supper time.

## 2018-03-15 NOTE — H&P (Signed)
History and Physical    Alejandro [name]:[id number] DOB: 10/02/1932 DOA: 03/15/2018  Referring MD/NP/PA: Francine Graven, EDP PCP: Glenda Chroman, MD  Patient coming from: PCPs office  Chief Complaint: Shortness of breath  HPI: Alejandro [name] is a 84 y.o. male with history of nonischemic cardiomyopathy with a known ejection fraction of 35%, dementia, interstitial lung disease, history of coronary artery disease with most recent cath in March 2016 at which time medical management was recommended, paroxysmal atrial fibrillation maintained on chronic anticoagulation with Xarelto who presents to the hospital today with shortness of breath.  Unfortunately when I examined him there are no family members at bedside and he is a very poor historian on account of his Alzheimer's dementia.  All he can tell me is that he has been very short of breath and went to see his doctor today who sent him to the emergency department.  It appears based on documentation by EDP that patient was found to be hypoxemic at PCPs office prompting referral to the emergency department.  He does have a history of interstitial lung disease and on chest x-ray today appears to have superimposed pneumonia.,  Also possibility of acute heart failure given BNP of about 1400.  Admission has been requested for further evaluation and management.  Past Medical/Surgical History: Past Medical History:  Diagnosis Date  . Alzheimer's dementia ([place])   . CAD (coronary artery disease)    a. 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 Med rx recommended, EF 30-35% at that cath.  . CHF (congestive heart failure) ([place])   . Chronic shortness of breath   . CKD (chronic kidney disease), stage III ([place])   . Essential hypertension   . ILD  (interstitial lung disease) ([place]) 01/2018   via CT scan   . Memory loss   . Non-ischemic cardiomyopathy ([place])    EF 35%  . NSVT (nonsustained ventricular tachycardia) ([name])    Negative EP study  . On home O2    prn during day, continuous qhs  . Orthostatic hypotension   . Paroxysmal atrial fibrillation ([name])   . Secondary cardiomyopathy ([place])    LVEF 40-45% January 2014  . Syncope   . TIA (transient ischemic attack)   . Uses walker     Past Surgical History:  Procedure Laterality Date  . CARDIOVERSION N/A 10/19/2016   Procedure: CARDIOVERSION;  Surgeon: [name], MD;  [facility];  Service: Cardiovascular;  Laterality: N/A;  . CATARACT EXTRACTION    . COLONOSCOPY  unknown  . COLONOSCOPY N/A 09/05/2014   Procedure: COLONOSCOPY;  Surgeon: [name], MD;  Location: [facility];  Service: Endoscopy;  Laterality: N/A;  145pm  . ELECTROPHYSIOLOGY STUDY N/A 08/05/2014   Procedure: ELECTROPHYSIOLOGY STUDY;  Surgeon: Deboraha Sprang, MD;  Location: [facility] CATH LAB;  Service: Cardiovascular;  Laterality: N/A;  . LAPAROSCOPIC APPENDECTOMY  November 2015   Dr. [name]  . LEFT HEART CATHETERIZATION WITH CORONARY ANGIOGRAM N/A 07/31/2014   Procedure: LEFT HEART CATHETERIZATION WITH CORONARY ANGIOGRAM;  Surgeon: [name], MD;  Location: [facility] CATH LAB;  Service: Cardiovascular;  Laterality: N/A;  . LOOP RECORDER IMPLANT    . LOOP RECORDER REMOVAL  11/09/2017   MDT LINQ loop recorder removed for end of battery life of the device by Dr Rayann Heman    Social  History:  reports that he has never smoked. He has never used smokeless tobacco. He reports that he does not drink alcohol or use drugs.  Allergies: No Known Allergies  Family History:  Family History  Problem Relation Age of Onset  . Heart disease Father        Diagnosed in his 37s  . Arthritis Father   . Alzheimer's disease Mother     Prior to Admission medications   Medication Sig Start Date End Date  Taking? Authorizing Provider  acetaminophen (TYLENOL) 500 MG tablet Take 1,000-1,500 mg by mouth every 6 (six) hours as needed for mild pain or moderate pain.    [provider]  furosemide (LASIX) 20 MG tablet Take 1 tablet (20 mg total) by mouth as needed (WEIGHT GAIN OF 3 LBS OR MORE IN 24 HOURS). 02/05/18 05/06/18  Barton Dubois, MD  levothyroxine (SYNTHROID, LEVOTHROID) 25 MCG tablet Take 25 mcg by mouth daily before breakfast.    [provider]  metoprolol tartrate (LOPRESSOR) 25 MG tablet Take 1 tablet (25 mg total) by mouth 2 (two) times daily. 02/05/18   Barton Dubois, MD  midodrine (PROAMATINE) 5 MG tablet Take 5 mg by mouth 2 (two) times daily with a meal.     [provider]  Multiple Vitamins-Minerals (MULTIVITAMIN MEN 50+ PO) Take 1 tablet by mouth daily.    [provider]  XARELTO 15 MG TABS tablet TAKE 1 TABLET BY MOUTH DAILY WITH SUPPER. 03/28/17   Satira Sark, MD    Review of Systems:  Unable to obtain given his history of dementia  Physical Exam: Vitals:   03/15/18 1345 03/15/18 1400 03/15/18 1728 03/15/18 1730  BP:  100/75  (!) 126/91  Pulse:  (!) 104 (!) 53   Resp:  (!) 24 19 (!) 24  Temp:      TempSrc:      SpO2: 95% 94% 94%   Weight:      Height:         Constitutional:  appears visibly short of breath and tachypneic Eyes: PERRL, lids and conjunctivae normal ENMT: Mucous membranes are moist. Posterior pharynx clear of any exudate or lesions.poor dentition Neck: normal, supple, no masses, no thyromegaly Respiratory: Increased respiratory effort, no wheezes, no crackles, coarse bilateral breath sounds. Cardiovascular: Tachycardic with a rate of around 110, irregular rhythm, no murmurs / rubs / gallops. No extremity edema. 2+ pedal pulses. No carotid bruits.  Abdomen: no tenderness, no masses palpated. No hepatosplenomegaly. Bowel sounds positive.  Musculoskeletal: no clubbing / cyanosis. No joint deformity upper and  lower extremities. Good ROM, no contractures. Normal muscle tone.  Skin: no rashes, lesions, ulcers. No induration Neurologic: Unable to fully assess given advanced dementia, however is noted to move all 4 spontaneously. Psychiatric: Mood appears stable   Labs on Admission: I have personally reviewed the following labs and imaging studies  CBC: Recent Labs  Lab 03/15/18 1328  WBC 14.7*  NEUTROABS 10.0*  HGB 13.3  HCT 42.5  MCV 98.4  PLT 283   Basic Metabolic Panel: Recent Labs  Lab 03/15/18 1328  NA 135  K 3.8  CL 104  CO2 23  GLUCOSE 106*  BUN 17  CREATININE 1.22  CALCIUM 8.8*   GFR: Estimated Creatinine Clearance: 48.3 mL/min (by C-G formula based on SCr of 1.22 mg/dL). Liver Function Tests: No results for input(s): AST, ALT, ALKPHOS, BILITOT, PROT, ALBUMIN in the last 168 hours. No results for input(s): LIPASE, AMYLASE in the last  168 hours. No results for input(s): AMMONIA in the last 168 hours. Coagulation Profile: No results for input(s): INR, PROTIME in the last 168 hours. Cardiac Enzymes: Recent Labs  Lab 03/15/18 1328  TROPONINI <0.03   BNP (last 3 results) No results for input(s): PROBNP in the last 8760 hours. HbA1C: No results for input(s): HGBA1C in the last 72 hours. CBG: No results for input(s): GLUCAP in the last 168 hours. Lipid Profile: No results for input(s): CHOL, HDL, LDLCALC, TRIG, CHOLHDL, LDLDIRECT in the last 72 hours. Thyroid Function Tests: No results for input(s): TSH, T4TOTAL, FREET4, T3FREE, THYROIDAB in the last 72 hours. Anemia Panel: No results for input(s): VITAMINB12, FOLATE, FERRITIN, TIBC, IRON, RETICCTPCT in the last 72 hours. Urine analysis:    Component Value Date/Time   COLORURINE YELLOW 12/26/2014 [place] 12/26/2014 0352   LABSPEC 1.025 12/26/2014 0352   PHURINE 6.0 12/26/2014 0352   GLUCOSEU NEGATIVE 12/26/2014 0352   HGBUR NEGATIVE 12/26/2014 0352   BILIRUBINUR NEGATIVE 12/26/2014 0352    KETONESUR NEGATIVE 12/26/2014 0352   PROTEINUR NEGATIVE 12/26/2014 0352   UROBILINOGEN 0.2 12/26/2014 0352   NITRITE NEGATIVE 12/26/2014 0352   LEUKOCYTESUR SMALL (A) 12/26/2014 0352   Sepsis Labs: @LABRCNTIP (procalcitonin:4,lacticidven:4) ) Recent Results (from the past 240 hour(s))  Culture, blood (routine x 2) Call MD if unable to obtain prior to antibiotics being given     Status: None (Preliminary result)   Collection Time: 03/15/18  3:13 PM  Result Value Ref Range Status   Specimen Description BLOOD LEFT ANTECUBITAL  Final   Special Requests   Final    BOTTLES DRAWN AEROBIC AND ANAEROBIC Blood Culture results may not be optimal due to an excessive volume of blood received in culture bottles   Culture   Final    NO GROWTH <12 HOURS Performed at [facility], [address] [place], [place] [address]    Report Status PENDING  Incomplete  Culture, blood (routine x 2) Call MD if unable to obtain prior to antibiotics being given     Status: None (Preliminary result)   Collection Time: 03/15/18  3:13 PM  Result Value Ref Range Status   Specimen Description BLOOD LEFT WRIST  Final   Special Requests   Final    BOTTLES DRAWN AEROBIC AND ANAEROBIC Blood Culture adequate volume   Culture   Final    NO GROWTH <12 HOURS Performed at [facility], [address], [place], [place] [address]    Report Status PENDING  Incomplete     Radiological Exams on Admission: Dg Chest Port 1 View  Result Date: 03/15/2018 CLINICAL DATA:  Productive cough and dyspnea EXAM: PORTABLE CHEST 1 VIEW COMPARISON:  01/31/2018 CXR and chest CT 02/03/2018 FINDINGS: Stable cardiomegaly with minimal aortic atherosclerosis. Superimposed on chronic interstitial prominence are subtle areas of confluent opacity at the left mid and lower lung. Findings are suspicious for atelectasis and/or minimal bronchopneumonia. IMPRESSION: Superimposed on chronic interstitial lung disease or or subtle airspace opacities in the left  mid and lower lung. Atelectasis and/or bronchopneumonia might account for this appearance. Stable cardiomegaly with aortic atherosclerosis. Electronically Signed   By: Ashley Royalty M.D.   On: 03/15/2018 14:30    EKG: Independently reviewed.  Atrial fibrillation, no acute ischemic changes  Assessment/Plan Principal Problem:   Acute on chronic respiratory failure with hypoxemia ([name]) Active Problems:   IPF (idiopathic pulmonary fibrosis) ([name])   HCAP (healthcare-associated pneumonia)   Essential hypertension   Chronic anticoagulation  HLD (hyperlipidemia)   CKD (chronic kidney disease) stage 3, GFR 30-59 ml/min ([name])   Acute on chronic systolic HF (heart failure) ([name])    Acute on chronic hypoxemic respiratory failure -From what I can recollect from the chart, it appears patient is supposed to be on oxygen therapy, however it is unclear to me whether he is using this more a lot. -I suspect this is due to acute pneumonia, which we will treat as H CAP given his hospitalization in early September, in addition to his IPF/ILD. Also possibly a component of acute on chronic systolic CHF. -Provide oxygen supplementation as necessary to keep sats in the low to mid 90s. -Please see below for details.  Hospital-acquired pneumonia -Check blood/sputum cultures. -Check strep pneumo urine antigen. -Start vancomycin and cefepime for hospital-acquired coverage. -Check MRSA PCR and may discontinue vancomycin if negative.  IPF/ILD -We will likely need home oxygen if he is not on this or reinforcement to use continuously if he does. -If he has not already been referred to pulmonary he needs to have a referral on discharge.  Atrial fibrillation, permanent -Rates are slightly elevated but below 110, continue metoprolol for rate control. -Continue Xarelto for anticoagulation purposes.  Acute on chronic systolic heart failure -Known ejection fraction of 35% per echo in September 2019. -Strict intake  and output/daily weights. -Lasix 40 mg IV twice daily. -This is his second hospitalization in less than 6 weeks for heart failure, I notes that his home medication regimen less Lasix as a as needed medication, should probably take a daily dose. -Is on beta-blocker, no ACE inhibitor/ARB/ARN I due to renal dysfunction.  Chronic kidney disease stage III -Creatinine is currently around his baseline of 1.2-1.5. -Continue to monitor renal function.  Hypothyroidism -Check TSH -Continue home dose of Synthroid.   DVT prophylaxis: Xarelto Code Status: Full code, presumed Family Communication: No family at bedside Disposition Plan: Would anticipate discharge home over next 48 to 72 hours with clinical improvement. Consults called: None Admission status: Admit - It is my clinical opinion that admission to INPATIENT is reasonable and necessary because of the expectation that this patient will require hospital care that crosses at least 2 midnights to treat this condition based on the medical complexity of the problems presented.  Given the aforementioned information, the predictability of an adverse outcome is felt to be significant.      Time Spent: 95 minutes  Estela Isaac Bliss MD [facility] Pager [phone]  If 7PM-7AM, please contact night-coverage [email or web address] Password [email or web address]  03/15/2018, 6:04 PM

## 2018-03-15 NOTE — ED Triage Notes (Signed)
Pt reports productive cough and SOB x 2-3 days. Pt sent over by PCP for further evaluation. Denies fever.

## 2018-03-15 NOTE — ED Notes (Signed)
ED TO INPATIENT HANDOFF REPORT  Name/Age/Gender Alejandro [name] 84 y.o. male  Code Status Code Status History    Date Active Date Inactive Code Status Order ID Comments User Context   01/31/2018 1237 02/05/2018 2351 Full Code [id number]  Rodena Goldmann, DO ED   10/14/2016 1824 10/21/2016 1830 Full Code [id number]  Orson Eva, MD Inpatient   12/25/2014 2133 12/26/2014 1421 Full Code [id number]  Waldemar Dickens, MD Inpatient   08/13/2014 0310 08/13/2014 2109 Full Code [id number]  Oswald Hillock, MD Inpatient   08/05/2014 1706 08/06/2014 1502 Full Code [id number]  Deboraha Sprang, MD Inpatient   07/31/2014 1752 08/05/2014 1706 Full Code [id number]  [name], MD Inpatient   07/30/2014 1552 07/31/2014 1752 Full Code [id number]  Erline Hau, MD Inpatient      Home/SNF/Other Home  Chief Complaint COUGH,SOB  Level of Care/Admitting Diagnosis ED Disposition    ED Disposition Condition [place]: [facility] [[id number]]  Level of Care: Telemetry [5]  Diagnosis: Acute on chronic respiratory failure with hypoxemia [facility]) [[id number]]  Admitting Physician: Erline Hau [[id number]]  Attending Physician: Erline Hau [[id number]]  Estimated length of stay: 3 - 4 days  Certification:: I certify this patient will need inpatient services for at least 2 midnights  PT Class (Do Not Modify): Inpatient [101]  PT Acc Code (Do Not Modify): Private [1]       Medical History Past Medical History:  Diagnosis Date  . Alzheimer's dementia ([place])   . CAD (coronary artery disease)    a. 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 Med rx recommended, EF 30-35% at that cath.  . CHF (congestive heart failure) ([place])   . Chronic shortness of breath   . CKD (chronic  kidney disease), stage III ([place])   . Essential hypertension   . ILD (interstitial lung disease) ([place]) 01/2018   via CT scan   . Memory loss   . Non-ischemic cardiomyopathy ([place])    EF 35%  . NSVT (nonsustained ventricular tachycardia) ([name])    Negative EP study  . On home O2    prn during day, continuous qhs  . Orthostatic hypotension   . Paroxysmal atrial fibrillation ([name])   . Secondary cardiomyopathy ([place])    LVEF 40-45% January 2014  . Syncope   . TIA (transient ischemic attack)   . Uses walker     Allergies No Known Allergies  IV Location/Drains/Wounds Patient Lines/Drains/Airways Status   Active Line/Drains/Airways    None          Labs/Imaging Results for orders placed or performed during the hospital encounter of 03/15/18 (from the past 48 hour(s))  Basic metabolic panel     Status: Abnormal   Collection Time: 03/15/18  1:28 PM  Result Value Ref Range   Sodium 135 135 - 145 mmol/L   Potassium 3.8 3.5 - 5.1 mmol/L   Chloride 104 98 - 111 mmol/L   CO2 23 22 - 32 mmol/L   Glucose, Bld 106 (H) 70 - 99 mg/dL   BUN 17 8 - 23 mg/dL   Creatinine, Ser 1.22 0.61 - 1.24 mg/dL   Calcium 8.8 (L) 8.9 - 10.3 mg/dL   GFR calc non Af Amer 53 (L) >60 mL/min   GFR calc Af Amer >60 >  60 mL/min    Comment: (NOTE) The eGFR has been calculated using the CKD EPI equation. This calculation has not been validated in all clinical situations. eGFR's persistently <60 mL/min signify possible Chronic Kidney Disease.    Anion gap 8 5 - 15    Comment: Performed at [facility], [address] [place], [place] [address]  Brain natriuretic peptide     Status: Abnormal   Collection Time: 03/15/18  1:28 PM  Result Value Ref Range   B Natriuretic Peptide 1,477.0 (H) 0.0 - 100.0 pg/mL    Comment: Performed at [facility], [address], [place], [place] [address]  Troponin I     Status: None   Collection Time: 03/15/18  1:28 PM  Result Value Ref Range   Troponin I <0.03 <0.03 ng/mL     Comment: Performed at [facility], [address], [place], [place] [address]  CBC with Differential     Status: Abnormal   Collection Time: 03/15/18  1:28 PM  Result Value Ref Range   WBC 14.7 (H) 4.0 - 10.5 K/uL    Comment: [place] COUNT CONFIRMED ON SMEAR   RBC 4.32 4.22 - 5.81 MIL/uL   Hemoglobin 13.3 13.0 - 17.0 g/dL   HCT 42.5 39.0 - 52.0 %   MCV 98.4 80.0 - 100.0 fL   MCH 30.8 26.0 - 34.0 pg   MCHC 31.3 30.0 - 36.0 g/dL   RDW 14.3 11.5 - 15.5 %   Platelets 299 150 - 400 K/uL   nRBC 0.0 0.0 - 0.2 %   Neutrophils Relative % 68 %   Neutro Abs 10.0 (H) 1.7 - 7.7 K/uL   Lymphocytes Relative 18 %   Lymphs Abs 2.6 0.7 - 4.0 K/uL   Monocytes Relative 9 %   Monocytes Absolute 1.3 (H) 0.1 - 1.0 K/uL   Eosinophils Relative 4 %   Eosinophils Absolute 0.6 (H) 0.0 - 0.5 K/uL   Basophils Relative 1 %   Basophils Absolute 0.1 0.0 - 0.1 K/uL   Immature Granulocytes 0 %   Abs Immature Granulocytes 0.05 0.00 - 0.07 K/uL   Reactive, Benign Lymphocytes PRESENT     Comment: Performed at [facility], [address], [place], [place] [address]  Culture, blood (routine x 2) Call MD if unable to obtain prior to antibiotics being given     Status: None (Preliminary result)   Collection Time: 03/15/18  3:13 PM  Result Value Ref Range   Specimen Description BLOOD LEFT ANTECUBITAL    Special Requests      BOTTLES DRAWN AEROBIC AND ANAEROBIC Blood Culture results may not be optimal due to an excessive volume of blood received in culture bottles   Culture      NO GROWTH <12 HOURS Performed at [facility], [address], [place], [place] [address]    Report Status PENDING   Culture, blood (routine x 2) Call MD if unable to obtain prior to antibiotics being given     Status: None (Preliminary result)   Collection Time: 03/15/18  3:13 PM  Result Value Ref Range   Specimen Description BLOOD LEFT WRIST    Special Requests      BOTTLES DRAWN AEROBIC AND ANAEROBIC Blood Culture adequate volume    Culture      NO GROWTH <12 HOURS Performed at [facility], [address] [place],  [address]    Report Status PENDING    Dg Chest Port 1 View  Result Date: 03/15/2018 CLINICAL DATA:  Productive cough and dyspnea EXAM: PORTABLE CHEST 1 VIEW COMPARISON:  01/31/2018 CXR and chest CT 02/03/2018 FINDINGS: Stable cardiomegaly with minimal aortic atherosclerosis. Superimposed on chronic interstitial prominence are subtle areas of confluent opacity at the left mid and lower lung. Findings are suspicious for atelectasis and/or minimal bronchopneumonia. IMPRESSION: Superimposed on chronic interstitial lung disease or or subtle airspace opacities in the left mid and lower lung. Atelectasis and/or bronchopneumonia might account for this appearance. Stable cardiomegaly with aortic atherosclerosis. Electronically Signed   By: Ashley Royalty M.D.   On: 03/15/2018 14:30    Pending Labs Unresulted Labs (From admission, onward)    Start     Ordered   03/17/18 0500  Creatinine, serum  Every Mon-Wed-Fri (0500),   R     03/15/18 1559   Signed and Held  HIV antibody (Routine Screening)  Once,   R     Signed and Held   Signed and Held  Culture, blood (routine x 2) Call MD if unable to obtain prior to antibiotics being given  BLOOD CULTURE X 2,   R    Comments:  If blood cultures drawn in Emergency Department - Do not draw and cancel order    Signed and Held   Signed and Held  Culture, sputum-assessment  Once,   R     Signed and Held   Signed and Held  Gram stain  Once,   R     Signed and Held   Signed and Held  Strep pneumoniae urinary antigen  Once,   R     Signed and Held   Visual merchandiser and Held  Basic metabolic panel  Daily,   R     Signed and Held   Signed and Held  TSH  Once,   R     Signed and Held   Signed and Held  Basic metabolic panel  Tomorrow morning,   R     Signed and Held   Signed and Held  CBC  Tomorrow morning,   R     Signed and Held   Signed and Held  Influenza panel by PCR (type A &  B)  (Influenza PCR Panel)  Once,   R     Signed and Held          Vitals/Pain Today's Vitals   03/15/18 1345 03/15/18 1400 03/15/18 1728 03/15/18 1730  BP:  100/75  (!) 126/91  Pulse:  (!) 104 (!) 53   Resp:  (!) 24 19 (!) 24  Temp:      TempSrc:      SpO2: 95% 94% 94%   Weight:      Height:      PainSc:        Isolation Precautions No active isolations  Medications Medications  ceFEPIme (MAXIPIME) 1 g in sodium chloride 0.9 % 100 mL IVPB (0 g Intravenous Stopped 03/15/18 1729)  vancomycin (VANCOCIN) 1,500 mg in sodium chloride 0.9 % 500 mL IVPB (1,500 mg Intravenous New Bag/Given 03/15/18 1730)  vancomycin (VANCOCIN) 1,250 mg in sodium chloride 0.9 % 250 mL IVPB (has no administration in time range)  ipratropium-albuterol (DUONEB) 0.5-2.5 (3) MG/3ML nebulizer solution 3 mL (3 mLs Nebulization Given 03/15/18 1344)  albuterol (PROVENTIL) (2.5 MG/3ML) 0.083% nebulizer solution 2.5 mg (2.5 mg Nebulization Given 03/15/18 1344)  furosemide (LASIX) injection 40 mg (40 mg Intravenous Given 03/15/18 1428)    Mobility walks

## 2018-03-16 LAB — HIV ANTIBODY (ROUTINE TESTING W REFLEX): HIV Screen 4th Generation wRfx: NONREACTIVE

## 2018-03-16 LAB — CBC
HEMATOCRIT: 40.5 % (ref 39.0–52.0)
Hemoglobin: 12.8 g/dL — ABNORMAL LOW (ref 13.0–17.0)
MCH: 31.2 pg (ref 26.0–34.0)
MCHC: 31.6 g/dL (ref 30.0–36.0)
MCV: 98.8 fL (ref 80.0–100.0)
NRBC: 0 % (ref 0.0–0.2)
Platelets: 267 10*3/uL (ref 150–400)
RBC: 4.1 MIL/uL — AB (ref 4.22–5.81)
RDW: 14 % (ref 11.5–15.5)
WBC: 12.1 10*3/uL — AB (ref 4.0–10.5)

## 2018-03-16 LAB — BASIC METABOLIC PANEL
Anion gap: 9 (ref 5–15)
BUN: 18 mg/dL (ref 8–23)
CO2: 25 mmol/L (ref 22–32)
CREATININE: 1.39 mg/dL — AB (ref 0.61–1.24)
Calcium: 8.7 mg/dL — ABNORMAL LOW (ref 8.9–10.3)
Chloride: 104 mmol/L (ref 98–111)
GFR calc Af Amer: 52 mL/min — ABNORMAL LOW (ref >60)
GFR, EST NON AFRICAN AMERICAN: 45 mL/min — AB (ref >60)
GLUCOSE: 111 mg/dL — AB (ref 70–99)
POTASSIUM: 3.5 mmol/L (ref 3.5–5.1)
Sodium: 138 mmol/L (ref 135–145)

## 2018-03-16 LAB — MRSA PCR SCREENING: MRSA by PCR: NEGATIVE

## 2018-03-16 LAB — STREP PNEUMONIAE URINARY ANTIGEN: STREP PNEUMO URINARY ANTIGEN: NEGATIVE

## 2018-03-16 MED ORDER — FUROSEMIDE 20 MG PO TABS
20.0000 mg | ORAL_TABLET | Freq: Every day | ORAL | Status: DC
  Administered 2018-03-17: 20 mg via ORAL
  Filled 2018-03-16: qty 1

## 2018-03-16 MED ORDER — SODIUM CHLORIDE 0.9 % IV SOLN
1.0000 g | Freq: Two times a day (BID) | INTRAVENOUS | Status: DC
  Administered 2018-03-16 – 2018-03-21 (×10): 1 g via INTRAVENOUS
  Filled 2018-03-16 (×13): qty 1

## 2018-03-16 MED ORDER — FUROSEMIDE 20 MG PO TABS: 20.0000 mg | ORAL_TABLET | Freq: Every day | ORAL | Status: DC

## 2018-03-16 MED ORDER — METOPROLOL TARTRATE 5 MG/5ML IV SOLN: 5.0000 mg | Freq: Three times a day (TID) | INTRAVENOUS | Status: DC | PRN

## 2018-03-16 MED ORDER — SODIUM CHLORIDE 0.9 % IN NEBU
INHALATION_SOLUTION | RESPIRATORY_TRACT | Status: AC
  Administered 2018-03-16: 3 mL
  Filled 2018-03-16: qty 3

## 2018-03-16 MED ORDER — LEVALBUTEROL HCL 1.25 MG/0.5ML IN NEBU
1.2500 mg | INHALATION_SOLUTION | Freq: Four times a day (QID) | RESPIRATORY_TRACT | Status: DC | PRN
  Administered 2018-03-16: 1.25 mg via RESPIRATORY_TRACT
  Filled 2018-03-16: qty 0.5

## 2018-03-16 NOTE — Progress Notes (Signed)
Pt's BP 50/40. Mid Level paged. Order for 500 bolus received and carried out. BP went up to 92/74. Will continue to monitor pt.

## 2018-03-16 NOTE — Care Management Note (Signed)
Case Management Note  Patient Details  Name: Alejandro [name] MRN: [id number] Date of Birth: August 15, 1932  Subjective/Objective:    Admitted with pneumonia. Pt from home, lives with wife, ind with ADL's. Pt is active with [facility] services. Has home oxygen and neb machine through [facility]. Has had 2 admissions and 3 ED visits in last 6 months, not on [facility] registry.                 Action/Plan: Plan to return home with resumption of [place] services.   Expected Discharge Date:     03/16/18             Expected Discharge Plan:  [place]  In-House Referral:  NA  Discharge planning Services  CM Consult  Post Acute Care Choice:  Home Health, Resumption of Svcs/PTA Provider Choice offered to:  Patient  HH Arranged:  RN [facility] Agency:  [place]  Status of Service:  In process, will continue to follow Sherald Barge, RN 03/16/2018, 1:25 PM

## 2018-03-16 NOTE — Progress Notes (Signed)
PROGRESS NOTE    Alejandro [name]  RXV:[id number] DOB: 1932/11/03 DOA: 03/15/2018 PCP: Glenda Chroman, MD     Brief Narrative:  84 year old man admitted from home on 10/16 with complaints of shortness of breath was found to be hypoxemic.  This is believed to be due to a combination of hospital-acquired pneumonia as well as acute on chronic heart failure on top of baseline interstitial lung disease/pulmonary fibrosis.  Admission has been requested for further evaluation and management.   Assessment & Plan:   Principal Problem:   Acute on chronic respiratory failure with hypoxemia ([name]) Active Problems:   IPF (idiopathic pulmonary fibrosis) ([name])   HCAP (healthcare-associated pneumonia)   Essential hypertension   Chronic anticoagulation   HLD (hyperlipidemia)   CKD (chronic kidney disease) stage 3, GFR 30-59 ml/min ([name])   Acute on chronic systolic HF (heart failure) ([name])   Acute on chronic hypoxemic respiratory failure -Due to a combination of hospital-acquired pneumonia and acute on chronic systolic heart failure on top of baseline interstitial lung disease/pulmonary fibrosis. -Continue oxygen as tolerated, see below for details.  Hospital-acquired pneumonia -Culture data remains pending. -MRSA PCR is negative, as such we will discontinue vancomycin.  Continue cefepime alone for now.  Acute on chronic systolic heart failure -With a known ejection fraction of 35% per echo in September 2019. -Patient is approximately 2 L negative since admission. -As he has been hypotensive and his renal function is starting to climb, will discontinue IV Lasix and placed on Lasix 20 mg orally daily. -He would benefit from home health RN upon discharge given his second hospitalization in 6 weeks for heart failure. -Continue beta-blocker, no ACE inhibitor/ARB/ARN I due to renal dysfunction.  Chronic kidney disease stage III -Although creatinine remains at his baseline, and is slightly increased  from yesterday. -We will hold off on IV diuretics and transition to p.o. today.  Hypothyroidism -TSH within normal limits at 2.052, continue home dose of Synthroid.  Interstitial lung disease/pulmonary fibrosis -Will need to continue home oxygen. -Needs outpatient pulmonary referral.  Permanent atrial fibrillation -Rate controlled, continue Xarelto for anticoagulation.   DVT prophylaxis: Xarelto Code Status: Full code Family Communication: Wife at bedside updated on plan of care and all questions answered Disposition Plan: Hope for discharge home over next 24 to 48 hours with continued clinical improvement  Consultants:   None  Procedures:   None  Antimicrobials:  Anti-infectives (From admission, onward)   Start     Dose/Rate Route Frequency Ordered Stop   03/16/18 1800  ceFEPIme (MAXIPIME) 1 g in sodium chloride 0.9 % 100 mL IVPB     1 g 200 mL/hr over 30 Minutes Intravenous Every 12 hours 03/16/18 0857     03/16/18 1700  vancomycin (VANCOCIN) 1,250 mg in sodium chloride 0.9 % 250 mL IVPB  Status:  Discontinued     1,250 mg 166.7 mL/hr over 90 Minutes Intravenous Every 24 hours 03/15/18 1559 03/15/18 1853   03/15/18 2200  ceFEPIme (MAXIPIME) 1 g in sodium chloride 0.9 % 100 mL IVPB  Status:  Discontinued     1 g 200 mL/hr over 30 Minutes Intravenous Every 8 hours 03/15/18 1853 03/16/18 0857   03/15/18 1500  vancomycin (VANCOCIN) 1,500 mg in sodium chloride 0.9 % 500 mL IVPB     1,500 mg 250 mL/hr over 120 Minutes Intravenous  Once 03/15/18 1440 03/15/18 1930   03/15/18 1445  ceFEPIme (MAXIPIME) 1 g in sodium chloride 0.9 % 100 mL IVPB  Status:  Discontinued     1 g 200 mL/hr over 30 Minutes Intravenous Every 8 hours 03/15/18 1434 03/15/18 1853       Subjective: Lying in bed, pleasant, no complaints, denies shortness of breath, complains of weakness.  Objective: Vitals:   03/16/18 0607 03/16/18 0858 03/16/18 1316 03/16/18 1320  BP: 100/66 (!) 116/92 (!) 89/62  (!) 100/58  Pulse: (!) 51 (!) 57 (!) 54   Resp: 12  16   Temp: 98.5 F (36.9 C)  98.7 F (37.1 C)   TempSrc: Oral  Oral   SpO2: 92%  97%   Weight:      Height:        Intake/Output Summary (Last 24 hours) at 03/16/2018 1431 Last data filed at 03/16/2018 1005 Gross per 24 hour  Intake 340 ml  Output 2725 ml  Net -2385 ml   Filed Weights   03/15/18 1319 03/15/18 1856 03/16/18 0500  Weight: 83.5 kg 83.5 kg 76.2 kg    Examination:  General exam: Alert, awake, oriented x 2, pleasant and cooperative with exam Respiratory system: Bilateral dry crackles respiratory effort normal. Cardiovascular system:RRR. No murmurs, rubs, gallops. Gastrointestinal system: Abdomen is nondistended, soft and nontender. No organomegaly or masses felt. Normal bowel sounds heard. Central nervous system: Alert and oriented. No focal neurological deficits. Extremities: No C/C/E, +pedal pulses Skin: No rashes, lesions or ulcers Psychiatry: Judgement and insight appear normal. Mood & affect appropriate.     Data Reviewed: I have personally reviewed following labs and imaging studies  CBC: Recent Labs  Lab 03/15/18 1328 03/16/18 0610  WBC 14.7* 12.1*  NEUTROABS 10.0*  --   HGB 13.3 12.8*  HCT 42.5 40.5  MCV 98.4 98.8  PLT 299 063   Basic Metabolic Panel: Recent Labs  Lab 03/15/18 1328 03/16/18 0610  NA 135 138  K 3.8 3.5  CL 104 104  CO2 23 25  GLUCOSE 106* 111*  BUN 17 18  CREATININE 1.22 1.39*  CALCIUM 8.8* 8.7*   GFR: Estimated Creatinine Clearance: 39 mL/min (A) (by C-G formula based on SCr of 1.39 mg/dL (H)). Liver Function Tests: No results for input(s): AST, ALT, ALKPHOS, BILITOT, PROT, ALBUMIN in the last 168 hours. No results for input(s): LIPASE, AMYLASE in the last 168 hours. No results for input(s): AMMONIA in the last 168 hours. Coagulation Profile: No results for input(s): INR, PROTIME in the last 168 hours. Cardiac Enzymes: Recent Labs  Lab 03/15/18 1328    TROPONINI <0.03   BNP (last 3 results) No results for input(s): PROBNP in the last 8760 hours. HbA1C: No results for input(s): HGBA1C in the last 72 hours. CBG: Recent Labs  Lab 03/15/18 2231  GLUCAP 116*   Lipid Profile: No results for input(s): CHOL, HDL, LDLCALC, TRIG, CHOLHDL, LDLDIRECT in the last 72 hours. Thyroid Function Tests: Recent Labs    03/15/18 1328  TSH 2.052   Anemia Panel: No results for input(s): VITAMINB12, FOLATE, FERRITIN, TIBC, IRON, RETICCTPCT in the last 72 hours. Urine analysis:    Component Value Date/Time   COLORURINE YELLOW 12/26/2014 0352   APPEARANCEUR CLEAR 12/26/2014 0352   LABSPEC 1.025 12/26/2014 0352   PHURINE 6.0 12/26/2014 0352   GLUCOSEU NEGATIVE 12/26/2014 0352   HGBUR NEGATIVE 12/26/2014 0352   BILIRUBINUR NEGATIVE 12/26/2014 0352   KETONESUR NEGATIVE 12/26/2014 0352   PROTEINUR NEGATIVE 12/26/2014 0352   UROBILINOGEN 0.2 12/26/2014 0352   NITRITE NEGATIVE 12/26/2014 0352   LEUKOCYTESUR SMALL (A) 12/26/2014 0352   Sepsis Labs: @LABRCNTIP (procalcitonin:4,lacticidven:4)  )  Recent Results (from the past 240 hour(s))  Culture, blood (routine x 2) Call MD if unable to obtain prior to antibiotics being given     Status: None (Preliminary result)   Collection Time: 03/15/18  3:13 PM  Result Value Ref Range Status   Specimen Description BLOOD LEFT ANTECUBITAL  Final   Special Requests   Final    BOTTLES DRAWN AEROBIC AND ANAEROBIC Blood Culture results may not be optimal due to an excessive volume of blood received in culture bottles   Culture   Final    NO GROWTH < 24 HOURS Performed at [facility], [address] [place], [place] [address]    Report Status PENDING  Incomplete  Culture, blood (routine x 2) Call MD if unable to obtain prior to antibiotics being given     Status: None (Preliminary result)   Collection Time: 03/15/18  3:13 PM  Result Value Ref Range Status   Specimen Description BLOOD LEFT WRIST  Final    Special Requests   Final    BOTTLES DRAWN AEROBIC AND ANAEROBIC Blood Culture adequate volume   Culture   Final    NO GROWTH < 24 HOURS Performed at [facility], [address], [place], [place] [address]    Report Status PENDING  Incomplete  MRSA PCR Screening     Status: None   Collection Time: 03/15/18 10:42 PM  Result Value Ref Range Status   MRSA by PCR NEGATIVE NEGATIVE Final    Comment:        The GeneXpert MRSA Assay (FDA approved for NASAL specimens only), is one component of a comprehensive MRSA colonization surveillance program. It is not intended to diagnose MRSA infection nor to guide or monitor treatment for MRSA infections. Performed at [facility], [address], [place], [place] [address]          Radiology Studies: Dg Chest [facility] 1 View  Result Date: 03/15/2018 CLINICAL DATA:  Productive cough and dyspnea EXAM: PORTABLE CHEST 1 VIEW COMPARISON:  01/31/2018 CXR and chest CT 02/03/2018 FINDINGS: Stable cardiomegaly with minimal aortic atherosclerosis. Superimposed on chronic interstitial prominence are subtle areas of confluent opacity at the left mid and lower lung. Findings are suspicious for atelectasis and/or minimal bronchopneumonia. IMPRESSION: Superimposed on chronic interstitial lung disease or or subtle airspace opacities in the left mid and lower lung. Atelectasis and/or bronchopneumonia might account for this appearance. Stable cardiomegaly with aortic atherosclerosis. Electronically Signed   By: Ashley Royalty M.D.   On: 03/15/2018 14:30        Scheduled Meds: . aspirin EC  81 mg Oral Daily  . furosemide  20 mg Oral Daily  . levothyroxine  25 mcg Oral QAC breakfast  . mouth rinse  15 mL Mouth Rinse BID  . metoprolol tartrate  25 mg Oral BID  . multivitamin with minerals   Oral Daily  . Rivaroxaban  15 mg Oral Q supper  . sodium chloride flush  3 mL Intravenous Q12H   Continuous Infusions: . sodium chloride    . ceFEPime (MAXIPIME) IV        LOS: 1 day    Time spent: 35 minutes. Greater than 50% of this time was spent in direct contact with the patient and with patient's wife, coordinating care and discussing relevant ongoing clinical issues, including plan to transition diuretic therapy to oral, plan to continue IV antibiotics today and hopefully transition to p.o. route if signs of continued improvement for potential discharge home tomorrow.  Lelon Frohlich, MD [facility] Pager [phone]  If 7PM-7AM, please contact night-coverage [email or web address] Password [email or web address] 03/16/2018, 2:31 PM

## 2018-03-17 LAB — CBC
HCT: 42 % (ref 39.0–52.0)
Hemoglobin: 13.4 g/dL (ref 13.0–17.0)
MCH: 31.1 pg (ref 26.0–34.0)
MCHC: 31.9 g/dL (ref 30.0–36.0)
MCV: 97.4 fL (ref 80.0–100.0)
NRBC: 0 % (ref 0.0–0.2)
Platelets: 267 10*3/uL (ref 150–400)
RBC: 4.31 MIL/uL (ref 4.22–5.81)
RDW: 13.8 % (ref 11.5–15.5)
WBC: 10.7 10*3/uL — AB (ref 4.0–10.5)

## 2018-03-17 LAB — BASIC METABOLIC PANEL
ANION GAP: 7 (ref 5–15)
BUN: 26 mg/dL — ABNORMAL HIGH (ref 8–23)
CO2: 23 mmol/L (ref 22–32)
CREATININE: 1.31 mg/dL — AB (ref 0.61–1.24)
Calcium: 9 mg/dL (ref 8.9–10.3)
Chloride: 107 mmol/L (ref 98–111)
GFR, EST AFRICAN AMERICAN: 56 mL/min — AB (ref >60)
GFR, EST NON AFRICAN AMERICAN: 49 mL/min — AB (ref >60)
Glucose, Bld: 117 mg/dL — ABNORMAL HIGH (ref 70–99)
Potassium: 3.6 mmol/L (ref 3.5–5.1)
SODIUM: 137 mmol/L (ref 135–145)

## 2018-03-17 MED ORDER — METOPROLOL TARTRATE 25 MG PO TABS
12.5000 mg | ORAL_TABLET | Freq: Two times a day (BID) | ORAL | Status: DC
  Filled 2018-03-17 (×3): qty 1

## 2018-03-17 MED ORDER — SODIUM CHLORIDE 0.9 % IV BOLUS
250.0000 mL | Freq: Once | INTRAVENOUS | Status: AC
  Administered 2018-03-17: via INTRAVENOUS

## 2018-03-17 MED ORDER — ASPIRIN 81 MG PO TBEC: 81.0000 mg | DELAYED_RELEASE_TABLET | Freq: Every day | ORAL | Status: DC

## 2018-03-17 MED ORDER — SODIUM CHLORIDE 0.9 % IV BOLUS
500.0000 mL | Freq: Once | INTRAVENOUS | Status: AC
  Administered 2018-03-17: 500 mL via INTRAVENOUS

## 2018-03-17 MED ORDER — FUROSEMIDE 20 MG PO TABS: 20.0000 mg | ORAL_TABLET | Freq: Every day | ORAL | 2 refills | Status: DC

## 2018-03-17 MED ORDER — AMOXICILLIN-POT CLAVULANATE 500-125 MG PO TABS: 1.0000 | ORAL_TABLET | Freq: Two times a day (BID) | ORAL | 0 refills | Status: DC

## 2018-03-17 NOTE — Progress Notes (Signed)
Pt spouse made aware of patient's cancelled discharge.

## 2018-03-17 NOTE — Progress Notes (Signed)
Patients BP decreased to 72/50 taken manually. Patient complaining of dizziness and stated that he felt he was going to faint. Mid Level notified. New orders placed. Patient instructed to call if he needs anything, bed alarm on and call bell within reach.

## 2018-03-17 NOTE — Care Management Important Message (Signed)
Important Message  Patient Details  Name: Alejandro [name] MRN: [id number] Date of Birth: Feb 19, 1933   Medicare Important Message Given:  Yes    Sherald Barge, RN 03/17/2018, 12:10 PM

## 2018-03-17 NOTE — Progress Notes (Signed)
PROGRESS NOTE    Alejandro [name]  RXV:[id number] DOB: 1933/01/06 DOA: 03/15/2018 PCP: Glenda Chroman, MD     Brief Narrative:  84 year old man admitted from home on 10/16 with complaints of shortness of breath was found to be hypoxemic.  This is believed to be due to a combination of hospital-acquired pneumonia as well as acute on chronic heart failure on top of baseline interstitial lung disease/pulmonary fibrosis.  Admission has been requested for further evaluation and management.   Assessment & Plan:   Principal Problem:   Acute on chronic respiratory failure with hypoxemia ([name]) Active Problems:   IPF (idiopathic pulmonary fibrosis) ([name])   HCAP (healthcare-associated pneumonia)   Essential hypertension   Chronic anticoagulation   HLD (hyperlipidemia)   CKD (chronic kidney disease) stage 3, GFR 30-59 ml/min ([name])   Acute on chronic systolic HF (heart failure) ([name])   Acute on chronic hypoxemic respiratory failure -Due to a combination of hospital-acquired pneumonia and acute on chronic systolic heart failure on top of baseline interstitial lung disease/pulmonary fibrosis. -Continue oxygen as tolerated, see below for details.  Hospital-acquired pneumonia -Culture data remains pending. -MRSA PCR is negative, as such we will discontinue vancomycin.  Continue cefepime alone for now.  Acute on chronic systolic heart failure -With a known ejection fraction of 35% per echo in September 2019. -Patient is approximately 2 L negative since admission. -Patient's blood pressure had been in the 509 systolic, because of this he was discharged home.  After discharge received a call from RN that as he was being wheeled out of his room he complained of being dizzy, subsequent blood pressure was found to be in the 32I to 71I systolic.  Asked RN to hold discharge, give a 500 cc bolus, hold IV metoprolol and recheck blood pressure afterwards.  Will also decrease p.o. metoprolol dose from 25-12.5  twice daily. -He would benefit from home health RN upon discharge given his second hospitalization in 6 weeks for heart failure. -Continue beta-blocker, no ACE inhibitor/ARB/ARN I due to renal dysfunction.  Chronic kidney disease stage III -Cr remains at baseline.  Hypothyroidism -TSH within normal limits at 2.052, continue home dose of Synthroid.  Interstitial lung disease/pulmonary fibrosis -Will need to continue home oxygen. -Needs outpatient pulmonary referral.  Permanent atrial fibrillation -Rate controlled, continue Xarelto for anticoagulation.   DVT prophylaxis: Xarelto Code Status: Full code Family Communication: Wife at bedside updated on plan of care and all questions answered Disposition Plan: Hope for discharge home over next 24 to 48 hours with continued clinical improvement and better BP management.  Consultants:   None  Procedures:   None  Antimicrobials:  Anti-infectives (From admission, onward)   Start     Dose/Rate Route Frequency Ordered Stop   03/17/18 0000  amoxicillin-clavulanate (AUGMENTIN) 500-125 MG tablet     1 tablet Oral 2 times daily 03/17/18 1329 03/22/18 2359   03/16/18 1800  ceFEPIme (MAXIPIME) 1 g in sodium chloride 0.9 % 100 mL IVPB     1 g 200 mL/hr over 30 Minutes Intravenous Every 12 hours 03/16/18 0857     03/16/18 1700  vancomycin (VANCOCIN) 1,250 mg in sodium chloride 0.9 % 250 mL IVPB  Status:  Discontinued     1,250 mg 166.7 mL/hr over 90 Minutes Intravenous Every 24 hours 03/15/18 1559 03/15/18 1853   03/15/18 2200  ceFEPIme (MAXIPIME) 1 g in sodium chloride 0.9 % 100 mL IVPB  Status:  Discontinued     1 g 200 mL/hr over  30 Minutes Intravenous Every 8 hours 03/15/18 1853 03/16/18 0857   03/15/18 1500  vancomycin (VANCOCIN) 1,500 mg in sodium chloride 0.9 % 500 mL IVPB     1,500 mg 250 mL/hr over 120 Minutes Intravenous  Once 03/15/18 1440 03/15/18 1930   03/15/18 1445  ceFEPIme (MAXIPIME) 1 g in sodium chloride 0.9 % 100 mL  IVPB  Status:  Discontinued     1 g 200 mL/hr over 30 Minutes Intravenous Every 8 hours 03/15/18 1434 03/15/18 1853       Subjective: Lying in bed, wife at bedside, no complaints states he feels much better and would like to go home  Objective: Vitals:   03/17/18 1403 03/17/18 1406 03/17/18 1456 03/17/18 1458  BP: 100/61 (!) 70/59 (!) 80/58 (!) 74/48  Pulse:   (!) 51 (!) 52  Resp:      Temp:    98.2 F (36.8 C)  TempSrc:    Oral  SpO2: 97% 97% 95% 94%  Weight:      Height:        Intake/Output Summary (Last 24 hours) at 03/17/2018 1616 Last data filed at 03/16/2018 2137 Gross per 24 hour  Intake 460.23 ml  Output 150 ml  Net 310.23 ml   Filed Weights   03/15/18 1856 03/16/18 0500 03/17/18 0700  Weight: 83.5 kg 76.2 kg 75.6 kg    Examination:  General exam: Alert, awake, oriented x 3 Respiratory system: Clear to auscultation. Respiratory effort normal. Cardiovascular system:RRR. No murmurs, rubs, gallops. Gastrointestinal system: Abdomen is nondistended, soft and nontender. No organomegaly or masses felt. Normal bowel sounds heard. Central nervous system: Alert and oriented. No focal neurological deficits. Extremities: No C/C/E, +pedal pulses Skin: No rashes, lesions or ulcers Psychiatry: Judgement and insight appear normal. Mood & affect appropriate.      Data Reviewed: I have personally reviewed following labs and imaging studies  CBC: Recent Labs  Lab 03/15/18 1328 03/16/18 0610 03/17/18 0535  WBC 14.7* 12.1* 10.7*  NEUTROABS 10.0*  --   --   HGB 13.3 12.8* 13.4  HCT 42.5 40.5 42.0  MCV 98.4 98.8 97.4  PLT 299 267 751   Basic Metabolic Panel: Recent Labs  Lab 03/15/18 1328 03/16/18 0610 03/17/18 0535  NA 135 138 137  K 3.8 3.5 3.6  CL 104 104 107  CO2 23 25 23   GLUCOSE 106* 111* 117*  BUN 17 18 26*  CREATININE 1.22 1.39* 1.31*  CALCIUM 8.8* 8.7* 9.0   GFR: Estimated Creatinine Clearance: 41.3 mL/min (A) (by C-G formula based on SCr of  1.31 mg/dL (H)). Liver Function Tests: No results for input(s): AST, ALT, ALKPHOS, BILITOT, PROT, ALBUMIN in the last 168 hours. No results for input(s): LIPASE, AMYLASE in the last 168 hours. No results for input(s): AMMONIA in the last 168 hours. Coagulation Profile: No results for input(s): INR, PROTIME in the last 168 hours. Cardiac Enzymes: Recent Labs  Lab 03/15/18 1328  TROPONINI <0.03   BNP (last 3 results) No results for input(s): PROBNP in the last 8760 hours. HbA1C: No results for input(s): HGBA1C in the last 72 hours. CBG: Recent Labs  Lab 03/15/18 2231  GLUCAP 116*   Lipid Profile: No results for input(s): CHOL, HDL, LDLCALC, TRIG, CHOLHDL, LDLDIRECT in the last 72 hours. Thyroid Function Tests: Recent Labs    03/15/18 1328  TSH 2.052   Anemia Panel: No results for input(s): VITAMINB12, FOLATE, FERRITIN, TIBC, IRON, RETICCTPCT in the last 72 hours. Urine analysis:  Component Value Date/Time   COLORURINE YELLOW 12/26/2014 0352   APPEARANCEUR CLEAR 12/26/2014 0352   LABSPEC 1.025 12/26/2014 0352   PHURINE 6.0 12/26/2014 0352   GLUCOSEU NEGATIVE 12/26/2014 0352   HGBUR NEGATIVE 12/26/2014 0352   BILIRUBINUR NEGATIVE 12/26/2014 0352   KETONESUR NEGATIVE 12/26/2014 0352   PROTEINUR NEGATIVE 12/26/2014 0352   UROBILINOGEN 0.2 12/26/2014 0352   NITRITE NEGATIVE 12/26/2014 0352   LEUKOCYTESUR SMALL (A) 12/26/2014 0352   Sepsis Labs: @LABRCNTIP (procalcitonin:4,lacticidven:4)  ) Recent Results (from the past 240 hour(s))  Culture, blood (routine x 2) Call MD if unable to obtain prior to antibiotics being given     Status: None (Preliminary result)   Collection Time: 03/15/18  3:13 PM  Result Value Ref Range Status   Specimen Description BLOOD LEFT ANTECUBITAL  Final   Special Requests   Final    BOTTLES DRAWN AEROBIC AND ANAEROBIC Blood Culture results may not be optimal due to an excessive volume of blood received in culture bottles   Culture   Final      NO GROWTH 2 DAYS Performed at [facility], [address], [place], [place] [address]    Report Status PENDING  Incomplete  Culture, blood (routine x 2) Call MD if unable to obtain prior to antibiotics being given     Status: None (Preliminary result)   Collection Time: 03/15/18  3:13 PM  Result Value Ref Range Status   Specimen Description BLOOD LEFT WRIST  Final   Special Requests   Final    BOTTLES DRAWN AEROBIC AND ANAEROBIC Blood Culture adequate volume   Culture   Final    NO GROWTH 2 DAYS Performed at [facility], [address], [place], [place] [address]    Report Status PENDING  Incomplete  MRSA PCR Screening     Status: None   Collection Time: 03/15/18 10:42 PM  Result Value Ref Range Status   MRSA by PCR NEGATIVE NEGATIVE Final    Comment:        The GeneXpert MRSA Assay (FDA approved for NASAL specimens only), is one component of a comprehensive MRSA colonization surveillance program. It is not intended to diagnose MRSA infection nor to guide or monitor treatment for MRSA infections. Performed at [facility], [address], [place], [place] [address]          Radiology Studies: No results found.      Scheduled Meds: . aspirin EC  81 mg Oral Daily  . furosemide  20 mg Oral Daily  . levothyroxine  25 mcg Oral QAC breakfast  . mouth rinse  15 mL Mouth Rinse BID  . metoprolol tartrate  25 mg Oral BID  . multivitamin with minerals   Oral Daily  . Rivaroxaban  15 mg Oral Q supper  . sodium chloride flush  3 mL Intravenous Q12H   Continuous Infusions: . sodium chloride    . ceFEPime (MAXIPIME) IV 1 g (03/17/18 0631)  . sodium chloride 500 mL (03/17/18 1534)     LOS: 2 days    Time spent: 25 minutes.     Lelon Frohlich, MD [facility] Pager [phone]  If 7PM-7AM, please contact night-coverage [email or web address] Password [email or web address] 03/17/2018, 4:16 PM

## 2018-03-17 NOTE — Progress Notes (Signed)
Patients BP 75/64, heart rate 52 taken on the monitor. BP taken manually 92/68. Metoprolol held for 2200 dose. Mid Level made aware.

## 2018-03-17 NOTE — Care Management (Signed)
Plan  For DC today. Pt aware HH has 48 hrs to make resumption visit. Vaughan Basta, [facility] rep, aware of DC. Resumption order has been entered.

## 2018-03-17 NOTE — Progress Notes (Signed)
Pt IV removed, WNL. D/C instructions given to pt. Verbalized understanding. Pt spouse is at bedside to transport home.

## 2018-03-17 NOTE — Progress Notes (Signed)
Pt asked NT to stop at the nurse's station as he wasn't feeling good. Stated he was dizzy and had a headache. BP was low at 66/54, checked automatically and manually and verified by another RN. MD made aware, stated to keep patient for another hour lying down and re-check vitals in an hour to determine if patient is still going to be discharged.

## 2018-03-17 NOTE — Progress Notes (Signed)
Pt still is hypotensive and symptomatic with dizziness and fatigue. Verbal order to give 500 cc NS bolus and re-check vitals and make MD aware. Results of bolus will determine if patient stays or will be discharged.

## 2018-03-18 LAB — BASIC METABOLIC PANEL
ANION GAP: 6 (ref 5–15)
BUN: 28 mg/dL — AB (ref 8–23)
CO2: 23 mmol/L (ref 22–32)
Calcium: 8.7 mg/dL — ABNORMAL LOW (ref 8.9–10.3)
Chloride: 109 mmol/L (ref 98–111)
Creatinine, Ser: 1.14 mg/dL (ref 0.61–1.24)
GFR calc Af Amer: >60 mL/min (ref >60)
GFR, EST NON AFRICAN AMERICAN: 58 mL/min — AB (ref >60)
Glucose, Bld: 107 mg/dL — ABNORMAL HIGH (ref 70–99)
POTASSIUM: 3.7 mmol/L (ref 3.5–5.1)
SODIUM: 138 mmol/L (ref 135–145)

## 2018-03-18 MED ORDER — SODIUM CHLORIDE 0.9 % IV BOLUS
500.0000 mL | Freq: Once | INTRAVENOUS | Status: AC
  Administered 2018-03-18: 500 mL via INTRAVENOUS

## 2018-03-18 NOTE — Progress Notes (Signed)
MD contacted about holding PT metoprolol dose of 12.5mg  due to remaining hypotensive and brady. Medication held. 567mL bolus given. Continue to monitor.

## 2018-03-18 NOTE — Progress Notes (Signed)
PROGRESS NOTE    Alejandro [name]  KGU:[id number] DOB: 03/27/1933 DOA: 03/15/2018 PCP: Glenda Chroman, MD     Brief Narrative:  84 year old man admitted from home on 10/16 with complaints of shortness of breath was found to be hypoxemic.  This is believed to be due to a combination of hospital-acquired pneumonia as well as acute on chronic heart failure on top of baseline interstitial lung disease/pulmonary fibrosis.  Admission has been requested for further evaluation and management.   Assessment & Plan:   Principal Problem:   Acute on chronic respiratory failure with hypoxemia ([name]) Active Problems:   IPF (idiopathic pulmonary fibrosis) ([name])   HCAP (healthcare-associated pneumonia)   Essential hypertension   Chronic anticoagulation   HLD (hyperlipidemia)   CKD (chronic kidney disease) stage 3, GFR 30-59 ml/min ([name])   Acute on chronic systolic HF (heart failure) ([name])   Acute on chronic hypoxemic respiratory failure -Due to a combination of hospital-acquired pneumonia and acute on chronic systolic heart failure on top of baseline interstitial lung disease/pulmonary fibrosis. -Continue oxygen as tolerated, see below for details.  Hospital-acquired pneumonia -Culture data remains pending. -MRSA PCR is negative, as such we will discontinue vancomycin.  Continue cefepime alone for now.  Hypotension -Suspect patient was overly diuresed. -Discharge was held yesterday due to systolic blood pressures in the 60s to 80s. -He was hypotensive again this morning but it has improved as the day has progressed. -For now I have taken him off his Lasix and his metoprolol dose has been decreased from 25 to 12.5 mg twice daily.  Acute on chronic systolic heart failure -With a known ejection fraction of 35% per echo in September 2019. -Patient is approximately 2 L negative since admission. -He would benefit from home health RN upon discharge given his second hospitalization in 6 weeks for  heart failure. -Management of his heart failure has been quite difficult due to his hypotension, limiting use of many medications.  I suspect this is why on his prior discharge he was only sent home on Lasix 20 mg as needed.  When I attempted to place him on Lasix 20 mg daily he became significantly hypotensive.  He is only on minimal dose of beta-blocker at this time. -Continue beta-blocker, no ACE inhibitor/ARB/ARN I due to renal dysfunction and hypotension.  Chronic kidney disease stage III -Cr remains at baseline.  Hypothyroidism -TSH within normal limits at 2.052, continue home dose of Synthroid.  Interstitial lung disease/pulmonary fibrosis -Will need to continue home oxygen. -Needs outpatient pulmonary referral.  Permanent atrial fibrillation -Rate controlled, continue Xarelto for anticoagulation.   DVT prophylaxis: Xarelto Code Status: Full code Family Communication: Wife at bedside updated on plan of care and all questions answered on 10/18 Disposition Plan: Hope for discharge home over next 24 to 48 hours with continued clinical improvement and better BP management.  Consultants:   None  Procedures:   None  Antimicrobials:  Anti-infectives (From admission, onward)   Start     Dose/Rate Route Frequency Ordered Stop   03/17/18 0000  amoxicillin-clavulanate (AUGMENTIN) 500-125 MG tablet     1 tablet Oral 2 times daily 03/17/18 1329 03/22/18 2359   03/16/18 1800  ceFEPIme (MAXIPIME) 1 g in sodium chloride 0.9 % 100 mL IVPB     1 g 200 mL/hr over 30 Minutes Intravenous Every 12 hours 03/16/18 0857     03/16/18 1700  vancomycin (VANCOCIN) 1,250 mg in sodium chloride 0.9 % 250 mL IVPB  Status:  Discontinued  1,250 mg 166.7 mL/hr over 90 Minutes Intravenous Every 24 hours 03/15/18 1559 03/15/18 1853   03/15/18 2200  ceFEPIme (MAXIPIME) 1 g in sodium chloride 0.9 % 100 mL IVPB  Status:  Discontinued     1 g 200 mL/hr over 30 Minutes Intravenous Every 8 hours 03/15/18  1853 03/16/18 0857   03/15/18 1500  vancomycin (VANCOCIN) 1,500 mg in sodium chloride 0.9 % 500 mL IVPB     1,500 mg 250 mL/hr over 120 Minutes Intravenous  Once 03/15/18 1440 03/15/18 1930   03/15/18 1445  ceFEPIme (MAXIPIME) 1 g in sodium chloride 0.9 % 100 mL IVPB  Status:  Discontinued     1 g 200 mL/hr over 30 Minutes Intravenous Every 8 hours 03/15/18 1434 03/15/18 1853       Subjective: Sitting in chair at bedside, continues to complain of dizziness when standing up.  Objective: Vitals:   03/18/18 0500 03/18/18 0634 03/18/18 0940 03/18/18 1544  BP:  96/65  103/64  Pulse:  (!) 54  62  Resp:  15  16  Temp:  97.8 F (36.6 C)  (!) 97.1 F (36.2 C)  TempSrc:  Oral  Oral  SpO2:  100% 94% 98%  Weight: 77.2 kg     Height:        Intake/Output Summary (Last 24 hours) at 03/18/2018 1627 Last data filed at 03/18/2018 1047 Gross per 24 hour  Intake 741.72 ml  Output 900 ml  Net -158.28 ml   Filed Weights   03/16/18 0500 03/17/18 0700 03/18/18 0500  Weight: 76.2 kg 75.6 kg 77.2 kg    Examination:  General exam: Alert, awake, oriented x 2 Respiratory system: Clear to auscultation. Respiratory effort normal. Cardiovascular system:RRR. No murmurs, rubs, gallops. Gastrointestinal system: Abdomen is nondistended, soft and nontender. No organomegaly or masses felt. Normal bowel sounds heard. Central nervous system: Alert and oriented. No focal neurological deficits. Extremities: No C/C/E, +pedal pulses Skin: No rashes, lesions or ulcers Psychiatry: Judgement and insight appear normal. Mood & affect appropriate.       Data Reviewed: I have personally reviewed following labs and imaging studies  CBC: Recent Labs  Lab 03/15/18 1328 03/16/18 0610 03/17/18 0535  WBC 14.7* 12.1* 10.7*  NEUTROABS 10.0*  --   --   HGB 13.3 12.8* 13.4  HCT 42.5 40.5 42.0  MCV 98.4 98.8 97.4  PLT 299 267 161   Basic Metabolic Panel: Recent Labs  Lab 03/15/18 1328 03/16/18 0610  03/17/18 0535 03/18/18 0740  NA 135 138 137 138  K 3.8 3.5 3.6 3.7  CL 104 104 107 109  CO2 23 25 23 23   GLUCOSE 106* 111* 117* 107*  BUN 17 18 26* 28*  CREATININE 1.22 1.39* 1.31* 1.14  CALCIUM 8.8* 8.7* 9.0 8.7*   GFR: Estimated Creatinine Clearance: 47.5 mL/min (by C-G formula based on SCr of 1.14 mg/dL). Liver Function Tests: No results for input(s): AST, ALT, ALKPHOS, BILITOT, PROT, ALBUMIN in the last 168 hours. No results for input(s): LIPASE, AMYLASE in the last 168 hours. No results for input(s): AMMONIA in the last 168 hours. Coagulation Profile: No results for input(s): INR, PROTIME in the last 168 hours. Cardiac Enzymes: Recent Labs  Lab 03/15/18 1328  TROPONINI <0.03   BNP (last 3 results) No results for input(s): PROBNP in the last 8760 hours. HbA1C: No results for input(s): HGBA1C in the last 72 hours. CBG: Recent Labs  Lab 03/15/18 2231  GLUCAP 116*   Lipid Profile: No results  for input(s): CHOL, HDL, LDLCALC, TRIG, CHOLHDL, LDLDIRECT in the last 72 hours. Thyroid Function Tests: No results for input(s): TSH, T4TOTAL, FREET4, T3FREE, THYROIDAB in the last 72 hours. Anemia Panel: No results for input(s): VITAMINB12, FOLATE, FERRITIN, TIBC, IRON, RETICCTPCT in the last 72 hours. Urine analysis:    Component Value Date/Time   COLORURINE YELLOW 12/26/2014 [place] 12/26/2014 0352   LABSPEC 1.025 12/26/2014 0352   PHURINE 6.0 12/26/2014 0352   GLUCOSEU NEGATIVE 12/26/2014 0352   HGBUR NEGATIVE 12/26/2014 0352   BILIRUBINUR NEGATIVE 12/26/2014 0352   KETONESUR NEGATIVE 12/26/2014 0352   PROTEINUR NEGATIVE 12/26/2014 0352   UROBILINOGEN 0.2 12/26/2014 0352   NITRITE NEGATIVE 12/26/2014 0352   LEUKOCYTESUR SMALL (A) 12/26/2014 0352   Sepsis Labs: @LABRCNTIP (procalcitonin:4,lacticidven:4)  ) Recent Results (from the past 240 hour(s))  Culture, blood (routine x 2) Call MD if unable to obtain prior to antibiotics being given      Status: None (Preliminary result)   Collection Time: 03/15/18  3:13 PM  Result Value Ref Range Status   Specimen Description BLOOD LEFT ANTECUBITAL  Final   Special Requests   Final    BOTTLES DRAWN AEROBIC AND ANAEROBIC Blood Culture results may not be optimal due to an excessive volume of blood received in culture bottles   Culture   Final    NO GROWTH 3 DAYS Performed at [facility], [address] [place], [place] [address]    Report Status PENDING  Incomplete  Culture, blood (routine x 2) Call MD if unable to obtain prior to antibiotics being given     Status: None (Preliminary result)   Collection Time: 03/15/18  3:13 PM  Result Value Ref Range Status   Specimen Description BLOOD LEFT WRIST  Final   Special Requests   Final    BOTTLES DRAWN AEROBIC AND ANAEROBIC Blood Culture adequate volume   Culture   Final    NO GROWTH 3 DAYS Performed at [facility], [address], [place], [place] [address]    Report Status PENDING  Incomplete  MRSA PCR Screening     Status: None   Collection Time: 03/15/18 10:42 PM  Result Value Ref Range Status   MRSA by PCR NEGATIVE NEGATIVE Final    Comment:        The GeneXpert MRSA Assay (FDA approved for NASAL specimens only), is one component of a comprehensive MRSA colonization surveillance program. It is not intended to diagnose MRSA infection nor to guide or monitor treatment for MRSA infections. Performed at [facility], [address], [place], [place] [address]          Radiology Studies: No results found.      Scheduled Meds: . aspirin EC  81 mg Oral Daily  . levothyroxine  25 mcg Oral QAC breakfast  . mouth rinse  15 mL Mouth Rinse BID  . metoprolol tartrate  12.5 mg Oral BID  . multivitamin with minerals   Oral Daily  . Rivaroxaban  15 mg Oral Q supper  . sodium chloride flush  3 mL Intravenous Q12H   Continuous Infusions: . sodium chloride    . ceFEPime (MAXIPIME) IV 1 g (03/18/18 0513)     LOS: 3 days     Time spent: 35 minutes. Greater than 50% of this time was spent in direct contact with the patient, coordinating care and discussing relevant ongoing clinical issues, including difficult management of heart failure given his limiting hypotension.     Cheyna Retana  Isaac Bliss, MD [facility] Pager [phone]  If 7PM-7AM, please contact night-coverage [email or web address] Password [email or web address] 03/18/2018, 4:27 PM

## 2018-03-19 LAB — BASIC METABOLIC PANEL
ANION GAP: 3 — AB (ref 5–15)
BUN: 28 mg/dL — AB (ref 8–23)
CHLORIDE: 108 mmol/L (ref 98–111)
CO2: 25 mmol/L (ref 22–32)
Calcium: 8.5 mg/dL — ABNORMAL LOW (ref 8.9–10.3)
Creatinine, Ser: 1.17 mg/dL (ref 0.61–1.24)
GFR calc Af Amer: >60 mL/min (ref >60)
GFR, EST NON AFRICAN AMERICAN: 56 mL/min — AB (ref >60)
GLUCOSE: 96 mg/dL (ref 70–99)
POTASSIUM: 3.7 mmol/L (ref 3.5–5.1)
Sodium: 136 mmol/L (ref 135–145)

## 2018-03-19 NOTE — Progress Notes (Addendum)
PROGRESS NOTE    Alejandro [name]  UMP:[id number] DOB: 01/09/1933 DOA: 03/15/2018 PCP: Glenda Chroman, MD     Brief Narrative:  84 year old man admitted from home on 10/16 with complaints of shortness of breath was found to be hypoxemic.  This is believed to be due to a combination of hospital-acquired pneumonia as well as acute on chronic heart failure on top of baseline interstitial lung disease/pulmonary fibrosis.  Admission has been requested for further evaluation and management.   Assessment & Plan:   Principal Problem:   Acute on chronic respiratory failure with hypoxemia ([name]) Active Problems:   IPF (idiopathic pulmonary fibrosis) ([name])   HCAP (healthcare-associated pneumonia)   Essential hypertension   Chronic anticoagulation   HLD (hyperlipidemia)   CKD (chronic kidney disease) stage 3, GFR 30-59 ml/min ([name])   Acute on chronic systolic HF (heart failure) ([name])   Acute on chronic hypoxemic respiratory failure -Due to a combination of hospital-acquired pneumonia and acute on chronic systolic heart failure on top of baseline interstitial lung disease/pulmonary fibrosis. -Continue oxygen as tolerated, see below for details.  Hospital-acquired pneumonia -Culture data remains pending. -MRSA PCR is negative, as such we will discontinue vancomycin.  Continue cefepime alone for now.  Hypotension -Suspect patient was overly diuresed.  At maximum diuresis he was only 2 L negative. -Discharge was held on 40/08 due to systolic blood pressures in the 60s to 80s. -Given continued hypotension, Lasix and metoprolol have been completely discontinued. -His heart rates with his A. fib are starting to increase.  Will ask cardiology to consult tomorrow when available for further recommendations.  Acute on chronic systolic heart failure -With a known ejection fraction of 35% per echo in September 2019. -Patient is approximately 1.3 L negative since admission. -He would benefit from home  health RN upon discharge given his second hospitalization in 6 weeks for heart failure. -Management of his heart failure has been quite difficult due to his hypotension, limiting use of many medications.  I suspect this is why on his prior discharge he was only sent home on Lasix 20 mg as needed.  When I attempted to place him on Lasix 20 mg daily he became significantly hypotensive.  Continued hypotension on 10/20 has required complete cessation of beta-blocker as well. -Beta-blocker discontinued, no ACE inhibitor/ARB/ARN I due to renal dysfunction and hypotension. -Cardiology consultation will be requested for 10/21 due to management of rapid A. fib and heart failure in the face of significant hypotension.  Chronic kidney disease stage III -Cr remains at baseline.  Hypothyroidism -TSH within normal limits at 2.052, continue home dose of Synthroid.  Interstitial lung disease/pulmonary fibrosis -Will need to continue home oxygen. -Needs outpatient pulmonary referral.  Permanent atrial fibrillation -Continue Xarelto for anticoagulation. -Rates are starting to increase to the 1 teens, metoprolol has been discontinued due to significant hypotension. -Cardiology consultation will be requested for further recommendations.   DVT prophylaxis: Xarelto Code Status: Full code Family Communication: Wife at bedside updated on plan of care and all questions answered on 10/18 Disposition Plan: Pending improvement in blood pressure and cardiology recommendations.  Consultants:   None  Procedures:   None  Antimicrobials:  Anti-infectives (From admission, onward)   Start     Dose/Rate Route Frequency Ordered Stop   03/17/18 0000  amoxicillin-clavulanate (AUGMENTIN) 500-125 MG tablet     1 tablet Oral 2 times daily 03/17/18 1329 03/22/18 2359   03/16/18 1800  ceFEPIme (MAXIPIME) 1 g in sodium chloride 0.9 % 100  mL IVPB     1 g 200 mL/hr over 30 Minutes Intravenous Every 12 hours 03/16/18 0857      03/16/18 1700  vancomycin (VANCOCIN) 1,250 mg in sodium chloride 0.9 % 250 mL IVPB  Status:  Discontinued     1,250 mg 166.7 mL/hr over 90 Minutes Intravenous Every 24 hours 03/15/18 1559 03/15/18 1853   03/15/18 2200  ceFEPIme (MAXIPIME) 1 g in sodium chloride 0.9 % 100 mL IVPB  Status:  Discontinued     1 g 200 mL/hr over 30 Minutes Intravenous Every 8 hours 03/15/18 1853 03/16/18 0857   03/15/18 1500  vancomycin (VANCOCIN) 1,500 mg in sodium chloride 0.9 % 500 mL IVPB     1,500 mg 250 mL/hr over 120 Minutes Intravenous  Once 03/15/18 1440 03/15/18 1930   03/15/18 1445  ceFEPIme (MAXIPIME) 1 g in sodium chloride 0.9 % 100 mL IVPB  Status:  Discontinued     1 g 200 mL/hr over 30 Minutes Intravenous Every 8 hours 03/15/18 1434 03/15/18 1853       Subjective: Lying in bed, feels well, no complaints other than dizziness when he sits up and walks.  Denies chest pain or shortness of breath.  Objective: Vitals:   03/18/18 2243 03/19/18 0500 03/19/18 0632 03/19/18 1052  BP: (!) 82/54  (!) 81/56   Pulse: (!) 52  (!) 52   Resp:      Temp: 98.2 F (36.8 C)  98.1 F (36.7 C)   TempSrc: Oral  Oral   SpO2: 96%  94% 95%  Weight:  78.1 kg    Height:        Intake/Output Summary (Last 24 hours) at 03/19/2018 1510 Last data filed at 03/19/2018 1029 Gross per 24 hour  Intake 1659.98 ml  Output 525 ml  Net 1134.98 ml   Filed Weights   03/17/18 0700 03/18/18 0500 03/19/18 0500  Weight: 75.6 kg 77.2 kg 78.1 kg    Examination:  General exam: Alert, awake, oriented x 3 Respiratory system: Clear to auscultation. Respiratory effort normal. Cardiovascular system:RRR. No murmurs, rubs, gallops. Gastrointestinal system: Abdomen is nondistended, soft and nontender. No organomegaly or masses felt. Normal bowel sounds heard. Central nervous system: Alert and oriented. No focal neurological deficits. Extremities: No C/C/E, +pedal pulses Skin: No rashes, lesions or ulcers Psychiatry:  Judgement and insight appear normal. Mood & affect appropriate.        Data Reviewed: I have personally reviewed following labs and imaging studies  CBC: Recent Labs  Lab 03/15/18 1328 03/16/18 0610 03/17/18 0535  WBC 14.7* 12.1* 10.7*  NEUTROABS 10.0*  --   --   HGB 13.3 12.8* 13.4  HCT 42.5 40.5 42.0  MCV 98.4 98.8 97.4  PLT 299 267 458   Basic Metabolic Panel: Recent Labs  Lab 03/15/18 1328 03/16/18 0610 03/17/18 0535 03/18/18 0740 03/19/18 0655  NA 135 138 137 138 136  K 3.8 3.5 3.6 3.7 3.7  CL 104 104 107 109 108  CO2 23 25 23 23 25   GLUCOSE 106* 111* 117* 107* 96  BUN 17 18 26* 28* 28*  CREATININE 1.22 1.39* 1.31* 1.14 1.17  CALCIUM 8.8* 8.7* 9.0 8.7* 8.5*   GFR: Estimated Creatinine Clearance: 46.3 mL/min (by C-G formula based on SCr of 1.17 mg/dL). Liver Function Tests: No results for input(s): AST, ALT, ALKPHOS, BILITOT, PROT, ALBUMIN in the last 168 hours. No results for input(s): LIPASE, AMYLASE in the last 168 hours. No results for input(s): AMMONIA in the  last 168 hours. Coagulation Profile: No results for input(s): INR, PROTIME in the last 168 hours. Cardiac Enzymes: Recent Labs  Lab 03/15/18 1328  TROPONINI <0.03   BNP (last 3 results) No results for input(s): PROBNP in the last 8760 hours. HbA1C: No results for input(s): HGBA1C in the last 72 hours. CBG: Recent Labs  Lab 03/15/18 2231  GLUCAP 116*   Lipid Profile: No results for input(s): CHOL, HDL, LDLCALC, TRIG, CHOLHDL, LDLDIRECT in the last 72 hours. Thyroid Function Tests: No results for input(s): TSH, T4TOTAL, FREET4, T3FREE, THYROIDAB in the last 72 hours. Anemia Panel: No results for input(s): VITAMINB12, FOLATE, FERRITIN, TIBC, IRON, RETICCTPCT in the last 72 hours. Urine analysis:    Component Value Date/Time   COLORURINE YELLOW 12/26/2014 [place] 12/26/2014 0352   LABSPEC 1.025 12/26/2014 0352   PHURINE 6.0 12/26/2014 0352   GLUCOSEU NEGATIVE  12/26/2014 0352   HGBUR NEGATIVE 12/26/2014 0352   BILIRUBINUR NEGATIVE 12/26/2014 0352   KETONESUR NEGATIVE 12/26/2014 0352   PROTEINUR NEGATIVE 12/26/2014 0352   UROBILINOGEN 0.2 12/26/2014 0352   NITRITE NEGATIVE 12/26/2014 0352   LEUKOCYTESUR SMALL (A) 12/26/2014 0352   Sepsis Labs: @LABRCNTIP (procalcitonin:4,lacticidven:4)  ) Recent Results (from the past 240 hour(s))  Culture, blood (routine x 2) Call MD if unable to obtain prior to antibiotics being given     Status: None (Preliminary result)   Collection Time: 03/15/18  3:13 PM  Result Value Ref Range Status   Specimen Description BLOOD LEFT ANTECUBITAL  Final   Special Requests   Final    BOTTLES DRAWN AEROBIC AND ANAEROBIC Blood Culture results may not be optimal due to an excessive volume of blood received in culture bottles   Culture   Final    NO GROWTH 4 DAYS Performed at [facility], [address] [place], [place] [address]    Report Status PENDING  Incomplete  Culture, blood (routine x 2) Call MD if unable to obtain prior to antibiotics being given     Status: None (Preliminary result)   Collection Time: 03/15/18  3:13 PM  Result Value Ref Range Status   Specimen Description BLOOD LEFT WRIST  Final   Special Requests   Final    BOTTLES DRAWN AEROBIC AND ANAEROBIC Blood Culture adequate volume   Culture   Final    NO GROWTH 4 DAYS Performed at [facility], [address] [place], [place] [address]    Report Status PENDING  Incomplete  MRSA PCR Screening     Status: None   Collection Time: 03/15/18 10:42 PM  Result Value Ref Range Status   MRSA by PCR NEGATIVE NEGATIVE Final    Comment:        The GeneXpert MRSA Assay (FDA approved for NASAL specimens only), is one component of a comprehensive MRSA colonization surveillance program. It is not intended to diagnose MRSA infection nor to guide or monitor treatment for MRSA infections. Performed at [facility], [address], [place], [place]  [address]          Radiology Studies: No results found.      Scheduled Meds: . aspirin EC  81 mg Oral Daily  . levothyroxine  25 mcg Oral QAC breakfast  . mouth rinse  15 mL Mouth Rinse BID  . multivitamin with minerals   Oral Daily  . Rivaroxaban  15 mg Oral Q supper  . sodium chloride flush  3 mL Intravenous Q12H   Continuous Infusions: . sodium chloride 250  mL (03/19/18 1026)  . ceFEPime (MAXIPIME) IV Stopped (03/19/18 0605)     LOS: 4 days    Time spent: 25 minutes.    Lelon Frohlich, MD [facility] Pager [phone]  If 7PM-7AM, please contact night-coverage [email or web address] Password [email or web address] 03/19/2018, 3:10 PM

## 2018-03-19 NOTE — Progress Notes (Signed)
MD on for floor coverage paged about PT telemetry strip, 17 sec wide QRS with PVCs. Continue to monitor. No new orders at this time.

## 2018-03-20 DIAGNOSIS — J84112 Idiopathic pulmonary fibrosis: Secondary | ICD-10-CM

## 2018-03-20 DIAGNOSIS — I4891 Unspecified atrial fibrillation: Secondary | ICD-10-CM

## 2018-03-20 DIAGNOSIS — I959 Hypotension, unspecified: Secondary | ICD-10-CM

## 2018-03-20 DIAGNOSIS — I10 Essential (primary) hypertension: Secondary | ICD-10-CM

## 2018-03-20 DIAGNOSIS — E785 Hyperlipidemia, unspecified: Secondary | ICD-10-CM

## 2018-03-20 LAB — CULTURE, BLOOD (ROUTINE X 2)
CULTURE: NO GROWTH
Culture: NO GROWTH
Special Requests: ADEQUATE

## 2018-03-20 LAB — CBC
HCT: 39.4 % (ref 39.0–52.0)
HEMOGLOBIN: 12.4 g/dL — AB (ref 13.0–17.0)
MCH: 31.2 pg (ref 26.0–34.0)
MCHC: 31.5 g/dL (ref 30.0–36.0)
MCV: 99.2 fL (ref 80.0–100.0)
NRBC: 0 % (ref 0.0–0.2)
PLATELETS: 253 10*3/uL (ref 150–400)
RBC: 3.97 MIL/uL — AB (ref 4.22–5.81)
RDW: 13.6 % (ref 11.5–15.5)
WBC: 8 10*3/uL (ref 4.0–10.5)

## 2018-03-20 LAB — BASIC METABOLIC PANEL
ANION GAP: 5 (ref 5–15)
BUN: 20 mg/dL (ref 8–23)
CALCIUM: 8.6 mg/dL — AB (ref 8.9–10.3)
CO2: 24 mmol/L (ref 22–32)
Chloride: 109 mmol/L (ref 98–111)
Creatinine, Ser: 1 mg/dL (ref 0.61–1.24)
GLUCOSE: 99 mg/dL (ref 70–99)
POTASSIUM: 3.7 mmol/L (ref 3.5–5.1)
Sodium: 138 mmol/L (ref 135–145)

## 2018-03-20 MED ORDER — METOPROLOL TARTRATE 25 MG PO TABS
12.5000 mg | ORAL_TABLET | Freq: Two times a day (BID) | ORAL | Status: DC
  Administered 2018-03-20 – 2018-03-21 (×2): 12.5 mg via ORAL
  Filled 2018-03-20 (×2): qty 1

## 2018-03-20 MED ORDER — METOPROLOL TARTRATE 25 MG PO TABS: 25.0000 mg | ORAL_TABLET | Freq: Two times a day (BID) | ORAL | Status: DC

## 2018-03-20 MED ORDER — MIDODRINE HCL 5 MG PO TABS
5.0000 mg | ORAL_TABLET | Freq: Two times a day (BID) | ORAL | Status: DC
  Administered 2018-03-20 – 2018-03-21 (×3): 5 mg via ORAL
  Filled 2018-03-20 (×3): qty 1

## 2018-03-20 NOTE — Progress Notes (Signed)
PT awoke agitated and a bit confused. PT c/o a women throwing a blanket through the door at him. I attempted to reassure PT and reorient him to realize he may have been dreaming. PT continues to be agitated and request to speak with "nice dressed lady that owns this place."

## 2018-03-20 NOTE — Consult Note (Addendum)
Cardiology Consultation:   Patient ID: Alejandro [name] MRN: [id number]; DOB: 11-14-1932  Admit date: 03/15/2018 Date of Consult: 03/20/2018  Primary Care Provider: Glenda Chroman, MD Primary Cardiologist: Rozann Lesches, MD  Primary Electrophysiologist:  Thompson Grayer, MD    Patient Profile:   Alejandro [name] is a 84 y.o. male with a hx of ischemic cardiomyopathy ejection fraction 35% who is being seen today for the evaluation of acute on chronic CHF at the request of Dr. Jerilee Hoh.  History of Present Illness:   Alejandro [name] is an 84 year old male patient with history of nonischemic cardiomyopathy LVEF 35% 01/2018.  He recently developed amiodarone toxicity and it was stopped.  Plan was for rate control of permanent atrial fibrillation.  He is on Xarelto for stroke prophylaxis.  Patient was admitted with acute respiratory failure secondary to pneumonia and CHF.  He is also had rapid atrial fibrillation complicated by chronic hypotension.  Also has a history of syncope and ILR was explanted when the battery reached end of life.  No episodes.  Has been intolerant to ARB or ACE inhibitor because of hypotension.  Patient currently alert but confused(alzheimer's). He's not sure why he's here or when he came. Says he has no energy.Denies cardiac complaints. Lying flat comfortably.   Past Medical History:  Diagnosis Date  . Alzheimer's dementia ([place])   . CAD (coronary artery disease)    a. 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 Med rx recommended, EF 30-35% at that cath.  . CHF (congestive heart failure) ([place])   . Chronic shortness of breath   . CKD (chronic kidney disease), stage III ([place])   . Essential hypertension   . ILD (interstitial lung disease) ([place]) 01/2018   via CT scan   .  Memory loss   . Non-ischemic cardiomyopathy ([place])    EF 35%  . NSVT (nonsustained ventricular tachycardia) ([name])    Negative EP study  . On home O2    prn during day, continuous qhs  . Orthostatic hypotension   . Paroxysmal atrial fibrillation ([name])   . Secondary cardiomyopathy ([place])    LVEF 40-45% January 2014  . Syncope   . TIA (transient ischemic attack)   . Uses walker     Past Surgical History:  Procedure Laterality Date  . CARDIOVERSION N/A 10/19/2016   Procedure: CARDIOVERSION;  Surgeon: [name], MD;  Location: [facility];  Service: Cardiovascular;  Laterality: N/A;  . CATARACT EXTRACTION    . COLONOSCOPY  unknown  . COLONOSCOPY N/A 09/05/2014   Procedure: COLONOSCOPY;  Surgeon: [name], MD;  Location: [facility];  Service: Endoscopy;  Laterality: N/A;  145pm  . ELECTROPHYSIOLOGY STUDY N/A 08/05/2014   Procedure: ELECTROPHYSIOLOGY STUDY;  Surgeon: Deboraha Sprang, MD;  Location: [facility] CATH LAB;  Service: Cardiovascular;  Laterality: N/A;  . LAPAROSCOPIC APPENDECTOMY  November 2015   Dr. [name]  . LEFT HEART CATHETERIZATION WITH CORONARY ANGIOGRAM N/A 07/31/2014   Procedure: LEFT HEART CATHETERIZATION WITH CORONARY ANGIOGRAM;  Surgeon: [name], MD;  Location: [facility] CATH LAB;  Service: Cardiovascular;  Laterality: N/A;  . LOOP RECORDER IMPLANT    . LOOP RECORDER REMOVAL  11/09/2017   MDT LINQ loop recorder removed for end of battery life of the device by Dr Rayann Heman     Home Medications:  Prior to Admission  medications   Medication Sig Start Date End Date Taking? Authorizing Provider  furosemide (LASIX) 20 MG tablet Take 1 tablet (20 mg total) by mouth as needed (WEIGHT GAIN OF 3 LBS OR MORE IN 24 HOURS). 02/05/18 05/06/18 Yes Barton Dubois, MD  levothyroxine (SYNTHROID, LEVOTHROID) 25 MCG tablet Take 25 mcg by mouth daily before breakfast.   Yes [provider]  metoprolol tartrate (LOPRESSOR) 25 MG tablet Take 1 tablet (25 mg total) by mouth 2  (two) times daily. 02/05/18  Yes Barton Dubois, MD  midodrine (PROAMATINE) 5 MG tablet Take 5 mg by mouth 2 (two) times daily with a meal.    Yes [provider]  Multiple Vitamins-Minerals (MULTIVITAMIN MEN 50+ PO) Take 1 tablet by mouth daily.   Yes [provider]  XARELTO 15 MG TABS tablet TAKE 1 TABLET BY MOUTH DAILY WITH SUPPER. 03/28/17  Yes Satira Sark, MD  acetaminophen (TYLENOL) 500 MG tablet Take 1,000-1,500 mg by mouth every 6 (six) hours as needed for mild pain or moderate pain.    [provider]  amoxicillin-clavulanate (AUGMENTIN) 500-125 MG tablet Take 1 tablet (500 mg total) by mouth 2 (two) times daily for 5 days. 03/17/18 03/22/18  Erline Hau, MD  aspirin EC 81 MG EC tablet Take 1 tablet (81 mg total) by mouth daily. 03/18/18   Isaac Bliss, Rayford Halsted, MD  furosemide (LASIX) 20 MG tablet Take 1 tablet (20 mg total) by mouth daily. 03/18/18   Erline Hau, MD    Inpatient Medications: Scheduled Meds: . aspirin EC  81 mg Oral Daily  . levothyroxine  25 mcg Oral QAC breakfast  . mouth rinse  15 mL Mouth Rinse BID  . multivitamin with minerals   Oral Daily  . Rivaroxaban  15 mg Oral Q supper  . sodium chloride flush  3 mL Intravenous Q12H   Continuous Infusions: . sodium chloride 250 mL (03/19/18 1026)  . ceFEPime (MAXIPIME) IV 1 g (03/20/18 0710)   PRN Meds: sodium chloride, acetaminophen **OR** acetaminophen, levalbuterol, ondansetron **OR** ondansetron (ZOFRAN) IV, senna-docusate, sodium chloride flush  Allergies:   No Known Allergies  Social History:   Social History   Socioeconomic History  . Marital status: Married    Spouse name: Not on file  . Number of children: Not on file  . Years of education: 43  . Highest education level: Not on file  Occupational History  . Occupation: Retired  Scientific laboratory technician  . Financial resource strain: Not on file  . Food insecurity:    Worry: Not on file     Inability: Not on file  . Transportation needs:    Medical: Not on file    Non-medical: Not on file  Tobacco Use  . Smoking status: Never Smoker  . Smokeless tobacco: Never Used  Substance and Sexual Activity  . Alcohol use: No    Alcohol/week: 0.0 standard drinks  . Drug use: No  . Sexual activity: Not on file  Lifestyle  . Physical activity:    Days per week: Not on file    Minutes per session: Not on file  . Stress: Not on file  Relationships  . Social connections:    Talks on phone: Not on file    Gets together: Not on file    Attends religious service: Not on file    Active member of club or organization: Not on file    Attends meetings of clubs or organizations: Not on file  Relationship status: Not on file  . Intimate partner violence:    Fear of current or ex partner: Not on file    Emotionally abused: Not on file    Physically abused: Not on file    Forced sexual activity: Not on file  Other Topics Concern  . Not on file  Social History Narrative   Caffeine: 2 drinks a day     Family History:    Family History  Problem Relation Age of Onset  . Heart disease Father        Diagnosed in his 19s  . Arthritis Father   . Alzheimer's disease Mother      ROS:  Please see the history of present illness.  Review of Systems  Reason unable to perform ROS: alzheimer's.    All other ROS reviewed and negative.     Physical Exam/Data:   Vitals:   03/19/18 1556 03/19/18 2110 03/20/18 0500 03/20/18 0555  BP: 98/73 113/83  118/86  Pulse: (!) 104 (!) 53  (!) 55  Resp:  16  18  Temp: 98.5 F (36.9 C) 98.2 F (36.8 C)  98 F (36.7 C)  TempSrc: Oral Oral  Oral  SpO2: 98% 96%  96%  Weight:   78.4 kg   Height:        Intake/Output Summary (Last 24 hours) at 03/20/2018 0801 Last data filed at 03/20/2018 0600 Gross per 24 hour  Intake 1682.67 ml  Output 550 ml  Net 1132.67 ml   Filed Weights   03/18/18 0500 03/19/18 0500 03/20/18 0500  Weight: 77.2 kg  78.1 kg 78.4 kg   Body mass index is 26.28 kg/m.  General:  Well nourished, well developed, in no acute distress HEENT: normal Lymph: no adenopathy Neck: no JVD Endocrine:  No thryomegaly Vascular: No carotid bruits; FA pulses 2+ bilaterally without bruits  Cardiac:  normal S1, S2; irreg irreg 1/6 sys murmur apex Lungs: decreased breath sounds with some rhonchi  Abd: soft, nontender, no hepatomegaly  Ext: no edema Musculoskeletal:  No deformities, BUE and BLE strength normal and equal Skin: warm and dry  Neuro:  CNs 2-12 intact, no focal abnormalities noted Psych:  Normal affect   EKG:  The EKG was personally reviewed and demonstrates: Atrial fibrillation at 131 bpm with ST-T wave changes inferior lateral Telemetry:  Telemetry was personally reviewed and demonstrates:  Afib at 120/m but as high as 142/m with PVC's and 3 beat NSVT  Relevant CV Studies:  Chest CT 02/03/2018: IMPRESSION: 1. The appearance of the lungs is compatible with interstitial lung disease, with a probable usual interstitial pneumonia (UIP) CT pattern. Repeat high-resolution chest CT is suggested in 12 months to assess for temporal changes in the appearance of the lung parenchyma. 2. Aortic atherosclerosis, in addition to left main and 3 vessel coronary artery disease. 3. There are calcifications of the aortic valve. Echocardiographic correlation for evaluation of potential valvular dysfunction may be warranted if clinically indicated.   Echocardiogram 01/31/2018: Study Conclusions   - Left ventricle: The cavity size was normal. Wall thickness was   increased in a pattern of mild LVH. The estimated ejection   fraction was 35%. Diffuse hypokinesis. There is hypokinesis of   the basalinferolateral myocardium. The study was not technically   sufficient to allow evaluation of LV diastolic dysfunction due to   atrial fibrillation. - Aortic valve: Mildly calcified annulus. Trileaflet; mildly   calcified  leaflets. There was trivial regurgitation. - Mitral valve: There was mild  regurgitation. - Left atrium: The atrium was mildly dilated. - Right atrium: Central venous pressure (est): 3 mm Hg. - Atrial septum: No defect or patent foramen ovale was identified. - Tricuspid valve: There was trivial regurgitation. - Pulmonary arteries: Systolic pressure was moderately increased.   PA peak pressure: 50 mm Hg (S). - Pericardium, extracardiac: There was no pericardial effusion.    Laboratory Data:  Chemistry Recent Labs  Lab 03/18/18 0740 03/19/18 0655 03/20/18 0634  NA 138 136 138  K 3.7 3.7 3.7  CL 109 108 109  CO2 23 25 24   GLUCOSE 107* 96 99  BUN 28* 28* 20  CREATININE 1.14 1.17 1.00  CALCIUM 8.7* 8.5* 8.6*  GFRNONAA 58* 56* >60  GFRAA >60 >60 >60  ANIONGAP 6 3* 5    No results for input(s): PROT, ALBUMIN, AST, ALT, ALKPHOS, BILITOT in the last 168 hours. Hematology Recent Labs  Lab 03/16/18 0610 03/17/18 0535 03/20/18 0634  WBC 12.1* 10.7* 8.0  RBC 4.10* 4.31 3.97*  HGB 12.8* 13.4 12.4*  HCT 40.5 42.0 39.4  MCV 98.8 97.4 99.2  MCH 31.2 31.1 31.2  MCHC 31.6 31.9 31.5  RDW 14.0 13.8 13.6  PLT 267 267 253   Cardiac Enzymes Recent Labs  Lab 03/15/18 1328  TROPONINI <0.03   No results for input(s): TROPIPOC in the last 168 hours.  BNP Recent Labs  Lab 03/15/18 1328  BNP 1,477.0*    DDimer No results for input(s): DDIMER in the last 168 hours.  Radiology/Studies:  No results found.  Assessment and Plan:   1. Acute respiratory failure secondary to pneumonia and CHF 2. Acute on chronic combined systolic and diastolic CHF ejection fraction 35% on echo 01/2018.  Felt secondary to rapid atrial fibrillation.  May have been over diuresed now +1 L overnight.  Only takes Lasix as needed at home.  Admission weight 83.5 kg dropped down to 75.6 and now 78.1 today.CHF well compensated. 3. Permanent atrial fibrillation previously on amiodarone but stopped secondary to lung  toxicity.  Plan for rate control but has been difficult because of chronic hypotension. BP up to 118/86 today. Will restart metoprolol 12.5 mg BID in addition to midodrine 5 mg bid as taken pta.  Was on metoprolol 25 mg BID pta so hopefully can get back on that dose.Hold off on diuretics as he is euvolemic. On xarelto. 4. Hypotension on Midodrine as outpatient but not on now-Lasix and metoprolol discontinued.  Felt he may have been over diuresed.restart midodrine. 5. History of syncope with NSVT with negative EP study, ILR explanted after battery end-of-life 6. History of TIA 7. CAD on cath showing aneurysmal right coronary artery with sluggish flow and possible chronic layered thrombus distally, borderline significant ostial left circumflex otherwise no obstructive CAD medical therapy recommended 8. CKD stage III creatinine stable at 1.00      For questions or updates, please contact [facility] Please consult [email or web address] for contact info under     Signed, Ermalinda Barrios, PA-C  03/20/2018 8:01 AM   The patient was seen and examined, and I agree with the history, physical exam, assessment and plan as documented above, with modifications as noted below. I have also personally reviewed all relevant documentation, old records, labs, and both radiographic and cardiovascular studies. I have also independently interpreted old and new ECG's.  Briefly, this is an 84 year old male who was recently seen by Dr. Domenic Polite in the office with a cardiac history significant for nonischemic cardiomyopathy with chronic systolic heart  failure and what is now classified as permanent atrial fibrillation.  He had been on amiodarone but this was discontinued due to concerns for pulmonary toxicity.  A rate control strategy with anticoagulation has been adopted.  He was taking Lopressor 25 mg twice daily and ProAmatine 5 mg two times daily at the time of his office visit on 02/27/2018.  Of note, his blood pressure  was 90/64 at that office visit.  During the time of my evaluation, he said he is unable to get out of the bed but denies chest pain and shortness of breath as well as leg swelling, orthopnea, and paroxysmal nocturnal dyspnea.  He has had rapid atrial fibrillation in the context of acute on chronic hypoxemic respiratory failure with both pneumonia and decompensated heart failure implicated.  I reviewed the chest CT dated 02/03/2018 which demonstrated interstitial lung disease.  I reviewed the chest x-ray on 03/15/2018 showed subtle airspace opacities in the left mid and lower lung with atelectasis and/or bronchopneumonia in the differential diagnosis.  I agree that he appears to have been over diuresed with significantly lower weights from baseline.  I also agree with resuming Lopressor at a lower dose of 12.5 mg twice daily along with ProAmatine 5 mg twice daily.  I would expect that Lopressor can be increased to 25 mg twice daily which is what he was taking prior to hospitalization.  Diuretics currently on hold.  No additional recommendations at this time.   Kate Sable, MD, [name]  03/20/2018 9:49 AM

## 2018-03-20 NOTE — Progress Notes (Signed)
PROGRESS NOTE    TALEN POSER  ALP:[id number] DOB: November 24, 1932 DOA: 03/15/2018 PCP: Glenda Chroman, MD     Brief Narrative:  84 year old man admitted from home on 10/16 with complaints of shortness of breath was found to be hypoxemic.  This is believed to be due to a combination of hospital-acquired pneumonia as well as acute on chronic heart failure on top of baseline interstitial lung disease/pulmonary fibrosis.  Admission has been requested for further evaluation and management.   Assessment & Plan:   Principal Problem:   Acute on chronic respiratory failure with hypoxemia ([name]) Active Problems:   IPF (idiopathic pulmonary fibrosis) ([name])   HCAP (healthcare-associated pneumonia)   Essential hypertension   Chronic anticoagulation   HLD (hyperlipidemia)   CKD (chronic kidney disease) stage 3, GFR 30-59 ml/min ([name])   Acute on chronic systolic HF (heart failure) ([name])   Acute on chronic hypoxemic respiratory failure -Due to a combination of hospital-acquired pneumonia and acute on chronic systolic heart failure on top of baseline interstitial lung disease/pulmonary fibrosis. -Significantly improved from admission. -Continue oxygen as tolerated, see below for details.  Hospital-acquired pneumonia -Blood cultures have finalized and are negative. -MRSA PCR is negative, as such we will discontinue vancomycin.  Continue cefepime alone for now. -Today is day 6 of 7 of cefepime.  Hypotension -Suspect patient was overly diuresed.  At maximum diuresis he was only 2 L negative. -Discharge was held on 35/32 due to systolic blood pressures in the 60s to 80s. -Given continued hypotension, Lasix and metoprolol had been completely discontinued. -His heart rates with his A. fib are starting to increase.   -Appreciate cardiology input and recommendations.  Acute on chronic systolic heart failure -With a known ejection fraction of 35% per echo in September 2019. -Patient was diuresed 2 L,  but became hypotensive, as of now he is only 400 cc negative since admission. -He would benefit from home health RN upon discharge given his second hospitalization in 6 weeks for heart failure. -Management of his heart failure has been quite difficult due to his hypotension, limiting use of many medications.  I suspect this is why on his prior discharge he was only sent home on Lasix 20 mg as needed.  When I attempted to place him on Lasix 20 mg daily he became significantly hypotensive.  Continued hypotension on 10/20  required complete cessation of beta-blocker as well (I had initially attempted dose reduction from 25 twice daily to 12.5 twice daily). -Beta-blocker discontinued, no ACE inhibitor/ARB/ARN I due to renal dysfunction and hypotension. -Cardiology has recommended reinitiation of beta-blocker as of 10/21.  They have started at a dose of 12.5 twice daily, hopefully will be able to maintain blood pressure.  Chronic kidney disease stage III -Cr remains at baseline.  Hypothyroidism -TSH within normal limits at 2.052, continue home dose of Synthroid.  Interstitial lung disease/pulmonary fibrosis -Will need to continue home oxygen. -Needs outpatient pulmonary referral.  Permanent atrial fibrillation -Continue Xarelto for anticoagulation. -He had been on amiodarone but this was discontinued in the past due to concerns for pulmonary toxicity. -Rates have slightly increased, likely due to necessity of stopping beta-blocker due to hypotension. -Cardiology consultation was requested today, they have recommended restarting low-dose beta-blocker.   DVT prophylaxis: Xarelto Code Status: Full code Family Communication: Wife at bedside updated on plan of care and all questions answered  Disposition Plan: Home pending improvement in blood pressure and cardiology recommendations.  Consultants:   Cardiology  Procedures:   None  Antimicrobials:  Anti-infectives (From admission, onward)     Start     Dose/Rate Route Frequency Ordered Stop   03/17/18 0000  amoxicillin-clavulanate (AUGMENTIN) 500-125 MG tablet     1 tablet Oral 2 times daily 03/17/18 1329 03/22/18 2359   03/16/18 1800  ceFEPIme (MAXIPIME) 1 g in sodium chloride 0.9 % 100 mL IVPB     1 g 200 mL/hr over 30 Minutes Intravenous Every 12 hours 03/16/18 0857     03/16/18 1700  vancomycin (VANCOCIN) 1,250 mg in sodium chloride 0.9 % 250 mL IVPB  Status:  Discontinued     1,250 mg 166.7 mL/hr over 90 Minutes Intravenous Every 24 hours 03/15/18 1559 03/15/18 1853   03/15/18 2200  ceFEPIme (MAXIPIME) 1 g in sodium chloride 0.9 % 100 mL IVPB  Status:  Discontinued     1 g 200 mL/hr over 30 Minutes Intravenous Every 8 hours 03/15/18 1853 03/16/18 0857   03/15/18 1500  vancomycin (VANCOCIN) 1,500 mg in sodium chloride 0.9 % 500 mL IVPB     1,500 mg 250 mL/hr over 120 Minutes Intravenous  Once 03/15/18 1440 03/15/18 1930   03/15/18 1445  ceFEPIme (MAXIPIME) 1 g in sodium chloride 0.9 % 100 mL IVPB  Status:  Discontinued     1 g 200 mL/hr over 30 Minutes Intravenous Every 8 hours 03/15/18 1434 03/15/18 1853       Subjective: Lying in bed, no complaints, still feels dizzy at times especially when he sits up at bedside.  Objective: Vitals:   03/20/18 0500 03/20/18 0555 03/20/18 0935 03/20/18 1329  BP:  118/86 108/77 101/65  Pulse:  (!) 55 89 98  Resp:  18  16  Temp:  98 F (36.7 C)  98.2 F (36.8 C)  TempSrc:  Oral  Oral  SpO2:  96%  96%  Weight: 78.4 kg     Height:        Intake/Output Summary (Last 24 hours) at 03/20/2018 1559 Last data filed at 03/20/2018 1330 Gross per 24 hour  Intake 898.35 ml  Output 250 ml  Net 648.35 ml   Filed Weights   03/18/18 0500 03/19/18 0500 03/20/18 0500  Weight: 77.2 kg 78.1 kg 78.4 kg    Examination:  General exam: Alert, awake, oriented x 2, not to time Respiratory system: Clear to auscultation. Respiratory effort normal. Cardiovascular system:RRR. No murmurs,  rubs, gallops. Gastrointestinal system: Abdomen is nondistended, soft and nontender. No organomegaly or masses felt. Normal bowel sounds heard. Central nervous system: Alert and oriented. No focal neurological deficits. Extremities: No C/C/E, +pedal pulses Skin: No rashes, lesions or ulcers Psychiatry: Judgement and insight appear normal. Mood & affect appropriate.         Data Reviewed: I have personally reviewed following labs and imaging studies  CBC: Recent Labs  Lab 03/15/18 1328 03/16/18 0610 03/17/18 0535 03/20/18 0634  WBC 14.7* 12.1* 10.7* 8.0  NEUTROABS 10.0*  --   --   --   HGB 13.3 12.8* 13.4 12.4*  HCT 42.5 40.5 42.0 39.4  MCV 98.4 98.8 97.4 99.2  PLT 299 267 267 536   Basic Metabolic Panel: Recent Labs  Lab 03/16/18 0610 03/17/18 0535 03/18/18 0740 03/19/18 0655 03/20/18 0634  NA 138 137 138 136 138  K 3.5 3.6 3.7 3.7 3.7  CL 104 107 109 108 109  CO2 25 23 23 25 24   GLUCOSE 111* 117* 107* 96 99  BUN 18 26* 28* 28* 20  CREATININE 1.39* 1.31* 1.14  1.17 1.00  CALCIUM 8.7* 9.0 8.7* 8.5* 8.6*   GFR: Estimated Creatinine Clearance: 54.2 mL/min (by C-G formula based on SCr of 1 mg/dL). Liver Function Tests: No results for input(s): AST, ALT, ALKPHOS, BILITOT, PROT, ALBUMIN in the last 168 hours. No results for input(s): LIPASE, AMYLASE in the last 168 hours. No results for input(s): AMMONIA in the last 168 hours. Coagulation Profile: No results for input(s): INR, PROTIME in the last 168 hours. Cardiac Enzymes: Recent Labs  Lab 03/15/18 1328  TROPONINI <0.03   BNP (last 3 results) No results for input(s): PROBNP in the last 8760 hours. HbA1C: No results for input(s): HGBA1C in the last 72 hours. CBG: Recent Labs  Lab 03/15/18 2231  GLUCAP 116*   Lipid Profile: No results for input(s): CHOL, HDL, LDLCALC, TRIG, CHOLHDL, LDLDIRECT in the last 72 hours. Thyroid Function Tests: No results for input(s): TSH, T4TOTAL, FREET4, T3FREE, THYROIDAB  in the last 72 hours. Anemia Panel: No results for input(s): VITAMINB12, FOLATE, FERRITIN, TIBC, IRON, RETICCTPCT in the last 72 hours. Urine analysis:    Component Value Date/Time   COLORURINE YELLOW 12/26/2014 [place] 12/26/2014 0352   LABSPEC 1.025 12/26/2014 0352   PHURINE 6.0 12/26/2014 0352   GLUCOSEU NEGATIVE 12/26/2014 0352   HGBUR NEGATIVE 12/26/2014 0352   BILIRUBINUR NEGATIVE 12/26/2014 0352   KETONESUR NEGATIVE 12/26/2014 0352   PROTEINUR NEGATIVE 12/26/2014 0352   UROBILINOGEN 0.2 12/26/2014 0352   NITRITE NEGATIVE 12/26/2014 0352   LEUKOCYTESUR SMALL (A) 12/26/2014 0352   Sepsis Labs: @LABRCNTIP (procalcitonin:4,lacticidven:4)  ) Recent Results (from the past 240 hour(s))  Culture, blood (routine x 2) Call MD if unable to obtain prior to antibiotics being given     Status: None   Collection Time: 03/15/18  3:13 PM  Result Value Ref Range Status   Specimen Description BLOOD LEFT ANTECUBITAL  Final   Special Requests   Final    BOTTLES DRAWN AEROBIC AND ANAEROBIC Blood Culture results may not be optimal due to an excessive volume of blood received in culture bottles   Culture   Final    NO GROWTH 5 DAYS Performed at [facility], [address] [place], [place] [address]    Report Status 03/20/2018 FINAL  Final  Culture, blood (routine x 2) Call MD if unable to obtain prior to antibiotics being given     Status: None   Collection Time: 03/15/18  3:13 PM  Result Value Ref Range Status   Specimen Description BLOOD LEFT WRIST  Final   Special Requests   Final    BOTTLES DRAWN AEROBIC AND ANAEROBIC Blood Culture adequate volume   Culture   Final    NO GROWTH 5 DAYS Performed at [facility], [address] [place], [place] [address]    Report Status 03/20/2018 FINAL  Final  MRSA PCR Screening     Status: None   Collection Time: 03/15/18 10:42 PM  Result Value Ref Range Status   MRSA by PCR NEGATIVE NEGATIVE Final    Comment:        The  GeneXpert MRSA Assay (FDA approved for NASAL specimens only), is one component of a comprehensive MRSA colonization surveillance program. It is not intended to diagnose MRSA infection nor to guide or monitor treatment for MRSA infections. Performed at [facility], [address], [place], [place] [address]          Radiology Studies: No results found.      Scheduled Meds: . aspirin EC  81 mg Oral Daily  . levothyroxine  25 mcg Oral QAC breakfast  . mouth rinse  15 mL Mouth Rinse BID  . metoprolol tartrate  12.5 mg Oral BID  . midodrine  5 mg Oral BID WC  . multivitamin with minerals   Oral Daily  . Rivaroxaban  15 mg Oral Q supper  . sodium chloride flush  3 mL Intravenous Q12H   Continuous Infusions: . sodium chloride 250 mL (03/19/18 1026)  . ceFEPime (MAXIPIME) IV 1 g (03/20/18 0710)     LOS: 5 days    Time spent: 25 minutes.    Lelon Frohlich, MD [facility] Pager [phone]  If 7PM-7AM, please contact night-coverage [email or web address] Password [email or web address] 03/20/2018, 3:59 PM

## 2018-03-20 NOTE — Care Management Important Message (Signed)
Important Message  Patient Details  Name: Alejandro [name] MRN: [id number] Date of Birth: 07-26-1932   Medicare Important Message Given:  Yes    Shelda Altes 03/20/2018, 1:01 PM

## 2018-03-21 ENCOUNTER — Ambulatory Visit: Payer: Medicare Other | Admitting: Cardiology

## 2018-03-21 DIAGNOSIS — I4821 Permanent atrial fibrillation: Secondary | ICD-10-CM

## 2018-03-21 DIAGNOSIS — I25118 Atherosclerotic heart disease of native coronary artery with other forms of angina pectoris: Secondary | ICD-10-CM

## 2018-03-21 LAB — BASIC METABOLIC PANEL
ANION GAP: 6 (ref 5–15)
BUN: 21 mg/dL (ref 8–23)
CO2: 24 mmol/L (ref 22–32)
Calcium: 8.6 mg/dL — ABNORMAL LOW (ref 8.9–10.3)
Chloride: 107 mmol/L (ref 98–111)
Creatinine, Ser: 1.1 mg/dL (ref 0.61–1.24)
GLUCOSE: 94 mg/dL (ref 70–99)
POTASSIUM: 4 mmol/L (ref 3.5–5.1)
Sodium: 137 mmol/L (ref 135–145)

## 2018-03-21 LAB — MAGNESIUM: Magnesium: 2 mg/dL (ref 1.7–2.4)

## 2018-03-21 MED ORDER — METOPROLOL TARTRATE 25 MG PO TABS: 12.5000 mg | ORAL_TABLET | Freq: Two times a day (BID) | ORAL | 0 refills | Status: AC

## 2018-03-21 NOTE — Discharge Summary (Signed)
Physician Discharge Summary  VIDUR KNUST OFH:[id number] DOB: 1933-03-24 DOA: 03/15/2018  PCP: Glenda Chroman, MD  Admit date: 03/15/2018 Discharge date: 03/21/2018  Time spent: 45 minutes  Recommendations for Outpatient Follow-up:  -To be discharged home today. -His follow-up with cardiology scheduled.  Discharge Diagnoses:  Principal Problem:   Acute on chronic respiratory failure with hypoxemia ([name]) Active Problems:   IPF (idiopathic pulmonary fibrosis) ([name])   HCAP (healthcare-associated pneumonia)   Essential hypertension   Chronic anticoagulation   HLD (hyperlipidemia)   CKD (chronic kidney disease) stage 3, GFR 30-59 ml/min ([name])   Acute on chronic systolic HF (heart failure) ([place])   Discharge Condition: Stable and improved  Filed Weights   03/19/18 0500 03/20/18 0500 03/21/18 0611  Weight: 78.1 kg 78.4 kg 79.4 kg    History of present illness:  Alejandro [name] is a 84 y.o. male with history of nonischemic cardiomyopathy with a known ejection fraction of 35%, dementia, interstitial lung disease, history of coronary artery disease with most recent cath in March 2016 at which time medical management was recommended, paroxysmal atrial fibrillation maintained on chronic anticoagulation with Xarelto who presents to the hospital today with shortness of breath.  Unfortunately when I examined him there are no family members at bedside and he is a very poor historian on account of his Alzheimer's dementia.  All he can tell me is that he has been very short of breath and went to see his doctor today who sent him to the emergency department.  It appears based on documentation by EDP that patient was found to be hypoxemic at PCPs office prompting referral to the emergency department.  He does have a history of interstitial lung disease and on chest x-ray today appears to have superimposed pneumonia.,  Also possibility of acute heart failure given BNP of about 1400.  Admission has  been requested for further evaluation and management  Hospital Course:   Acute on chronic hypoxemic respiratory failure -Due to a combination of hospital-acquired pneumonia and acute on chronic systolic heart failure on top of baseline interstitial lung disease/pulmonary fibrosis. -Significantly improved from admission. -Continue oxygen as tolerated, see below for details.  Hospital-acquired pneumonia -Blood cultures have finalized and are negative. -MRSA PCR is negative, as such we will discontinue vancomycin.  Continue cefepime alone for now. -Has completed 7 days of antibiotics while hospitalized.  Hypotension -Suspect patient was overly diuresed.  At maximum diuresis he was only 2 L negative. -Discharge was held on 54/98 due to systolic blood pressures in the 60s to 80s. -Given continued hypotension, Lasix and metoprolol had been completely discontinued. -His heart rates with his A. fib are starting to increase.   -Cardiology recommended starting low-dose beta-blocker 12.5 mg twice daily in addition midodrine.  He seems to be tolerating this well with systolics in the 26E.  Acute on chronic systolic heart failure -With a known ejection fraction of 35% per echo in September 2019. -Patient was diuresed 2 L, but became hypotensive, as of now he is only 400 cc negative since admission. -He would benefit from home health RN upon discharge given his second hospitalization in 6 weeks for heart failure. -Management of his heart failure has been quite difficult due to his hypotension, limiting use of many medications.  I suspect this is why on his prior discharge he was only sent home on Lasix 20 mg as needed.  When I attempted to place him on Lasix 20 mg daily he became significantly  hypotensive.  Continued hypotension on 10/20  required complete cessation of beta-blocker as well (I had initially attempted dose reduction from 25 twice daily to 12.5 twice daily). -Beta-blocker discontinued,  no ACE inhibitor/ARB/ARN I due to renal dysfunction and hypotension. -Cardiology has recommended reinitiation of beta-blocker as of 10/21.  They have started at a dose of 12.5 twice daily, and this will be his DC regimen. Will need follow up with his OP cardiologist.  Chronic kidney disease stage III -Cr remains at baseline.  Hypothyroidism -TSH within normal limits at 2.052, continue home dose of Synthroid.  Interstitial lung disease/pulmonary fibrosis -Will need to continue home oxygen. -Needs outpatient pulmonary referral.  Permanent atrial fibrillation -Continue Xarelto for anticoagulation. -He had been on amiodarone but this was discontinued in the past due to concerns for pulmonary toxicity. -Rates have slightly increased, likely due to necessity of stopping beta-blocker due to hypotension. -Low dose BB has been resumed.  Procedures:  None   Consultations:  Cardiology  Discharge Instructions  Discharge Instructions    Diet - low sodium heart healthy   Complete by:  As directed    Diet - low sodium heart healthy   Complete by:  As directed    Increase activity slowly   Complete by:  As directed    Increase activity slowly   Complete by:  As directed      Allergies as of 03/21/2018   No Known Allergies     Medication List    STOP taking these medications   furosemide 20 MG tablet Commonly known as:  LASIX     TAKE these medications   acetaminophen 500 MG tablet Commonly known as:  TYLENOL Take 1,000-1,500 mg by mouth every 6 (six) hours as needed for mild pain or moderate pain.   levothyroxine 25 MCG tablet Commonly known as:  SYNTHROID, LEVOTHROID Take 25 mcg by mouth daily before breakfast.   metoprolol tartrate 25 MG tablet Commonly known as:  LOPRESSOR Take 0.5 tablets (12.5 mg total) by mouth 2 (two) times daily. What changed:  how much to take   midodrine 5 MG tablet Commonly known as:  PROAMATINE Take 5 mg by mouth 2 (two) times daily  with a meal.   MULTIVITAMIN MEN 50+ PO Take 1 tablet by mouth daily.   XARELTO 15 MG Tabs tablet Generic drug:  Rivaroxaban TAKE 1 TABLET BY MOUTH DAILY WITH SUPPER.      No Known Allergies Follow-up Information    [name], Dhruv B, MD. Schedule an appointment as soon as possible for a visit in 2 week(s).   Specialty:  Internal Medicine Contact information: [place] [address] [phone]        Satira Sark, MD .   Specialty:  Cardiology Contact information: [place] [address] [phone]        Thompson Grayer, MD .   Specialty:  Cardiology Contact information: [place] [address] [phone]            The results of significant diagnostics from this hospitalization (including imaging, microbiology, ancillary and laboratory) are listed below for reference.    Significant Diagnostic Studies: Dg Chest Port 1 View  Result Date: 03/15/2018 CLINICAL DATA:  Productive cough and dyspnea EXAM: PORTABLE CHEST 1 VIEW COMPARISON:  01/31/2018 CXR and chest CT 02/03/2018 FINDINGS: Stable cardiomegaly with minimal aortic atherosclerosis. Superimposed on chronic interstitial prominence are subtle areas of confluent opacity at the left mid  and lower lung. Findings are suspicious for atelectasis and/or minimal bronchopneumonia. IMPRESSION: Superimposed on chronic interstitial lung disease or or subtle airspace opacities in the left mid and lower lung. Atelectasis and/or bronchopneumonia might account for this appearance. Stable cardiomegaly with aortic atherosclerosis. Electronically Signed   By: Ashley Royalty M.D.   On: 03/15/2018 14:30    Microbiology: Recent Results (from the past 240 hour(s))  Culture, blood (routine x 2) Call MD if unable to obtain prior to antibiotics being given     Status: None   Collection Time: 03/15/18  3:13 PM  Result Value Ref Range Status   Specimen Description BLOOD LEFT ANTECUBITAL   Final   Special Requests   Final    BOTTLES DRAWN AEROBIC AND ANAEROBIC Blood Culture results may not be optimal due to an excessive volume of blood received in culture bottles   Culture   Final    NO GROWTH 5 DAYS Performed at [facility], [address], [place], [place] [address]    Report Status 03/20/2018 FINAL  Final  Culture, blood (routine x 2) Call MD if unable to obtain prior to antibiotics being given     Status: None   Collection Time: 03/15/18  3:13 PM  Result Value Ref Range Status   Specimen Description BLOOD LEFT WRIST  Final   Special Requests   Final    BOTTLES DRAWN AEROBIC AND ANAEROBIC Blood Culture adequate volume   Culture   Final    NO GROWTH 5 DAYS Performed at [facility], [address], [place], [place] [address]    Report Status 03/20/2018 FINAL  Final  MRSA PCR Screening     Status: None   Collection Time: 03/15/18 10:42 PM  Result Value Ref Range Status   MRSA by PCR NEGATIVE NEGATIVE Final    Comment:        The GeneXpert MRSA Assay (FDA approved for NASAL specimens only), is one component of a comprehensive MRSA colonization surveillance program. It is not intended to diagnose MRSA infection nor to guide or monitor treatment for MRSA infections. Performed at [facility], [address] [place], [place] [address]      Labs: Basic Metabolic Panel: Recent Labs  Lab 03/17/18 0535 03/18/18 0740 03/19/18 0655 03/20/18 0634 03/21/18 0623  NA 137 138 136 138 137  K 3.6 3.7 3.7 3.7 4.0  CL 107 109 108 109 107  CO2 23 23 25 24 24   GLUCOSE 117* 107* 96 99 94  BUN 26* 28* 28* 20 21  CREATININE 1.31* 1.14 1.17 1.00 1.10  CALCIUM 9.0 8.7* 8.5* 8.6* 8.6*  MG  --   --   --   --  2.0   Liver Function Tests: No results for input(s): AST, ALT, ALKPHOS, BILITOT, PROT, ALBUMIN in the last 168 hours. No results for input(s): LIPASE, AMYLASE in the last 168 hours. No results for input(s): AMMONIA in the last 168 hours. CBC: Recent Labs  Lab  03/15/18 1328 03/16/18 0610 03/17/18 0535 03/20/18 0634  WBC 14.7* 12.1* 10.7* 8.0  NEUTROABS 10.0*  --   --   --   HGB 13.3 12.8* 13.4 12.4*  HCT 42.5 40.5 42.0 39.4  MCV 98.4 98.8 97.4 99.2  PLT 299 267 267 253   Cardiac Enzymes: Recent Labs  Lab 03/15/18 1328  TROPONINI <0.03   BNP: BNP (last 3 results) Recent Labs    01/31/18 1027 03/15/18 1328  BNP 1,122.0* 1,477.0*    ProBNP (last 3 results)  No results for input(s): PROBNP in the last 8760 hours.  CBG: Recent Labs  Lab 03/15/18 2231  GLUCAP 116*       Signed:  Lelon Frohlich  [facility] Pager: [phone] 03/21/2018, 12:57 PM

## 2018-03-21 NOTE — Progress Notes (Addendum)
Progress Note  Patient Name: Alejandro [name] Date of Encounter: 03/21/2018  Primary Cardiologist: Rozann Lesches, MD   Subjective   Reports breathing is close to baseline. Denies any chest pain or palpitations. Asks when his wife will be by to visit.   Inpatient Medications    Scheduled Meds: . aspirin EC  81 mg Oral Daily  . levothyroxine  25 mcg Oral QAC breakfast  . mouth rinse  15 mL Mouth Rinse BID  . metoprolol tartrate  12.5 mg Oral BID  . midodrine  5 mg Oral BID WC  . multivitamin with minerals   Oral Daily  . Rivaroxaban  15 mg Oral Q supper  . sodium chloride flush  3 mL Intravenous Q12H   Continuous Infusions: . sodium chloride 10 mL/hr at 03/21/18 0434  . ceFEPime (MAXIPIME) IV 1 g (03/21/18 0534)   PRN Meds: sodium chloride, acetaminophen **OR** acetaminophen, levalbuterol, ondansetron **OR** ondansetron (ZOFRAN) IV, senna-docusate, sodium chloride flush   Vital Signs    Vitals:   03/20/18 1929 03/20/18 2103 03/20/18 2105 03/21/18 0611  BP:  103/87  96/69  Pulse:  (!) 44 (!) 52 (!) 46  Resp:  17  15  Temp:  98.3 F (36.8 C)  98.4 F (36.9 C)  TempSrc:  Oral  Oral  SpO2: 94% 97%  96%  Weight:    79.4 kg  Height:        Intake/Output Summary (Last 24 hours) at 03/21/2018 0805 Last data filed at 03/21/2018 0454 Gross per 24 hour  Intake 1028.51 ml  Output 775 ml  Net 253.51 ml   Filed Weights   03/19/18 0500 03/20/18 0500 03/21/18 0611  Weight: 78.1 kg 78.4 kg 79.4 kg    Telemetry    Atrial fibrillation, HR in 80's to 110's. Occasional PVC's and episodes of ventricular bigeminy.  - Personally Reviewed  ECG    No new tracings.   Physical Exam   General: Well developed, elderly Caucasian male appearing in no acute distress. Head: Normocephalic, atraumatic.  Neck: Supple without bruits, JVD not elevated. Lungs:  Resp regular and unlabored, scattered rhonchi. Heart: Irregularly irregular, S1, S2, no S3, S4, or murmur; no  rub. Abdomen: Soft, non-tender, non-distended with normoactive bowel sounds. No hepatomegaly. No rebound/guarding. No obvious abdominal masses. Extremities: No clubbing, cyanosis, or lower extremity edema. Distal pedal pulses are 2+ bilaterally. Neuro: Alert and oriented X 3. Moves all extremities spontaneously. Psych: Normal affect.  Labs    Chemistry Recent Labs  Lab 03/19/18 0655 03/20/18 0634 03/21/18 0623  NA 136 138 137  K 3.7 3.7 4.0  CL 108 109 107  CO2 25 24 24   GLUCOSE 96 99 94  BUN 28* 20 21  CREATININE 1.17 1.00 1.10  CALCIUM 8.5* 8.6* 8.6*  GFRNONAA 56* >60 >60  GFRAA >60 >60 >60  ANIONGAP 3* 5 6     Hematology Recent Labs  Lab 03/16/18 0610 03/17/18 0535 03/20/18 0634  WBC 12.1* 10.7* 8.0  RBC 4.10* 4.31 3.97*  HGB 12.8* 13.4 12.4*  HCT 40.5 42.0 39.4  MCV 98.8 97.4 99.2  MCH 31.2 31.1 31.2  MCHC 31.6 31.9 31.5  RDW 14.0 13.8 13.6  PLT 267 267 253    Cardiac Enzymes Recent Labs  Lab 03/15/18 1328  TROPONINI <0.03   No results for input(s): TROPIPOC in the last 168 hours.   BNP Recent Labs  Lab 03/15/18 1328  BNP 1,477.0*     DDimer No results for input(s): DDIMER  in the last 168 hours.   Radiology    No results found.  Cardiac Studies   Echocardiogram: 01/2018 Study Conclusions  - Left ventricle: The cavity size was normal. Wall thickness was   increased in a pattern of mild LVH. The estimated ejection   fraction was 35%. Diffuse hypokinesis. There is hypokinesis of   the basalinferolateral myocardium. The study was not technically   sufficient to allow evaluation of LV diastolic dysfunction due to   atrial fibrillation. - Aortic valve: Mildly calcified annulus. Trileaflet; mildly   calcified leaflets. There was trivial regurgitation. - Mitral valve: There was mild regurgitation. - Left atrium: The atrium was mildly dilated. - Right atrium: Central venous pressure (est): 3 mm Hg. - Atrial septum: No defect or patent  foramen ovale was identified. - Tricuspid valve: There was trivial regurgitation. - Pulmonary arteries: Systolic pressure was moderately increased.   PA peak pressure: 50 mm Hg (S). - Pericardium, extracardiac: There was no pericardial effusion.  Patient Profile     84 y.o. male w/ PMH of moderate CAD (by cath in 07/2014 showing aneurysmal RCA with sluggish flow and possible chronic layered distal thrombus and Ost LCx stenosis treated medically), NSVT, persistent atrial fibrillation (on Xarelto), Stage 3 CKD, mixed NICM (EF 35% by echo in 01/2018), prior TIA, and dementia who is currently admitted for acute on chronic hypoxic respiratory failure in the setting of HCAP and CHF. Cardiology consulted to assist with management of CHF and Atrial Fibrillation.   Assessment & Plan    1. Acute Hypoxic Respiratory Failure in the setting of HCAP and Acute on Chronic Systolic CHF - He has a known reduced EF of 35% by echo in 01/2018 which has been managed medically given his dementia. Strict I&O's have not been recorded but weight has declined from 184 lbs on admission to 175 lbs today. He appears euvolemic by examination and reports significant improvement in his respiratory status. No longer on Lasix and would plan to resume at PRN dosing upon the time of hospital discharge. His soft BP does not allow for the addition of ACE-I/ARB/ARNI. - remains on Cefepime for HCAP.   2. Permanent Atrial Fibrillation - HR has been variable from the 80's to 110's with frequent PVC's and episodes of ventricular bigeminy. K+ 4.0. Will add-on Mg to AM labs. PM Lopressor held due to bradycardia (?). Suspect this was due to his bigeminy as telemetry shows no significant bradycardia. Reviewed with patient's nurse today. Hold parameters in place.  - he denies any evidence of active bleeding. Remains on Xarelto for anticoagulation.   3. Hypotension - BP has improved as this has been stable at 96/65 - 108/87 within the past 24  hours. Has been started on Lopressor 12.5mg  BID and restarted on Midodrine 5mg  BID.   4. CAD - cath in 07/2014 showing aneurysmal RCA with sluggish flow and possible chronic layered distal thrombus and Ost LCx stenosis treated medically. Troponin value negative this admission and he denies any recent anginal symptoms.  - continue BB therapy. Stop ASA given the concurrent use of Xarelto (was not on ASA prior to admission). No longer on statin therapy (possibly due to recent memory issues).   5. Stage 3 CKD - baseline creatinine 1.1 - 1.2. Stable at 1.10 this AM.    For questions or updates, please contact [place] Please consult [email or web address] for contact info under Cardiology/STEMI.   Arna Medici , PA-C 8:05 AM 03/21/2018 Pager: [phone]  The patient  was seen and examined, and I agree with the history, physical exam, assessment and plan as documented above.  The patient is feeling well this morning and denies chest pain and palpitations.  Shortness of breath is stable.  He did have some PVCs and ventricular bigeminy.  Potassium and magnesium levels are normal.  I would continue Lopressor 12.5 mg twice daily and ProAmatine 5 mg twice daily.  I agree with as needed Lasix dosing.  I agree with stopping aspirin as he is on Xarelto.  No further recommendations.  Stable for discharge from my standpoint.  [facility] will sign off.   Medication Recommendations:  As above. Other recommendations (labs, testing, etc):  None Follow up as an outpatient:  As scheduled.   Kate Sable, MD, [facility]  03/21/2018 10:40 AM

## 2018-03-21 NOTE — Care Management Note (Signed)
Case Management Note  Patient Details  Name: [name] MRN: [id number] Date of Birth: 1933-03-14  Expected Discharge Date:  03/21/18               Expected Discharge Plan:  [place]  In-House Referral:  NA  Discharge planning Services  CM Consult  Post Acute Care Choice:  Home Health, Resumption of Svcs/PTA Provider Choice offered to:  Patient  DME Arranged:    DME Agency:     HH Arranged:  RN [place] Agency:  [place]  Status of Service:  Completed, signed off  Additional Comments: DC home today. [facility] rep aware of DC today. Pt has [place] orders in chart for resumption. Pt aware HH has 48 hrs to make resumption visit.   Sherald Barge, RN 03/21/2018, 2:12 PM

## 2018-03-21 NOTE — Progress Notes (Signed)
Pt's IV catheter removed and intact. Pt's IV site clean dry and intact. Discharge instructions including medications and follow up appointments were reviewed and discussed with patient's wife. All questions were answered and no further questions at this time. Patient in stable condition and in no acute distress at time of discharge. Pt escorted by nurse tech

## 2018-03-22 DIAGNOSIS — I480 Paroxysmal atrial fibrillation: Secondary | ICD-10-CM | POA: Diagnosis not present

## 2018-03-22 DIAGNOSIS — N183 Chronic kidney disease, stage 3 (moderate): Secondary | ICD-10-CM | POA: Diagnosis not present

## 2018-03-22 DIAGNOSIS — I5043 Acute on chronic combined systolic (congestive) and diastolic (congestive) heart failure: Secondary | ICD-10-CM | POA: Diagnosis not present

## 2018-03-22 DIAGNOSIS — I2510 Atherosclerotic heart disease of native coronary artery without angina pectoris: Secondary | ICD-10-CM | POA: Diagnosis not present

## 2018-03-22 DIAGNOSIS — G309 Alzheimer's disease, unspecified: Secondary | ICD-10-CM | POA: Diagnosis not present

## 2018-03-22 DIAGNOSIS — I130 Hypertensive heart and chronic kidney disease with heart failure and stage 1 through stage 4 chronic kidney disease, or unspecified chronic kidney disease: Secondary | ICD-10-CM | POA: Diagnosis not present

## 2018-03-24 DIAGNOSIS — I130 Hypertensive heart and chronic kidney disease with heart failure and stage 1 through stage 4 chronic kidney disease, or unspecified chronic kidney disease: Secondary | ICD-10-CM | POA: Diagnosis not present

## 2018-03-24 DIAGNOSIS — I5043 Acute on chronic combined systolic (congestive) and diastolic (congestive) heart failure: Secondary | ICD-10-CM | POA: Diagnosis not present

## 2018-03-24 DIAGNOSIS — I480 Paroxysmal atrial fibrillation: Secondary | ICD-10-CM | POA: Diagnosis not present

## 2018-03-24 DIAGNOSIS — I2510 Atherosclerotic heart disease of native coronary artery without angina pectoris: Secondary | ICD-10-CM | POA: Diagnosis not present

## 2018-03-24 DIAGNOSIS — G309 Alzheimer's disease, unspecified: Secondary | ICD-10-CM | POA: Diagnosis not present

## 2018-03-24 DIAGNOSIS — N183 Chronic kidney disease, stage 3 (moderate): Secondary | ICD-10-CM | POA: Diagnosis not present

## 2018-03-27 ENCOUNTER — Telehealth: Payer: Self-pay | Admitting: Internal Medicine

## 2018-03-27 DIAGNOSIS — N183 Chronic kidney disease, stage 3 (moderate): Secondary | ICD-10-CM | POA: Diagnosis not present

## 2018-03-27 DIAGNOSIS — I2510 Atherosclerotic heart disease of native coronary artery without angina pectoris: Secondary | ICD-10-CM | POA: Diagnosis not present

## 2018-03-27 DIAGNOSIS — R42 Dizziness and giddiness: Secondary | ICD-10-CM

## 2018-03-27 DIAGNOSIS — I130 Hypertensive heart and chronic kidney disease with heart failure and stage 1 through stage 4 chronic kidney disease, or unspecified chronic kidney disease: Secondary | ICD-10-CM | POA: Diagnosis not present

## 2018-03-27 DIAGNOSIS — I5043 Acute on chronic combined systolic (congestive) and diastolic (congestive) heart failure: Secondary | ICD-10-CM | POA: Diagnosis not present

## 2018-03-27 DIAGNOSIS — I480 Paroxysmal atrial fibrillation: Secondary | ICD-10-CM | POA: Diagnosis not present

## 2018-03-27 DIAGNOSIS — G309 Alzheimer's disease, unspecified: Secondary | ICD-10-CM | POA: Diagnosis not present

## 2018-03-27 NOTE — Telephone Encounter (Signed)
Alejandro Leavell, RN  Satira Sark, MD; [facility] Triage 29 minutes ago (12:50 PM)    Pt is having orthostatic hypotension after recent ER visit. Please see what Dr. Domenic Polite would advise as far as continued metoprolol and midodrine.   Thanks   Routing

## 2018-03-27 NOTE — Telephone Encounter (Signed)
Per the home care nurse the patient has drop in BP when standing that causes him to feel dizzy. BP was taken manually. Pulse is 90 and irregular. Patient is only dizzy when standing, complains of no other symptoms. Patient is to see [facility] on 11/01.

## 2018-03-27 NOTE — Telephone Encounter (Signed)
New message:      STAT if patient feels like he/she is going to faint   1) Are you dizzy now? yes  2) Do you feel faint or have you passed out? No  3) Do you have any other symptoms? Irregular Pulse/Pulse  4) Have you checked your HR and BP (record if available)? 110/58 sitting in the lft arm/ standing lft arm 58/50

## 2018-03-28 MED ORDER — MIDODRINE HCL 5 MG PO TABS: 5.0000 mg | ORAL_TABLET | Freq: Three times a day (TID) | ORAL | 1 refills | Status: AC

## 2018-03-28 NOTE — Telephone Encounter (Signed)
Somewhat difficult treatment issue since he has persistent atrial fibrillation which requires rate control but also orthostasis.  If he is not already using compression stockings, that should be pursued.  Otherwise midodrine could be increased to 5 mg 3 times daily.

## 2018-03-28 NOTE — Telephone Encounter (Signed)
Pt not currently using compression stockings per home health nurse - also spoke with pt and wife who voiced understanding - will fax rx for compression stockings

## 2018-03-29 ENCOUNTER — Telehealth: Payer: Self-pay | Admitting: Cardiology

## 2018-03-29 DIAGNOSIS — I2510 Atherosclerotic heart disease of native coronary artery without angina pectoris: Secondary | ICD-10-CM | POA: Diagnosis not present

## 2018-03-29 DIAGNOSIS — I130 Hypertensive heart and chronic kidney disease with heart failure and stage 1 through stage 4 chronic kidney disease, or unspecified chronic kidney disease: Secondary | ICD-10-CM | POA: Diagnosis not present

## 2018-03-29 DIAGNOSIS — I5043 Acute on chronic combined systolic (congestive) and diastolic (congestive) heart failure: Secondary | ICD-10-CM | POA: Diagnosis not present

## 2018-03-29 DIAGNOSIS — I480 Paroxysmal atrial fibrillation: Secondary | ICD-10-CM | POA: Diagnosis not present

## 2018-03-29 DIAGNOSIS — G309 Alzheimer's disease, unspecified: Secondary | ICD-10-CM | POA: Diagnosis not present

## 2018-03-29 DIAGNOSIS — N183 Chronic kidney disease, stage 3 (moderate): Secondary | ICD-10-CM | POA: Diagnosis not present

## 2018-03-29 NOTE — Telephone Encounter (Signed)
Please ask Dr. Domenic Polite what strength.

## 2018-03-29 NOTE — Telephone Encounter (Signed)
Received telephone call from [facility] in regards to recent RX that was faxed to them for compression stockings.  They need to know which strength of compression does Alejandro [name] need?   Please call [phone] ext [phone] ask for Sarah.

## 2018-03-30 DIAGNOSIS — I5043 Acute on chronic combined systolic (congestive) and diastolic (congestive) heart failure: Secondary | ICD-10-CM | POA: Diagnosis not present

## 2018-03-30 DIAGNOSIS — G309 Alzheimer's disease, unspecified: Secondary | ICD-10-CM | POA: Diagnosis not present

## 2018-03-30 DIAGNOSIS — Z6826 Body mass index (BMI) 26.0-26.9, adult: Secondary | ICD-10-CM | POA: Diagnosis not present

## 2018-03-30 DIAGNOSIS — N183 Chronic kidney disease, stage 3 (moderate): Secondary | ICD-10-CM | POA: Diagnosis not present

## 2018-03-30 DIAGNOSIS — I2510 Atherosclerotic heart disease of native coronary artery without angina pectoris: Secondary | ICD-10-CM | POA: Diagnosis not present

## 2018-03-30 DIAGNOSIS — I509 Heart failure, unspecified: Secondary | ICD-10-CM | POA: Diagnosis not present

## 2018-03-30 DIAGNOSIS — I480 Paroxysmal atrial fibrillation: Secondary | ICD-10-CM | POA: Diagnosis not present

## 2018-03-30 DIAGNOSIS — F0390 Unspecified dementia without behavioral disturbance: Secondary | ICD-10-CM | POA: Diagnosis not present

## 2018-03-30 DIAGNOSIS — I130 Hypertensive heart and chronic kidney disease with heart failure and stage 1 through stage 4 chronic kidney disease, or unspecified chronic kidney disease: Secondary | ICD-10-CM | POA: Diagnosis not present

## 2018-03-30 DIAGNOSIS — I10 Essential (primary) hypertension: Secondary | ICD-10-CM | POA: Diagnosis not present

## 2018-03-30 DIAGNOSIS — Z299 Encounter for prophylactic measures, unspecified: Secondary | ICD-10-CM | POA: Diagnosis not present

## 2018-03-31 ENCOUNTER — Encounter: Payer: Self-pay | Admitting: Internal Medicine

## 2018-03-31 ENCOUNTER — Ambulatory Visit (INDEPENDENT_AMBULATORY_CARE_PROVIDER_SITE_OTHER): Payer: Medicare Other | Admitting: Internal Medicine

## 2018-03-31 VITALS — BP 93/61 | HR 91 | Ht 68.0 in | Wt 173.0 lb

## 2018-03-31 DIAGNOSIS — I639 Cerebral infarction, unspecified: Secondary | ICD-10-CM

## 2018-03-31 DIAGNOSIS — Z87898 Personal history of other specified conditions: Secondary | ICD-10-CM | POA: Diagnosis not present

## 2018-03-31 DIAGNOSIS — I10 Essential (primary) hypertension: Secondary | ICD-10-CM | POA: Diagnosis not present

## 2018-03-31 DIAGNOSIS — I4819 Other persistent atrial fibrillation: Secondary | ICD-10-CM

## 2018-03-31 DIAGNOSIS — I428 Other cardiomyopathies: Secondary | ICD-10-CM

## 2018-03-31 NOTE — Patient Instructions (Addendum)
Medication Instructions:  Continue all current medications.  Labwork: none  Testing/Procedures: none  Follow-Up: As neefded  Any Other Special Instructions Will Be Listed Below (If Applicable). Daily weights.   If you need a refill on your cardiac medications before your next appointment, please call your pharmacy.

## 2018-03-31 NOTE — Progress Notes (Signed)
PCP: Glenda Chroman, MD Primary Cardiologist: Dr Domenic Polite Primary EP: Dr Rayann Heman  Alejandro [name] is a 84 y.o. male who presents today for add on electrophysiology followup.  He was recently hospitalized at [facility] for pneumonia/ CHF (Dr Court Joy notes reviewed).  He had afib with RVR also noted.  He has previously been on amiodarone however this was stopped due to concerns for amiodarone toxicity.  Today, he denies symptoms of palpitations, chest pain,  lower extremity edema, dizziness, presyncope, or syncope.  The patient is otherwise without complaint today.  He is currently doing well.  His primary concern is with unsteady balance.  Past Medical History:  Diagnosis Date  . Alzheimer's dementia ([place])   . CAD (coronary artery disease)    a. 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 Med rx recommended, EF 30-35% at that cath.  . CHF (congestive heart failure) ([place])   . Chronic shortness of breath   . CKD (chronic kidney disease), stage III ([place])   . Essential hypertension   . ILD (interstitial lung disease) ([place]) 01/2018   via CT scan   . Memory loss   . Non-ischemic cardiomyopathy ([place])    EF 35%  . NSVT (nonsustained ventricular tachycardia) ([name])    Negative EP study  . On home O2    prn during day, continuous qhs  . Orthostatic hypotension   . Paroxysmal atrial fibrillation ([name])   . Secondary cardiomyopathy ([place])    LVEF 40-45% January 2014  . Syncope   . TIA (transient ischemic attack)   . Uses walker    Past Surgical History:  Procedure Laterality Date  . CARDIOVERSION N/A 10/19/2016   Procedure: CARDIOVERSION;  Surgeon: [name], MD;  Location: [facility] ENDO [facility];  Service: Cardiovascular;  Laterality: N/A;  . CATARACT EXTRACTION    . COLONOSCOPY  unknown   . COLONOSCOPY N/A 09/05/2014   Procedure: COLONOSCOPY;  Surgeon: [name], MD;  Location: [facility] ENDO SUITE;  Service: Endoscopy;  Laterality: N/A;  145pm  . ELECTROPHYSIOLOGY STUDY N/A 08/05/2014   Procedure: ELECTROPHYSIOLOGY STUDY;  Surgeon: Deboraha Sprang, MD;  Location: [facility] CATH LAB;  Service: Cardiovascular;  Laterality: N/A;  . LAPAROSCOPIC APPENDECTOMY  November 2015   Dr. [name]  . LEFT HEART CATHETERIZATION WITH CORONARY ANGIOGRAM N/A 07/31/2014   Procedure: LEFT HEART CATHETERIZATION WITH CORONARY ANGIOGRAM;  Surgeon: [name], MD;  Location: [facility] CATH LAB;  Service: Cardiovascular;  Laterality: N/A;  . LOOP RECORDER IMPLANT    . LOOP RECORDER REMOVAL  11/09/2017   MDT LINQ loop recorder removed for end of battery life of the device by Dr Rayann Heman    ROS- all systems are reviewed and negatives except as per HPI above  Current Outpatient Medications  Medication Sig Dispense Refill  . acetaminophen (TYLENOL) 500 MG tablet Take 1,000-1,500 mg by mouth every 6 (six) hours as needed for mild pain or moderate pain.    Marland Kitchen amoxicillin-clavulanate (AUGMENTIN) 500-125 MG tablet Take 1 tablet by mouth 3 (three) times daily.    Marland Kitchen levothyroxine (SYNTHROID, LEVOTHROID) 25 MCG tablet Take 25 mcg by mouth daily before breakfast.    . metoprolol tartrate (LOPRESSOR) 25 MG tablet Take 0.5 tablets (12.5 mg total) by mouth 2 (two) times daily. 30 tablet 0  . midodrine (PROAMATINE) 5 MG tablet Take 1 tablet (5  mg total) by mouth 3 (three) times daily with meals. (Patient taking differently: Take 10 mg by mouth 3 (three) times daily with meals. ) 90 tablet 1  . Multiple Vitamins-Minerals (MULTIVITAMIN MEN 50+ PO) Take 1 tablet by mouth daily.    Alveda Reasons 15 MG TABS tablet TAKE 1 TABLET BY MOUTH DAILY WITH SUPPER. 30 tablet 6   No current facility-administered medications for this visit.     Physical Exam: Vitals:   03/31/18 1006  BP: 93/61  Pulse: 91  SpO2: 92%  Weight: 173 lb (78.5 kg)    Height: 5\' 8"  (1.727 m)    GEN- The patient is elderly appearing, aler  Head- normocephalic, atraumatic Eyes-  Sclera clear, conjunctiva pink Ears- hearing intact Oropharynx- clear Lungs- Clear to ausculation bilaterally, normal work of breathing Heart- irregular rate and rhythm  GI- soft, NT, ND, + BS Extremities- no clubbing, cyanosis, or edema  Wt Readings from Last 3 Encounters:  03/31/18 173 lb (78.5 kg)  03/21/18 175 lb 0.7 oz (79.4 kg)  02/27/18 176 lb (79.8 kg)    Assessment and Plan:  1. Persistent afib On xarelto Rate controlled currently.  Recent RVR likely due to pneumonia/ hypoxia. No changes advised today  2. Nonischemic CM EF 35% Medical therapy limited by hypotension Daily weights and prn lasix discussed today Followed by Dr Domenic Polite  3. Syncope ILR previously x 3 years (now explanted) without arrhythmogenic cause for syncope seen.  Negative prior EPS.  Given advanced age and dementia, I would not advise ICD for this patient  4. Orthostatic hypotension Stable No change required today  5. HTN Stable No change required today  Follow-up with Dr Domenic Polite as scheduled I will see as needed going forward  Thompson Grayer MD, [facility] 03/31/2018 10:36 AM

## 2018-03-31 NOTE — Telephone Encounter (Signed)
Per Domenic Polite, low pressure compression stockings. Sarah at [facility] informed.

## 2018-04-03 DIAGNOSIS — N183 Chronic kidney disease, stage 3 (moderate): Secondary | ICD-10-CM | POA: Diagnosis not present

## 2018-04-03 DIAGNOSIS — I130 Hypertensive heart and chronic kidney disease with heart failure and stage 1 through stage 4 chronic kidney disease, or unspecified chronic kidney disease: Secondary | ICD-10-CM | POA: Diagnosis not present

## 2018-04-03 DIAGNOSIS — I480 Paroxysmal atrial fibrillation: Secondary | ICD-10-CM | POA: Diagnosis not present

## 2018-04-03 DIAGNOSIS — I5043 Acute on chronic combined systolic (congestive) and diastolic (congestive) heart failure: Secondary | ICD-10-CM | POA: Diagnosis not present

## 2018-04-03 DIAGNOSIS — I2510 Atherosclerotic heart disease of native coronary artery without angina pectoris: Secondary | ICD-10-CM | POA: Diagnosis not present

## 2018-04-03 DIAGNOSIS — G309 Alzheimer's disease, unspecified: Secondary | ICD-10-CM | POA: Diagnosis not present

## 2018-04-04 DIAGNOSIS — I2510 Atherosclerotic heart disease of native coronary artery without angina pectoris: Secondary | ICD-10-CM | POA: Diagnosis not present

## 2018-04-04 DIAGNOSIS — I130 Hypertensive heart and chronic kidney disease with heart failure and stage 1 through stage 4 chronic kidney disease, or unspecified chronic kidney disease: Secondary | ICD-10-CM | POA: Diagnosis not present

## 2018-04-04 DIAGNOSIS — G309 Alzheimer's disease, unspecified: Secondary | ICD-10-CM | POA: Diagnosis not present

## 2018-04-04 DIAGNOSIS — I480 Paroxysmal atrial fibrillation: Secondary | ICD-10-CM | POA: Diagnosis not present

## 2018-04-04 DIAGNOSIS — N183 Chronic kidney disease, stage 3 (moderate): Secondary | ICD-10-CM | POA: Diagnosis not present

## 2018-04-04 DIAGNOSIS — I5043 Acute on chronic combined systolic (congestive) and diastolic (congestive) heart failure: Secondary | ICD-10-CM | POA: Diagnosis not present

## 2018-04-05 DIAGNOSIS — N183 Chronic kidney disease, stage 3 (moderate): Secondary | ICD-10-CM | POA: Diagnosis not present

## 2018-04-05 DIAGNOSIS — I130 Hypertensive heart and chronic kidney disease with heart failure and stage 1 through stage 4 chronic kidney disease, or unspecified chronic kidney disease: Secondary | ICD-10-CM | POA: Diagnosis not present

## 2018-04-05 DIAGNOSIS — G309 Alzheimer's disease, unspecified: Secondary | ICD-10-CM | POA: Diagnosis not present

## 2018-04-05 DIAGNOSIS — I5043 Acute on chronic combined systolic (congestive) and diastolic (congestive) heart failure: Secondary | ICD-10-CM | POA: Diagnosis not present

## 2018-04-05 DIAGNOSIS — I2510 Atherosclerotic heart disease of native coronary artery without angina pectoris: Secondary | ICD-10-CM | POA: Diagnosis not present

## 2018-04-05 DIAGNOSIS — I480 Paroxysmal atrial fibrillation: Secondary | ICD-10-CM | POA: Diagnosis not present

## 2018-04-06 DIAGNOSIS — G309 Alzheimer's disease, unspecified: Secondary | ICD-10-CM | POA: Diagnosis not present

## 2018-04-06 DIAGNOSIS — I130 Hypertensive heart and chronic kidney disease with heart failure and stage 1 through stage 4 chronic kidney disease, or unspecified chronic kidney disease: Secondary | ICD-10-CM | POA: Diagnosis not present

## 2018-04-06 DIAGNOSIS — I480 Paroxysmal atrial fibrillation: Secondary | ICD-10-CM | POA: Diagnosis not present

## 2018-04-06 DIAGNOSIS — I2510 Atherosclerotic heart disease of native coronary artery without angina pectoris: Secondary | ICD-10-CM | POA: Diagnosis not present

## 2018-04-06 DIAGNOSIS — N183 Chronic kidney disease, stage 3 (moderate): Secondary | ICD-10-CM | POA: Diagnosis not present

## 2018-04-06 DIAGNOSIS — I5043 Acute on chronic combined systolic (congestive) and diastolic (congestive) heart failure: Secondary | ICD-10-CM | POA: Diagnosis not present

## 2018-04-13 DIAGNOSIS — J8410 Pulmonary fibrosis, unspecified: Secondary | ICD-10-CM | POA: Diagnosis not present

## 2018-04-13 DIAGNOSIS — F0390 Unspecified dementia without behavioral disturbance: Secondary | ICD-10-CM | POA: Diagnosis not present

## 2018-04-13 DIAGNOSIS — I10 Essential (primary) hypertension: Secondary | ICD-10-CM | POA: Diagnosis not present

## 2018-04-13 DIAGNOSIS — Z299 Encounter for prophylactic measures, unspecified: Secondary | ICD-10-CM | POA: Diagnosis not present

## 2018-04-17 ENCOUNTER — Other Ambulatory Visit: Payer: Self-pay | Admitting: *Deleted

## 2018-04-17 MED ORDER — RIVAROXABAN 15 MG PO TABS: ORAL_TABLET | ORAL | 6 refills | Status: AC

## 2018-04-20 NOTE — Progress Notes (Signed)
Cardiology Office Note  Date: 04/21/2018   ID: GAL FELDHAUS, DOB 06/14/1932, MRN [id number]  PCP: Glenda Chroman, MD  Primary Cardiologist: Rozann Lesches, MD   Chief Complaint  Patient presents with  . Atrial Fibrillation    History of Present Illness: Alejandro [name] is an 84 y.o. male last seen in September.  He is here with his wife for a follow-up visit.  He does not report any frank syncopal episodes or palpitations, still is prone to orthostatic dizziness.  I reviewed his medications.  He was prescribed Lasix 20 mg to be taken for weight gain of 2 to 3 pounds in 24 hours by Dr. Woody Seller.  He has not had to use any diuretics however.  His weight is stable.  Recent visit with Dr. Rayann Heman noted in early November, I reviewed the note.  His current cardiac regimen includes midodrine, Lopressor, and Xarelto.  Does not report any bleeding problems.  I did review his lab work from October.  Most recent echocardiogram in September revealed LVEF approximately 35% with diffuse hypokinesis and moderate pulmonary hypertension.  Optimal medical therapy is limited due to intolerances and orthostasis.  Cardiomyopathy is to be managed conservatively, he is not a device candidate.  Past Medical History:  Diagnosis Date  . Alzheimer's dementia ([place])   . CAD (coronary artery disease)    a. 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 Med rx recommended, EF 30-35% at that cath.  . CHF (congestive heart failure) ([place])   . Chronic shortness of breath   . CKD (chronic kidney disease), stage III ([place])   . Essential hypertension   . ILD (interstitial lung disease) ([place]) 01/2018   via CT scan   . Memory loss   . Non-ischemic cardiomyopathy ([place])    EF 35%  . NSVT (nonsustained ventricular  tachycardia) ([name])    Negative EP study  . On home O2    prn during day, continuous qhs  . Orthostatic hypotension   . Paroxysmal atrial fibrillation ([name])   . Secondary cardiomyopathy ([place])    LVEF 40-45% January 2014  . Syncope   . TIA (transient ischemic attack)   . Uses walker     Past Surgical History:  Procedure Laterality Date  . CARDIOVERSION N/A 10/19/2016   Procedure: CARDIOVERSION;  Surgeon: [name], MD;  [facility];  Service: Cardiovascular;  Laterality: N/A;  . CATARACT EXTRACTION    . COLONOSCOPY  unknown  . COLONOSCOPY N/A 09/05/2014   Procedure: COLONOSCOPY;  Surgeon: [name], MD;  [facility];  Service: Endoscopy;  Laterality: N/A;  145pm  . ELECTROPHYSIOLOGY STUDY N/A 08/05/2014   Procedure: ELECTROPHYSIOLOGY STUDY;  Surgeon: Deboraha Sprang, MD;  Location: [facility] CATH LAB;  Service: Cardiovascular;  Laterality: N/A;  . LAPAROSCOPIC APPENDECTOMY  November 2015   Dr. [name]  . LEFT HEART CATHETERIZATION WITH CORONARY ANGIOGRAM N/A 07/31/2014   Procedure: LEFT HEART CATHETERIZATION WITH CORONARY ANGIOGRAM;  Surgeon: [name], MD;  Location: [facility] CATH LAB;  Service: Cardiovascular;  Laterality: N/A;  . LOOP RECORDER IMPLANT    . LOOP RECORDER REMOVAL  11/09/2017   MDT LINQ loop recorder removed for end of battery life of the device by Dr Rayann Heman    Current Outpatient Medications  Medication Sig Dispense Refill  . acetaminophen (TYLENOL)  500 MG tablet Take 1,000-1,500 mg by mouth every 6 (six) hours as needed for mild pain or moderate pain.    . Cholecalciferol (VITAMIN D3) 50 MCG (2000 UT) capsule Take 2,000 Units by mouth daily.    . furosemide (LASIX) 20 MG tablet Take 20 mg by mouth as needed (for weight gain of 2-3 lbs in 24 hours).    Marland Kitchen levothyroxine (SYNTHROID, LEVOTHROID) 25 MCG tablet Take 25 mcg by mouth daily before breakfast.    . metoprolol tartrate (LOPRESSOR) 25 MG tablet Take 0.5 tablets (12.5 mg total) by  mouth 2 (two) times daily. 30 tablet 0  . midodrine (PROAMATINE) 5 MG tablet Take 1 tablet (5 mg total) by mouth 3 (three) times daily with meals. (Patient taking differently: Take 10 mg by mouth 3 (three) times daily with meals. ) 90 tablet 1  . Multiple Vitamins-Minerals (MULTIVITAMIN MEN 50+ PO) Take 1 tablet by mouth daily.    . Rivaroxaban (XARELTO) 15 MG TABS tablet TAKE 1 TABLET BY MOUTH DAILY WITH SUPPER. 30 tablet 6   No current facility-administered medications for this visit.    Allergies:  Patient has no known allergies.   Social History: The patient  reports that he has never smoked. He has never used smokeless tobacco. He reports that he does not drink alcohol or use drugs.   ROS:  Please see the history of present illness. Otherwise, complete review of systems is positive for hearing and memory loss.  All other systems are reviewed and negative.   Physical Exam: VS:  BP 100/68   Pulse 67   Ht 5\' 8"  (1.727 m)   Wt 171 lb 3.2 oz (77.7 kg)   SpO2 96%   BMI 26.03 kg/m , BMI Body mass index is 26.03 kg/m.  Wt Readings from Last 3 Encounters:  04/21/18 171 lb 3.2 oz (77.7 kg)  03/31/18 173 lb (78.5 kg)  03/21/18 175 lb 0.7 oz (79.4 kg)    General: Elderly male, appears comfortable at rest.  Wearing oxygen via nasal cannula HEENT: Conjunctiva and lids normal, oropharynx clear. Neck: Supple, no elevated JVP or carotid bruits, no thyromegaly. Lungs: Clear to auscultation, nonlabored breathing at rest. Cardiac: Irregularly irregular, no S3, 2/6 systolic murmur. Abdomen: Soft, nontender, bowel sounds present. Extremities: No pitting edema, wearing compression hose. Skin: Warm and dry. Musculoskeletal: No kyphosis. Neuropsychiatric: Alert and oriented x3, affect grossly appropriate.  ECG: I personally reviewed the tracing from 03/17/2018 which showed atrial fibrillation with IVCD, repolarization normalities, intermittent aberrantly conducted complexes.  Recent  Labwork: 01/31/2018: ALT 10; AST 23 03/15/2018: B Natriuretic Peptide 1,477.0; TSH 2.052 03/20/2018: Hemoglobin 12.4; Platelets 253 03/21/2018: BUN 21; Creatinine, Ser 1.10; Magnesium 2.0; Potassium 4.0; Sodium 137   Other Studies Reviewed Today:  Echocardiogram 01/31/2018: Study Conclusions  - Left ventricle: The cavity size was normal. Wall thickness was increased in a pattern of mild LVH. The estimated ejection fraction was 35%. Diffuse hypokinesis. There is hypokinesis of the basalinferolateral myocardium. The study was not technically sufficient to allow evaluation of LV diastolic dysfunction due to atrial fibrillation. - Aortic valve: Mildly calcified annulus. Trileaflet; mildly calcified leaflets. There was trivial regurgitation. - Mitral valve: There was mild regurgitation. - Left atrium: The atrium was mildly dilated. - Right atrium: Central venous pressure (est): 3 mm Hg. - Atrial septum: No defect or patent foramen ovale was identified. - Tricuspid valve: There was trivial regurgitation. - Pulmonary arteries: Systolic pressure was moderately increased. PA peak pressure: 50 mm Hg (S). -  Pericardium, extracardiac: There was no pericardial effusion.  Assessment and Plan:  1.  Permanent atrial fibrillation.  Continue strategy of heart rate control and anticoagulation.  He was taken off amiodarone with concerns about toxicity.  Presently he is on Lopressor and Xarelto.  2.  Nonischemic cardiomyopathy with LVEF approximately 35%.  Medical therapy is not optimal in the setting of previous medication intolerances and also orthostasis.  Lasix is to be used as needed for weight gain of 2 to 3 pounds in 24 hours.  3.  Orthostatic hypotension.  He remains on midodrine and has compression stockings.  4.  History of syncope without recent recurrence.  ILR did not demonstrate any specific etiology and this has been explanted due to battery at end-of-life.  Current  medicines were reviewed with the patient today.  Disposition: Follow-up in 3 months.  Signed, Satira Sark, MD, [facility] 04/21/2018 9:48 AM    [facility] at [facility] [address], [place], [place] [address] Phone: [phone]; Fax: [phone]

## 2018-04-21 ENCOUNTER — Ambulatory Visit (INDEPENDENT_AMBULATORY_CARE_PROVIDER_SITE_OTHER): Payer: Medicare Other | Admitting: Cardiology

## 2018-04-21 ENCOUNTER — Encounter: Payer: Self-pay | Admitting: Cardiology

## 2018-04-21 VITALS — BP 100/68 | HR 67 | Ht 68.0 in | Wt 171.2 lb

## 2018-04-21 DIAGNOSIS — I639 Cerebral infarction, unspecified: Secondary | ICD-10-CM

## 2018-04-21 DIAGNOSIS — I4821 Permanent atrial fibrillation: Secondary | ICD-10-CM

## 2018-04-21 DIAGNOSIS — I951 Orthostatic hypotension: Secondary | ICD-10-CM | POA: Diagnosis not present

## 2018-04-21 DIAGNOSIS — I428 Other cardiomyopathies: Secondary | ICD-10-CM | POA: Diagnosis not present

## 2018-04-21 DIAGNOSIS — Z87898 Personal history of other specified conditions: Secondary | ICD-10-CM | POA: Diagnosis not present

## 2018-04-21 NOTE — Patient Instructions (Addendum)
Medication Instructions:   Your physician recommends that you continue on your current medications as directed. Please refer to the Current Medication list given to you today.  Labwork:  NONE  Testing/Procedures:  NONE  Follow-Up:  Your physician recommends that you schedule a follow-up appointment in: 3 months.  Any Other Special Instructions Will Be Listed Below (If Applicable).  If you need a refill on your cardiac medications before your next appointment, please call your pharmacy. 

## 2018-05-01 ENCOUNTER — Other Ambulatory Visit: Payer: Self-pay

## 2018-05-01 ENCOUNTER — Encounter (HOSPITAL_COMMUNITY): Payer: Self-pay

## 2018-05-01 ENCOUNTER — Emergency Department (HOSPITAL_COMMUNITY): Payer: Medicare Other

## 2018-05-01 DIAGNOSIS — E785 Hyperlipidemia, unspecified: Secondary | ICD-10-CM | POA: Diagnosis present

## 2018-05-01 DIAGNOSIS — J849 Interstitial pulmonary disease, unspecified: Secondary | ICD-10-CM | POA: Diagnosis not present

## 2018-05-01 DIAGNOSIS — R0902 Hypoxemia: Secondary | ICD-10-CM | POA: Diagnosis not present

## 2018-05-01 DIAGNOSIS — I21A1 Myocardial infarction type 2: Secondary | ICD-10-CM | POA: Diagnosis present

## 2018-05-01 DIAGNOSIS — I130 Hypertensive heart and chronic kidney disease with heart failure and stage 1 through stage 4 chronic kidney disease, or unspecified chronic kidney disease: Secondary | ICD-10-CM | POA: Diagnosis not present

## 2018-05-01 DIAGNOSIS — I420 Dilated cardiomyopathy: Secondary | ICD-10-CM | POA: Diagnosis present

## 2018-05-01 DIAGNOSIS — I5021 Acute systolic (congestive) heart failure: Secondary | ICD-10-CM

## 2018-05-01 DIAGNOSIS — K219 Gastro-esophageal reflux disease without esophagitis: Secondary | ICD-10-CM | POA: Diagnosis present

## 2018-05-01 DIAGNOSIS — R0689 Other abnormalities of breathing: Secondary | ICD-10-CM

## 2018-05-01 DIAGNOSIS — Z515 Encounter for palliative care: Secondary | ICD-10-CM

## 2018-05-01 DIAGNOSIS — R402434 Glasgow coma scale score 3-8, 24 hours or more after hospital admission: Secondary | ICD-10-CM | POA: Diagnosis not present

## 2018-05-01 DIAGNOSIS — I272 Pulmonary hypertension, unspecified: Secondary | ICD-10-CM | POA: Diagnosis present

## 2018-05-01 DIAGNOSIS — I428 Other cardiomyopathies: Secondary | ICD-10-CM | POA: Diagnosis not present

## 2018-05-01 DIAGNOSIS — I5023 Acute on chronic systolic (congestive) heart failure: Secondary | ICD-10-CM | POA: Diagnosis present

## 2018-05-01 DIAGNOSIS — Z79890 Hormone replacement therapy (postmenopausal): Secondary | ICD-10-CM

## 2018-05-01 DIAGNOSIS — I951 Orthostatic hypotension: Secondary | ICD-10-CM | POA: Diagnosis present

## 2018-05-01 DIAGNOSIS — Z9981 Dependence on supplemental oxygen: Secondary | ICD-10-CM

## 2018-05-01 DIAGNOSIS — E039 Hypothyroidism, unspecified: Secondary | ICD-10-CM | POA: Diagnosis present

## 2018-05-01 DIAGNOSIS — I214 Non-ST elevation (NSTEMI) myocardial infarction: Secondary | ICD-10-CM

## 2018-05-01 DIAGNOSIS — I509 Heart failure, unspecified: Secondary | ICD-10-CM | POA: Diagnosis not present

## 2018-05-01 DIAGNOSIS — I4819 Other persistent atrial fibrillation: Secondary | ICD-10-CM | POA: Diagnosis present

## 2018-05-01 DIAGNOSIS — Z8249 Family history of ischemic heart disease and other diseases of the circulatory system: Secondary | ICD-10-CM

## 2018-05-01 DIAGNOSIS — I491 Atrial premature depolarization: Secondary | ICD-10-CM | POA: Diagnosis not present

## 2018-05-01 DIAGNOSIS — I469 Cardiac arrest, cause unspecified: Secondary | ICD-10-CM | POA: Diagnosis not present

## 2018-05-01 DIAGNOSIS — N183 Chronic kidney disease, stage 3 unspecified: Secondary | ICD-10-CM | POA: Diagnosis present

## 2018-05-01 DIAGNOSIS — Z7189 Other specified counseling: Secondary | ICD-10-CM

## 2018-05-01 DIAGNOSIS — Z79899 Other long term (current) drug therapy: Secondary | ICD-10-CM

## 2018-05-01 DIAGNOSIS — F0280 Dementia in other diseases classified elsewhere without behavioral disturbance: Secondary | ICD-10-CM | POA: Diagnosis present

## 2018-05-01 DIAGNOSIS — J9620 Acute and chronic respiratory failure, unspecified whether with hypoxia or hypercapnia: Secondary | ICD-10-CM | POA: Diagnosis present

## 2018-05-01 DIAGNOSIS — Z7901 Long term (current) use of anticoagulants: Secondary | ICD-10-CM | POA: Diagnosis not present

## 2018-05-01 DIAGNOSIS — I4891 Unspecified atrial fibrillation: Secondary | ICD-10-CM | POA: Diagnosis present

## 2018-05-01 DIAGNOSIS — I2510 Atherosclerotic heart disease of native coronary artery without angina pectoris: Secondary | ICD-10-CM | POA: Diagnosis present

## 2018-05-01 DIAGNOSIS — E876 Hypokalemia: Secondary | ICD-10-CM

## 2018-05-01 DIAGNOSIS — R0600 Dyspnea, unspecified: Secondary | ICD-10-CM

## 2018-05-01 DIAGNOSIS — Z8673 Personal history of transient ischemic attack (TIA), and cerebral infarction without residual deficits: Secondary | ICD-10-CM

## 2018-05-01 DIAGNOSIS — J9621 Acute and chronic respiratory failure with hypoxia: Secondary | ICD-10-CM | POA: Diagnosis present

## 2018-05-01 DIAGNOSIS — J8410 Pulmonary fibrosis, unspecified: Secondary | ICD-10-CM | POA: Diagnosis not present

## 2018-05-01 DIAGNOSIS — J9601 Acute respiratory failure with hypoxia: Secondary | ICD-10-CM | POA: Diagnosis not present

## 2018-05-01 DIAGNOSIS — R918 Other nonspecific abnormal finding of lung field: Secondary | ICD-10-CM | POA: Diagnosis not present

## 2018-05-01 DIAGNOSIS — I110 Hypertensive heart disease with heart failure: Secondary | ICD-10-CM | POA: Diagnosis not present

## 2018-05-01 DIAGNOSIS — J84112 Idiopathic pulmonary fibrosis: Secondary | ICD-10-CM | POA: Diagnosis present

## 2018-05-01 DIAGNOSIS — G309 Alzheimer's disease, unspecified: Secondary | ICD-10-CM | POA: Diagnosis present

## 2018-05-01 DIAGNOSIS — R0602 Shortness of breath: Secondary | ICD-10-CM | POA: Diagnosis not present

## 2018-05-01 LAB — CBC WITH DIFFERENTIAL/PLATELET
Abs Immature Granulocytes: 0.06 10*3/uL (ref 0.00–0.07)
Basophils Absolute: 0 10*3/uL (ref 0.0–0.1)
Basophils Relative: 0 %
Eosinophils Absolute: 0 10*3/uL (ref 0.0–0.5)
Eosinophils Relative: 0 %
HCT: 35.2 % — ABNORMAL LOW (ref 39.0–52.0)
Hemoglobin: 10.9 g/dL — ABNORMAL LOW (ref 13.0–17.0)
Immature Granulocytes: 1 %
Lymphocytes Relative: 18 %
Lymphs Abs: 2.4 10*3/uL (ref 0.7–4.0)
MCH: 30.3 pg (ref 26.0–34.0)
MCHC: 31 g/dL (ref 30.0–36.0)
MCV: 97.8 fL (ref 80.0–100.0)
Monocytes Absolute: 0.9 10*3/uL (ref 0.1–1.0)
Monocytes Relative: 7 %
NEUTROS ABS: 9.6 10*3/uL — AB (ref 1.7–7.7)
Neutrophils Relative %: 74 %
Platelets: 304 10*3/uL (ref 150–400)
RBC: 3.6 MIL/uL — ABNORMAL LOW (ref 4.22–5.81)
RDW: 15.4 % (ref 11.5–15.5)
WBC: 13 10*3/uL — ABNORMAL HIGH (ref 4.0–10.5)
nRBC: 0 % (ref 0.0–0.2)

## 2018-05-01 LAB — TSH: TSH: 1.642 u[IU]/mL (ref 0.350–4.500)

## 2018-05-01 LAB — BASIC METABOLIC PANEL
ANION GAP: 11 (ref 5–15)
BUN: 22 mg/dL (ref 8–23)
CO2: 20 mmol/L — ABNORMAL LOW (ref 22–32)
Calcium: 8.4 mg/dL — ABNORMAL LOW (ref 8.9–10.3)
Chloride: 108 mmol/L (ref 98–111)
Creatinine, Ser: 1.36 mg/dL — ABNORMAL HIGH (ref 0.61–1.24)
GFR calc Af Amer: 55 mL/min — ABNORMAL LOW (ref >60)
GFR, EST NON AFRICAN AMERICAN: 48 mL/min — AB (ref >60)
Glucose, Bld: 140 mg/dL — ABNORMAL HIGH (ref 70–99)
POTASSIUM: 3.2 mmol/L — AB (ref 3.5–5.1)
Sodium: 139 mmol/L (ref 135–145)

## 2018-05-01 LAB — MAGNESIUM: Magnesium: 2 mg/dL (ref 1.7–2.4)

## 2018-05-01 LAB — BRAIN NATRIURETIC PEPTIDE: B Natriuretic Peptide: 2353 pg/mL — ABNORMAL HIGH (ref 0.0–100.0)

## 2018-05-01 LAB — TROPONIN I: Troponin I: 0.37 ng/mL (ref <0.03)

## 2018-05-01 MED ORDER — PANTOPRAZOLE SODIUM 40 MG PO TBEC
40.0000 mg | DELAYED_RELEASE_TABLET | Freq: Every day | ORAL | Status: DC
  Administered 2018-05-01 – 2018-05-04 (×3): 40 mg via ORAL
  Filled 2018-05-01 (×3): qty 1

## 2018-05-01 MED ORDER — RIVAROXABAN 15 MG PO TABS
15.0000 mg | ORAL_TABLET | Freq: Every day | ORAL | Status: DC
  Administered 2018-05-01 – 2018-05-03 (×3): 15 mg via ORAL
  Filled 2018-05-01 (×5): qty 1

## 2018-05-01 MED ORDER — ONDANSETRON HCL 4 MG/2ML IJ SOLN: 4.0000 mg | Freq: Four times a day (QID) | INTRAMUSCULAR | Status: DC | PRN

## 2018-05-01 MED ORDER — LEVOTHYROXINE SODIUM 25 MCG PO TABS
25.0000 ug | ORAL_TABLET | Freq: Every day | ORAL | Status: DC
  Administered 2018-05-02 – 2018-05-04 (×3): 25 ug via ORAL
  Filled 2018-05-01 (×3): qty 1

## 2018-05-01 MED ORDER — SODIUM CHLORIDE 0.9% FLUSH: 3.0000 mL | INTRAVENOUS | Status: DC | PRN

## 2018-05-01 MED ORDER — SODIUM CHLORIDE 0.9% FLUSH
3.0000 mL | Freq: Two times a day (BID) | INTRAVENOUS | Status: DC
  Administered 2018-05-01 – 2018-05-04 (×7): 3 mL via INTRAVENOUS

## 2018-05-01 MED ORDER — POTASSIUM CHLORIDE CRYS ER 20 MEQ PO TBCR
20.0000 meq | EXTENDED_RELEASE_TABLET | Freq: Every day | ORAL | Status: DC
  Administered 2018-05-01 – 2018-05-04 (×3): 20 meq via ORAL
  Filled 2018-05-01 (×3): qty 1

## 2018-05-01 MED ORDER — SODIUM CHLORIDE 0.9 % IV SOLN: 250.0000 mL | INTRAVENOUS | Status: DC | PRN

## 2018-05-01 MED ORDER — ACETAMINOPHEN 325 MG PO TABS
650.0000 mg | ORAL_TABLET | ORAL | Status: DC | PRN
  Administered 2018-05-03 – 2018-05-04 (×2): 650 mg via ORAL
  Filled 2018-05-01 (×2): qty 2

## 2018-05-01 MED ORDER — METOPROLOL TARTRATE 25 MG PO TABS
12.5000 mg | ORAL_TABLET | Freq: Two times a day (BID) | ORAL | Status: DC
  Administered 2018-05-01 – 2018-05-04 (×6): 12.5 mg via ORAL
  Filled 2018-05-01 (×6): qty 1

## 2018-05-01 MED ORDER — ORAL CARE MOUTH RINSE
15.0000 mL | Freq: Two times a day (BID) | OROMUCOSAL | Status: DC
  Administered 2018-05-01 – 2018-05-04 (×3): 15 mL via OROMUCOSAL

## 2018-05-01 MED ORDER — ASPIRIN EC 81 MG PO TBEC
81.0000 mg | DELAYED_RELEASE_TABLET | Freq: Every day | ORAL | Status: DC
  Administered 2018-05-01 – 2018-05-02 (×2): 81 mg via ORAL
  Filled 2018-05-01 (×2): qty 1

## 2018-05-01 MED ORDER — FUROSEMIDE 10 MG/ML IJ SOLN
40.0000 mg | Freq: Once | INTRAMUSCULAR | Status: AC
  Administered 2018-05-01: 40 mg via INTRAVENOUS
  Filled 2018-05-01: qty 4

## 2018-05-01 MED ORDER — MIDODRINE HCL 5 MG PO TABS
10.0000 mg | ORAL_TABLET | Freq: Three times a day (TID) | ORAL | Status: DC
  Administered 2018-05-01 – 2018-05-04 (×9): 10 mg via ORAL
  Filled 2018-05-01 (×9): qty 2

## 2018-05-01 MED ORDER — ACETAMINOPHEN 325 MG PO TABS: 650.0000 mg | ORAL_TABLET | Freq: Four times a day (QID) | ORAL | Status: DC | PRN

## 2018-05-01 MED ORDER — FUROSEMIDE 10 MG/ML IJ SOLN
20.0000 mg | Freq: Two times a day (BID) | INTRAMUSCULAR | Status: DC
  Administered 2018-05-01 – 2018-05-04 (×6): 20 mg via INTRAVENOUS
  Filled 2018-05-01 (×6): qty 2

## 2018-05-01 MED ORDER — ASPIRIN 81 MG PO CHEW
324.0000 mg | CHEWABLE_TABLET | Freq: Once | ORAL | Status: AC
  Administered 2018-05-01: 324 mg via ORAL
  Filled 2018-05-01: qty 4

## 2018-05-01 MED ORDER — LIVING BETTER WITH HEART FAILURE BOOK
Freq: Once | Status: AC
  Administered 2018-05-02: 09:00:00

## 2018-05-01 NOTE — H&P (Signed)
History and Physical    CAMAURI CRATON OAC:[id number] DOB: Sep 20, 1932 DOA: 05/06/2018  Referring MD/NP/PA: Dr. Reather Converse PCP: Glenda Chroman, MD  Patient coming from: home  Chief Complaint: SOB  HPI: Alejandro [name] is a 84 y.o. male with PMH significant for dementia, systolic CHF, non obstructive CAD, stage 3 CKD, atrial fibrillation on xarelto,HTN and chronic resp failure due to IPF; who presented to ED complaining of SOB and lips cyanosis. Patient is confused at baseline and hx mainly provided by his wife. She reported that patient has had increase SOB for the last 3-4 days and has experienced DOE. She noticed even some purplish changes on his lips. O2 sat was checked at home by wife and was in the 50%; despite increase in supplementation his O2 sat remains in the mid 80's%  Patient himself denies CP, palpitations, fever, chills,nausea, vomiting, cough, dysuria, hematuria, melena or hematochezia.   In the ED he was found to be in acute resp distress and on A. Fib with mild RVR. BP was soft (but he chronically has low BP); elevated BNP, vascular congestion and alveolar edema on CXR; patient also had troponin of 0.37   Past Medical/Surgical History: Past Medical History:  Diagnosis Date  . Alzheimer's dementia ([place])   . CAD (coronary artery disease)    a. 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 Med rx recommended, EF 30-35% at that cath.  . CHF (congestive heart failure) ([place])   . CKD (chronic kidney disease), stage III ([place])   . Essential hypertension   . ILD (interstitial lung disease) ([place])   . Memory loss   . Non-ischemic cardiomyopathy ([place])    EF 35%  . NSVT (nonsustained ventricular tachycardia) ([name])    Negative EP study  . On home O2    PRN during day, continuous QHS  .  Orthostatic hypotension   . Paroxysmal atrial fibrillation ([name])   . Secondary cardiomyopathy ([place])    LVEF 40-45% January 2014  . Syncope   . TIA (transient ischemic attack)   . Uses walker     Past Surgical History:  Procedure Laterality Date  . CARDIOVERSION N/A 10/19/2016   Procedure: CARDIOVERSION;  Surgeon: [name], MD;  [facility];  Service: Cardiovascular;  Laterality: N/A;  . CATARACT EXTRACTION    . COLONOSCOPY  unknown  . COLONOSCOPY N/A 09/05/2014   Procedure: COLONOSCOPY;  Surgeon: [name], MD;  [facility];  Service: Endoscopy;  Laterality: N/A;  145pm  . ELECTROPHYSIOLOGY STUDY N/A 08/05/2014   Procedure: ELECTROPHYSIOLOGY STUDY;  Surgeon: Deboraha Sprang, MD;  Location: [facility] CATH LAB;  Service: Cardiovascular;  Laterality: N/A;  . LAPAROSCOPIC APPENDECTOMY  November 2015   Dr. [name]  . LEFT HEART CATHETERIZATION WITH CORONARY ANGIOGRAM N/A 07/31/2014   Procedure: LEFT HEART CATHETERIZATION WITH CORONARY ANGIOGRAM;  Surgeon: [name], MD;  Location: [facility] [facility] CATH LAB;  Service: Cardiovascular;  Laterality: N/A;  . LOOP RECORDER IMPLANT    . LOOP RECORDER REMOVAL  11/09/2017   MDT LINQ loop recorder removed for end of battery life of the device by Dr Rayann Heman    Social History:  reports that he has never smoked. He has never used smokeless tobacco. He reports that he does not drink alcohol or use drugs.  Allergies: No Known Allergies  Family History:  Family History  Problem Relation Age of Onset  . Heart disease Father        Diagnosed in his 46s  . Arthritis Father   . Alzheimer's disease Mother     Prior to Admission medications   Medication Sig Start Date End Date Taking? Authorizing Provider  acetaminophen (TYLENOL) 500 MG tablet Take 1,000-1,500 mg by mouth every 6 (six) hours as needed for mild pain or moderate pain.   Yes [provider]  Cholecalciferol (VITAMIN D3) 50 MCG (2000 UT) capsule Take 2,000  Units by mouth daily.   Yes [provider]  furosemide (LASIX) 20 MG tablet Take 20 mg by mouth as needed (for weight gain of 2-3 lbs in 24 hours).   Yes [provider]  levothyroxine (SYNTHROID, LEVOTHROID) 25 MCG tablet Take 25 mcg by mouth daily before breakfast.   Yes [provider]  metoprolol tartrate (LOPRESSOR) 25 MG tablet Take 0.5 tablets (12.5 mg total) by mouth 2 (two) times daily. 03/21/18 04/22/19 Yes Erline Hau, MD  midodrine (PROAMATINE) 5 MG tablet Take 1 tablet (5 mg total) by mouth 3 (three) times daily with meals. Patient taking differently: Take 10 mg by mouth 3 (three) times daily with meals.  03/28/18  Yes Satira Sark, MD  Multiple Vitamins-Minerals (MULTIVITAMIN MEN 50+ PO) Take 1 tablet by mouth daily.   Yes [provider]  Rivaroxaban (XARELTO) 15 MG TABS tablet TAKE 1 TABLET BY MOUTH DAILY WITH SUPPER. Patient taking differently: Take 15 mg by mouth daily with supper. TAKE 1 TABLET BY MOUTH DAILY WITH SUPPER. 04/17/18  Yes Satira Sark, MD    Review of Systems:  Patient reported some SOB, orthopnea and DOE; other wise no complaints except as mentioned on HPI. Keep in mind that level 5 caveat appliy in the setting of dementia.  Physical Exam: Vitals:   05/11/2018 1200 05/11/2018 1230 05/13/2018 1300 05/16/2018 1330  BP: 102/75 109/78 106/65 102/73  Pulse: (!) 46 60 (!) 55 94  Resp: (!) 23   (!) 24  Temp:      TempSrc:      SpO2: 95% 90% 94% 90%  Weight:      Height:       Constitutional: afebrile, mild resp distress and requiring 3-4 L [place]. No CP. Eyes: PERRL, lids and conjunctivae normal, no icterus, no nystagmus. ENMT: Mucous membranes are moist. Posterior pharynx clear of any exudate or lesions. Neck: normal, supple, no masses, no thyromegaly, positive JVD. Respiratory: mild use of accessory muscles, difficulties speaking in full sentences; positive crackles on exam, positive  rhonchi. Cardiovascular: irrregular, positive SEM, no rubs, no gallops. No extremity edema. 2+ pedal pulses. No carotid bruits.  Abdomen: no tenderness, no masses palpated. No hepatosplenomegaly. Bowel sounds positive.  Musculoskeletal: no clubbing / cyanosis. No joint deformity upper and lower extremities. Good ROM, no contractures. Normal muscle tone.  Skin: no rashes, lesions, ulcers. No induration Neurologic: CN 2-12 grossly intact. Strength 3/5 in all 4 due to poor effort.  Psychiatric: Normal mood; oriented 2 and his insight appears impair by hx of dementia.  Labs on Admission: I have personally reviewed the following labs and imaging studies  CBC: Recent Labs  Lab 04/30/2018 1022  WBC 13.0*  NEUTROABS 9.6*  HGB 10.9*  HCT 35.2*  MCV 97.8  PLT 732   Basic Metabolic Panel: Recent Labs  Lab 05/10/2018 1022  NA 139  K 3.2*  CL 108  CO2 20*  GLUCOSE 140*  BUN 22  CREATININE 1.36*  CALCIUM 8.4*   GFR: Estimated Creatinine Clearance: 39.8 mL/min (A) (by C-G formula based on SCr of 1.36 mg/dL (H)).  Coagulation Profile: No results for input(s): INR, PROTIME in the last 168 hours.   Cardiac Enzymes: Recent Labs  Lab 05/19/2018 1022  TROPONINI 0.37*   BNP  2353  Urine analysis:    Component Value Date/Time   COLORURINE YELLOW 12/26/2014 0352   APPEARANCEUR CLEAR 12/26/2014 0352   LABSPEC 1.025 12/26/2014 0352   PHURINE 6.0 12/26/2014 0352   GLUCOSEU NEGATIVE 12/26/2014 0352   HGBUR NEGATIVE 12/26/2014 0352   BILIRUBINUR NEGATIVE 12/26/2014 0352   KETONESUR NEGATIVE 12/26/2014 0352   PROTEINUR NEGATIVE 12/26/2014 0352   UROBILINOGEN 0.2 12/26/2014 0352   NITRITE NEGATIVE 12/26/2014 0352   LEUKOCYTESUR SMALL (A) 12/26/2014 0352   Radiological Exams on Admission: Dg Chest Portable 1 View  Result Date: 05/21/2018 CLINICAL DATA:  Shortness of breath and cyanosis EXAM: PORTABLE CHEST 1 VIEW COMPARISON:  03/15/2018 FINDINGS: Cardiac shadow is enlarged but stable.  Increased pulmonary vascular congestion is noted with mild alveolar edema bilaterally. No focal confluent infiltrate or sizable effusion is noted. No bony abnormality is seen. IMPRESSION: Changes consistent with CHF. Electronically Signed   By: Inez Catalina M.D.   On: 05/18/2018 09:51    EKG: Independently reviewed. Persistent atrial fibrillation.  Assessment/Plan 1-Acute on chronic respiratory failure ([place]): with hypoxia; in the setting of acute on chronic systolic CHF and further progression of his ILD. -admit to stepdown -initiate IV lasix -recent echo with EF 35% -cycle troponin -check daily weight and strict I's and O's -low sodium diet -cardiology consultation. -patient denies CP -continue oxygen supplementation and wean as tolerated   2-Atrial fibrillation with RVR ([name]) -continue xarelto -continue metoprolol -monitor on telemetry  3-HLD (hyperlipidemia) Continue statins  4-CKD (chronic kidney disease) stage 3, GFR 30-59 ml/min ([name]) -appears to be at baseline -will follow renal function trend   5-Hypothyroidism -check TSH -continue synthroid   6-elevated troponin -in the setting of demand ischemia/NSTEMI -no CP -will cycle troponin -treat CHF exacerbation -continue b-blocker, aspirin and statins -cardiology consulted  7-hx of orthostatic Hypotension -continue use of midodrine  8-GERD -continue PPI  9-hypokalemia -will check Mg level -replete potassium -follow electrolytes trend    DVT prophylaxis: xarelto Code Status: Full Family Communication: wife at bedside   Disposition Plan: to be determine Consults called: cardiology and Palliative care Admission status: stepdown, inpatient, LOS > 2 midnights.   Time Spent: 70 minutes   Barton Dubois MD [facility] Pager [phone]  If 7PM-7AM, please contact night-coverage [email or web address] Password [facility]  05/24/2018, 2:55 PM

## 2018-05-01 NOTE — ED Notes (Signed)
CRITICAL VALUE ALERT  Critical Value:  Troponin 0.37  Date & Time Notied:  05/11/2018, 1138  Provider Notified: Dr. Reather Converse  Orders Received/Actions taken: see chart

## 2018-05-01 NOTE — Progress Notes (Signed)
ANTICOAGULATION CONSULT NOTE - Initial Consult  Pharmacy Consult for xarelto Indication: atrial fibrillation  No Known Allergies  Patient Measurements: Height: 5\' 8"  (172.7 cm) Weight: 180 lb (81.6 kg) IBW/kg (Calculated) : 68.4  Vital Signs: Temp: 98.6 F (37 C) (12/02 0936) Temp Source: Oral (12/02 0936) BP: 98/69 (12/02 1030) Pulse Rate: 59 (12/02 1015)  Labs: Recent Labs    05/18/2018 1022  HGB 10.9*  HCT 35.2*  PLT 304  CREATININE 1.36*  TROPONINI 0.37*    Estimated Creatinine Clearance: 39.8 mL/min (A) (by C-G formula based on SCr of 1.36 mg/dL (H)).   Medical History: Past Medical History:  Diagnosis Date  . Alzheimer's dementia ([place])   . CAD (coronary artery disease)    a. 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 Med rx recommended, EF 30-35% at that cath.  . CHF (congestive heart failure) ([place])   . Chronic shortness of breath   . CKD (chronic kidney disease), stage III ([place])   . Essential hypertension   . ILD (interstitial lung disease) ([place]) 01/2018   via CT scan   . Memory loss   . Non-ischemic cardiomyopathy ([place])    EF 35%  . NSVT (nonsustained ventricular tachycardia) ([name])    Negative EP study  . On home O2    prn during day, continuous qhs  . Orthostatic hypotension   . Paroxysmal atrial fibrillation ([name])   . Secondary cardiomyopathy ([place])    LVEF 40-45% January 2014  . Syncope   . TIA (transient ischemic attack)   . Uses walker     Medications:  xarelto 15mg  po daily with supper See med rec  Assessment: Patient presented to ED with shortness of breath. He has history of dementia, CAD, CHF, kidney disease and atrial fibrillation and takes Xarelto for chronic anticoagulation.    Goal of Therapy:  Monitor platelets by  anticoagulation protocol: Yes   Plan:  Continue Xarelto 15mg  po daily Monitor for S/S of bleeding  Isac Sarna, BS [place], BCPS Clinical Pharmacist Pager [phone] 05/22/2018,11:59 AM

## 2018-05-01 NOTE — Consult Note (Signed)
Cardiology Consultation:   Patient ID: BLAIN HUNSUCKER; [id number]; [birth year]   Admit date: 05/05/2018 Date of Consult: 05/11/2018  Primary Care Provider: Glenda Chroman, MD Primary Cardiologist: Dr. Satira Sark   Patient Profile:   ZUHAIR LARICCIA is an 84 y.o. male with a history of nonischemic cardiomyopathy, nonobstructive CAD as of 2016, CKD stage 3, orthostatic hypotension, paroxysmal to persistent atrial fibrillation, advancing dementia, and interstitial lung disease with chronic hypoxic respiratory failure who is being seen today for the evaluation of increasing shortness of breath and hypoxia at the request of Dr. Dyann Kief.  History of Present Illness:   Mr. Gutridge presents to the ER via EMS with his wife.  She states that he has not had any significant weight gain since he was last seen in the office in late November.  He has been using oxygen intermittently, consistently at nighttime, but in general more frequently during the daytime in the last week.  He has had no cough, no leg swelling, no chest congestion.  Last night the patient was apparently calling out, confused, was not keeping his oxygen on via nasal cannula despite his wife's best efforts.  She states that she checked his oxygen saturation and finding it to be "50%" and that this did not improve much past the low 80s despite increasing his supplemental oxygen.  He does not report any chest pain, no sense of palpitations.  He has not yet had his morning medications other than Synthroid.  Cardiac regimen includes renally dosed Xarelto, Lopressor, ProAmatine, and as needed Lasix which has not been given recently.  Chest x-ray reports creased pulmonary vascular congestion and alveolar edema.  BNP is elevated at 2353 with initial troponin I 0.37.  Past Medical History:  Diagnosis Date  . Alzheimer's dementia ([place])   . CAD (coronary artery disease)    a. cardiac cath in 07/2014 showed aneurysmal right coronary artery with  sluggish flow and possible chronic layered thrombus distally, borderline significant ostial left circumflex stenosis, otherwise no evidence of obstructive coronary artery disease. Med rx recommended, EF 30-35% at that cath.  . CHF (congestive heart failure) ([place])   . CKD (chronic kidney disease), stage III ([place])   . Essential hypertension   . ILD (interstitial lung disease) ([place])   . Memory loss   . Non-ischemic cardiomyopathy ([place])    EF 35%  . NSVT (nonsustained ventricular tachycardia) ([name])    Negative EP study  . On home O2    PRN during day, continuous QHS  . Orthostatic hypotension   . Paroxysmal atrial fibrillation ([name])   . Secondary cardiomyopathy ([place])    LVEF 40-45% January 2014  . Syncope   . TIA (transient ischemic attack)   . Uses walker     Past Surgical History:  Procedure Laterality Date  . CARDIOVERSION N/A 10/19/2016   Procedure: CARDIOVERSION;  Surgeon: [name], MD;  Location: [facility];  Service: Cardiovascular;  Laterality: N/A;  . CATARACT EXTRACTION    . COLONOSCOPY  unknown  . COLONOSCOPY N/A 09/05/2014   Procedure: COLONOSCOPY;  Surgeon: [name], MD;  Location: [facility];  Service: Endoscopy;  Laterality: N/A;  145pm  . ELECTROPHYSIOLOGY STUDY N/A 08/05/2014   Procedure: ELECTROPHYSIOLOGY STUDY;  Surgeon: Deboraha Sprang, MD;  Location: [facility] CATH LAB;  Service: Cardiovascular;  Laterality: N/A;  . LAPAROSCOPIC APPENDECTOMY  November 2015   Dr. [name]  . LEFT HEART CATHETERIZATION WITH CORONARY ANGIOGRAM N/A 07/31/2014   Procedure:  LEFT HEART CATHETERIZATION WITH CORONARY ANGIOGRAM;  Surgeon: [name], MD;  Location: [facility] CATH LAB;  Service: Cardiovascular;  Laterality: N/A;  . LOOP RECORDER IMPLANT    . LOOP RECORDER REMOVAL  11/09/2017   MDT LINQ loop recorder removed for end of battery life of the device by Dr Rayann Heman     Home Medications: No current facility-administered medications on file prior to encounter.    Current  Outpatient Medications on File Prior to Encounter  Medication Sig Dispense Refill  . acetaminophen (TYLENOL) 500 MG tablet Take 1,000-1,500 mg by mouth every 6 (six) hours as needed for mild pain or moderate pain.    . Cholecalciferol (VITAMIN D3) 50 MCG (2000 UT) capsule Take 2,000 Units by mouth daily.    . furosemide (LASIX) 20 MG tablet Take 20 mg by mouth as needed (for weight gain of 2-3 lbs in 24 hours).    Marland Kitchen levothyroxine (SYNTHROID, LEVOTHROID) 25 MCG tablet Take 25 mcg by mouth daily before breakfast.    . metoprolol tartrate (LOPRESSOR) 25 MG tablet Take 0.5 tablets (12.5 mg total) by mouth 2 (two) times daily. 30 tablet 0  . midodrine (PROAMATINE) 5 MG tablet Take 1 tablet (5 mg total) by mouth 3 (three) times daily with meals. (Patient taking differently: Take 10 mg by mouth 3 (three) times daily with meals. ) 90 tablet 1  . Multiple Vitamins-Minerals (MULTIVITAMIN MEN 50+ PO) Take 1 tablet by mouth daily.    . Rivaroxaban (XARELTO) 15 MG TABS tablet TAKE 1 TABLET BY MOUTH DAILY WITH SUPPER. (Patient taking differently: Take 15 mg by mouth daily with supper. TAKE 1 TABLET BY MOUTH DAILY WITH SUPPER.) 30 tablet 6    Allergies:   No Known Allergies  Social History:   Social History   Socioeconomic History  . Marital status: Married    Spouse name: Not on file  . Number of children: Not on file  . Years of education: 40  . Highest education level: Not on file  Occupational History  . Occupation: Retired  Scientific laboratory technician  . Financial resource strain: Not on file  . Food insecurity:    Worry: Not on file    Inability: Not on file  . Transportation needs:    Medical: Not on file    Non-medical: Not on file  Tobacco Use  . Smoking status: Never Smoker  . Smokeless tobacco: Never Used  Substance and Sexual Activity  . Alcohol use: No    Alcohol/week: 0.0 standard drinks  . Drug use: No  . Sexual activity: Not on file  Lifestyle  . Physical activity:    Days per week: Not  on file    Minutes per session: Not on file  . Stress: Not on file  Relationships  . Social connections:    Talks on phone: Not on file    Gets together: Not on file    Attends religious service: Not on file    Active member of club or organization: Not on file    Attends meetings of clubs or organizations: Not on file    Relationship status: Not on file  . Intimate partner violence:    Fear of current or ex partner: Not on file    Emotionally abused: Not on file    Physically abused: Not on file    Forced sexual activity: Not on file  Other Topics Concern  . Not on file  Social History Narrative   Caffeine: 2 drinks a day  Family History:   The patient's family history includes Alzheimer's disease in his mother; Arthritis in his father; Heart disease in his father.  ROS:  Please see the history of present illness.  Increasing trouble with memory, chronic shortness of breath with activity, intermittent confusion, all other ROS reviewed and negative.     Physical Exam/Data:   Vitals:   05/23/2018 0936 05/22/2018 1000 05/10/2018 1015 05/16/2018 1030  BP: 94/71 116/72  98/69  Pulse: (!) 121 66 (!) 59   Resp: (!) 28 (!) 22 17 (!) 30  Temp: 98.6 F (37 C)     TempSrc: Oral     SpO2:  100% 96%   Weight:      Height:       No intake or output data in the 24 hours ending 05/15/2018 1225 Filed Weights   05/13/2018 0933  Weight: 81.6 kg   Body mass index is 27.37 kg/m.   Gen: Disheveled elderly male, chronically ill-appearing and mildly short of breath. HEENT: Conjunctiva and lids normal, oropharynx clear. Neck: Supple, mildly elevated JVP, no carotid bruits, no thyromegaly. Lungs: Coarse breath sounds throughout with scattered crackles at the bases. Cardiac: Irregularly irregular, no S3, 2/6 systolic murmur, no pericardial rub. Abdomen: Soft, nontender, bowel sounds present, no guarding or rebound. Extremities: No pitting edema, distal pulses 2+. Skin: Warm and  dry. Musculoskeletal: No kyphosis. Neuropsychiatric: Alert and oriented x2, affect grossly appropriate.  EKG:  I personally reviewed the tracing from 04/30/2018 which showed atrial fibrillation with RVR, left anterior fascicular block, poor R wave progression, intermittent PVCs versus aberrantly conducted beats.  Telemetry:  I personally reviewed telemetry which shows atrial fibrillation.  Relevant CV Studies:  Echocardiogram 01/31/2018: Study Conclusions  - Left ventricle: The cavity size was normal. Wall thickness was   increased in a pattern of mild LVH. The estimated ejection   fraction was 35%. Diffuse hypokinesis. There is hypokinesis of   the basalinferolateral myocardium. The study was not technically   sufficient to allow evaluation of LV diastolic dysfunction due to   atrial fibrillation. - Aortic valve: Mildly calcified annulus. Trileaflet; mildly   calcified leaflets. There was trivial regurgitation. - Mitral valve: There was mild regurgitation. - Left atrium: The atrium was mildly dilated. - Right atrium: Central venous pressure (est): 3 mm Hg. - Atrial septum: No defect or patent foramen ovale was identified. - Tricuspid valve: There was trivial regurgitation. - Pulmonary arteries: Systolic pressure was moderately increased.   PA peak pressure: 50 mm Hg (S). - Pericardium, extracardiac: There was no pericardial effusion.  Laboratory Data:  Chemistry Recent Labs  Lab 05/06/2018 1022  NA 139  K 3.2*  CL 108  CO2 20*  GLUCOSE 140*  BUN 22  CREATININE 1.36*  CALCIUM 8.4*  GFRNONAA 48*  GFRAA 55*  ANIONGAP 11    No results for input(s): PROT, ALBUMIN, AST, ALT, ALKPHOS, BILITOT in the last 168 hours. Hematology Recent Labs  Lab 05/15/2018 1022  WBC 13.0*  RBC 3.60*  HGB 10.9*  HCT 35.2*  MCV 97.8  MCH 30.3  MCHC 31.0  RDW 15.4  PLT 304   Cardiac Enzymes Recent Labs  Lab 05/23/2018 1022  TROPONINI 0.37*   No results for input(s): TROPIPOC in the  last 168 hours.  BNP Recent Labs  Lab 04/30/2018 1022  BNP 2,353.0*     Radiology/Studies:  Dg Chest Portable 1 View  Result Date: 05/16/2018 CLINICAL DATA:  Shortness of breath and cyanosis EXAM: PORTABLE CHEST 1 VIEW  COMPARISON:  03/15/2018 FINDINGS: Cardiac shadow is enlarged but stable. Increased pulmonary vascular congestion is noted with mild alveolar edema bilaterally. No focal confluent infiltrate or sizable effusion is noted. No bony abnormality is seen. IMPRESSION: Changes consistent with CHF. Electronically Signed   By: Inez Catalina M.D.   On: 05/03/2018 09:51    Assessment and Plan:   1.  Acute on chronic hypoxic respiratory failure in the setting of suspected combination of acute on chronic systolic heart failure and also progressive lung disease.  He has known interstitial lung disease with chronic hypoxic respiratory failure requiring oxygen.  2.  Persistent atrial fibrillation, currently with RVR.  Has not yet received his Lopressor.  Heart rate also likely exacerbated in the setting of acute illness.  He is on renally dosed Xarelto for stroke prophylaxis.  3.  Elevated troponin I, initially 0.37.  Possibly represents a type II NSTEMI related to demand ischemia.  4.  Progressive dementia.  5.  CKD stage 3, current creatinine 1.36.  6.  Orthostatic hypotension, on midodrine.  7.  Nonischemic cardiomyopathy with recent LVEF 35% and diffuse LV hypokinesis with more prominent hypokinesis in the inferolateral wall.  Also moderate pulmonary hypertension which is potentially more associated with his chronic lung disease.  8.  Previously documented nonobstructive CAD by cardiac catheterization in 2016.  Discussed situation with patient and his wife. He is being admitted to the hospitalist service for further evaluation. Would cycle cardiac markers, otherwise continue beta-blocker for control of atrial fibrillation as well as renally dosed Xarelto for stroke prophylaxis.  Place  him on low-dose IV Lasix for a gentle diuresis, but would not be too aggressive with propensity to hypotension.  Overall, he has had a general decline in his health related to chronic comorbidities over the last several months.  I suspect that his chronic lung disease is also worsening as a significant contributor to his current state.  It would be worth considering a palliative care consultation.    Signed, Rozann Lesches, MD  05/26/2018 12:25 PM

## 2018-05-01 NOTE — ED Triage Notes (Signed)
Ems reports pt was sob, cyanotic around lips and moaning when they arrived.  Wife says pt has been moaning for 2 days.  EMS gave 2 albuterol treatments and 125mg  solumedrol.    o2sat was in 70's on 6 liters upon their arrival.  Reports pt is usually on home o2 but pt isn't sure how much.  Pt 86% on 4 liters.  Ems says lungs clear now and cough is productive.

## 2018-05-01 NOTE — ED Provider Notes (Addendum)
[facility] EMERGENCY DEPARTMENT Provider Note   CSN: [id number] Arrival date & time: 05/20/2018  0814     History   Chief Complaint Chief Complaint  Patient presents with  . Shortness of Breath    HPI Alejandro [name] is a 84 y.o. male.  Patient with history of dementia, CAD, CHF, kidney disease, lives at home with his wife per his report, atrial fibrillation on metoprolol and Xarelto presents with worsening shortness of breath and cyanosis of the lips per report.  Patient follows with local cardiologist.  Patient describes worsening shortness of breath past few days he is unable to exert himself and general fatigue.  No fevers or chills.     Past Medical History:  Diagnosis Date  . Alzheimer's dementia ([place])   . CAD (coronary artery disease)    a. 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 Med rx recommended, EF 30-35% at that cath.  . CHF (congestive heart failure) ([place])   . Chronic shortness of breath   . CKD (chronic kidney disease), stage III ([place])   . Essential hypertension   . ILD (interstitial lung disease) ([place]) 01/2018   via CT scan   . Memory loss   . Non-ischemic cardiomyopathy ([place])    EF 35%  . NSVT (nonsustained ventricular tachycardia) ([name])    Negative EP study  . On home O2    prn during day, continuous qhs  . Orthostatic hypotension   . Paroxysmal atrial fibrillation ([name])   . Secondary cardiomyopathy ([place])    LVEF 40-45% January 2014  . Syncope   . TIA (transient ischemic attack)   . Uses walker     Patient Active Problem List   Diagnosis Date Noted  . Acute on chronic respiratory failure with hypoxemia ([place]) 03/15/2018  . IPF (idiopathic pulmonary fibrosis) ([place]) 03/15/2018  . HCAP (healthcare-associated pneumonia) 03/15/2018  . Acute on  chronic systolic HF (heart failure) ([place]) 02/01/2018  . Hypothyroidism 01/31/2018  . CHF (congestive heart failure) ([place]) 01/31/2018  . Acute on chronic combined systolic and diastolic CHF (congestive heart failure) ([place]) 10/14/2016  . AF (paroxysmal atrial fibrillation) ([place]) 10/14/2016  . Acute respiratory failure with hypoxia ([place]) 10/14/2016  . CKD (chronic kidney disease) stage 3, GFR 30-59 ml/min ([name]) 10/14/2016  . Heme positive stool 08/06/2016  . Orthostatic hypotension 12/25/2014  . Right sided weakness 12/25/2014  . HLD (hyperlipidemia) 12/25/2014  . Hypotension 12/25/2014  . AKI (acute kidney injury) ([place]) 12/25/2014  . History of colonic polyps   . Rectal bleeding 09/03/2014  . Chronic anticoagulation 09/03/2014  . Chest tightness 08/13/2014  . Chest pain 08/13/2014  . Dizziness 08/13/2014  . Episodic lightheadedness   . NSVT (nonsustained ventricular tachycardia) ([place]) 08/05/2014  . Congestive dilated cardiomyopathy ([place]) 08/05/2014  . Syncope 08/05/2014  . Atrial fibrillation with RVR ([place]) 07/30/2014  . Secondary cardiomyopathy ([place]) 06/04/2014  . Essential hypertension 05/02/2013    Past Surgical History:  Procedure Laterality Date  . CARDIOVERSION N/A 10/19/2016   Procedure: CARDIOVERSION;  Surgeon: [name], MD;  Location: [facility] ENDO SUITE;  Service: Cardiovascular;  Laterality: N/A;  . CATARACT EXTRACTION    . COLONOSCOPY  unknown  . COLONOSCOPY N/A 09/05/2014   Procedure: COLONOSCOPY;  Surgeon: [name], MD;  Location: [facility] ENDO SUITE;  Service: Endoscopy;  Laterality: N/A;  145pm  . ELECTROPHYSIOLOGY  STUDY N/A 08/05/2014   Procedure: ELECTROPHYSIOLOGY STUDY;  Surgeon: Deboraha Sprang, MD;  Location: [facility] CATH LAB;  Service: Cardiovascular;  Laterality: N/A;  . LAPAROSCOPIC APPENDECTOMY  November 2015   Dr. [name]  . LEFT HEART CATHETERIZATION WITH CORONARY ANGIOGRAM N/A 07/31/2014   Procedure: LEFT HEART CATHETERIZATION WITH CORONARY ANGIOGRAM;   Surgeon: [name], MD;  Location: [facility] CATH LAB;  Service: Cardiovascular;  Laterality: N/A;  . LOOP RECORDER IMPLANT    . LOOP RECORDER REMOVAL  11/09/2017   MDT LINQ loop recorder removed for end of battery life of the device by Dr Rayann Heman        Home Medications    Prior to Admission medications   Medication Sig Start Date End Date Taking? Authorizing Provider  acetaminophen (TYLENOL) 500 MG tablet Take 1,000-1,500 mg by mouth every 6 (six) hours as needed for mild pain or moderate pain.   Yes [provider]  Cholecalciferol (VITAMIN D3) 50 MCG (2000 UT) capsule Take 2,000 Units by mouth daily.   Yes [provider]  furosemide (LASIX) 20 MG tablet Take 20 mg by mouth as needed (for weight gain of 2-3 lbs in 24 hours).   Yes [provider]  levothyroxine (SYNTHROID, LEVOTHROID) 25 MCG tablet Take 25 mcg by mouth daily before breakfast.   Yes [provider]  metoprolol tartrate (LOPRESSOR) 25 MG tablet Take 0.5 tablets (12.5 mg total) by mouth 2 (two) times daily. 03/21/18 04/22/19 Yes Erline Hau, MD  midodrine (PROAMATINE) 5 MG tablet Take 1 tablet (5 mg total) by mouth 3 (three) times daily with meals. Patient taking differently: Take 10 mg by mouth 3 (three) times daily with meals.  03/28/18  Yes Satira Sark, MD  Multiple Vitamins-Minerals (MULTIVITAMIN MEN 50+ PO) Take 1 tablet by mouth daily.   Yes [provider]  Rivaroxaban (XARELTO) 15 MG TABS tablet TAKE 1 TABLET BY MOUTH DAILY WITH SUPPER. Patient taking differently: Take 15 mg by mouth daily with supper. TAKE 1 TABLET BY MOUTH DAILY WITH SUPPER. 04/17/18  Yes Satira Sark, MD    Family History Family History  Problem Relation Age of Onset  . Heart disease Father        Diagnosed in his 86s  . Arthritis Father   . Alzheimer's disease Mother     Social History Social History   Tobacco Use  . Smoking status: Never Smoker  . Smokeless  tobacco: Never Used  Substance Use Topics  . Alcohol use: No    Alcohol/week: 0.0 standard drinks  . Drug use: No     Allergies   Patient has no known allergies.   Review of Systems Review of Systems   Physical Exam Updated Vital Signs BP 98/69   Pulse (!) 59   Temp 98.6 F (37 C) (Oral)   Resp (!) 30   Ht 5\' 8"  (1.727 m)   Wt 81.6 kg   SpO2 96%   BMI 27.37 kg/m   Physical Exam   ED Treatments / Results  Labs (all labs ordered are listed, but only abnormal results are displayed) Labs Reviewed  BASIC METABOLIC PANEL - Abnormal; Notable for the following components:      Result Value   Potassium 3.2 (*)    CO2 20 (*)    Glucose, Bld 140 (*)    Creatinine, Ser 1.36 (*)    Calcium 8.4 (*)    GFR calc non Af Amer 48 (*)  GFR calc Af Amer 55 (*)    All other components within normal limits  CBC WITH DIFFERENTIAL/PLATELET - Abnormal; Notable for the following components:   WBC 13.0 (*)    RBC 3.60 (*)    Hemoglobin 10.9 (*)    HCT 35.2 (*)    Neutro Abs 9.6 (*)    All other components within normal limits  BRAIN NATRIURETIC PEPTIDE - Abnormal; Notable for the following components:   B Natriuretic Peptide 2,353.0 (*)    All other components within normal limits  TROPONIN I - Abnormal; Notable for the following components:   Troponin I 0.37 (*)    All other components within normal limits    EKG EKG Interpretation  Date/Time:  Monday May 01 2018 09:38:06 EST Ventricular Rate:  120 PR Interval:    QRS Duration: 113 QT Interval:  357 QTC Calculation: 475 R Axis:   -60 Text Interpretation:  Atrial fibrillation Left anterior fascicular block Borderline low voltage, extremity leads Abnormal R-wave progression, late transition Borderline repolarization abnormality Confirmed by Elnora Morrison [phone]) on 05/09/2018 10:02:33 AM   Radiology Dg Chest Portable 1 View  Result Date: 05/21/2018 CLINICAL DATA:  Shortness of breath and cyanosis EXAM: PORTABLE  CHEST 1 VIEW COMPARISON:  03/15/2018 FINDINGS: Cardiac shadow is enlarged but stable. Increased pulmonary vascular congestion is noted with mild alveolar edema bilaterally. No focal confluent infiltrate or sizable effusion is noted. No bony abnormality is seen. IMPRESSION: Changes consistent with CHF. Electronically Signed   By: Inez Catalina M.D.   On: 05/20/2018 09:51    Procedures .Critical Care Performed by: Elnora Morrison, MD Authorized by: Elnora Morrison, MD   Critical care provider statement:    Critical care time (minutes):  40   Critical care start time:  05/18/2018 11:00 AM   Critical care end time:  05/21/2018 11:40 AM   Critical care time was exclusive of:  Separately billable procedures and treating other patients and teaching time   Critical care was necessary to treat or prevent imminent or life-threatening deterioration of the following conditions:  Cardiac failure   Critical care was time spent personally by me on the following activities:  Discussions with consultants, examination of patient, ordering and performing treatments and interventions, ordering and review of laboratory studies, ordering and review of radiographic studies, pulse oximetry, re-evaluation of patient's condition, obtaining history from patient or surrogate and review of old charts   (including critical care time)  Medications Ordered in ED Medications  furosemide (LASIX) injection 40 mg (has no administration in time range)  aspirin chewable tablet 324 mg (has no administration in time range)     Initial Impression / Assessment and Plan / ED Course  I have reviewed the triage vital signs and the nursing notes.  Pertinent labs & imaging results that were available during my care of the patient were reviewed by me and considered in my medical decision making (see chart for details).    Patient presents with clinical concern for acute CHF and possible pneumonia.  Patient has crackles at the base,  cardiac history.  Patient has atrial fibrillation with rapid ventricular response EKG reviewed.  Patient has history of A. fib and is on medication.  Plan for blood work, Lasix, nasal cannula and BiPAP if needed. Increased O2 requirement. Currently on 4 L [place] with increased work of breathing, will trial 1 hr bipap to help.  Plan for stepdown admit. HR improved as well.  Pressure  Patient improved to requiring 3  L nasal cannula.  We will hold on BiPAP at this time.  Patient stable for floor/telemetry.  Blood work consistent with heart failure significant elevated BNP, mild acute renal failure, mild hypokalemia.  Paged hospitalist for admission  Cardiology consult, troponin elevated, likely demand and acute on chronic with cardiac hx.  Will trend.  The patients results and plan were reviewed and discussed.   Any x-rays performed were independently reviewed by myself.   Differential diagnosis were considered with the presenting HPI.  Medications  furosemide (LASIX) injection 40 mg (has no administration in time range)  aspirin chewable tablet 324 mg (has no administration in time range)    Vitals:   05/10/2018 0936 05/29/2018 1000 05/30/2018 1015 05/09/2018 1030  BP: 94/71 116/72  98/69  Pulse: (!) 121 66 (!) 59   Resp: (!) 28 (!) 22 17 (!) 30  Temp: 98.6 F (37 C)     TempSrc: Oral     SpO2:  100% 96%   Weight:      Height:        Final diagnoses:  Acute systolic congestive heart failure ([name])  Hypoxia  NSTEMI (non-ST elevated myocardial infarction) [facility])    Admission/ observation were discussed with the admitting physician, patient and/or family and they are comfortable with the plan.     Final Clinical Impressions(s) / ED Diagnoses   Final diagnoses:  Acute systolic congestive heart failure ([place])  Hypoxia  NSTEMI (non-ST elevated myocardial infarction) [facility])    ED Discharge Orders    None       Elnora Morrison, MD 05/02/2018 1129    Elnora Morrison, MD 05/14/2018 [phone]

## 2018-05-01 NOTE — Progress Notes (Signed)
**Note De-Identified  Obfuscation** Patient not placed on BIPAP and this time per Dr. Reather Converse.  RRT to continue to monitor.

## 2018-05-01 NOTE — ED Notes (Signed)
XR at bedside

## 2018-05-01 NOTE — ED Notes (Signed)
Pt given urinal.

## 2018-05-01 NOTE — ED Notes (Signed)
Family at bedside. 

## 2018-05-02 DIAGNOSIS — I214 Non-ST elevation (NSTEMI) myocardial infarction: Secondary | ICD-10-CM

## 2018-05-02 DIAGNOSIS — I4891 Unspecified atrial fibrillation: Secondary | ICD-10-CM

## 2018-05-02 DIAGNOSIS — R0902 Hypoxemia: Secondary | ICD-10-CM

## 2018-05-02 DIAGNOSIS — Z515 Encounter for palliative care: Secondary | ICD-10-CM

## 2018-05-02 DIAGNOSIS — Z7189 Other specified counseling: Secondary | ICD-10-CM

## 2018-05-02 DIAGNOSIS — N183 Chronic kidney disease, stage 3 (moderate): Secondary | ICD-10-CM

## 2018-05-02 DIAGNOSIS — I5021 Acute systolic (congestive) heart failure: Secondary | ICD-10-CM

## 2018-05-02 DIAGNOSIS — I428 Other cardiomyopathies: Secondary | ICD-10-CM

## 2018-05-02 DIAGNOSIS — J849 Interstitial pulmonary disease, unspecified: Secondary | ICD-10-CM

## 2018-05-02 LAB — BASIC METABOLIC PANEL
Anion gap: 10 (ref 5–15)
BUN: 32 mg/dL — AB (ref 8–23)
CO2: 21 mmol/L — ABNORMAL LOW (ref 22–32)
Calcium: 8.5 mg/dL — ABNORMAL LOW (ref 8.9–10.3)
Chloride: 106 mmol/L (ref 98–111)
Creatinine, Ser: 1.31 mg/dL — ABNORMAL HIGH (ref 0.61–1.24)
GFR calc Af Amer: 58 mL/min — ABNORMAL LOW (ref >60)
GFR calc non Af Amer: 50 mL/min — ABNORMAL LOW (ref >60)
Glucose, Bld: 134 mg/dL — ABNORMAL HIGH (ref 70–99)
Potassium: 3.7 mmol/L (ref 3.5–5.1)
Sodium: 137 mmol/L (ref 135–145)

## 2018-05-02 LAB — MRSA PCR SCREENING: MRSA by PCR: NEGATIVE

## 2018-05-02 LAB — TROPONIN I: Troponin I: 0.29 ng/mL (ref <0.03)

## 2018-05-02 MED ORDER — METHYLPREDNISOLONE SODIUM SUCC 40 MG IJ SOLR
40.0000 mg | Freq: Two times a day (BID) | INTRAMUSCULAR | Status: DC
  Administered 2018-05-02 (×2): 40 mg via INTRAVENOUS
  Filled 2018-05-02 (×2): qty 1

## 2018-05-02 NOTE — Care Management (Signed)
CM consulted for CHF. Chart reviewed.  CM following. Will await palliative consult/conversations before assessing patient at bedside.

## 2018-05-02 NOTE — Progress Notes (Signed)
PROGRESS NOTE    Alejandro [name]  QPY:[id number] DOB: [birth year] DOA: 05/22/2018 PCP: Glenda Chroman, MD     Brief Narrative:  84 y.o. male with PMH significant for dementia, systolic CHF, non obstructive CAD, stage 3 CKD, atrial fibrillation on xarelto,HTN and chronic resp failure due to IPF; who presented to ED complaining of SOB and lips cyanosis. Patient is confused at baseline and hx mainly provided by his wife. She reported that patient has had increase SOB for the last 3-4 days and has experienced DOE. She noticed even some purplish changes on his lips. O2 sat was checked at home by wife and was in the 50%; despite increase in supplementation his O2 sat remains in the mid 80's%  Patient himself denies CP, palpitations, fever, chills,nausea, vomiting, cough, dysuria, hematuria, melena or hematochezia.   In the ED he was found to be in acute resp distress and on A. Fib with mild RVR. BP was soft (but he chronically has low BP); elevated BNP, vascular congestion and alveolar edema on CXR; patient also had troponin of 0.37    Assessment & Plan: 1-acute on chronic respiratory failure in the setting of further progression of his ILD and acute systolic heart failure. -Continue current diuretics dose -Patient will be started on steroids to assist with his oxygen demand from a ILD standpoint. -Continue oxygen supplementation and wean down as tolerated -Follow cardiology recommendations -Follow-up: From palliative care goals of care meeting. -Patient denies chest pain -Continue daily weights, strict I's and O's and low-sodium diet. -Continue low-dose of metoprolol -Most recent ejection fraction in September 2019 35%. -Troponin has trended down  2-atrial fibrillation with RVR -Heart rate is slightly better control but is still irregular -Continue metoprolol -Continue Xarelto -Continue telemetry monitoring.  3-hyperlipidemia -continue statins  4-chronic kidney disease -Stage III at  baseline -Has remained stable -Will continue to follow trend while actively diuresing him.  5-hypothyroidism -Continue Synthroid -TSH within normal limits  6-hypokalemia -Repleted and within normal limits currently -Follow electrolytes trend -Magnesium within normal limits.  7-GERD -Continue PPI  8-history of orthostatic hypotension -We will continue the use of Middaugh drain  9-elevated troponin -In the setting of demand ischemia/NSTEMI -Patient denies chest pain -cycling has demonstrated trending down troponin -Not really a candidate for intervention -Continue treatment for heart failure -Continue beta-blocker, aspirin and statins. -Follow cardiology recommendations.  DVT prophylaxis: Xarelto Code Status: Full code Family Communication: No family at bedside Disposition Plan: Remains in a stepdown, continue gentle diuresis, start steroids, follow cardiology further recommendations and outcome from goals of care discussion by palliative care.  Consultants:   Cardiology  Palliative care  Procedures:   Below for x-ray reports  Antimicrobials:  Anti-infectives (From admission, onward)   None       Subjective: Patient has required increased oxygen supplementation, denies chest pain, he is afebrile reports feeling slightly better.  Objective: Vitals:   05/02/18 0731 05/02/18 0800 05/02/18 1127 05/02/18 1627  BP:  90/68    Pulse: 95 84 (!) 121 (!) 108  Resp: 19 19 19  (!) 23  Temp: (!) 97.4 F (36.3 C)  (!) 97.4 F (36.3 C) (!) 97.2 F (36.2 C)  TempSrc: Oral  Oral Oral  SpO2: 94% 92% 93% (!) 88%  Weight:      Height:        Intake/Output Summary (Last 24 hours) at 05/02/2018 1834 Last data filed at 05/02/2018 0500 Gross per 24 hour  Intake -  Output 625 ml  Net -  625 ml   Filed Weights   05/20/2018 0933 05/18/2018 1558 05/02/18 0300  Weight: 81.6 kg 74.9 kg 74.4 kg    Examination General exam: Alert, awake, oriented x 2, poor insight. No CP and  reporting feeling slightly better. Requiring HF [place] supplementation.  No nausea, no vomiting. Respiratory system: Positive crackles at the bases, fair air movement, positive rhonchi right, no wheezing.  Positive tachypnea with minimal exertion. Cardiovascular system: Irregular irregular, positive systolic ejection murmur, no rubs, no gallops.  Patient with mild JVD.   Gastrointestinal system: Abdomen is nondistended, soft and nontender. No organomegaly or masses felt. Normal bowel sounds heard. Central nervous system: Alert and oriented. No focal neurological deficits. Extremities: No cyanosis or clubbing. Skin: No rashes, lesions or ulcers Psychiatry: Judgement appears impair secondary to underlying dementia.  Mood is a stable.  No agitation.   Data Reviewed: I have personally reviewed following labs and imaging studies  CBC: Recent Labs  Lab 05/06/2018 1022  WBC 13.0*  NEUTROABS 9.6*  HGB 10.9*  HCT 35.2*  MCV 97.8  PLT 132   Basic Metabolic Panel: Recent Labs  Lab 05/26/2018 1022 05/02/18 0351  NA 139 137  K 3.2* 3.7  CL 108 106  CO2 20* 21*  GLUCOSE 140* 134*  BUN 22 32*  CREATININE 1.36* 1.31*  CALCIUM 8.4* 8.5*  MG 2.0  --    GFR: Estimated Creatinine Clearance: 41.3 mL/min (A) (by C-G formula based on SCr of 1.31 mg/dL (H)).  Cardiac Enzymes: Recent Labs  Lab 05/27/2018 1022 05/02/18 0351  TROPONINI 0.37* 0.29*   Thyroid Function Tests: Recent Labs    05/25/2018 1022  TSH 1.642   Urine analysis:    Component Value Date/Time   COLORURINE YELLOW 12/26/2014 0352   APPEARANCEUR CLEAR 12/26/2014 0352   LABSPEC 1.025 12/26/2014 0352   PHURINE 6.0 12/26/2014 0352   GLUCOSEU NEGATIVE 12/26/2014 0352   HGBUR NEGATIVE 12/26/2014 0352   BILIRUBINUR NEGATIVE 12/26/2014 0352   KETONESUR NEGATIVE 12/26/2014 0352   PROTEINUR NEGATIVE 12/26/2014 0352   UROBILINOGEN 0.2 12/26/2014 0352   NITRITE NEGATIVE 12/26/2014 0352   LEUKOCYTESUR SMALL (A) 12/26/2014 0352    Recent Results (from the past 240 hour(s))  MRSA PCR Screening     Status: None   Collection Time: 05/03/2018  3:18 PM  Result Value Ref Range Status   MRSA by PCR NEGATIVE NEGATIVE Final    Comment:        The GeneXpert MRSA Assay (FDA approved for NASAL specimens only), is one component of a comprehensive MRSA colonization surveillance program. It is not intended to diagnose MRSA infection nor to guide or monitor treatment for MRSA infections. Performed at [facility], [address], [place], [place] [address]      Radiology Studies: Dg Chest Portable 1 View  Result Date: 05/29/2018 CLINICAL DATA:  Shortness of breath and cyanosis EXAM: PORTABLE CHEST 1 VIEW COMPARISON:  03/15/2018 FINDINGS: Cardiac shadow is enlarged but stable. Increased pulmonary vascular congestion is noted with mild alveolar edema bilaterally. No focal confluent infiltrate or sizable effusion is noted. No bony abnormality is seen. IMPRESSION: Changes consistent with CHF. Electronically Signed   By: Inez Catalina M.D.   On: 05/09/2018 09:51    Scheduled Meds: . furosemide  20 mg Intravenous Q12H  . levothyroxine  25 mcg Oral QAC breakfast  . mouth rinse  15 mL Mouth Rinse BID  . methylPREDNISolone (SOLU-MEDROL) injection  40 mg Intravenous Q12H  . metoprolol tartrate  12.5 mg  Oral BID  . midodrine  10 mg Oral TID WC  . pantoprazole  40 mg Oral Daily  . potassium chloride  20 mEq Oral Daily  . rivaroxaban  15 mg Oral Q supper  . sodium chloride flush  3 mL Intravenous Q12H   Continuous Infusions: . sodium chloride       LOS: 1 day    Time spent: 35 minutes. Greater than 50% of this time was spent in direct contact with the patient, coordinating care and discussing relevant ongoing clinical issues, including discussion about the findings and progression of his interstitial lung disease along with type II NSTEMI.  We have spent some time discussing quality of life, reasoning about resuscitation versus  not resuscitation and also discussion about ongoing treatment with the use of his steroids and diuretics for his increased shortness of breath.    Barton Dubois, MD [facility] Pager [phone]  If 7PM-7AM, please contact night-coverage [email or web address] Password [facility] 05/02/2018, 6:34 PM

## 2018-05-02 NOTE — Progress Notes (Signed)
PMT consult received and chart reviewed. No family at bedside. Attempted to call both contact numbers listed for wife. Unable to leave VM on either number. Instructed RN to page PMT provider when wife arrives to bedside this afternoon. Thank you.   NO CHARGE  Ihor Dow, FNP-C Palliative Medicine Team  Phone: [phone] Fax: [phone]

## 2018-05-02 NOTE — Consult Note (Signed)
Consultation Note Date: 05/02/2018   Patient Name: Alejandro [name]  DOB: 15-Sep-1932  MRN: [id number]  Age / Sex: 84 y.o., male  PCP: Glenda Chroman, MD Referring Physician: Barton Dubois, MD  Reason for Consultation: Establishing goals of care  HPI/Patient Profile: 84 y.o. male  with past medical history of interstitial lung disease on intermittent home oxygen, nonischemic cardiomyopathy, nonobstructive CAD, CKD stage 3, orthostatic hypotension on midodrine, atrial fibrillation on Xarelto, progressive dementia admitted on 05/18/2018 with shortness of breath and lip cyanosis. Per wife, patient with increase in SOB for 3-4 days with oxygen saturation in 50's. Wife increased oxygen but sats remained in mid 3's. In ED, patient found to be in acute respiratory distress, afib RVR, soft BP's (chronic), elevated BNP, vascular congestion and alveolar edema on CXR. Currently requiring HFNC. Cardiology consulted. ECHO in 01/2018 revealed LVEF 35% with diffuse hypokinesis. Palliative medicine consultation for goals of care.   Clinical Assessment and Goals of Care:  I have reviewed medical records, discussed with care team, and met with patient and wife Alejandro [name]) at bedside to discuss diagnosis, [place], EOL wishes, disposition and options.  Patient is awake, alert, oriented and able to participate in [place] discussion. He does have baseline dementia with periods of confusion and repetitive statements.   Introduced Palliative Medicine as specialized medical care for people living with serious illness. It focuses on providing relief from the symptoms and stress of a serious illness. The goal is to improve quality of life for both the patient and the family.  We discussed a brief life review of the patient. Alejandro [name] describes Alejandro [name] as a very "active" individual throughout his life. 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 After retirement, Ishaq continued to "piddle" and ended up building 20 homes! Alejandro [name] and Alejandro [name] explain that these "episodes" (SOB, dyspnea on exertion, weakness) started this past September. He had two previous hospitalizations and was diagnosed with ILD.   Alejandro [name] shares his sadness with decreasing ability to care for himself and do the things he once loved. "I can't do anything anymore." He becomes easily short of breath with minimal exertion. Alejandro [name] and Alejandro [name] continue to focus on the fact that he was "fine" before episodes started in September.   Discussed events leading up to hospitalization and course of hospital diagnoses and interventions. Alejandro [name] and Alejandro [name] have a good understanding that ILD is progressive and irreversible. We discussed worsening episodes and higher oxygen requirement indicating disease progression. We did discuss heart failure component and medical management with this. Also cautiousness with giving high dose lasix with underlying chronic hypotension.   Advanced directives, concepts specific to code status, and artifical feeding and hydration were discussed. The patient has not completed a living will. He tells me he wishes for continued FULL code and for everything to be done to keep him alive. Wife confirms that this was his decision in the ER. Further discussed aggressive pathway of  care for which he tells me he would agree with a trach if necessary. Further discussed recommendation for DNR/DNI with underlying age, frailty, and chronic, progressive conditions that greatly limit his chances of surviving this heroic event with meaningful quality of life. Discussed importance of discussing EOL wishes with wife/family now while he can guide decision making. Explained 'hoping for the best but preparing for the worst' with underlying irreversible lung disease. Hard Choices copy given for review.   I attempted to  elicit values and goals of care important to the patient. He plans to take this "one day at a time." Wife jokingly shares she has surprised he has lived this long with many jobs that were risky. Patient shares he hopes to go home. I did explain current high oxygen requirement that he could not be discharged with. Explained continuing current plan of care. Reassured continued support from palliative inpatient. Patient/wife appreciative.   Questions and concerns were addressed. PMT contact information given.   **Wife follows me outside of room and explains that Alejandro [name] does NOT know he has been diagnosed with Alzheimer's and the family does not want to tell him this because he will "give up." Emotional support provided. Explained that at this point, lung/heart issues are biggest concern. Again encouraged she review Hard Choices booklet and further discuss EOL wishes with her husband and family.     SUMMARY OF RECOMMENDATIONS    Initial GOC discussion with patient and wife.   Patient/wife wish for continued FULL code/FULL scope treatment. Continue current plan of care.   Strongly encouraged them to consider limitations to care (DNR/DNI) with underlying age, frailty, and chronic, irreversible lung disease. Hard Choices copy left for review.   PMT will follow inpatient.   Code Status/Advance Care Planning:  Full code  Symptom Management:   Per attending  Palliative Prophylaxis:   Aspiration, Delirium Protocol, Frequent Pain Assessment, Oral Care and Turn Reposition  Psycho-social/Spiritual:   Desire for further Chaplaincy support: yes  Additional Recommendations: Caregiving  Support/Resources, Compassionate Wean Education and Education on Hospice  Prognosis:   Guarded prognosis with acute on chronic respiratory failure secondary to progressive ILD, congestive heart failure, and nonischemic cardiomyopathy. Baseline dementia and declining functional, cognitive, and nutritional status.    Discharge Planning: To Be Determined      Primary Diagnoses: Present on Admission: . Acute on chronic respiratory failure ([place]) . Hypothyroidism . HLD (hyperlipidemia) . CKD (chronic kidney disease) stage 3, GFR 30-59 ml/min ([name]) . Atrial fibrillation with RVR ([place]) . Congestive dilated cardiomyopathy ([place]) . Acute on chronic systolic HF (heart failure) ([place])   I have reviewed the medical record, interviewed the patient and family, and examined the patient. The following aspects are pertinent.  Past Medical History:  Diagnosis Date  . Alzheimer's dementia ([place])   . CAD (coronary artery disease)    a. 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 Med rx recommended, EF 30-35% at that cath.  . CHF (congestive heart failure) ([place])   . CKD (chronic kidney disease), stage III ([place])   . Essential hypertension   . ILD (interstitial lung disease) ([place])   . Memory loss   . Non-ischemic cardiomyopathy ([place])    EF 35%  . NSVT (nonsustained ventricular tachycardia) ([name])    Negative EP study  . On home O2    PRN during day, continuous  QHS  . Orthostatic hypotension   . Paroxysmal atrial fibrillation ([name])   . Secondary cardiomyopathy ([place])    LVEF 40-45% January 2014  . Syncope   . TIA (transient ischemic attack)   . Uses walker    Social History   Socioeconomic History  . Marital status: Married    Spouse name: Not on file  . Number of children: Not on file  . Years of education: 35  . Highest education level: Not on file  Occupational History  . Occupation: Retired  Scientific laboratory technician  . Financial resource strain: Not on file  . Food insecurity:    Worry: Not on file    Inability: Not on file  . Transportation needs:    Medical: Not on file    Non-medical: Not on file   Tobacco Use  . Smoking status: Never Smoker  . Smokeless tobacco: Never Used  Substance and Sexual Activity  . Alcohol use: No    Alcohol/week: 0.0 standard drinks  . Drug use: No  . Sexual activity: Not on file  Lifestyle  . Physical activity:    Days per week: Not on file    Minutes per session: Not on file  . Stress: Not on file  Relationships  . Social connections:    Talks on phone: Not on file    Gets together: Not on file    Attends religious service: Not on file    Active member of club or organization: Not on file    Attends meetings of clubs or organizations: Not on file    Relationship status: Not on file  Other Topics Concern  . Not on file  Social History Narrative   Caffeine: 2 drinks a day    Family History  Problem Relation Age of Onset  . Heart disease Father        Diagnosed in his 82s  . Arthritis Father   . Alzheimer's disease Mother    Scheduled Meds: . furosemide  20 mg Intravenous Q12H  . levothyroxine  25 mcg Oral QAC breakfast  . mouth rinse  15 mL Mouth Rinse BID  . methylPREDNISolone (SOLU-MEDROL) injection  40 mg Intravenous Q12H  . metoprolol tartrate  12.5 mg Oral BID  . midodrine  10 mg Oral TID WC  . pantoprazole  40 mg Oral Daily  . potassium chloride  20 mEq Oral Daily  . rivaroxaban  15 mg Oral Q supper  . sodium chloride flush  3 mL Intravenous Q12H   Continuous Infusions: . sodium chloride     PRN Meds:.sodium chloride, acetaminophen, ondansetron (ZOFRAN) IV, sodium chloride flush Medications Prior to Admission:  Prior to Admission medications   Medication Sig Start Date End Date Taking? Authorizing Provider  acetaminophen (TYLENOL) 500 MG tablet Take 1,000-1,500 mg by mouth every 6 (six) hours as needed for mild pain or moderate pain.   Yes [provider]  Cholecalciferol (VITAMIN D3) 50 MCG (2000 UT) capsule Take 2,000 Units by mouth daily.   Yes [provider]  furosemide (LASIX) 20 MG tablet Take 20  mg by mouth as needed (for weight gain of 2-3 lbs in 24 hours).   Yes [provider]  levothyroxine (SYNTHROID, LEVOTHROID) 25 MCG tablet Take 25 mcg by mouth daily before breakfast.   Yes [provider]  metoprolol tartrate (LOPRESSOR) 25 MG tablet Take 0.5 tablets (12.5 mg total) by mouth 2 (two) times daily. 03/21/18 04/22/19 Yes Erline Hau, MD  midodrine ([place])  5 MG tablet Take 1 tablet (5 mg total) by mouth 3 (three) times daily with meals. Patient taking differently: Take 10 mg by mouth 3 (three) times daily with meals.  03/28/18  Yes Satira Sark, MD  Multiple Vitamins-Minerals (MULTIVITAMIN MEN 50+ PO) Take 1 tablet by mouth daily.   Yes [provider]  Rivaroxaban (XARELTO) 15 MG TABS tablet TAKE 1 TABLET BY MOUTH DAILY WITH SUPPER. Patient taking differently: Take 15 mg by mouth daily with supper. TAKE 1 TABLET BY MOUTH DAILY WITH SUPPER. 04/17/18  Yes Satira Sark, MD   No Known Allergies Review of Systems  Unable to perform ROS: Acuity of condition   Physical Exam  Constitutional: He is oriented to person, place, and time. He appears ill.  HENT:  Head: Normocephalic and atraumatic.  Cardiovascular: An irregularly irregular rhythm present.  afib RVR  Pulmonary/Chest: No accessory muscle usage. No tachypnea. No respiratory distress.  HFNC  Abdominal: There is no tenderness.  Neurological: He is alert and oriented to person, place, and time.  Periods of pleasant confusion with baseline dementia.  Skin: Skin is warm and dry. There is pallor.  Psychiatric: He has a normal mood and affect. His speech is normal and behavior is normal. Cognition and memory are impaired.  Nursing note and vitals reviewed.  Vital Signs: BP 90/68   Pulse (!) 121   Temp (!) 97.4 F (36.3 C) (Oral)   Resp 19   Ht 5' 8"  (1.727 m)   Wt 74.4 kg   SpO2 93%   BMI 24.94 kg/m  Pain Scale: 0-10   Pain Score: 0-No pain   SpO2: SpO2: 93  % O2 Device:SpO2: 93 % O2 Flow Rate: .O2 Flow Rate (L/min): 9 L/min  IO: Intake/output summary:   Intake/Output Summary (Last 24 hours) at 05/02/2018 1545 Last data filed at 05/02/2018 0500 Gross per 24 hour  Intake -  Output 625 ml  Net -625 ml    LBM: Last BM Date: 04/30/18 Baseline Weight: Weight: 81.6 kg Most recent weight: Weight: 74.4 kg     Palliative Assessment/Data: PPS 40%   Flowsheet Rows     Most Recent Value  Intake Tab  Referral Department  Hospitalist  Unit at Time of Referral  ICU  Palliative Care Primary Diagnosis  Pulmonary  Palliative Care Type  New Palliative care  Date first seen by Palliative Care  05/02/18  Clinical Assessment  Palliative Performance Scale Score  40%  Psychosocial & Spiritual Assessment  Palliative Care Outcomes  Patient/Family meeting held?  Yes  Who was at the meeting?  patient and wife  Palliative Care Outcomes  Clarified goals of care, Provided end of life care assistance, Provided psychosocial or spiritual support, ACP counseling assistance      Time In: 1200 Time Out: 1310 Time Total: 70 Greater than 50%  of this time was spent counseling and coordinating care related to the above assessment and plan.  Signed by:  Ihor Dow, FNP-C Palliative Medicine Team  Phone: [phone] Fax: [phone]   Please contact Palliative Medicine Team phone at [phone] for questions and concerns.  For individual provider: See Shea Evans

## 2018-05-02 NOTE — Progress Notes (Addendum)
Progress Note  Patient Name: Alejandro [name] Date of Encounter: 05/02/2018  Primary Cardiologist: Dr. Satira Sark  Subjective   No chest pain or palpitations.  Still requiring significant oxygen supplementation.  No cough.  Inpatient Medications    Scheduled Meds: . aspirin EC  81 mg Oral Daily  . furosemide  20 mg Intravenous Q12H  . levothyroxine  25 mcg Oral QAC breakfast  . mouth rinse  15 mL Mouth Rinse BID  . methylPREDNISolone (SOLU-MEDROL) injection  40 mg Intravenous Q12H  . metoprolol tartrate  12.5 mg Oral BID  . midodrine  10 mg Oral TID WC  . pantoprazole  40 mg Oral Daily  . potassium chloride  20 mEq Oral Daily  . rivaroxaban  15 mg Oral Q supper  . sodium chloride flush  3 mL Intravenous Q12H   Continuous Infusions: . sodium chloride     PRN Meds: sodium chloride, acetaminophen, ondansetron (ZOFRAN) IV, sodium chloride flush   Vital Signs    Vitals:   05/02/18 0630 05/02/18 0700 05/02/18 0731 05/02/18 0800  BP: (!) 86/70 90/69  90/68  Pulse: (!) 111 (!) 101 95 84  Resp: 20 19 19 19   Temp:   (!) 97.4 F (36.3 C)   TempSrc:   Oral   SpO2: 91% 91% 94% 92%  Weight:      Height:        Intake/Output Summary (Last 24 hours) at 05/02/2018 0903 Last data filed at 05/02/2018 0500 Gross per 24 hour  Intake -  Output 625 ml  Net -625 ml   Filed Weights   05/28/2018 0933 05/28/2018 1558 05/02/18 0300  Weight: 81.6 kg 74.9 kg 74.4 kg    Telemetry    Atrial fibrillation with intermittent PVCs versus aberrantly conducted complexes.  Personally reviewed.  Physical Exam   GEN:  Elderly male, chronically ill-appearing, no acute distress.   Neck: No JVD. Cardiac:  Irregularly irregular, 2/6 murmur, no gallop.  Respiratory:  Coarse breath sounds with basilar crackles, no wheezing. GI: Soft, nontender, bowel sounds present. MS: No edema; No deformity. Neuro:  Nonfocal. Psych: Alert and oriented x 2. Calm.  Labs    Chemistry Recent Labs  Lab  05/27/2018 1022 05/02/18 0351  NA 139 137  K 3.2* 3.7  CL 108 106  CO2 20* 21*  GLUCOSE 140* 134*  BUN 22 32*  CREATININE 1.36* 1.31*  CALCIUM 8.4* 8.5*  GFRNONAA 48* 50*  GFRAA 55* 58*  ANIONGAP 11 10     Hematology Recent Labs  Lab 05/03/2018 1022  WBC 13.0*  RBC 3.60*  HGB 10.9*  HCT 35.2*  MCV 97.8  MCH 30.3  MCHC 31.0  RDW 15.4  PLT 304    Cardiac Enzymes Recent Labs  Lab 05/19/2018 1022  TROPONINI 0.37*   No results for input(s): TROPIPOC in the last 168 hours.   BNP Recent Labs  Lab 05/03/2018 1022  BNP 2,353.0*     Radiology    Dg Chest Portable 1 View  Result Date: 05/26/2018 CLINICAL DATA:  Shortness of breath and cyanosis EXAM: PORTABLE CHEST 1 VIEW COMPARISON:  03/15/2018 FINDINGS: Cardiac shadow is enlarged but stable. Increased pulmonary vascular congestion is noted with mild alveolar edema bilaterally. No focal confluent infiltrate or sizable effusion is noted. No bony abnormality is seen. IMPRESSION: Changes consistent with CHF. Electronically Signed   By: Inez Catalina M.D.   On: 04/30/2018 09:51    Cardiac Studies   Echocardiogram 01/31/2018: Study Conclusions  -  Left ventricle: The cavity size was normal. Wall thickness was   increased in a pattern of mild LVH. The estimated ejection   fraction was 35%. Diffuse hypokinesis. There is hypokinesis of   the basalinferolateral myocardium. The study was not technically   sufficient to allow evaluation of LV diastolic dysfunction due to   atrial fibrillation. - Aortic valve: Mildly calcified annulus. Trileaflet; mildly   calcified leaflets. There was trivial regurgitation. - Mitral valve: There was mild regurgitation. - Left atrium: The atrium was mildly dilated. - Right atrium: Central venous pressure (est): 3 mm Hg. - Atrial septum: No defect or patent foramen ovale was identified. - Tricuspid valve: There was trivial regurgitation. - Pulmonary arteries: Systolic pressure was moderately  increased.   PA peak pressure: 50 mm Hg (S). - Pericardium, extracardiac: There was no pericardial effusion.  Patient Profile     84 y.o. male with a history of nonischemic cardiomyopathy, nonobstructive CAD as of 2016, CKD stage 3, orthostatic hypotension, paroxysmal to persistent atrial fibrillation, advancing dementia, and interstitial lung disease with chronic hypoxic respiratory failure.  Assessment & Plan    1.  Acute on chronic hypoxic respiratory failure in the setting of known interstitial lung disease and also cardiomyopathy.  Suspect some component of acute on chronic systolic heart failure, he has diuresed about 600 cc with IV Lasix.  Blood pressure limits aggressive diuresis however and he does not look to be markedly volume overloaded.  2.  History of nonobstructive CAD.  Initial troponin I 0.37, no follow-up levels as yet.  No obvious angina symptoms reported.  3.  CKD stage III, creatinine stable at 1.3.  4.  Progressive dementia.  5.  Nonischemic cardiomyopathy, LVEF 35% with diffuse hypokinesis, more prominent basal inferolateral wall by echocardiogram in September.  6.  Orthostatic hypotension, on midodrine.  7.  Persistent atrial fibrillation, placed back on low-dose Lopressor, continues on renally dosed Xarelto for stroke prophylaxis.  Stop aspirin since on Xarelto.  Continue low-dose Lopressor, midodrine, and renally dosed Xarelto.  Decrease Lasix to 20 mg IV once daily.  Will add another troponin I level to assess trend, but do not anticipate aggressive invasive work-up.  Patient still requiring significant oxygen supplementation and suspect significant pulmonary component to his presentation with interstitial lung disease.  Could consider pulmonary consultation although not clear that an aggressive work-up is the best option.  As mentioned yesterday, discussion with palliative care makes sense at this point - I did speak with the patient's wife about this  yesterday.  Signed, Rozann Lesches, MD  05/02/2018, 9:03 AM

## 2018-05-02 NOTE — Progress Notes (Signed)
Nursing staff having extremely difficult time getting an accurate O2 saturation reading on this patient. Multiple attempts at finger probes have proved unreliable, and the current forehead monitor only periodically provides a true waveform and accurate reading. O2 sats measured by RT show around 86% which correlated with patients probe at times. Other times the forehead probe would read from 70 to 90%. Will check patients O2 saturation periodically with portable probe. Pt was turned up from 13lpm HFNC to [place]

## 2018-05-03 ENCOUNTER — Inpatient Hospital Stay (HOSPITAL_COMMUNITY): Payer: Medicare Other

## 2018-05-03 LAB — BLOOD GAS, ARTERIAL
Acid-base deficit: 3.9 mmol/L — ABNORMAL HIGH (ref 0.0–2.0)
Bicarbonate: 21.5 mmol/L (ref 20.0–28.0)
Delivery systems: POSITIVE
Drawn by: 270161
Expiratory PAP: 6
FIO2: 100
INSPIRATORY PAP: 12
LHR: 16 {breaths}/min
O2 Saturation: 98.3 %
Patient temperature: 37
pCO2 arterial: 33.1 mmHg (ref 32.0–48.0)
pH, Arterial: 7.4 (ref 7.350–7.450)
pO2, Arterial: 125 mmHg — ABNORMAL HIGH (ref 83.0–108.0)

## 2018-05-03 LAB — ALBUMIN: Albumin: 2.8 g/dL — ABNORMAL LOW (ref 3.5–5.0)

## 2018-05-03 LAB — BASIC METABOLIC PANEL
Anion gap: 10 (ref 5–15)
BUN: 47 mg/dL — ABNORMAL HIGH (ref 8–23)
CALCIUM: 8.4 mg/dL — AB (ref 8.9–10.3)
CO2: 21 mmol/L — ABNORMAL LOW (ref 22–32)
CREATININE: 1.38 mg/dL — AB (ref 0.61–1.24)
Chloride: 106 mmol/L (ref 98–111)
GFR calc Af Amer: 54 mL/min — ABNORMAL LOW (ref >60)
GFR, EST NON AFRICAN AMERICAN: 47 mL/min — AB (ref >60)
Glucose, Bld: 120 mg/dL — ABNORMAL HIGH (ref 70–99)
Potassium: 3.8 mmol/L (ref 3.5–5.1)
Sodium: 137 mmol/L (ref 135–145)

## 2018-05-03 MED ORDER — FUROSEMIDE 10 MG/ML IJ SOLN
40.0000 mg | Freq: Once | INTRAMUSCULAR | Status: AC
  Administered 2018-05-03: 40 mg via INTRAVENOUS
  Filled 2018-05-03: qty 4

## 2018-05-03 MED ORDER — FUROSEMIDE 10 MG/ML IJ SOLN: 20.0000 mg | Freq: Once | INTRAMUSCULAR | Status: DC

## 2018-05-03 MED ORDER — HALOPERIDOL LACTATE 5 MG/ML IJ SOLN
2.5000 mg | Freq: Four times a day (QID) | INTRAMUSCULAR | Status: DC | PRN
  Administered 2018-05-03 – 2018-05-04 (×3): 2.5 mg via INTRAVENOUS
  Filled 2018-05-03 (×4): qty 1

## 2018-05-03 MED ORDER — METHYLPREDNISOLONE SODIUM SUCC 40 MG IJ SOLR
40.0000 mg | Freq: Four times a day (QID) | INTRAMUSCULAR | Status: DC
  Administered 2018-05-03 – 2018-05-04 (×6): 40 mg via INTRAVENOUS
  Filled 2018-05-03 (×6): qty 1

## 2018-05-03 MED ORDER — MORPHINE SULFATE (PF) 2 MG/ML IV SOLN
1.0000 mg | Freq: Once | INTRAVENOUS | Status: AC
  Administered 2018-05-03: 1 mg via INTRAVENOUS
  Filled 2018-05-03: qty 1

## 2018-05-03 MED ORDER — IPRATROPIUM-ALBUTEROL 0.5-2.5 (3) MG/3ML IN SOLN
3.0000 mL | Freq: Four times a day (QID) | RESPIRATORY_TRACT | Status: DC
  Administered 2018-05-03 – 2018-05-04 (×5): 3 mL via RESPIRATORY_TRACT
  Filled 2018-05-03 (×6): qty 3

## 2018-05-03 NOTE — Consult Note (Signed)
Consult requested by: [facility], Dr. Manuella Ghazi Consult requested for: Respiratory failure  HPI: This is an 84 year old with background interstitial lung disease and who also has coronary disease heart failure chronic kidney disease hypertension dementia paroxysmal atrial fib.  He was in his usual state of poor health at home when he developed increasing shortness of breath was noted to be cyanotic.  This is been going on for about 3 to 4 days.  His wife had apparently noticed some cyanosis of his lips and his oxygen level was checked and it was around 50% despite his oxygen.  At the time he denied chest pain palpitations fever chills nausea vomiting cough dysuria hematuria melena or hematochezia but he is not really able to provide any history now.  History is from the medical record and from hospitalist attending Dr. Manuella Ghazi.  He is on BiPAP now but keeps taking the BiPAP off.  His oxygenation looks marginal but we are not certain that we are getting accurate information.  Chest x-ray that I have personally reviewed looks a little worse than the chest x-ray on 12 2.  Past Medical History:  Diagnosis Date  . Alzheimer's dementia ([place])   . CAD (coronary artery disease)    a. 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 Med rx recommended, EF 30-35% at that cath.  . CHF (congestive heart failure) ([place])   . CKD (chronic kidney disease), stage III ([place])   . Essential hypertension   . ILD (interstitial lung disease) ([place])   . Memory loss   . Non-ischemic cardiomyopathy ([place])    EF 35%  . NSVT (nonsustained ventricular tachycardia) ([name])    Negative EP study  . On home O2    PRN during day, continuous QHS  . Orthostatic hypotension   . Paroxysmal atrial fibrillation ([name])   . Secondary  cardiomyopathy ([place])    LVEF 40-45% January 2014  . Syncope   . TIA (transient ischemic attack)   . Uses walker      Family History  Problem Relation Age of Onset  . Heart disease Father        Diagnosed in his 53s  . Arthritis Father   . Alzheimer's disease Mother      Social History   Socioeconomic History  . Marital status: Married    Spouse name: Not on file  . Number of children: Not on file  . Years of education: 25  . Highest education level: Not on file  Occupational History  . Occupation: Retired  Scientific laboratory technician  . Financial resource strain: Not on file  . Food insecurity:    Worry: Not on file    Inability: Not on file  . Transportation needs:    Medical: Not on file    Non-medical: Not on file  Tobacco Use  . Smoking status: Never Smoker  . Smokeless tobacco: Never Used  Substance and Sexual Activity  . Alcohol use: No    Alcohol/week: 0.0 standard drinks  . Drug use: No  . Sexual activity: Not on file  Lifestyle  . Physical activity:    Days per week: Not on file    Minutes per session: Not on file  . Stress: Not on file  Relationships  . Social connections:    Talks on phone: Not on file    Gets together: Not on  file    Attends religious service: Not on file    Active member of club or organization: Not on file    Attends meetings of clubs or organizations: Not on file    Relationship status: Not on file  Other Topics Concern  . Not on file  Social History Narrative   Caffeine: 2 drinks a day      ROS: Otherwise 10 point review of systems not really able to be obtained    Objective: Vital signs in last 24 hours: Temp:  [97.2 F (36.2 C)-98.2 F (36.8 C)] 97.4 F (36.3 C) (12/04 0756) Pulse Rate:  [101-132] 122 (12/04 0800) Resp:  [19-25] 22 (12/04 0800) BP: (81-98)/(66-71) 83/69 (12/04 0710) SpO2:  [73 %-93 %] 90 % (12/04 0800) FiO2 (%):  [100 %] 100 % (12/04 0710) Weight change:  Last BM Date: 05/02/18  Intake/Output from  previous day: 12/03 0701 - 12/04 0700 In: -  Out: 300 [Urine:300]  PHYSICAL EXAM Constitutional: He is on BiPAP.  I cannot tell if he is confused or not.  He does answer questions.  Eyes: Pupils react.  Ears nose mouth and throat: Limited exam because of BiPAP but his mucous membranes appear to be moist.  Respiratory: He has marked bilateral crackles and wheezing.  Vascular: His heart is regular I do not hear a gallop.  He does have edema of both legs.  Gastrointestinal: His abdomen is soft with no masses.  Musculoskeletal: He is able to move all 4 extremities.  Neurological: Cannot fully assess.  Psychiatric: Cannot fully assess  Lab Results: Basic Metabolic Panel: Recent Labs    05/23/2018 1022 05/02/18 0351 05/03/18 0418  NA 139 137 137  K 3.2* 3.7 3.8  CL 108 106 106  CO2 20* 21* 21*  GLUCOSE 140* 134* 120*  BUN 22 32* 47*  CREATININE 1.36* 1.31* 1.38*  CALCIUM 8.4* 8.5* 8.4*  MG 2.0  --   --    Liver Function Tests: No results for input(s): AST, ALT, ALKPHOS, BILITOT, PROT, ALBUMIN in the last 72 hours. No results for input(s): LIPASE, AMYLASE in the last 72 hours. No results for input(s): AMMONIA in the last 72 hours. CBC: Recent Labs    05/24/2018 1022  WBC 13.0*  NEUTROABS 9.6*  HGB 10.9*  HCT 35.2*  MCV 97.8  PLT 304   Cardiac Enzymes: Recent Labs    05/29/2018 1022 05/02/18 0351  TROPONINI 0.37* 0.29*   BNP: No results for input(s): PROBNP in the last 72 hours. D-Dimer: No results for input(s): DDIMER in the last 72 hours. CBG: No results for input(s): GLUCAP in the last 72 hours. Hemoglobin A1C: No results for input(s): HGBA1C in the last 72 hours. Fasting Lipid Panel: No results for input(s): CHOL, HDL, LDLCALC, TRIG, CHOLHDL, LDLDIRECT in the last 72 hours. Thyroid Function Tests: Recent Labs    05/19/2018 1022  TSH 1.642   Anemia Panel: No results for input(s): VITAMINB12, FOLATE, FERRITIN, TIBC, IRON, RETICCTPCT in the last 72  hours. Coagulation: No results for input(s): LABPROT, INR in the last 72 hours. Urine Drug Screen: Drugs of Abuse     Component Value Date/Time   LABOPIA NONE DETECTED 12/26/2014 0352   COCAINSCRNUR NONE DETECTED 12/26/2014 0352   LABBENZ NONE DETECTED 12/26/2014 0352   AMPHETMU NONE DETECTED 12/26/2014 0352   THCU NONE DETECTED 12/26/2014 0352   LABBARB NONE DETECTED 12/26/2014 0352    Alcohol Level: No results for input(s): ETH in the last 72 hours. Urinalysis:  No results for input(s): COLORURINE, LABSPEC, [place], GLUCOSEU, HGBUR, BILIRUBINUR, KETONESUR, PROTEINUR, UROBILINOGEN, NITRITE, LEUKOCYTESUR in the last 72 hours.  Invalid input(s): APPERANCEUR Misc. Labs:   ABGS: No results for input(s): PHART, PO2ART, TCO2, HCO3 in the last 72 hours.  Invalid input(s): PCO2   MICROBIOLOGY: Recent Results (from the past 240 hour(s))  MRSA PCR Screening     Status: None   Collection Time: 05/18/2018  3:18 PM  Result Value Ref Range Status   MRSA by PCR NEGATIVE NEGATIVE Final    Comment:        The GeneXpert MRSA Assay (FDA approved for NASAL specimens only), is one component of a comprehensive MRSA colonization surveillance program. It is not intended to diagnose MRSA infection nor to guide or monitor treatment for MRSA infections. Performed at [facility], [address] [place], [place] [address]     Studies/Results: Dg Chest Port 1 View  Result Date: 05/03/2018 CLINICAL DATA:  Dyspnea. Decreased oxygen saturation. EXAM: PORTABLE CHEST 1 VIEW COMPARISON:  05/18/2018 FINDINGS: The cardiac silhouette remains enlarged. Diffuse interstitial and patchy airspace opacities throughout both lungs have not significantly changed. No sizable pleural effusion or pneumothorax is identified. IMPRESSION: Unchanged diffuse bilateral lung opacities which may reflect edema superimposed on chronic interstitial lung disease. Electronically Signed   By: Logan Bores M.D.   On: 05/03/2018  08:02   Dg Chest Portable 1 View  Result Date: 05/26/2018 CLINICAL DATA:  Shortness of breath and cyanosis EXAM: PORTABLE CHEST 1 VIEW COMPARISON:  03/15/2018 FINDINGS: Cardiac shadow is enlarged but stable. Increased pulmonary vascular congestion is noted with mild alveolar edema bilaterally. No focal confluent infiltrate or sizable effusion is noted. No bony abnormality is seen. IMPRESSION: Changes consistent with CHF. Electronically Signed   By: Inez Catalina M.D.   On: 05/26/2018 09:51    Medications:  Prior to Admission:  Medications Prior to Admission  Medication Sig Dispense Refill Last Dose  . acetaminophen (TYLENOL) 500 MG tablet Take 1,000-1,500 mg by mouth every 6 (six) hours as needed for mild pain or moderate pain.   unknown  . Cholecalciferol (VITAMIN D3) 50 MCG (2000 UT) capsule Take 2,000 Units by mouth daily.   04/30/2018 at Unknown time  . furosemide (LASIX) 20 MG tablet Take 20 mg by mouth as needed (for weight gain of 2-3 lbs in 24 hours).   unknown  . levothyroxine (SYNTHROID, LEVOTHROID) 25 MCG tablet Take 25 mcg by mouth daily before breakfast.   05/08/2018 at Unknown time  . metoprolol tartrate (LOPRESSOR) 25 MG tablet Take 0.5 tablets (12.5 mg total) by mouth 2 (two) times daily. 30 tablet 0 04/30/2018 at 0600pm  . midodrine (PROAMATINE) 5 MG tablet Take 1 tablet (5 mg total) by mouth 3 (three) times daily with meals. (Patient taking differently: Take 10 mg by mouth 3 (three) times daily with meals. ) 90 tablet 1 04/30/2018 at Unknown time  . Multiple Vitamins-Minerals (MULTIVITAMIN MEN 50+ PO) Take 1 tablet by mouth daily.   04/30/2018 at Unknown time  . Rivaroxaban (XARELTO) 15 MG TABS tablet TAKE 1 TABLET BY MOUTH DAILY WITH SUPPER. (Patient taking differently: Take 15 mg by mouth daily with supper. TAKE 1 TABLET BY MOUTH DAILY WITH SUPPER.) 30 tablet 6 04/30/2018 at 0600pm   Scheduled: . furosemide  20 mg Intravenous Q12H  . ipratropium-albuterol  3 mL Nebulization Q6H  .  levothyroxine  25 mcg Oral QAC breakfast  . mouth rinse  15 mL Mouth Rinse BID  . methylPREDNISolone (  SOLU-MEDROL) injection  40 mg Intravenous Q6H  . metoprolol tartrate  12.5 mg Oral BID  . midodrine  10 mg Oral TID WC  . pantoprazole  40 mg Oral Daily  . potassium chloride  20 mEq Oral Daily  . rivaroxaban  15 mg Oral Q supper  . sodium chloride flush  3 mL Intravenous Q12H   Continuous: . sodium chloride     UYQ:IHKVQQ chloride, acetaminophen, haloperidol lactate, ondansetron (ZOFRAN) IV, sodium chloride flush  Assesment: He was admitted with acute on chronic respiratory failure.  He has cardiomyopathy and acute on chronic systolic heart failure.  All of this is complicated by the fact that at baseline he has interstitial lung disease and has had non-STEMI.  Chest x-ray that I have personally reviewed looks a little bit worse although his rate is approximately the same by radiology.  BNP was greater than 2000 on admission.  He is on BiPAP on 100% oxygen with marginal oxygenation.  On admission at least family and patient requested full measures and if they continue that I think he is going to end up needing intubation and mechanical ventilation.  He has had atrial fib with RVR  He has chronic kidney disease about the same  He is critically ill with multisystem failure and high risk of needing intubation and mechanical ventilation Principal Problem:   Acute on chronic respiratory failure ([name]) Active Problems:   Atrial fibrillation with RVR ([name])   Congestive dilated cardiomyopathy ([name])   HLD (hyperlipidemia)   CKD (chronic kidney disease) stage 3, GFR 30-59 ml/min ([name])   Hypothyroidism   Acute on chronic systolic HF (heart failure) ([place])   Palliative care by specialist   Goals of care, counseling/discussion   Hypoxia   Interstitial lung disease ([place])   NSTEMI (non-ST elevated myocardial infarction) ([place])    Plan: Continue treatments.  Await blood gas.  Potential  intubation and ventilation.    LOS: 2 days   Stefanee Mckell L 05/03/2018, 8:18 AM

## 2018-05-03 NOTE — Progress Notes (Signed)
Spoke with RN; patient not needing BIPAP at this time. Order was put in PRN in case patient's respiratory drive decreases with medications that are going to be given for patient's continued agitation. RN to call if RT is needed.

## 2018-05-03 NOTE — Progress Notes (Signed)
PROGRESS NOTE    Alejandro [name]  LEX:[id number] DOB: January 06, 1933 DOA: 05/21/2018 PCP: Glenda Chroman, MD   Brief Narrative:  84 y.o.malewith PMH significant for dementia, systolic CHF, non obstructive CAD, stage 3 CKD, atrial fibrillation on xarelto,HTN and chronic resp failure due to IPF; who presented to ED complaining of SOB and lips cyanosis. Patient is confused at baseline and hx mainly provided by his wife. She reported that patient has had increase SOB for the last 3-4 days and has experienced DOE. She noticed even some purplish changes on his lips. O2 sat was checked at home by wife and was in the 50%; despite increase in supplementation his O2 sat remains in the mid 80's%  Patient himself denies CP, palpitations, fever, chills,nausea, vomiting, cough, dysuria, hematuria, melena or hematochezia.   In the ED he was found to be in acute resp distress and on A. Fib with mild RVR. BP was soft (but he chronically has low BP); elevated BNP, vascular congestion and alveolar edema on CXR; patient also had troponin of 0.37   Assessment & Plan:   Principal Problem:   Acute on chronic respiratory failure ([name]) Active Problems:   Atrial fibrillation with RVR ([name])   Congestive dilated cardiomyopathy ([name])   HLD (hyperlipidemia)   CKD (chronic kidney disease) stage 3, GFR 30-59 ml/min ([name])   Hypothyroidism   Acute on chronic systolic HF (heart failure) ([name])   Palliative care by specialist   Goals of care, counseling/discussion   Hypoxia   Interstitial lung disease ([place])   NSTEMI (non-ST elevated myocardial infarction) ([place])  1-acute on chronic respiratory failure in the setting of further progression of his ILD and acute systolic heart failure-persistent -Continue current diuretics dose with increased Lasix 60mg  this am as discussed with Cardiology after review of chest x-ray -Patient will have increased steroids to assist with his oxygen demand from a ILD standpoint per  Pulmonology; reviewed ABG with no need for intubation at this time. -Duonebs q6 hours -Try to wean bipap as tolerated -Continue oxygen supplementation and wean down as tolerated -Follow cardiology recommendations -Follow-up: From palliative care goals of care meeting. -Patient denies chest pain -Continue daily weights, strict I's and O's and low-sodium diet. -Continue low-dose of metoprolol -Most recent ejection fraction in September 2019 35%. -Troponin has trended down  2-atrial fibrillation with RVR-resolved -Heart rate is slightly better control but is still irregular -Continue metoprolol -Continue Xarelto -Continue telemetry monitoring.  3-hyperlipidemia -continue statins  4-chronic kidney disease -Stage III at baseline -Has remained stable -Will continue to follow trend while actively diuresing him.  5-hypothyroidism -Continue Synthroid -TSH within normal limits  6-hypokalemia-resolved -Repleted and within normal limits currently -Follow electrolytes trend -Magnesium within normal limits.  7-GERD -Continue PPI  8-history of orthostatic hypotension -We will continue the use of midodrine  9-elevated troponin -In the setting of demand ischemia/NSTEMI -Patient denies chest pain -cycling has demonstrated trending down troponin -Not really a candidate for intervention -Continue treatment for heart failure -Continue beta-blocker, aspirin and statins. -Follow cardiology recommendations.  DVT prophylaxis: Xarelto Code Status: Full code Family Communication: No family at bedside Disposition Plan: Remains in a stepdown, continue diuresis and steroids; [place] [facility] and Pulmonology recommendations. Consider intubation if needed.  Consultants:   Cardiology  Pulmonology  Palliative care  Procedures:   None  Antimicrobials:   None   Subjective: Patient seen and evaluated today with ongoing need for BiPAP and some air hunger that is  developed.  No acute concerns overnight.  He is requiring significant oxygen supplementation.  Objective: Vitals:   05/03/18 0900 05/03/18 1000 05/03/18 1027 05/03/18 1150  BP: 90/74 (!) 86/75    Pulse: (!) 114 86    Resp: 19 19 18    Temp:    97.8 F (36.6 C)  TempSrc:    Axillary  SpO2: 99% 99% (!) 75%   Weight:      Height:        Intake/Output Summary (Last 24 hours) at 05/03/2018 1333 Last data filed at 05/03/2018 1029 Gross per 24 hour  Intake 3 ml  Output 300 ml  Net -297 ml   Filed Weights   05/14/2018 0933 05/10/2018 1558 05/02/18 0300  Weight: 81.6 kg 74.9 kg 74.4 kg    Examination:  General exam: Appears calm and comfortable  Respiratory system: Clear to auscultation. Respiratory effort normal. Cardiovascular system: S1 & S2 heard, RRR. No JVD, murmurs, rubs, gallops or clicks. No pedal edema. Gastrointestinal system: Abdomen is nondistended, soft and nontender. No organomegaly or masses felt. Normal bowel sounds heard. Central nervous system: Alert and oriented. No focal neurological deficits. Extremities: Symmetric 5 x 5 power. Skin: No rashes, lesions or ulcers Psychiatry: Judgement and insight appear normal. Mood & affect appropriate.     Data Reviewed: I have personally reviewed following labs and imaging studies  CBC: Recent Labs  Lab 05/16/2018 1022  WBC 13.0*  NEUTROABS 9.6*  HGB 10.9*  HCT 35.2*  MCV 97.8  PLT 030   Basic Metabolic Panel: Recent Labs  Lab 05/25/2018 1022 05/02/18 0351 05/03/18 0418  NA 139 137 137  K 3.2* 3.7 3.8  CL 108 106 106  CO2 20* 21* 21*  GLUCOSE 140* 134* 120*  BUN 22 32* 47*  CREATININE 1.36* 1.31* 1.38*  CALCIUM 8.4* 8.5* 8.4*  MG 2.0  --   --    GFR: Estimated Creatinine Clearance: 39.2 mL/min (A) (by C-G formula based on SCr of 1.38 mg/dL (H)). Liver Function Tests: Recent Labs  Lab 05/03/18 0800  ALBUMIN 2.8*   No results for input(s): LIPASE, AMYLASE in the last 168 hours. No results for input(s):  AMMONIA in the last 168 hours. Coagulation Profile: No results for input(s): INR, PROTIME in the last 168 hours. Cardiac Enzymes: Recent Labs  Lab 05/03/2018 1022 05/02/18 0351  TROPONINI 0.37* 0.29*   BNP (last 3 results) No results for input(s): PROBNP in the last 8760 hours. HbA1C: No results for input(s): HGBA1C in the last 72 hours. CBG: No results for input(s): GLUCAP in the last 168 hours. Lipid Profile: No results for input(s): CHOL, HDL, LDLCALC, TRIG, CHOLHDL, LDLDIRECT in the last 72 hours. Thyroid Function Tests: Recent Labs    05/27/2018 1022  TSH 1.642   Anemia Panel: No results for input(s): VITAMINB12, FOLATE, FERRITIN, TIBC, IRON, RETICCTPCT in the last 72 hours. Sepsis Labs: No results for input(s): PROCALCITON, LATICACIDVEN in the last 168 hours.  Recent Results (from the past 240 hour(s))  MRSA PCR Screening     Status: None   Collection Time: 05/03/2018  3:18 PM  Result Value Ref Range Status   MRSA by PCR NEGATIVE NEGATIVE Final    Comment:        The GeneXpert MRSA Assay (FDA approved for NASAL specimens only), is one component of a comprehensive MRSA colonization surveillance program. It is not intended to diagnose MRSA infection nor to guide or monitor treatment for MRSA infections. Performed at [facility], [address] [place], [place] [address]  Radiology Studies: Dg Chest Port 1 View  Result Date: 05/03/2018 CLINICAL DATA:  Dyspnea. Decreased oxygen saturation. EXAM: PORTABLE CHEST 1 VIEW COMPARISON:  04/30/2018 FINDINGS: The cardiac silhouette remains enlarged. Diffuse interstitial and patchy airspace opacities throughout both lungs have not significantly changed. No sizable pleural effusion or pneumothorax is identified. IMPRESSION: Unchanged diffuse bilateral lung opacities which may reflect edema superimposed on chronic interstitial lung disease. Electronically Signed   By: Logan Bores M.D.   On: 05/03/2018 08:02         Scheduled Meds: . furosemide  20 mg Intravenous Q12H  . ipratropium-albuterol  3 mL Nebulization Q6H  . levothyroxine  25 mcg Oral QAC breakfast  . mouth rinse  15 mL Mouth Rinse BID  . methylPREDNISolone (SOLU-MEDROL) injection  40 mg Intravenous Q6H  . metoprolol tartrate  12.5 mg Oral BID  . midodrine  10 mg Oral TID WC  . pantoprazole  40 mg Oral Daily  . potassium chloride  20 mEq Oral Daily  . rivaroxaban  15 mg Oral Q supper  . sodium chloride flush  3 mL Intravenous Q12H   Continuous Infusions: . sodium chloride       LOS: 2 days    Time spent: 45 minutes of critical care time spent on patient care.    Brindley Madarang Darleen Crocker, DO [facility] Pager [phone]  If 7PM-7AM, please contact night-coverage [email or web address] Password [email or web address] 05/03/2018, 1:33 PM

## 2018-05-03 NOTE — Progress Notes (Signed)
Pt is very agitated. Continues to pull off oxygen, leads, and gown. He is asking for police. He doesn't remember he is in the hospital. He is yelling out and being belligerent. MD and midlevel made aware. Order received for prn Haldol. Safety mitts were placed on his hands which only made matters worse. Mitts removed. Will try the Haldol and continue to monitor.

## 2018-05-03 NOTE — Progress Notes (Signed)
Progress Note  Patient Name: Alejandro [name] Date of Encounter: 05/03/2018  Primary Cardiologist: Dr. Satira Sark  Subjective   Patient currently on BiPAP.  Inpatient Medications    Scheduled Meds: . furosemide  20 mg Intravenous Q12H  . furosemide  40 mg Intravenous Once  . ipratropium-albuterol  3 mL Nebulization Q6H  . levothyroxine  25 mcg Oral QAC breakfast  . mouth rinse  15 mL Mouth Rinse BID  . methylPREDNISolone (SOLU-MEDROL) injection  40 mg Intravenous Q6H  . metoprolol tartrate  12.5 mg Oral BID  . midodrine  10 mg Oral TID WC  . pantoprazole  40 mg Oral Daily  . potassium chloride  20 mEq Oral Daily  . rivaroxaban  15 mg Oral Q supper  . sodium chloride flush  3 mL Intravenous Q12H   Continuous Infusions: . sodium chloride     PRN Meds: sodium chloride, acetaminophen, haloperidol lactate, ondansetron (ZOFRAN) IV, sodium chloride flush   Vital Signs    Vitals:   05/03/18 0756 05/03/18 0800 05/03/18 0828 05/03/18 0845  BP:      Pulse:  (!) 122 (!) 120   Resp:  (!) 22 (!) 22   Temp: (!) 97.4 F (36.3 C)     TempSrc: Axillary     SpO2:  90% 100% 100%  Weight:      Height:        Intake/Output Summary (Last 24 hours) at 05/03/2018 0852 Last data filed at 05/03/2018 0100 Gross per 24 hour  Intake -  Output 300 ml  Net -300 ml   Filed Weights   05/06/2018 0933 05/27/2018 1558 05/02/18 0300  Weight: 81.6 kg 74.9 kg 74.4 kg    Telemetry    Atrial fibrillation.  Personally reviewed.  Physical Exam   GEN:  Elderly male, currently in no distress.  Wearing BiPAP.  Neck: No JVD.  Cardiac:  Irregularly irregular, no gallop.  Respiratory:  Scattered rhonchi and crackles. GI: Soft, nontender, bowel sounds present. MS: No edema; No deformity. Neuro:  Nonfocal.  Labs    Chemistry Recent Labs  Lab 05/22/2018 1022 05/02/18 0351 05/03/18 0418  NA 139 137 137  K 3.2* 3.7 3.8  CL 108 106 106  CO2 20* 21* 21*  GLUCOSE 140* 134* 120*  BUN 22  32* 47*  CREATININE 1.36* 1.31* 1.38*  CALCIUM 8.4* 8.5* 8.4*  GFRNONAA 48* 50* 47*  GFRAA 55* 58* 54*  ANIONGAP 11 10 10      Hematology Recent Labs  Lab 05/03/2018 1022  WBC 13.0*  RBC 3.60*  HGB 10.9*  HCT 35.2*  MCV 97.8  MCH 30.3  MCHC 31.0  RDW 15.4  PLT 304    Cardiac Enzymes Recent Labs  Lab 05/14/2018 1022 05/02/18 0351  TROPONINI 0.37* 0.29*   No results for input(s): TROPIPOC in the last 168 hours.   BNP Recent Labs  Lab 05/24/2018 1022  BNP 2,353.0*     Radiology    Dg Chest Port 1 View  Result Date: 05/03/2018 CLINICAL DATA:  Dyspnea. Decreased oxygen saturation. EXAM: PORTABLE CHEST 1 VIEW COMPARISON:  05/06/2018 FINDINGS: The cardiac silhouette remains enlarged. Diffuse interstitial and patchy airspace opacities throughout both lungs have not significantly changed. No sizable pleural effusion or pneumothorax is identified. IMPRESSION: Unchanged diffuse bilateral lung opacities which may reflect edema superimposed on chronic interstitial lung disease. Electronically Signed   By: Logan Bores M.D.   On: 05/03/2018 08:02   Dg Chest Portable 1 View  Result Date: 05/16/2018 CLINICAL DATA:  Shortness of breath and cyanosis EXAM: PORTABLE CHEST 1 VIEW COMPARISON:  03/15/2018 FINDINGS: Cardiac shadow is enlarged but stable. Increased pulmonary vascular congestion is noted with mild alveolar edema bilaterally. No focal confluent infiltrate or sizable effusion is noted. No bony abnormality is seen. IMPRESSION: Changes consistent with CHF. Electronically Signed   By: Inez Catalina M.D.   On: 05/22/2018 09:51    Cardiac Studies   Echocardiogram 01/31/2018: Study Conclusions  - Left ventricle: The cavity size was normal. Wall thickness was   increased in a pattern of mild LVH. The estimated ejection   fraction was 35%. Diffuse hypokinesis. There is hypokinesis of   the basalinferolateral myocardium. The study was not technically   sufficient to allow evaluation of  LV diastolic dysfunction due to   atrial fibrillation. - Aortic valve: Mildly calcified annulus. Trileaflet; mildly   calcified leaflets. There was trivial regurgitation. - Mitral valve: There was mild regurgitation. - Left atrium: The atrium was mildly dilated. - Right atrium: Central venous pressure (est): 3 mm Hg. - Atrial septum: No defect or patent foramen ovale was identified. - Tricuspid valve: There was trivial regurgitation. - Pulmonary arteries: Systolic pressure was moderately increased.   PA peak pressure: 50 mm Hg (S). - Pericardium, extracardiac: There was no pericardial effusion.  Patient Profile     84 y.o. male with a history of nonischemic cardiomyopathy, nonobstructive CAD as of 2016, CKD stage3, orthostatic hypotension, paroxysmal to persistent atrial fibrillation, advancing dementia, and interstitial lung disease with chronic hypoxic respiratory failure.  Assessment & Plan    1.  Acute on chronic hypoxic respiratory failure in the setting of interstitial lung disease and cardiomyopathy.  Patient now requiring BiPAP.  Palliative care consultation obtained with discussion yesterday, at this point patient and wife still desire full supportive measures.  2.  Nonischemic cardiomyopathy with LVEF approximately 35%.  SPECT component of acute on chronic systolic heart failure although does not look to be markedly fluid overloaded.  Chest x-ray shows diffuse opacities likely reflecting a combination of known interstitial lung disease and some component of interstitial edema.  He is diuresed a net output of about 900 cc since admission.  3.  Persistent atrial fibrillation with RVR.  Continues on Lopressor and Xarelto.  4.  History of orthostatic hypotension, on midodrine.  5.  Progressive dementia..  6.  CKD stage 3, creatinine stable at 1.3.  7.  History of nonobstructive CAD, mild elevation in troponin I of 0.37 and 0.29, suspect demand ischemia rather than true  ACS.  Discussed with Dr. Manuella Ghazi.  Would give additional 40 mg IV Lasix this morning, may need to increase standing dose to achieve further diuresis, but would be careful not to be overly aggressive as I suspect a large degree of his current decompensation is related to progressive lung disease as well.  He is currently tolerating BiPAP.  Overall prognosis remains poor however.  Signed, Rozann Lesches, MD  05/03/2018, 8:52 AM

## 2018-05-03 NOTE — Progress Notes (Signed)
Respiratory Status is deteriorating at this time. No longer able to obtain reliable O2 saturations on the monitor probe or by spot check. Pt is agreeable to wearing BiPAP for now. RT contacted and will place pt on mask.

## 2018-05-03 NOTE — Progress Notes (Signed)
Pt again pulling leads and gown off. Upon entering room NT and RN discover pt has a tooth laying on his chest. Tooth 11 missing, no blood, no injury pt in no distress.

## 2018-05-03 NOTE — Progress Notes (Signed)
Pt was placed on bipap this morning. Was not tolerating well, respiratory and MD made aware. Pt did have a few hours of tolerating the bipap but soon after became agitated and started taking the bipap off. Multiple attempts were made to make patient keep bipap on, all unsuccessful. He kept saying "I can't" and "I have to get this off". Respiratory was called and she agreed to just take the bipap off and put him on HFNC at 6L/min. Patient was asked if he wanted his wife called to come here to see if that would help give him reassurance but he said no. Will continue to monitor patient's oxygen sats.

## 2018-05-04 LAB — MAGNESIUM: Magnesium: 2.4 mg/dL (ref 1.7–2.4)

## 2018-05-04 LAB — BASIC METABOLIC PANEL
Anion gap: 12 (ref 5–15)
BUN: 55 mg/dL — AB (ref 8–23)
CO2: 22 mmol/L (ref 22–32)
Calcium: 8.6 mg/dL — ABNORMAL LOW (ref 8.9–10.3)
Chloride: 102 mmol/L (ref 98–111)
Creatinine, Ser: 1.71 mg/dL — ABNORMAL HIGH (ref 0.61–1.24)
GFR calc Af Amer: 42 mL/min — ABNORMAL LOW (ref >60)
GFR calc non Af Amer: 36 mL/min — ABNORMAL LOW (ref >60)
Glucose, Bld: 147 mg/dL — ABNORMAL HIGH (ref 70–99)
Potassium: 4.3 mmol/L (ref 3.5–5.1)
Sodium: 136 mmol/L (ref 135–145)

## 2018-05-04 LAB — CBC
HCT: 37.8 % — ABNORMAL LOW (ref 39.0–52.0)
Hemoglobin: 11.5 g/dL — ABNORMAL LOW (ref 13.0–17.0)
MCH: 29.4 pg (ref 26.0–34.0)
MCHC: 30.4 g/dL (ref 30.0–36.0)
MCV: 96.7 fL (ref 80.0–100.0)
PLATELETS: 287 10*3/uL (ref 150–400)
RBC: 3.91 MIL/uL — ABNORMAL LOW (ref 4.22–5.81)
RDW: 15.4 % (ref 11.5–15.5)
WBC: 16 10*3/uL — ABNORMAL HIGH (ref 4.0–10.5)
nRBC: 0 % (ref 0.0–0.2)

## 2018-05-08 MED FILL — Medication: Qty: 1 | Status: AC

## 2018-05-31 NOTE — Progress Notes (Signed)
Patient was not able to communicate due to life's transition. However, I met his wife, grand daughter, grand son, pastor, daughters and host of other family and friends. Helped provided comfort cart and helped begin life review. Patient wife said, "I thought he was going to be around for another six months" while great grand daughter said "he did not wait for my birthday on the [birth year]." Offered comforting words for the family.

## 2018-05-31 NOTE — Progress Notes (Signed)
Code blue note: CODE BLUE was called at 1355 and patient was noted to be in PEA arrest.  Chest compressions were immediately started.  A total of 4 doses of epinephrine were administered with frequent pulse checks demonstrating pulseless electrical activity.  Dr. Sabra Heck had arrived at bedside and intubated patient with assistance from glide scope.  Patient was being bagged during the entire code with 100% O2.  Patient was noted to have a wide-complex tachyarrhythmia with heart rate in the 130-150bpm range when there was ROSC at 1417.  Chest compressions had stopped at that point and 150 mg of amiodarone was administered.  Blood pressures were cycled and systolic was approximately 119mmHg.   The blood pressure continued to drop to approximately 80 systolic and the pulse was becoming weak and thready.  Discussed with spouse and family member at bedside regarding the futility of resuming chest compressions and they had agreed for no further chest compressions or shocks.  Patient was pronounced dead at 1433.

## 2018-05-31 NOTE — Progress Notes (Signed)
Progress Note  Patient Name: Alejandro [name] Date of Encounter: 2018/05/07  Primary Cardiologist: Dr. Satira Sark  Subjective   No reported chest pain.  Appears weak and frail.  On nasal cannula oxygen at this time with intermittent use of BiPAP.  Inpatient Medications    Scheduled Meds: . furosemide  20 mg Intravenous Q12H  . ipratropium-albuterol  3 mL Nebulization Q6H  . levothyroxine  25 mcg Oral QAC breakfast  . mouth rinse  15 mL Mouth Rinse BID  . methylPREDNISolone (SOLU-MEDROL) injection  40 mg Intravenous Q6H  . metoprolol tartrate  12.5 mg Oral BID  . midodrine  10 mg Oral TID WC  . pantoprazole  40 mg Oral Daily  . potassium chloride  20 mEq Oral Daily  . rivaroxaban  15 mg Oral Q supper  . sodium chloride flush  3 mL Intravenous Q12H   Continuous Infusions: . sodium chloride     PRN Meds: sodium chloride, acetaminophen, haloperidol lactate, ondansetron (ZOFRAN) IV, sodium chloride flush   Vital Signs    Vitals:   05/07/2018 0500 May 07, 2018 0600 2018-05-07 0700 05-07-18 0756  BP:  (!) 82/66 92/75   Pulse: (!) 111 (!) 104 100   Resp: 19 17 18    Temp:    (!) 97.3 F (36.3 C)  TempSrc:    Oral  SpO2: (!) 73% (!) 83% 96%   Weight:      Height:        Intake/Output Summary (Last 24 hours) at 05/07/2018 0829 Last data filed at 05/03/2018 2200 Gross per 24 hour  Intake 63 ml  Output -  Net 63 ml   Filed Weights   05/02/18 0300 05/07/18 0255 07-May-2018 0347  Weight: 74.4 kg 75.1 kg 73.6 kg    Telemetry    Atrial fibrillation.  Personally reviewed.  Physical Exam   GEN:  Frail-appearing elderly male. No acute distress.   Neck: No JVD. Cardiac:  Irregularly irregular, no gallop.  Respiratory: Nonlabored.  Coarse breath sounds with scattered crackles. GI: Soft, nontender, bowel sounds present. MS: No edema; No deformity. Neuro:  Nonfocal. Psych: Alert and oriented x 2. Normal affect.  Labs    Chemistry Recent Labs  Lab 05/02/18 0351  05/03/18 0418 05/03/18 0800 May 07, 2018 0415  NA 137 137  --  136  K 3.7 3.8  --  4.3  CL 106 106  --  102  CO2 21* 21*  --  22  GLUCOSE 134* 120*  --  147*  BUN 32* 47*  --  55*  CREATININE 1.31* 1.38*  --  1.71*  CALCIUM 8.5* 8.4*  --  8.6*  ALBUMIN  --   --  2.8*  --   GFRNONAA 50* 47*  --  36*  GFRAA 58* 54*  --  42*  ANIONGAP 10 10  --  12     Hematology Recent Labs  Lab 05/14/2018 1022 05/07/18 0415  WBC 13.0* 16.0*  RBC 3.60* 3.91*  HGB 10.9* 11.5*  HCT 35.2* 37.8*  MCV 97.8 96.7  MCH 30.3 29.4  MCHC 31.0 30.4  RDW 15.4 15.4  PLT 304 287    Cardiac Enzymes Recent Labs  Lab 05/07/2018 1022 05/02/18 0351  TROPONINI 0.37* 0.29*   No results for input(s): TROPIPOC in the last 168 hours.   BNP Recent Labs  Lab 05/06/2018 1022  BNP 2,353.0*     Radiology    Dg Chest Port 1 View  Result Date: 05/03/2018 CLINICAL DATA:  Dyspnea. Decreased oxygen saturation. EXAM: PORTABLE CHEST 1 VIEW COMPARISON:  04/30/2018 FINDINGS: The cardiac silhouette remains enlarged. Diffuse interstitial and patchy airspace opacities throughout both lungs have not significantly changed. No sizable pleural effusion or pneumothorax is identified. IMPRESSION: Unchanged diffuse bilateral lung opacities which may reflect edema superimposed on chronic interstitial lung disease. Electronically Signed   By: Logan Bores M.D.   On: 05/03/2018 08:02    Cardiac Studies   Echocardiogram 01/31/2018: Study Conclusions  - Left ventricle: The cavity size was normal. Wall thickness was   increased in a pattern of mild LVH. The estimated ejection   fraction was 35%. Diffuse hypokinesis. There is hypokinesis of   the basalinferolateral myocardium. The study was not technically   sufficient to allow evaluation of LV diastolic dysfunction due to   atrial fibrillation. - Aortic valve: Mildly calcified annulus. Trileaflet; mildly   calcified leaflets. There was trivial regurgitation. - Mitral valve: There  was mild regurgitation. - Left atrium: The atrium was mildly dilated. - Right atrium: Central venous pressure (est): 3 mm Hg. - Atrial septum: No defect or patent foramen ovale was identified. - Tricuspid valve: There was trivial regurgitation. - Pulmonary arteries: Systolic pressure was moderately increased.   PA peak pressure: 50 mm Hg (S). - Pericardium, extracardiac: There was no pericardial effusion.  Patient Profile     85 y.o. male with a history of nonischemic cardiomyopathy, nonobstructive CAD as of 2016, CKD stage3, orthostatic hypotension, paroxysmal to persistent atrial fibrillation, advancing dementia, and interstitial lung disease with chronic hypoxic respiratory failure.  Assessment & Plan    1.  Acute on chronic hypoxic respiratory failure with known interstitial lung disease as well as nonischemic cardiomyopathy.  He continues to require significant oxygen supplementation.  He has been on IV Lasix, weight is down about 3 pounds if weights are accurate, chest x-ray still shows interstitial pattern consistent with fibrotic lung disease although cannot exclude a component of interstitial edema.  Bump in creatinine would suggest volume depletion however.  2.  Persistent atrial fibrillation, heart rate is somewhat better controlled.  He remains on Lopressor and Xarelto.  3.  CKD stage 3 with acute renal insufficiency, creatinine has increased from 1.3-1.7.  4.  Progressive dementia.  5.  Demand ischemia, troponin I of 0.37 and 0.29.  6.  Nonischemic cardiomyopathy with LVEF approximately 35%.  Patient received additional Lasix yesterday, has not had any significant clinical improvement and with bump in creatinine suggests volume depletion.  Would hold Lasix today.  Overall prognosis is poor, would still suggest pursuing comfort measures at this time.  He is on IV steroids and followed by Dr. Luan Pulling for pulmonary status.  I am hopeful that he will not require mechanical  ventilation (still full code).  Signed, Rozann Lesches, MD  2018-05-31, 8:29 AM

## 2018-05-31 NOTE — Progress Notes (Addendum)
Subjective: He has been off and on BiPAP and high flow nasal cannula.  His oxygen saturation is still widely variable.  He has no complaints this morning.  Objective: Vital signs in last 24 hours: Temp:  [97.3 F (36.3 C)-98.1 F (36.7 C)] 97.3 F (36.3 C) (12/05 0347) Pulse Rate:  [86-136] 100 (12/05 0700) Resp:  [17-35] 18 (12/05 0700) BP: (80-118)/(66-81) 92/75 (12/05 0700) SpO2:  [68 %-100 %] 96 % (12/05 0700) FiO2 (%):  [80 %] 80 % (12/04 2030) Weight:  [73.6 kg-75.1 kg] 73.6 kg (12/05 0347) Weight change:  Last BM Date: 05/02/18  Intake/Output from previous day: 12/04 0701 - 12/05 0700 In: 63 [P.O.:60; I.V.:3] Out: -   PHYSICAL EXAM General appearance: alert, mild distress and On BiPAP Resp: He has diffuse bilateral rales Cardio: irregularly irregular rhythm GI: soft, non-tender; bowel sounds normal; no masses,  no organomegaly Extremities: He still has edema bilaterally.  Lab Results:  Results for orders placed or performed during the hospital encounter of 05/15/2018 (from the past 48 hour(s))  Basic metabolic panel     Status: Abnormal   Collection Time: 05/03/18  4:18 AM  Result Value Ref Range   Sodium 137 135 - 145 mmol/L   Potassium 3.8 3.5 - 5.1 mmol/L   Chloride 106 98 - 111 mmol/L   CO2 21 (L) 22 - 32 mmol/L   Glucose, Bld 120 (H) 70 - 99 mg/dL   BUN 47 (H) 8 - 23 mg/dL   Creatinine, Ser 1.38 (H) 0.61 - 1.24 mg/dL   Calcium 8.4 (L) 8.9 - 10.3 mg/dL   GFR calc non Af Amer 47 (L) >60 mL/min   GFR calc Af Amer 54 (L) >60 mL/min   Anion gap 10 5 - 15    Comment: Performed at [facility], [address], [place], [place] [address]  Albumin     Status: Abnormal   Collection Time: 05/03/18  8:00 AM  Result Value Ref Range   Albumin 2.8 (L) 3.5 - 5.0 g/dL    Comment: Performed at [facility], [address] [place], [place] [address]  Blood gas, arterial     Status: Abnormal   Collection Time: 05/03/18  8:10 AM  Result Value Ref Range   FIO2 100.00    Delivery systems BILEVEL POSITIVE AIRWAY PRESSURE    LHR 16 resp/min   Inspiratory PAP 12    Expiratory PAP 6    pH, Arterial 7.40 7.350 - 7.450   pCO2 arterial 33.1 32.0 - 48.0 mmHg   pO2, Arterial 125 (H) 83.0 - 108.0 mmHg   Bicarbonate 21.5 20.0 - 28.0 mmol/L   Acid-base deficit 3.9 (H) 0.0 - 2.0 mmol/L   O2 Saturation 98.3 %   Patient temperature 37.0    Collection site RIGHT RADIAL    Drawn by [id number]    Sample type ARTERIAL DRAW    Allens test (pass/fail) PASS PASS    Comment: Performed at [facility], [address], [place], [place] [address]  Basic metabolic panel     Status: Abnormal   Collection Time: 05-11-18  4:15 AM  Result Value Ref Range   Sodium 136 135 - 145 mmol/L   Potassium 4.3 3.5 - 5.1 mmol/L   Chloride 102 98 - 111 mmol/L   CO2 22 22 - 32 mmol/L   Glucose, Bld 147 (H) 70 - 99 mg/dL   BUN 55 (H) 8 - 23 mg/dL   Creatinine, Ser 1.71 (H) 0.61 - 1.24 mg/dL  Calcium 8.6 (L) 8.9 - 10.3 mg/dL   GFR calc non Af Amer 36 (L) >60 mL/min   GFR calc Af Amer 42 (L) >60 mL/min   Anion gap 12 5 - 15    Comment: Performed at [facility], [address] [place], [place] [address]  CBC     Status: Abnormal   Collection Time: 26-May-2018  4:15 AM  Result Value Ref Range   WBC 16.0 (H) 4.0 - 10.5 K/uL   RBC 3.91 (L) 4.22 - 5.81 MIL/uL   Hemoglobin 11.5 (L) 13.0 - 17.0 g/dL   HCT 37.8 (L) 39.0 - 52.0 %   MCV 96.7 80.0 - 100.0 fL   MCH 29.4 26.0 - 34.0 pg   MCHC 30.4 30.0 - 36.0 g/dL   RDW 15.4 11.5 - 15.5 %   Platelets 287 150 - 400 K/uL   nRBC 0.0 0.0 - 0.2 %    Comment: Performed at [facility], [address], [place], [place] [address]  Magnesium     Status: None   Collection Time: 05-26-2018  4:15 AM  Result Value Ref Range   Magnesium 2.4 1.7 - 2.4 mg/dL    Comment: Performed at [facility], [address] [place], [place] [address]    ABGS Recent Labs    05/03/18 0810  PHART 7.40  PO2ART 125*  HCO3 21.5   CULTURES Recent Results (from the past 240  hour(s))  MRSA PCR Screening     Status: None   Collection Time: 05/02/2018  3:18 PM  Result Value Ref Range Status   MRSA by PCR NEGATIVE NEGATIVE Final    Comment:        The GeneXpert MRSA Assay (FDA approved for NASAL specimens only), is one component of a comprehensive MRSA colonization surveillance program. It is not intended to diagnose MRSA infection nor to guide or monitor treatment for MRSA infections. Performed at [facility], [address] [place], [place] [address]    Studies/Results: Dg Chest Port 1 View  Result Date: 05/03/2018 CLINICAL DATA:  Dyspnea. Decreased oxygen saturation. EXAM: PORTABLE CHEST 1 VIEW COMPARISON:  05/13/2018 FINDINGS: The cardiac silhouette remains enlarged. Diffuse interstitial and patchy airspace opacities throughout both lungs have not significantly changed. No sizable pleural effusion or pneumothorax is identified. IMPRESSION: Unchanged diffuse bilateral lung opacities which may reflect edema superimposed on chronic interstitial lung disease. Electronically Signed   By: Logan Bores M.D.   On: 05/03/2018 08:02    Medications:  I have reviewed the patient's current medications. Prior to Admission:  Medications Prior to Admission  Medication Sig Dispense Refill Last Dose  . acetaminophen (TYLENOL) 500 MG tablet Take 1,000-1,500 mg by mouth every 6 (six) hours as needed for mild pain or moderate pain.   unknown  . Cholecalciferol (VITAMIN D3) 50 MCG (2000 UT) capsule Take 2,000 Units by mouth daily.   04/30/2018 at Unknown time  . furosemide (LASIX) 20 MG tablet Take 20 mg by mouth as needed (for weight gain of 2-3 lbs in 24 hours).   unknown  . levothyroxine (SYNTHROID, LEVOTHROID) 25 MCG tablet Take 25 mcg by mouth daily before breakfast.   05/11/2018 at Unknown time  . metoprolol tartrate (LOPRESSOR) 25 MG tablet Take 0.5 tablets (12.5 mg total) by mouth 2 (two) times daily. 30 tablet 0 04/30/2018 at 0600pm  . midodrine (PROAMATINE) 5 MG  tablet Take 1 tablet (5 mg total) by mouth 3 (three) times daily with meals. (Patient taking differently: Take 10 mg  by mouth 3 (three) times daily with meals. ) 90 tablet 1 04/30/2018 at Unknown time  . Multiple Vitamins-Minerals (MULTIVITAMIN MEN 50+ PO) Take 1 tablet by mouth daily.   04/30/2018 at Unknown time  . Rivaroxaban (XARELTO) 15 MG TABS tablet TAKE 1 TABLET BY MOUTH DAILY WITH SUPPER. (Patient taking differently: Take 15 mg by mouth daily with supper. TAKE 1 TABLET BY MOUTH DAILY WITH SUPPER.) 30 tablet 6 04/30/2018 at 0600pm   Scheduled: . furosemide  20 mg Intravenous Q12H  . ipratropium-albuterol  3 mL Nebulization Q6H  . levothyroxine  25 mcg Oral QAC breakfast  . mouth rinse  15 mL Mouth Rinse BID  . methylPREDNISolone (SOLU-MEDROL) injection  40 mg Intravenous Q6H  . metoprolol tartrate  12.5 mg Oral BID  . midodrine  10 mg Oral TID WC  . pantoprazole  40 mg Oral Daily  . potassium chloride  20 mEq Oral Daily  . rivaroxaban  15 mg Oral Q supper  . sodium chloride flush  3 mL Intravenous Q12H   Continuous: . sodium chloride     MVE:HMCNOB chloride, acetaminophen, haloperidol lactate, ondansetron (ZOFRAN) IV, sodium chloride flush  Assesment: He has acute on chronic hypoxic respiratory failure.  This is multifactorial but includes baseline pulmonary fibrosis.  I have personally reviewed the CT and it does show some honeycombing so he is advanced with his pulmonary fibrosis.  It has the typical appearance of UIP.  Additionally he has acute on chronic systolic heart failure and has had a non-STEMI.Principal Problem:   Acute on chronic respiratory failure ([name]) Active Problems:   Atrial fibrillation with RVR ([name])   Congestive dilated cardiomyopathy ([name])   HLD (hyperlipidemia)   CKD (chronic kidney disease) stage 3, GFR 30-59 ml/min ([name])   Hypothyroidism   Acute on chronic systolic HF (heart failure) ([place])   Palliative care by specialist   Goals of care,  counseling/discussion   Hypoxia   Interstitial lung disease ([place])   NSTEMI (non-ST elevated myocardial infarction) ([place])  Continuation of assessment his blood pressure has been low so it is been difficult to get effective diuresis but he is -862 since admission and was -922 yesterday.  However urine output is inadequately recorded.  He is off and on BiPAP but does not tolerate BiPAP very well.  He continues hypoxic on high flow nasal cannula and on 80% on the BiPAP with fairly wide variation in his oxygen saturation.  Some of that may be inaccurate.  Trying to avoid intubation and mechanical ventilation because I think he will be exceptionally difficult to come off and he seems to be tolerating his current situation.  Long-term prognosis is exceptionally poor with UIP, cardiac disease heart failure.  Plan: Continue conservative treatment.  BiPAP as needed.  Continue to try diuresis.  Continue high flow nasal cannula.  Continue IV steroids as it may help some with his pulmonary fibrosis.  Intubate and ventilate if necessary    LOS: 3 days   Danaysia Rader L 2018/05/08, 7:37 AM

## 2018-05-31 NOTE — Progress Notes (Signed)
[facility] contacted. Spoke with Alvira Philips. Pt not a donor candidate. Reference #[id number]

## 2018-05-31 NOTE — Progress Notes (Signed)
Chart reviewed. Patient coded this afternoon and expired. Discussed with RN. Visited with wife and family at bedside. Patient's pastor is at bedside. Daughters on the way to the hospital. Therapeutic listening and emotional/spiritual support provided.   NO CHARGE  Ihor Dow, [place], FNP-C Palliative Medicine Team  Phone: [phone] Fax: [phone]

## 2018-05-31 NOTE — Progress Notes (Signed)
SLP Cancellation Note  Patient Details Name: Alejandro [name] MRN: [id number] DOB: Sep 15, 1932   Cancelled treatment:       Reason Eval/Treat Not Completed: Other (comment)(Pt on Bi-PAP; RN will remove and SLP will check back in 30 m)  Thank you,  Genene Churn, [place]  [place] May 27, 2018, 1:46 PM

## 2018-05-31 NOTE — ED Provider Notes (Signed)
[facility]  Department of Emergency Medicine   Code Blue CONSULT NOTE  Chief Complaint: Cardiac arrest/unresponsive   Level V Caveat: Unresponsive  History of present illness: I was contacted by the hospital for a CODE BLUE cardiac arrest upstairs and presented to the patient's bedside.  I was called to the bedside due to cardiac arrest.  The patient is not breathing, he has no pulse, CPR was initiated prior to my arrival.  I was asked to intubate the patient due to inadequate respirations.  ROS: Unable to obtain, Level V caveat  Scheduled Meds: . ipratropium-albuterol  3 mL Nebulization Q6H  . levothyroxine  25 mcg Oral QAC breakfast  . mouth rinse  15 mL Mouth Rinse BID  . methylPREDNISolone (SOLU-MEDROL) injection  40 mg Intravenous Q6H  . metoprolol tartrate  12.5 mg Oral BID  . midodrine  10 mg Oral TID WC  . pantoprazole  40 mg Oral Daily  . potassium chloride  20 mEq Oral Daily  . rivaroxaban  15 mg Oral Q supper  . sodium chloride flush  3 mL Intravenous Q12H   Continuous Infusions: . sodium chloride     PRN Meds:.sodium chloride, acetaminophen, haloperidol lactate, ondansetron (ZOFRAN) IV, sodium chloride flush Past Medical History:  Diagnosis Date  . Alzheimer's dementia ([place])   . CAD (coronary artery disease)    a. 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 Med rx recommended, EF 30-35% at that cath.  . CHF (congestive heart failure) ([place])   . CKD (chronic kidney disease), stage III ([place])   . Essential hypertension   . ILD (interstitial lung disease) ([place])   . Memory loss   . Non-ischemic cardiomyopathy ([place])    EF 35%  . NSVT (nonsustained ventricular tachycardia) ([name])    Negative EP study  . On home O2    PRN during day, continuous QHS  .  Orthostatic hypotension   . Paroxysmal atrial fibrillation ([name])   . Secondary cardiomyopathy ([place])    LVEF 40-45% January 2014  . Syncope   . TIA (transient ischemic attack)   . Uses walker    Past Surgical History:  Procedure Laterality Date  . CARDIOVERSION N/A 10/19/2016   Procedure: CARDIOVERSION;  Surgeon: [name], MD;  Location: [facility];  Service: Cardiovascular;  Laterality: N/A;  . CATARACT EXTRACTION    . COLONOSCOPY  unknown  . COLONOSCOPY N/A 09/05/2014   Procedure: COLONOSCOPY;  Surgeon: [name], MD;  Location: [facility];  Service: Endoscopy;  Laterality: N/A;  145pm  . ELECTROPHYSIOLOGY STUDY N/A 08/05/2014   Procedure: ELECTROPHYSIOLOGY STUDY;  Surgeon: Deboraha Sprang, MD;  Location: [facility] CATH LAB;  Service: Cardiovascular;  Laterality: N/A;  . LAPAROSCOPIC APPENDECTOMY  November 2015   Dr. [name]  . LEFT HEART CATHETERIZATION WITH CORONARY ANGIOGRAM N/A 07/31/2014   Procedure: LEFT HEART CATHETERIZATION WITH CORONARY ANGIOGRAM;  Surgeon: [name], MD;  Location: [facility] CATH LAB;  Service: Cardiovascular;  Laterality: N/A;  . LOOP RECORDER IMPLANT    . LOOP RECORDER REMOVAL  11/09/2017   MDT LINQ loop recorder removed for end of battery life of the device by Dr Rayann Heman   Social History   Socioeconomic History  . Marital status: Married    Spouse name: Not on file  . Number of children: Not on file  .  Years of education: 75  . Highest education level: Not on file  Occupational History  . Occupation: Retired  Scientific laboratory technician  . Financial resource strain: Not on file  . Food insecurity:    Worry: Not on file    Inability: Not on file  . Transportation needs:    Medical: Not on file    Non-medical: Not on file  Tobacco Use  . Smoking status: Never Smoker  . Smokeless tobacco: Never Used  Substance and Sexual Activity  . Alcohol use: No    Alcohol/week: 0.0 standard drinks  . Drug use: No  . Sexual activity: Not on file  Lifestyle   . Physical activity:    Days per week: Not on file    Minutes per session: Not on file  . Stress: Not on file  Relationships  . Social connections:    Talks on phone: Not on file    Gets together: Not on file    Attends religious service: Not on file    Active member of club or organization: Not on file    Attends meetings of clubs or organizations: Not on file    Relationship status: Not on file  . Intimate partner violence:    Fear of current or ex partner: Not on file    Emotionally abused: Not on file    Physically abused: Not on file    Forced sexual activity: Not on file  Other Topics Concern  . Not on file  Social History Narrative   Caffeine: 2 drinks a day    No Known Allergies  Last set of Vital Signs (not current) Vitals:   2018-05-07 1123 07-May-2018 1200  BP:  105/89  Pulse:  (!) 128  Resp:  (!) 41  Temp: (!) 97.4 F (36.3 C)   SpO2:  (!) 62%      Physical Exam On my exam the patient is unresponsive, pulseless, breathless, I assisted with bag valve mask ventilation and intubated the patient, see note below Gen: unresponsive Cardiovascular: pulseless  Resp: apneic. Breath sounds equal bilaterally with bagging  Abd: nondistended  Neuro: GCS 3, unresponsive to pain  HEENT: No blood in posterior pharynx, gag reflex absent  Neck: No crepitus  Musculoskeletal: No deformity  Skin: warm  Procedures  INTUBATION Performed by: Johnna Acosta Required items: required blood products, implants, devices, and special equipment available Patient identity confirmed: provided demographic data and hospital-assigned identification number Time out: Immediately prior to procedure a "time out" was called to verify the correct patient, procedure, equipment, support staff and site/side marked as required. Indications: Apnea Intubation method: Glide scope Preoxygenation: BVM Sedatives: None Paralytic: None Tube Size: 7.5 cuffed Post-procedure assessment: chest rise and  ETCO2 monitor Breath sounds: equal and absent over the epigastrium Tube secured by Respiratory Therapy Patient tolerated the procedure well with no immediate complications.    Medical Decision making  The patient is currently being cared for by the hospitalist, Dr. Manuella Ghazi who is directing the resuscitation.  I assisted with intubation, successfully into the patient, clinically the patient had bilateral breath sounds with assisted ventilation, there was good color change of the end-tidal CO2 and there was good fogging of the tube.  There was no sounds over the stomach.  See the rest of the note and resuscitation by hospitalist for further details.     Noemi Chapel, MD 05-07-2018 1409

## 2018-05-31 NOTE — Code Documentation (Addendum)
1340 this nurse and stephanie in with pt. Per wife, pt kept trying to take mask off. Pt had BM and was washed up. Pt placed on HF 15 L and turned to right side per pt's request. 0211 nurse noted pt HR dropped to 50-60's. This nurse and Nelta Numbers, RN in to assess pt. Pt noted to not be responsive and HR continue to drop. Code blue activated. Code cart brought into room and pads placed. 1358 CPR started. 1402 1 mg Epi given. 1403 pt intubated. 1405 1 mg Epi given and pulse check 1408 CPR restarted and 1 mg epi given. 1410 pulse return but thready. 1411 CPR restarted. 1412 1 mg epi given continue cpr. 1552 another 1 mg epi given and pulse check. Continue CPR.  1417 pulse present but still thready, pt noted to have wide-complex tachyarrhythmia with HR in 140's - 150's when there was ROSC achieved. Chest compressions stopped and 150 mg amiodarone administered at 1420. Blood pressure continued to drop and pulse continued to be weak and thready. Dr Manuella Ghazi spoke with wife and daughter regarding code status. Family agreed for no further chest compressions. Pt pronounced dead at 1433 by Dr. Manuella Ghazi.

## 2018-05-31 NOTE — Progress Notes (Signed)
Patient placement informed of pt's family ready to call funeral home.

## 2018-05-31 NOTE — Progress Notes (Signed)
Daily Progress Note   Patient Name: Alejandro [name]       Date: May 21, 2018 DOB: 08-21-1932  Age: 85 y.o. MRN#: [id number] Attending Physician: Rodena Goldmann, DO Primary Care Physician: Glenda Chroman, MD Admit Date: 05/15/2018  Reason for Consultation/Follow-up: Establishing goals of care  Subjective: Patient drowsy but will wake to voice. Currently tolerating BiPAP.   GOC:   Follow-up with wife Alejandro [name]) this AM with Dr. Manuella Ghazi and then again this afternoon.   Discussed events leading up to hospitalization, course of hospitalization including diagnoses and interventions, and unfortunate poor prognosis with irreversible lung disease. Alejandro [name] understands the lung damage that has been done (from his career as a Airline pilot) cannot be reversed but shares her wishes that it was found sooner. She speaks of him being very "active" throughout his life and "this isn't him."   Dr. Manuella Ghazi discussed limitations to care. This afternoon, I further reiterated Alejandro [name] to consider limitations to care including no resuscitation or life support with fear that these interventions would cause more pain and suffering at the end of his life. Discussed concern with challenges of getting him off a ventilator if it got to that point. Discussed poor outcomes of CPR with underlying age, frailty, and chronic co-morbidities. Again encouraged her to read Hard Choices booklet I gave on Tuesday.  Discussed continued aggressive interventions including ? LTACH versus comfort measures in order to allow comfort, peace, and dignity at EOL. Discussed role of symptom management to ensure relief from pain and suffering. Introduced hospice philosophy and options. Alejandro [name] does speak of the importance of getting Alejandro [name] back home. She speaks of Christmas  being his favorite holiday and she has worked diligently to get the house decorated for when he does come home.   Alejandro [name] is "overwhelmed" and not ready to make decisions today. She does acknowledge that Alejandro [name] does not understand complexity of current condition and that she is faced with big decisions regarding his care. Alejandro [name] plans to further discuss code status and hospice with her daughters. I attempted to arrange family meeting for tomorrow but Alejandro [name] tells me her daughters will be unavailable. Encouraged Alejandro [name] to continue discussions with family and care team.   Alejandro [name] is very tearful throughout my visit. She speaks of this being his "worst day" and the worst she has ever seen  him. She acknowledges Alejandro [name] is very sick. Marqus told her earlier he "wasn't going to make it." In conclusion, Alejandro [name] states "maybe he will get better" and shares that she continues to pray that he will get better. Therapeutic listening and emotional/spiritual support provided.     Length of Stay: 3  Current Medications: Scheduled Meds:  . ipratropium-albuterol  3 mL Nebulization Q6H  . levothyroxine  25 mcg Oral QAC breakfast  . mouth rinse  15 mL Mouth Rinse BID  . methylPREDNISolone (SOLU-MEDROL) injection  40 mg Intravenous Q6H  . metoprolol tartrate  12.5 mg Oral BID  . midodrine  10 mg Oral TID WC  . pantoprazole  40 mg Oral Daily  . potassium chloride  20 mEq Oral Daily  . rivaroxaban  15 mg Oral Q supper  . sodium chloride flush  3 mL Intravenous Q12H    Continuous Infusions: . sodium chloride      PRN Meds: sodium chloride, acetaminophen, haloperidol lactate, ondansetron (ZOFRAN) IV, sodium chloride flush  Physical Exam  Constitutional: He is easily aroused. He appears ill.  HENT:  Head: Normocephalic and atraumatic.  Cardiovascular: An irregularly irregular rhythm present.  Pulmonary/Chest: No accessory muscle usage. No tachypnea. No respiratory distress.  BiPAP  Abdominal: There is no tenderness.    Neurological: He is alert and easily aroused.  Alert on BiPAP. Periods of pleasant confusion.  Skin: Skin is warm and dry.  Nursing note and vitals reviewed.          Vital Signs: BP 92/75   Pulse 100   Temp (!) 97.3 F (36.3 C) (Oral)   Resp 18   Ht 5\' 8"  (1.727 m)   Wt 73.6 kg   SpO2 96%   BMI 24.67 kg/m  SpO2: SpO2: 96 % O2 Device: O2 Device: High Flow Nasal Cannula O2 Flow Rate: O2 Flow Rate (L/min): 13 L/min  Intake/output summary:   Intake/Output Summary (Last 24 hours) at 27-May-2018 1038 Last data filed at 05-27-18 0850 Gross per 24 hour  Intake 63 ml  Output -  Net 63 ml   LBM: Last BM Date: 05/02/18 Baseline Weight: Weight: 81.6 kg Most recent weight: Weight: 73.6 kg       Palliative Assessment/Data: PPS 40%   Flowsheet Rows     Most Recent Value  Intake Tab  Referral Department  Hospitalist  Unit at Time of Referral  ICU  Palliative Care Primary Diagnosis  Pulmonary  Palliative Care Type  New Palliative care  Date first seen by Palliative Care  05/02/18  Clinical Assessment  Palliative Performance Scale Score  40%  Psychosocial & Spiritual Assessment  Palliative Care Outcomes  Patient/Family meeting held?  Yes  Who was at the meeting?  patient and wife  Palliative Care Outcomes  Clarified goals of care, Provided end of life care assistance, Provided psychosocial or spiritual support, ACP counseling assistance      Patient Active Problem List   Diagnosis Date Noted  . Palliative care by specialist   . Goals of care, counseling/discussion   . Hypoxia   . Interstitial lung disease ([place])   . NSTEMI (non-ST elevated myocardial infarction) ([place])   . Acute on chronic respiratory failure ([place]) 05/06/2018  . Acute on chronic respiratory failure with hypoxemia ([place]) 03/15/2018  . IPF (idiopathic pulmonary fibrosis) ([place]) 03/15/2018  . HCAP (healthcare-associated pneumonia) 03/15/2018  . Acute on chronic systolic HF (heart failure) ([place]) 02/01/2018   . Hypothyroidism 01/31/2018  . CHF (congestive heart failure) ([place]) 01/31/2018  .  Acute on chronic combined systolic and diastolic CHF (congestive heart failure) ([place]) 10/14/2016  . AF (paroxysmal atrial fibrillation) ([place]) 10/14/2016  . Acute respiratory failure with hypoxia ([place]) 10/14/2016  . CKD (chronic kidney disease) stage 3, GFR 30-59 ml/min ([name]) 10/14/2016  . Heme positive stool 08/06/2016  . Orthostatic hypotension 12/25/2014  . Right sided weakness 12/25/2014  . HLD (hyperlipidemia) 12/25/2014  . Hypotension 12/25/2014  . AKI (acute kidney injury) ([place]) 12/25/2014  . History of colonic polyps   . Rectal bleeding 09/03/2014  . Chronic anticoagulation 09/03/2014  . Chest tightness 08/13/2014  . Chest pain 08/13/2014  . Dizziness 08/13/2014  . Episodic lightheadedness   . NSVT (nonsustained ventricular tachycardia) ([place]) 08/05/2014  . Congestive dilated cardiomyopathy ([place]) 08/05/2014  . Syncope 08/05/2014  . Atrial fibrillation with RVR ([place]) 07/30/2014  . Secondary cardiomyopathy ([place]) 06/04/2014  . Essential hypertension 05/02/2013    Palliative Care Assessment & Plan   Patient Profile: 85 y.o. male  with past medical history of interstitial lung disease on intermittent home oxygen, nonischemic cardiomyopathy, nonobstructive CAD, CKD stage 3, orthostatic hypotension on midodrine, atrial fibrillation on Xarelto, progressive dementia admitted on 05/15/2018 with shortness of breath and lip cyanosis. Per wife, patient with increase in SOB for 3-4 days with oxygen saturation in 50's. Wife increased oxygen but sats remained in mid 23's. In ED, patient found to be in acute respiratory distress, afib RVR, soft BP's (chronic), elevated BNP, vascular congestion and alveolar edema on CXR. Currently requiring HFNC. Cardiology consulted. ECHO in 01/2018 revealed LVEF 35% with diffuse hypokinesis. Palliative medicine consultation for goals of care.   Assessment: Acute on chronic  respiratory failure Interstitial lung disease Acute on chronic systolic heart failure Afib RVR CKD stage III Hx orthostatic hypotension [place]and ischemia/NSTEMI  Recommendations/Plan:  Follow-up GOC with Dr. Manuella Ghazi and wife. Wife is overwhelmed and not ready to make decisions today regarding limitations of care (DNR/DNI). She wishes to further discuss with her daughters. Attempted to arrange family meeting for tomorrow but wife tells me daughters are unavailable. Educated on medical recommendation for DNR/DNI with underlying irreversible ILD, age, frailty, and poor outcomes of CPR. Poor prognosis was discussed.   Discussed continued aggressive pathway vs. Comfort focused care and initiation of hospice services. Again, wife is not ready to make decisions today. She does speak of importance of getting Hus[place] back home.   Will need ongoing [place] discussions with care team and family.     Code Status: FULL   Code Status Orders  (From admission, onward)         Start     Ordered   05/29/2018 1452  Full code  Continuous     05/02/2018 1454        Code Status History    Date Active Date Inactive Code Status Order ID Comments User Context   03/15/2018 1853 03/21/2018 1647 Full Code [id number]  Erline Hau, MD Inpatient   01/31/2018 1237 02/05/2018 2351 Full Code [id number]  Heath Lark D, DO ED   10/14/2016 1824 10/21/2016 1830 Full Code [id number]  Orson Eva, MD Inpatient   12/25/2014 2133 12/26/2014 1421 Full Code [id number]  Waldemar Dickens, MD Inpatient   08/13/2014 0310 08/13/2014 2109 Full Code [id number]  Oswald Hillock, MD Inpatient   08/05/2014 1706 08/06/2014 1502 Full Code [id number]  Deboraha Sprang, MD Inpatient   07/31/2014 1752 08/05/2014 1706 Full Code [id number]  [name], MD Inpatient   07/30/2014 1552 07/31/2014 1752 Full Code [id number]  Isaac Bliss, Rayford Halsted, MD Inpatient    Advance Directive Documentation     Most Recent Value  Type of Advance Directive  Living will    Pre-existing out of facility DNR order (yellow form or pink MOST form)  -  "MOST" Form in Place?  -       Prognosis:   Poor prognosis with acute on chronic respiratory failure secondary to ILD and acute on chronic heart failure. Patient requiring HFNC and intermittent BiPAP.  Discharge Planning:  To Be Determined  Care plan was discussed with patient, wife, RN, Dr. Manuella Ghazi  Thank you for allowing the Palliative Medicine Team to assist in the care of this patient.   Time In: 1010- 1240- Time Out: [id number] Total Time 60 Prolonged Time Billed  no      Greater than 50%  of this time was spent counseling and coordinating care related to the above assessment and plan.  Ihor Dow, DNP, FNP-C Palliative Medicine Team  Phone: [phone] Fax: [phone]  Please contact Palliative Medicine Team phone at [phone] for questions and concerns.

## 2018-05-31 NOTE — Progress Notes (Signed)
PROGRESS NOTE    Alejandro [name]  YJE:[id number] DOB: 22-Sep-1932 DOA: 05/03/2018 PCP: Glenda Chroman, MD   Brief Narrative:  85 y.o.malewith PMH significant for dementia, systolic CHF, non obstructive CAD, stage 3 CKD, atrial fibrillation on xarelto,HTN and chronic resp failure due to IPF; who presented to ED complaining of SOB and lips cyanosis. Patient is confused at baseline and hx mainly provided by his wife. She reported that patient has had increase SOB for the last 3-4 days and has experienced DOE. She noticed even some purplish changes on his lips. O2 sat was checked at home by wife and was in the 50%; despite increase in supplementation his O2 sat remains in the mid 80's%  Patient himself denies CP, palpitations, fever, chills,nausea, vomiting, cough, dysuria, hematuria, melena or hematochezia.   In the ED he was found to be in acute resp distress and on A. Fib with mild RVR. BP was soft (but he chronically has low BP); elevated BNP, vascular congestion and alveolar edema on CXR; patient also had troponin of 0.37.  He has been admitted with acute on chronic respiratory failure in the setting of ILD progression as well as some heart failure.  He continues to have a poor prognosis and remains on intermittent BiPAP as well as high flow nasal cannula.  Family members continue to insist on aggressive care despite his poor prognosis.   Assessment & Plan:   Principal Problem:   Acute on chronic respiratory failure ([name]) Active Problems:   Atrial fibrillation with RVR ([name])   Congestive dilated cardiomyopathy ([name])   HLD (hyperlipidemia)   CKD (chronic kidney disease) stage 3, GFR 30-59 ml/min ([name])   Hypothyroidism   Acute on chronic systolic HF (heart failure) ([place])   Palliative care by specialist   Goals of care, counseling/discussion   Hypoxia   Interstitial lung disease ([place])   NSTEMI (non-ST elevated myocardial infarction) ([place])  1-acute on chronic respiratory failure in  the setting of further progression of his ILD and acute systolic heart failure-persistent -Diuretics withheld as this time as creatinine is becoming elevated at 1.7 this morning.  Continue to monitor I's and O's with repeat BMP in a.m. -Patient will have increased steroids to assist with his oxygen demand from a ILD standpoint per Pulmonology; reviewed ABG on 12/4 with no need for intubation at this time. -Duonebs q6 hours -Try to wean bipap as tolerated -Continue oxygen supplementation and wean down as tolerated -Follow cardiology recommendations -Palliative care has tried to have discussion with goals of care, but family insists on aggressive care at this time.  We will continue further discussions today with family members once available. -Continue daily weights, strict I's and O's and low-sodium diet. -Continue low-dose of metoprolol -Most recent ejection fraction in September 2019 35%. -Troponin has trended down and is reflective of NSTEMI type II  2-atrial fibrillation with RVR-resolved -Heart rate is slightly better control but is still irregular -Continue metoprolol -Continue Xarelto -Continue telemetry monitoring.  3-hyperlipidemia -continue statins  4-chronic kidney disease -Stage III at baseline -Has elevated today to 1.7 and therefore stopped diuresis -Will continue to follow trend with repeat BMP in a.m.  5-hypothyroidism -Continue Synthroid -TSH within normal limits  6-hypokalemia-resolved -Repleted and within normal limits currently -Follow electrolytes trend -Magnesium within normal limits.  7-GERD -Continue PPI  8-history of orthostatic hypotension -We will continue the use of midodrine due to soft blood pressure readings  9-elevated troponin -In the setting of demand ischemia/NSTEMI -Patient denies chest pain -  cycling hasdemonstrated trending downtroponin -Not really a candidate for intervention -Continue treatment for heart  failure -Continue beta-blocker, aspirin and statins. -Follow cardiology recommendations.  DVT prophylaxis:Xarelto Code Status:Full code Family Communication:Discussed with wife and grandson at bedside yesterday Disposition Plan:Remains in a stepdown, continue  breathing treatments and steroids with diuresis discontinued due to renal intolerance.  Cardiology and pulmonology following and appreciate further recommendations.  Patient really needs further discussion with family members regarding need for discharge and hospice care and if they are determined to remain aggressive, may even require LTAC placement.  Consultants:   Cardiology  Pulmonology  Palliative care  Procedures:   None  Antimicrobials:   None  Subjective: Patient seen and evaluated today with no new acute complaints or concerns. No acute concerns or events noted overnight.  His FiO2 on BiPAP has diminished to 80% this morning.  Objective: Vitals:   May 28, 2018 0500 2018-05-28 0600 05-28-18 0700 05-28-18 0756  BP:  (!) 82/66 92/75   Pulse: (!) 111 (!) 104 100   Resp: 19 17 18    Temp:    (!) 97.3 F (36.3 C)  TempSrc:    Oral  SpO2: (!) 73% (!) 83% 96%   Weight:      Height:        Intake/Output Summary (Last 24 hours) at 05-28-2018 0915 Last data filed at 05-28-2018 0850 Gross per 24 hour  Intake 66 ml  Output -  Net 66 ml   Filed Weights   05/02/18 0300 2018-05-28 0255 28-May-2018 0347  Weight: 74.4 kg 75.1 kg 73.6 kg    Examination:  General exam: Appears calm and comfortable  Respiratory system: Clear to auscultation. Respiratory effort normal.  Currently on BiPAP FiO2 80%. Cardiovascular system: S1 & S2 heard, RRR. No JVD, murmurs, rubs, gallops or clicks. No pedal edema. Gastrointestinal system: Abdomen is nondistended, soft and nontender. No organomegaly or masses felt. Normal bowel sounds heard. Central nervous system: Alert and oriented.  Extremities: Symmetric 5 x 5 power. Skin: No  rashes, lesions or ulcers Psychiatry: Cannot be evaluated. Foley catheter with clear, yellow, urine output noted.    Data Reviewed: I have personally reviewed following labs and imaging studies  CBC: Recent Labs  Lab 05/16/2018 1022 05-28-2018 0415  WBC 13.0* 16.0*  NEUTROABS 9.6*  --   HGB 10.9* 11.5*  HCT 35.2* 37.8*  MCV 97.8 96.7  PLT 304 696   Basic Metabolic Panel: Recent Labs  Lab 05/02/2018 1022 05/02/18 0351 05/03/18 0418 05/28/2018 0415  NA 139 137 137 136  K 3.2* 3.7 3.8 4.3  CL 108 106 106 102  CO2 20* 21* 21* 22  GLUCOSE 140* 134* 120* 147*  BUN 22 32* 47* 55*  CREATININE 1.36* 1.31* 1.38* 1.71*  CALCIUM 8.4* 8.5* 8.4* 8.6*  MG 2.0  --   --  2.4   GFR: Estimated Creatinine Clearance: 31.7 mL/min (A) (by C-G formula based on SCr of 1.71 mg/dL (H)). Liver Function Tests: Recent Labs  Lab 05/03/18 0800  ALBUMIN 2.8*   No results for input(s): LIPASE, AMYLASE in the last 168 hours. No results for input(s): AMMONIA in the last 168 hours. Coagulation Profile: No results for input(s): INR, PROTIME in the last 168 hours. Cardiac Enzymes: Recent Labs  Lab 04/30/2018 1022 05/02/18 0351  TROPONINI 0.37* 0.29*   BNP (last 3 results) No results for input(s): PROBNP in the last 8760 hours. HbA1C: No results for input(s): HGBA1C in the last 72 hours. CBG: No results for input(s): GLUCAP  in the last 168 hours. Lipid Profile: No results for input(s): CHOL, HDL, LDLCALC, TRIG, CHOLHDL, LDLDIRECT in the last 72 hours. Thyroid Function Tests: Recent Labs    05/21/2018 1022  TSH 1.642   Anemia Panel: No results for input(s): VITAMINB12, FOLATE, FERRITIN, TIBC, IRON, RETICCTPCT in the last 72 hours. Sepsis Labs: No results for input(s): PROCALCITON, LATICACIDVEN in the last 168 hours.  Recent Results (from the past 240 hour(s))  MRSA PCR Screening     Status: None   Collection Time: 05/28/2018  3:18 PM  Result Value Ref Range Status   MRSA by PCR NEGATIVE  NEGATIVE Final    Comment:        The GeneXpert MRSA Assay (FDA approved for NASAL specimens only), is one component of a comprehensive MRSA colonization surveillance program. It is not intended to diagnose MRSA infection nor to guide or monitor treatment for MRSA infections. Performed at [facility], [address], [place], [place] [address]          Radiology Studies: Dg Chest [facility] 1 View  Result Date: 05/03/2018 CLINICAL DATA:  Dyspnea. Decreased oxygen saturation. EXAM: PORTABLE CHEST 1 VIEW COMPARISON:  05/24/2018 FINDINGS: The cardiac silhouette remains enlarged. Diffuse interstitial and patchy airspace opacities throughout both lungs have not significantly changed. No sizable pleural effusion or pneumothorax is identified. IMPRESSION: Unchanged diffuse bilateral lung opacities which may reflect edema superimposed on chronic interstitial lung disease. Electronically Signed   By: Logan Bores M.D.   On: 05/03/2018 08:02        Scheduled Meds: . ipratropium-albuterol  3 mL Nebulization Q6H  . levothyroxine  25 mcg Oral QAC breakfast  . mouth rinse  15 mL Mouth Rinse BID  . methylPREDNISolone (SOLU-MEDROL) injection  40 mg Intravenous Q6H  . metoprolol tartrate  12.5 mg Oral BID  . midodrine  10 mg Oral TID WC  . pantoprazole  40 mg Oral Daily  . potassium chloride  20 mEq Oral Daily  . rivaroxaban  15 mg Oral Q supper  . sodium chloride flush  3 mL Intravenous Q12H   Continuous Infusions: . sodium chloride       LOS: 3 days    Time spent: 30 minutes    Luetta Piazza Darleen Crocker, DO [facility] Pager [phone]  If 7PM-7AM, please contact night-coverage [email or web address] Password [email or web address] 05/16/18, 9:15 AM

## 2018-05-31 NOTE — Progress Notes (Signed)
Patient currently off BIPAP at this time and back on HFNC. Unit on standby if needed.

## 2018-05-31 NOTE — Discharge Summary (Signed)
Physician Discharge Summary  Alejandro [name] DQQ:[id number] DOB: 06/10/1932 DOA: 05/10/2018  PCP: Glenda Chroman, MD  Admit date: 05/30/2018  Discharge date: 2018/06/03  Admitted From:Home  Disposition:  Expired after cardiac arrest on 03-Jun-2018 at 1433   Brief/Interim Summary: Per HPI: 85 y.o.malewith PMH significant for dementia, systolic CHF, non obstructive CAD, stage 3 CKD, atrial fibrillation on xarelto,HTN and chronic resp failure due to IPF; who presented to ED complaining of SOB and lips cyanosis. Patient is confused at baseline and hx mainly provided by his wife. She reported that patient has had increase SOB for the last 3-4 days and has experienced DOE. She noticed even some purplish changes on his lips. O2 sat was checked at home by wife and was in the 50%; despite increase in supplementation his O2 sat remains in the mid 80's%  Patient himself denies CP, palpitations, fever, chills,nausea, vomiting, cough, dysuria, hematuria, melena or hematochezia.   In the ED he was found to be in acute resp distress and on A. Fib with mild RVR. BP was soft (but he chronically has low BP); elevated BNP, vascular congestion and alveolar edema on CXR; patient also had troponin of 0.37.  The patient had been admitted with acute on chronic respiratory failure in the setting of ILD progression as well as some heart failure.  He was noted to require intermittent use of BiPAP with high amounts of oxygen with FiO2 80 to 100%.  He was noted to have poor prognosis and this was discussed with family members via multiple specialties to include pulmonology and cardiology as well as palliative care.  Family members had insisted that patient should remain full code and be placed on life support as needed.  Patient continued to remain hypoxemic despite best efforts with ongoing duo nebs as well as IV steroids and IV diuresis.  He unfortunately underwent a cardiac arrest with noted pulseless electrical activity at  approximately 1355 on 2018-06-03.  Attempt for CPR was performed and there was return of spontaneous circulation at 1417.  Unfortunately, patient became pulseless once again and was pronounced expired at 1433 after family members and decided that it would be best not to pursue any further resuscitative efforts.   Discharge Diagnoses:  Principal Problem:   Acute on chronic respiratory failure ([name]) Active Problems:   Atrial fibrillation with RVR ([name])   Congestive dilated cardiomyopathy ([name])   HLD (hyperlipidemia)   CKD (chronic kidney disease) stage 3, GFR 30-59 ml/min ([name])   Hypothyroidism   Acute on chronic systolic HF (heart failure) ([name])   Palliative care by specialist   Goals of care, counseling/discussion   Hypoxia   Interstitial lung disease ([place])   NSTEMI (non-ST elevated myocardial infarction) [facility])  Principal discharge diagnosis: Cardiopulmonary respiratory arrest with associated hypoxemic respiratory failure.  No Known Allergies  Consultations:  Palliative Care  Pulmonlology  Cardiology   Procedures/Studies: Dg Chest Port 1 View  Result Date: 05/03/2018 CLINICAL DATA:  Dyspnea. Decreased oxygen saturation. EXAM: PORTABLE CHEST 1 VIEW COMPARISON:  05/06/2018 FINDINGS: The cardiac silhouette remains enlarged. Diffuse interstitial and patchy airspace opacities throughout both lungs have not significantly changed. No sizable pleural effusion or pneumothorax is identified. IMPRESSION: Unchanged diffuse bilateral lung opacities which may reflect edema superimposed on chronic interstitial lung disease. Electronically Signed   By: Logan Bores M.D.   On: 05/03/2018 08:02   Dg Chest Portable 1 View  Result Date: 05/29/2018 CLINICAL DATA:  Shortness of breath and cyanosis EXAM: PORTABLE CHEST 1 VIEW  COMPARISON:  03/15/2018 FINDINGS: Cardiac shadow is enlarged but stable. Increased pulmonary vascular congestion is noted with mild alveolar edema bilaterally. No focal confluent  infiltrate or sizable effusion is noted. No bony abnormality is seen. IMPRESSION: Changes consistent with CHF. Electronically Signed   By: Inez Catalina M.D.   On: 05/10/2018 09:51     Discharge Exam: Vitals:   05/10/2018 1123 05-10-18 1200  BP:  105/89  Pulse:  (!) 128  Resp:  (!) 41  Temp: (!) 97.4 F (36.3 C)   SpO2:  (!) 62%   Vitals:   May 10, 2018 1000 05/10/18 1100 2018-05-10 1123 May 10, 2018 1200  BP: 97/75 118/63  105/89  Pulse: (!) 122 (!) 125  (!) 128  Resp: (!) 27 (!) 25  (!) 41  Temp:   (!) 97.4 F (36.3 C)   TempSrc:   Oral   SpO2: (!) 55% (!) 76%  (!) 62%  Weight:      Height:        The results of significant diagnostics from this hospitalization (including imaging, microbiology, ancillary and laboratory) are listed below for reference.     Microbiology: Recent Results (from the past 240 hour(s))  MRSA PCR Screening     Status: None   Collection Time: 05/16/2018  3:18 PM  Result Value Ref Range Status   MRSA by PCR NEGATIVE NEGATIVE Final    Comment:        The GeneXpert MRSA Assay (FDA approved for NASAL specimens only), is one component of a comprehensive MRSA colonization surveillance program. It is not intended to diagnose MRSA infection nor to guide or monitor treatment for MRSA infections. Performed at [facility], [address], [place], [place] [address]      Labs: BNP (last 3 results) Recent Labs    01/31/18 1027 03/15/18 1328 05/06/2018 1022  BNP 1,122.0* 1,477.0* 6,213.0*   Basic Metabolic Panel: Recent Labs  Lab 05/11/2018 1022 05/02/18 0351 05/03/18 0418 05-10-18 0415  NA 139 137 137 136  K 3.2* 3.7 3.8 4.3  CL 108 106 106 102  CO2 20* 21* 21* 22  GLUCOSE 140* 134* 120* 147*  BUN 22 32* 47* 55*  CREATININE 1.36* 1.31* 1.38* 1.71*  CALCIUM 8.4* 8.5* 8.4* 8.6*  MG 2.0  --   --  2.4   Liver Function Tests: Recent Labs  Lab 05/03/18 0800  ALBUMIN 2.8*   No results for input(s): LIPASE, AMYLASE in the last 168 hours. No results  for input(s): AMMONIA in the last 168 hours. CBC: Recent Labs  Lab 05/17/2018 1022 05/10/18 0415  WBC 13.0* 16.0*  NEUTROABS 9.6*  --   HGB 10.9* 11.5*  HCT 35.2* 37.8*  MCV 97.8 96.7  PLT 304 287   Cardiac Enzymes: Recent Labs  Lab 05/23/2018 1022 05/02/18 0351  TROPONINI 0.37* 0.29*   BNP: Invalid input(s): POCBNP CBG: No results for input(s): GLUCAP in the last 168 hours. D-Dimer No results for input(s): DDIMER in the last 72 hours. Hgb A1c No results for input(s): HGBA1C in the last 72 hours. Lipid Profile No results for input(s): CHOL, HDL, LDLCALC, TRIG, CHOLHDL, LDLDIRECT in the last 72 hours. Thyroid function studies No results for input(s): TSH, T4TOTAL, T3FREE, THYROIDAB in the last 72 hours.  Invalid input(s): FREET3 Anemia work up No results for input(s): VITAMINB12, FOLATE, FERRITIN, TIBC, IRON, RETICCTPCT in the last 72 hours. Urinalysis    Component Value Date/Time   COLORURINE YELLOW 12/26/2014 [place] 12/26/2014 0352  LABSPEC 1.025 12/26/2014 0352   PHURINE 6.0 12/26/2014 0352   GLUCOSEU NEGATIVE 12/26/2014 0352   HGBUR NEGATIVE 12/26/2014 0352   BILIRUBINUR NEGATIVE 12/26/2014 0352   KETONESUR NEGATIVE 12/26/2014 0352   PROTEINUR NEGATIVE 12/26/2014 0352   UROBILINOGEN 0.2 12/26/2014 0352   NITRITE NEGATIVE 12/26/2014 0352   LEUKOCYTESUR SMALL (A) 12/26/2014 0352   Sepsis Labs Invalid input(s): PROCALCITONIN,  WBC,  LACTICIDVEN Microbiology Recent Results (from the past 240 hour(s))  MRSA PCR Screening     Status: None   Collection Time: 05/22/2018  3:18 PM  Result Value Ref Range Status   MRSA by PCR NEGATIVE NEGATIVE Final    Comment:        The GeneXpert MRSA Assay (FDA approved for NASAL specimens only), is one component of a comprehensive MRSA colonization surveillance program. It is not intended to diagnose MRSA infection nor to guide or monitor treatment for MRSA infections. Performed at [facility],  [address], [place], [place] [address]      Time coordinating discharge: 35 minutes  SIGNED:   Rodena Goldmann, DO [facility] 05/30/2018, 2:51 PM Pager [phone]  If 7PM-7AM, please contact night-coverage [email or web address] Password [email or web address]

## 2018-05-31 DEATH — deceased

## 2018-08-01 ENCOUNTER — Ambulatory Visit: Payer: Medicare Other | Admitting: Cardiology

## 2019-01-31 IMAGING — MR MR HEAD W/O CM
6 of 10 series · 29 of 48 positions shown · non-contrast
Comparison: CT HEAD August 27, 2016 and MRI of the head November 21, 2015

CLINICAL DATA: Dizziness, gait instability and weakness for 2
months. History of stroke, hypertension, atrial fibrillation on
chronic anti coagulation.

EXAM:
MRI HEAD WITHOUT CONTRAST
TECHNIQUE: Multiplanar, multiecho pulse sequences of the brain and surrounding
structures were obtained without intravenous contrast.

[Series 3: DWI · axial · 3.0mm · 0.72mm/px · z∈[-37,+125]mm · 7 of 55 slices shown (1 of 4)]
[im 1/55]
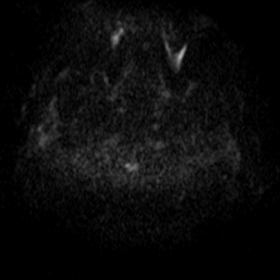
[im 10/55]
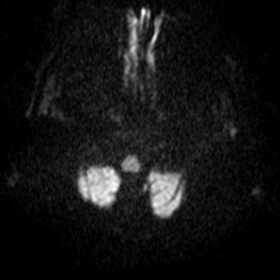
[im 19/55]
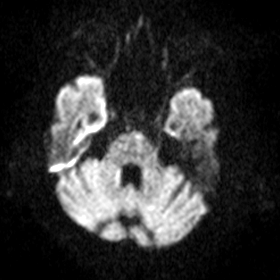
[im 28/55]
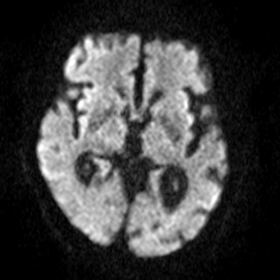
[im 37/55]
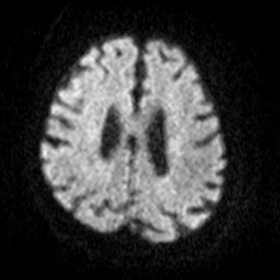
[im 46/55]
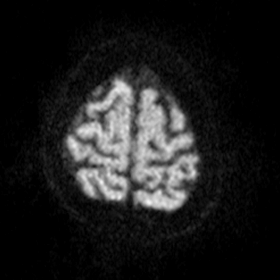
[im 55/55]
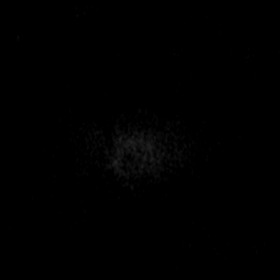

[Series 4: DWI · axial · 3.0mm · 0.74mm/px · z∈[-36,+125]mm · 6 of 55 slices shown (2 of 4)]
[im 1/55]
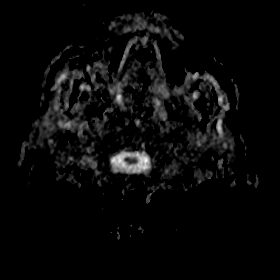
[im 11/55]
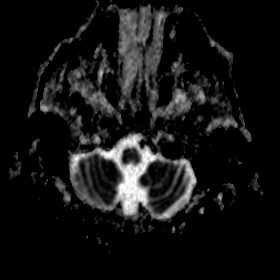
[im 22/55]
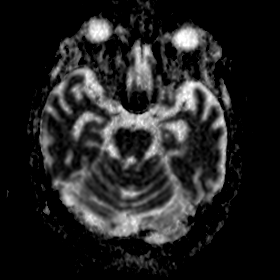
[im 33/55]
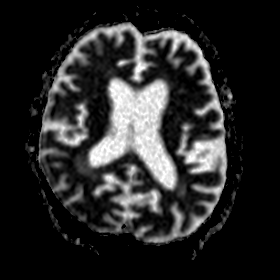
[im 44/55]
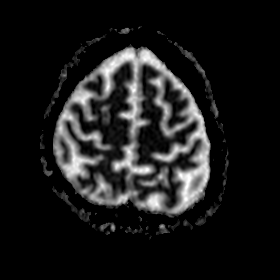
[im 55/55]
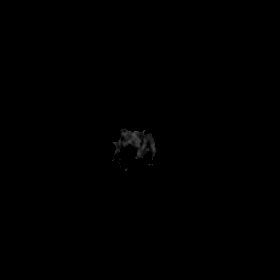

[Series 5: DWI · coronal · 5.0mm · 0.48mm/px · 4 of 38 slices shown (3 of 4)]
[im 1/38]
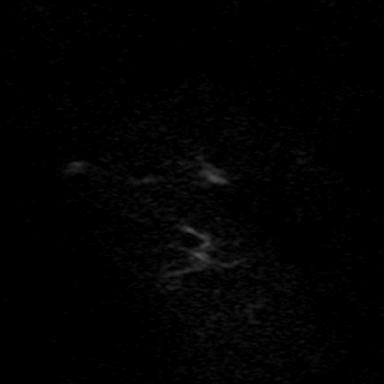
[im 13/38]
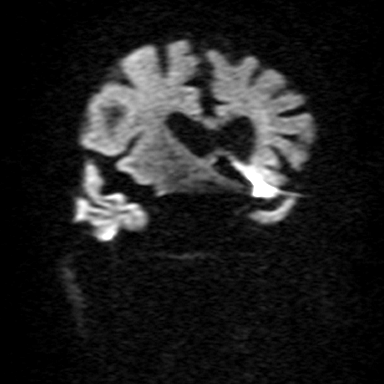
[im 25/38]
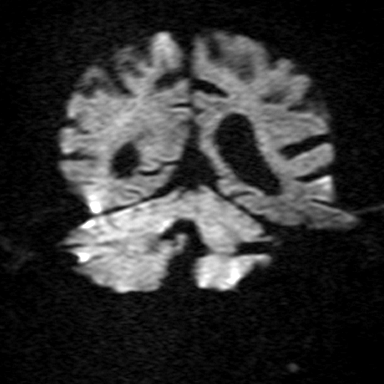
[im 38/38]
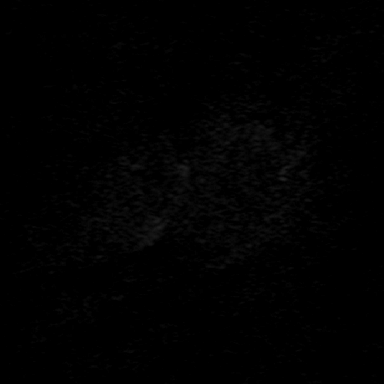

[Series 6: DWI · coronal · 5.0mm · 0.51mm/px · 4 of 38 slices shown (4 of 4)]
[im 1/38]
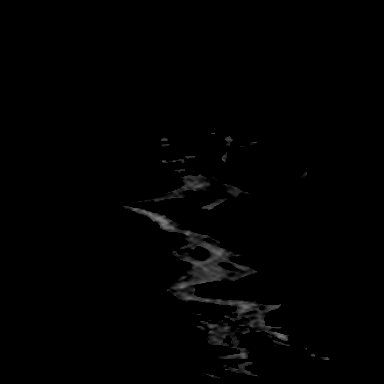
[im 13/38]
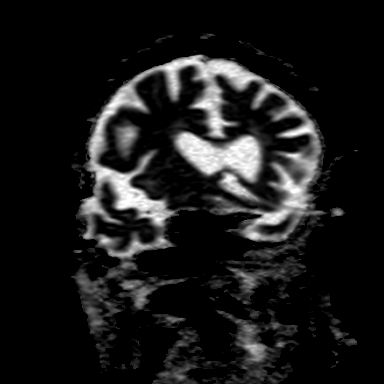
[im 25/38]
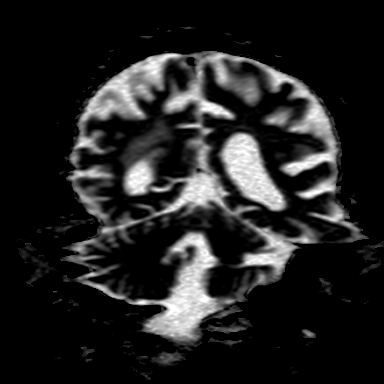
[im 38/38]
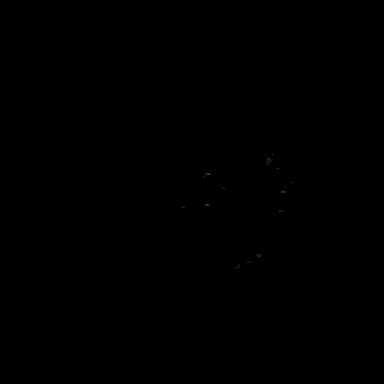

[Series 7: T2 · axial · 5.0mm · 0.45mm/px · z∈[-27,+44]mm · 2 of 23 slices shown]
[im 1/23]
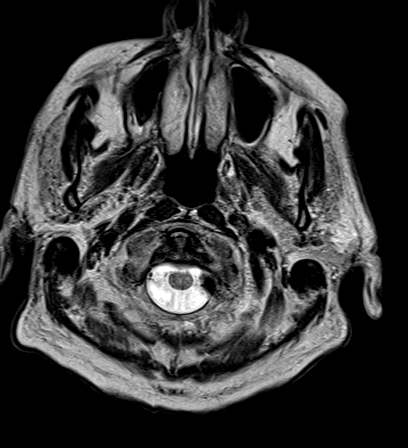
[im 12/23]
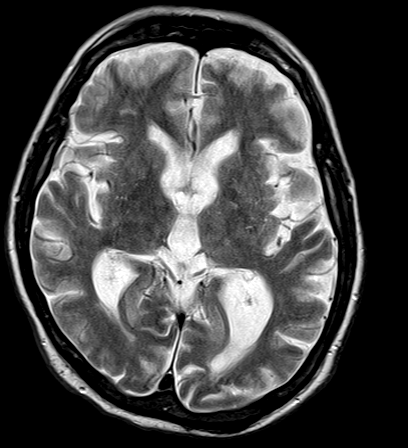

[Series 8: FLAIR · axial · 3.0mm · 0.34mm/px · z∈[-24,+114]mm · 6 of 47 slices shown]
[im 1/47]
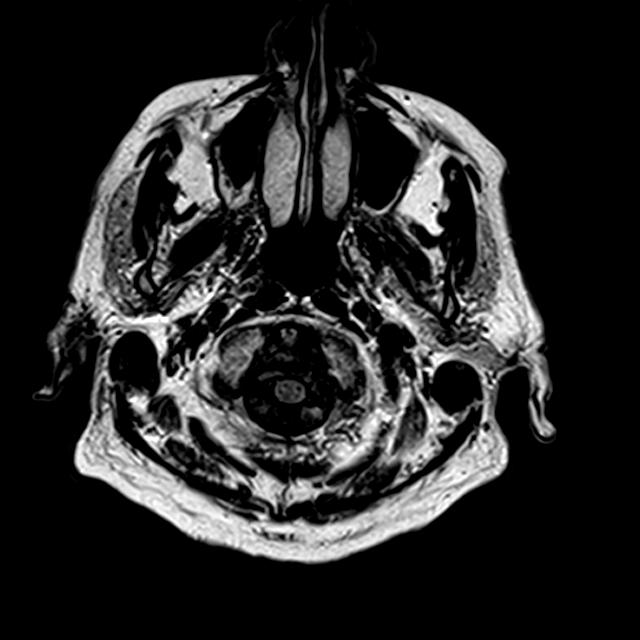
[im 10/47]
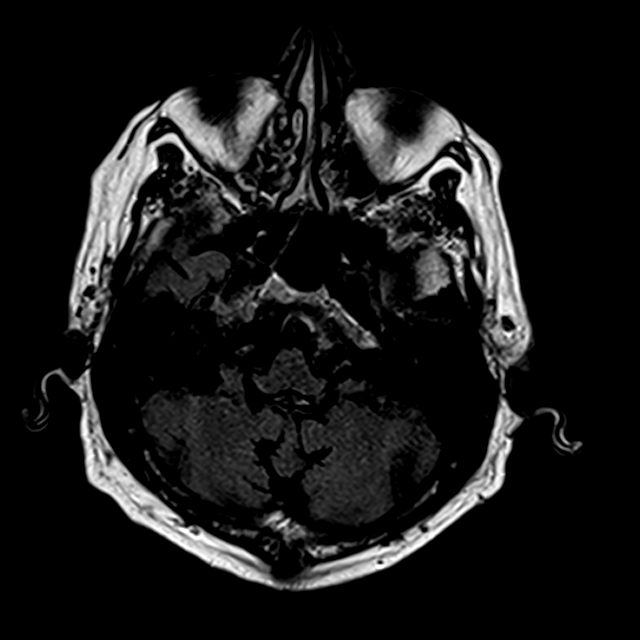
[im 19/47]
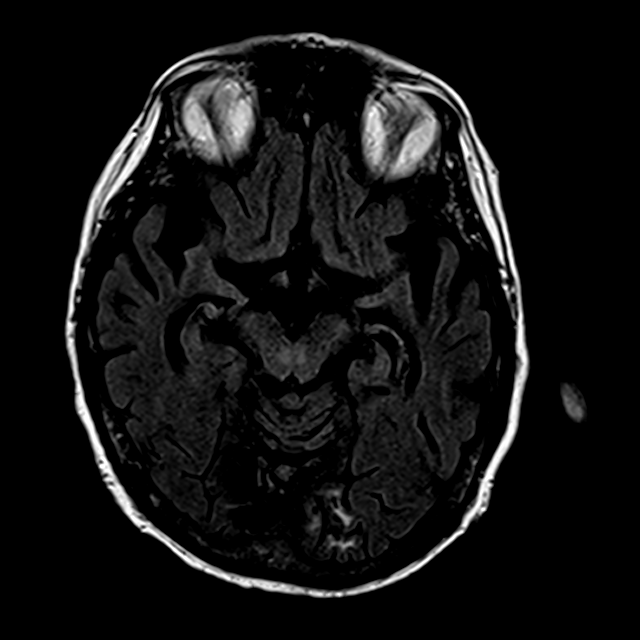
[im 28/47]
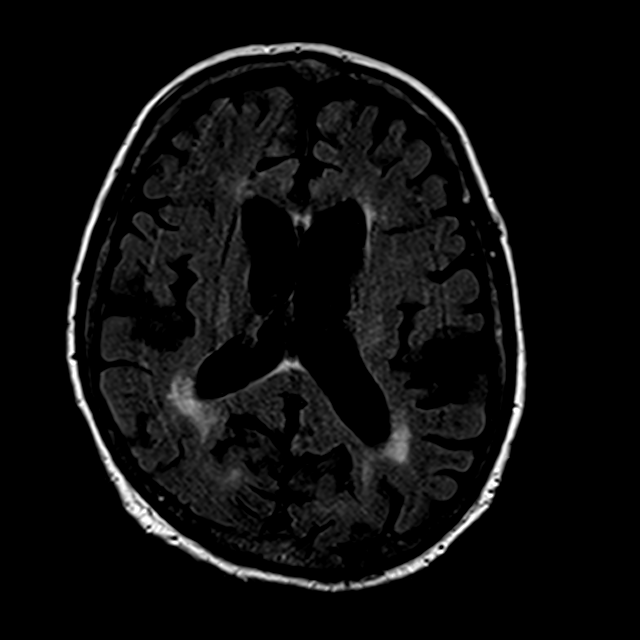
[im 37/47]
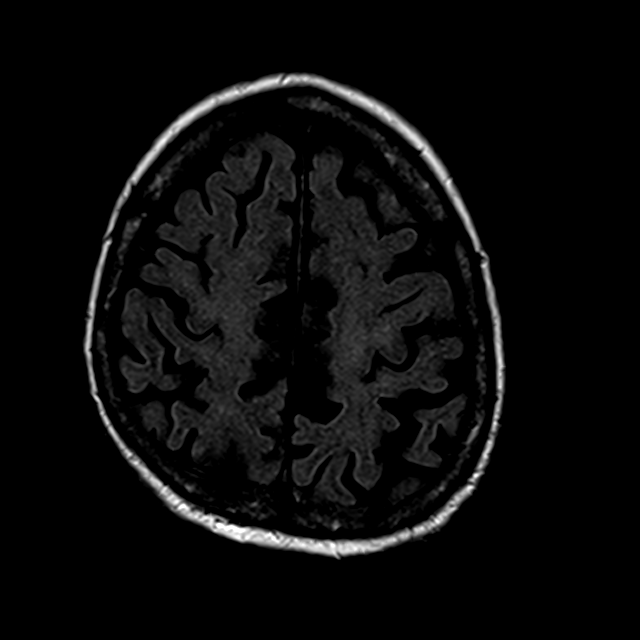
[im 47/47]
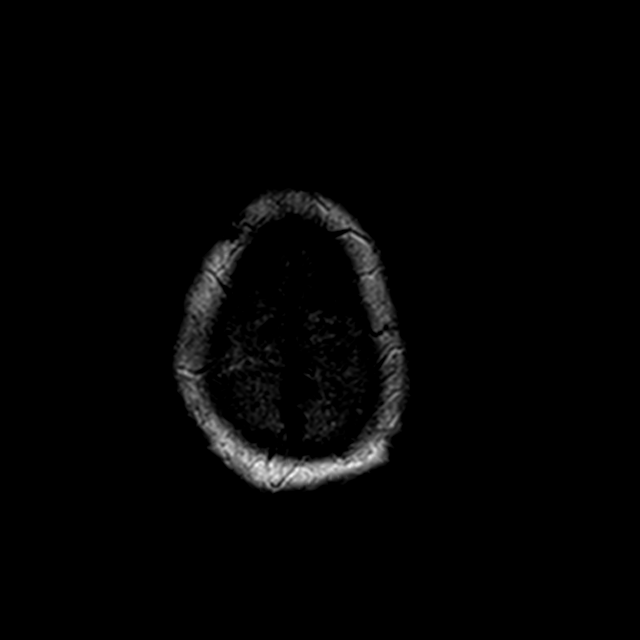

[29 of 48 positions shown; findings below may reference images not displayed]

FINDINGS: BRAIN: No reduced diffusion to suggest acute ischemia. No
susceptibility artifact to suggest hemorrhage. Moderate to severe
ventriculomegaly on the basis of global parenchymal brain volume
loss. Disproportionate midbrain and tectal volume loss with mild
FLAIR T2 hyperintense signal. Old small LEFT occipital lobe and
bilateral mesial cerebellar infarcts. Patchy to confluent
supratentorial white matter T2 hyperintensities. Tiny old LEFT
frontal cortical infarcts. No midline shift, mass effect or masses.
No abnormal extra-axial fluid collections.

VASCULAR: Normal major intracranial vascular flow voids present at
skull base, dolichoectasia associated with chronic hypertension.

SKULL AND UPPER CERVICAL SPINE: Unchanged partially empty sella. No
suspicious calvarial bone marrow signal. Craniocervical junction
maintained.

SINUSES/ORBITS: Trace paranasal sinus mucosal thickening without
air-fluid levels. The included ocular globes and orbital contents
are non-suspicious. Status post bilateral ocular lens implants.

OTHER: None.
IMPRESSION: No acute intracranial process.

Similar atrophy, and disproportionate midbrain and tectal volume
loss is nonspecific though, can be seen with supranuclear palsy.

Old small anterior posterior circulation infarcts. Moderate chronic
small vessel ischemic disease.
